# Patient Record
Sex: Male | Born: 1945 | Race: White | Hispanic: No | Smoking: Never smoker
Health system: Southern US, Community
[De-identification: ages and names within clinical notes are randomized; demographics above are authoritative.]

## PROBLEM LIST (undated history)

## (undated) DIAGNOSIS — F419 Anxiety disorder, unspecified: Secondary | ICD-10-CM

## (undated) DIAGNOSIS — Z9229 Personal history of other drug therapy: Secondary | ICD-10-CM

## (undated) DIAGNOSIS — Z8551 Personal history of malignant neoplasm of bladder: Secondary | ICD-10-CM

## (undated) DIAGNOSIS — T8859XA Other complications of anesthesia, initial encounter: Secondary | ICD-10-CM

## (undated) DIAGNOSIS — F32A Depression, unspecified: Secondary | ICD-10-CM

## (undated) DIAGNOSIS — I10 Essential (primary) hypertension: Secondary | ICD-10-CM

## (undated) DIAGNOSIS — Z8546 Personal history of malignant neoplasm of prostate: Secondary | ICD-10-CM

## (undated) HISTORY — DX: Personal history of malignant neoplasm of prostate: Z85.46

## (undated) HISTORY — DX: Personal history of malignant neoplasm of bladder: Z85.51

## (undated) HISTORY — DX: Anxiety disorder, unspecified: F41.9

## (undated) HISTORY — DX: Essential (primary) hypertension: I10

## (undated) HISTORY — PX: SKIN LESION EXCISION: SHX2412

## (undated) HISTORY — DX: Personal history of other drug therapy: Z92.29

## (undated) HISTORY — PX: AORTIC VALVE REPLACEMENT: SHX41

## (undated) HISTORY — DX: Depression, unspecified: F32.A

---
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
## 1978-11-01 HISTORY — PX: KNEE SURGERY: SHX244

## 1980-11-01 HISTORY — PX: KNEE SURGERY: SHX244

## 1981-11-01 HISTORY — PX: BLADDER SURGERY: SHX569

## 1990-11-01 HISTORY — PX: OTHER SURGICAL HISTORY: SHX169

## 2007-11-02 DIAGNOSIS — Z8673 Personal history of transient ischemic attack (TIA), and cerebral infarction without residual deficits: Secondary | ICD-10-CM

## 2007-11-02 HISTORY — DX: Personal history of transient ischemic attack (TIA), and cerebral infarction without residual deficits: Z86.73

## 2010-07-07 DIAGNOSIS — I38 Endocarditis, valve unspecified: Secondary | ICD-10-CM | POA: Insufficient documentation

## 2010-07-07 DIAGNOSIS — E782 Mixed hyperlipidemia: Secondary | ICD-10-CM | POA: Insufficient documentation

## 2010-07-07 DIAGNOSIS — K219 Gastro-esophageal reflux disease without esophagitis: Secondary | ICD-10-CM | POA: Insufficient documentation

## 2010-07-07 DIAGNOSIS — E785 Hyperlipidemia, unspecified: Secondary | ICD-10-CM | POA: Insufficient documentation

## 2011-03-22 DIAGNOSIS — Z7901 Long term (current) use of anticoagulants: Secondary | ICD-10-CM | POA: Insufficient documentation

## 2011-03-22 HISTORY — DX: Long term (current) use of anticoagulants: Z79.01

## 2015-06-30 DIAGNOSIS — J309 Allergic rhinitis, unspecified: Secondary | ICD-10-CM | POA: Insufficient documentation

## 2015-07-23 DIAGNOSIS — Z8673 Personal history of transient ischemic attack (TIA), and cerebral infarction without residual deficits: Secondary | ICD-10-CM | POA: Insufficient documentation

## 2016-09-19 DIAGNOSIS — R1310 Dysphagia, unspecified: Secondary | ICD-10-CM | POA: Insufficient documentation

## 2020-02-07 DIAGNOSIS — N529 Male erectile dysfunction, unspecified: Secondary | ICD-10-CM | POA: Insufficient documentation

## 2020-05-09 DIAGNOSIS — J45909 Unspecified asthma, uncomplicated: Secondary | ICD-10-CM | POA: Insufficient documentation

## 2020-10-27 DIAGNOSIS — F419 Anxiety disorder, unspecified: Secondary | ICD-10-CM | POA: Insufficient documentation

## 2021-03-09 ENCOUNTER — Other Ambulatory Visit: Payer: Self-pay

## 2021-03-09 ENCOUNTER — Ambulatory Visit (INDEPENDENT_AMBULATORY_CARE_PROVIDER_SITE_OTHER): Payer: Medicare Other | Admitting: Nurse Practitioner

## 2021-03-09 ENCOUNTER — Encounter: Payer: Self-pay | Admitting: Nurse Practitioner

## 2021-03-09 VITALS — BP 124/70 | HR 93 | Temp 96.9°F | Ht 67.5 in | Wt 176.0 lb

## 2021-03-09 DIAGNOSIS — Z8546 Personal history of malignant neoplasm of prostate: Secondary | ICD-10-CM

## 2021-03-09 DIAGNOSIS — Z952 Presence of prosthetic heart valve: Secondary | ICD-10-CM

## 2021-03-09 DIAGNOSIS — Z9229 Personal history of other drug therapy: Secondary | ICD-10-CM | POA: Diagnosis not present

## 2021-03-09 DIAGNOSIS — I712 Thoracic aortic aneurysm, without rupture: Secondary | ICD-10-CM

## 2021-03-09 DIAGNOSIS — H9312 Tinnitus, left ear: Secondary | ICD-10-CM

## 2021-03-09 DIAGNOSIS — I2510 Atherosclerotic heart disease of native coronary artery without angina pectoris: Secondary | ICD-10-CM

## 2021-03-09 DIAGNOSIS — Z85820 Personal history of malignant melanoma of skin: Secondary | ICD-10-CM

## 2021-03-09 DIAGNOSIS — I2583 Coronary atherosclerosis due to lipid rich plaque: Secondary | ICD-10-CM

## 2021-03-09 DIAGNOSIS — E782 Mixed hyperlipidemia: Secondary | ICD-10-CM

## 2021-03-09 DIAGNOSIS — Z7901 Long term (current) use of anticoagulants: Secondary | ICD-10-CM | POA: Diagnosis not present

## 2021-03-09 DIAGNOSIS — D509 Iron deficiency anemia, unspecified: Secondary | ICD-10-CM

## 2021-03-09 DIAGNOSIS — F1020 Alcohol dependence, uncomplicated: Secondary | ICD-10-CM

## 2021-03-09 DIAGNOSIS — I7121 Aneurysm of the ascending aorta, without rupture: Secondary | ICD-10-CM

## 2021-03-09 DIAGNOSIS — H9110 Presbycusis, unspecified ear: Secondary | ICD-10-CM

## 2021-03-09 DIAGNOSIS — K219 Gastro-esophageal reflux disease without esophagitis: Secondary | ICD-10-CM

## 2021-03-09 DIAGNOSIS — Z8551 Personal history of malignant neoplasm of bladder: Secondary | ICD-10-CM

## 2021-03-09 LAB — POCT INR: INR: 2.6 (ref 2.0–3.0)

## 2021-03-09 NOTE — Patient Instructions (Addendum)
[facility] Address: [address], [place], [place] [address] [phone]  [facility] in [place], [place] Address: [address], [place], [place] [address] Phone: [phone]
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
## 2021-03-10 ENCOUNTER — Other Ambulatory Visit: Payer: Self-pay | Admitting: *Deleted

## 2021-03-10 MED ORDER — LORAZEPAM 0.5 MG PO TABS: 0.5000 mg | ORAL_TABLET | ORAL | 0 refills | Status: DC

## 2021-03-10 MED ORDER — SERTRALINE HCL 100 MG PO TABS: 100.0000 mg | ORAL_TABLET | Freq: Two times a day (BID) | ORAL | 1 refills | Status: DC

## 2021-03-10 MED ORDER — LOVASTATIN 20 MG PO TABS: 20.0000 mg | ORAL_TABLET | Freq: Every day | ORAL | 1 refills | Status: DC

## 2021-03-10 MED ORDER — RAMIPRIL 10 MG PO CAPS: 10.0000 mg | ORAL_CAPSULE | Freq: Two times a day (BID) | ORAL | 1 refills | Status: DC

## 2021-03-10 MED ORDER — AMLODIPINE BESYLATE 5 MG PO TABS: 5.0000 mg | ORAL_TABLET | Freq: Every day | ORAL | 1 refills | Status: DC

## 2021-03-10 MED ORDER — WARFARIN SODIUM 1 MG PO TABS: 0.5000 mg | ORAL_TABLET | Freq: Every day | ORAL | 1 refills | Status: DC

## 2021-03-10 MED ORDER — WARFARIN SODIUM 2 MG PO TABS: 2.0000 mg | ORAL_TABLET | Freq: Every day | ORAL | 1 refills | Status: DC

## 2021-03-10 MED ORDER — PANTOPRAZOLE SODIUM 40 MG PO TBEC: 40.0000 mg | DELAYED_RELEASE_TABLET | Freq: Two times a day (BID) | ORAL | 1 refills | Status: DC

## 2021-03-10 MED ORDER — BUSPIRONE HCL 5 MG PO TABS: 5.0000 mg | ORAL_TABLET | Freq: Two times a day (BID) | ORAL | 1 refills | Status: DC

## 2021-03-10 MED ORDER — FUROSEMIDE 20 MG PO TABS: 20.0000 mg | ORAL_TABLET | Freq: Every day | ORAL | 1 refills | Status: DC

## 2021-03-10 NOTE — Telephone Encounter (Signed)
Lorazepam Last Refill per bottle is 02/10/2021

## 2021-03-10 NOTE — Telephone Encounter (Signed)
Patient requested refills on ALL medications. Stated that he has moved here from out of town and we are his new Provider.   Pended Rx's and sent to [facility] for approval.

## 2021-03-11 LAB — CBC WITH DIFFERENTIAL/PLATELET
Absolute Monocytes: 549 cells/uL (ref 200–950)
Basophils Absolute: 39 cells/uL (ref 0–200)
Basophils Relative: 0.8 %
Eosinophils Absolute: 338 cells/uL (ref 15–500)
Eosinophils Relative: 6.9 %
HCT: 28.7 % — ABNORMAL LOW (ref 38.5–50.0)
Hemoglobin: 8.6 g/dL — ABNORMAL LOW (ref 13.2–17.1)
Lymphs Abs: 730 cells/uL — ABNORMAL LOW (ref 850–3900)
MCH: 23.7 pg — ABNORMAL LOW (ref 27.0–33.0)
MCHC: 30 g/dL — ABNORMAL LOW (ref 32.0–36.0)
MCV: 79.1 fL — ABNORMAL LOW (ref 80.0–100.0)
MPV: 10.4 fL (ref 7.5–12.5)
Monocytes Relative: 11.2 %
Neutro Abs: 3244 cells/uL (ref 1500–7800)
Neutrophils Relative %: 66.2 %
Platelets: 327 10*3/uL (ref 140–400)
RBC: 3.63 10*6/uL — ABNORMAL LOW (ref 4.20–5.80)
RDW: 15.5 % — ABNORMAL HIGH (ref 11.0–15.0)
Total Lymphocyte: 14.9 %
WBC: 4.9 10*3/uL (ref 3.8–10.8)

## 2021-03-11 LAB — IRON,TIBC AND FERRITIN PANEL
%SAT: 3 % (calc) — ABNORMAL LOW (ref 20–48)
Ferritin: 11 ng/mL — ABNORMAL LOW (ref 24–380)
Iron: 14 ug/dL — ABNORMAL LOW (ref 50–180)
TIBC: 471 mcg/dL (calc) — ABNORMAL HIGH (ref 250–425)

## 2021-03-11 LAB — COMPLETE METABOLIC PANEL WITH GFR
AG Ratio: 1.4 (calc) (ref 1.0–2.5)
ALT: 11 U/L (ref 9–46)
AST: 20 U/L (ref 10–35)
Albumin: 3.6 g/dL (ref 3.6–5.1)
Alkaline phosphatase (APISO): 89 U/L (ref 35–144)
BUN/Creatinine Ratio: 11 (calc) (ref 6–22)
BUN: 15 mg/dL (ref 7–25)
CO2: 26 mmol/L (ref 20–32)
Calcium: 8.7 mg/dL (ref 8.6–10.3)
Chloride: 106 mmol/L (ref 98–110)
Creat: 1.37 mg/dL — ABNORMAL HIGH (ref 0.70–1.18)
GFR, Est African American: 58 mL/min/{1.73_m2} — ABNORMAL LOW (ref >=60)
GFR, Est Non African American: 50 mL/min/{1.73_m2} — ABNORMAL LOW (ref >=60)
Globulin: 2.5 g/dL (calc) (ref 1.9–3.7)
Glucose, Bld: 84 mg/dL (ref 65–139)
Potassium: 4.1 mmol/L (ref 3.5–5.3)
Sodium: 139 mmol/L (ref 135–146)
Total Bilirubin: 0.3 mg/dL (ref 0.2–1.2)
Total Protein: 6.1 g/dL (ref 6.1–8.1)

## 2021-03-11 LAB — TEST AUTHORIZATION

## 2021-03-11 LAB — LIPID PANEL
Cholesterol: 185 mg/dL (ref <200)
HDL: 67 mg/dL (ref >=40)
LDL Cholesterol (Calc): 91 mg/dL (calc)
Non-HDL Cholesterol (Calc): 118 mg/dL (calc) (ref <130)
Total CHOL/HDL Ratio: 2.8 (calc) (ref <5.0)
Triglycerides: 171 mg/dL — ABNORMAL HIGH (ref <150)

## 2021-03-16 ENCOUNTER — Other Ambulatory Visit: Payer: Self-pay

## 2021-03-16 DIAGNOSIS — D509 Iron deficiency anemia, unspecified: Secondary | ICD-10-CM

## 2021-03-16 DIAGNOSIS — R790 Abnormal level of blood mineral: Secondary | ICD-10-CM

## 2021-03-16 MED ORDER — IRON (FERROUS SULFATE) 325 (65 FE) MG PO TABS: 325.0000 mg | ORAL_TABLET | Freq: Two times a day (BID) | ORAL | 3 refills | Status: DC

## 2021-03-27 ENCOUNTER — Observation Stay (HOSPITAL_COMMUNITY): Payer: Medicare Other

## 2021-03-27 ENCOUNTER — Emergency Department (HOSPITAL_COMMUNITY): Payer: Medicare Other

## 2021-03-27 ENCOUNTER — Observation Stay (HOSPITAL_COMMUNITY)
Admission: EM | Admit: 2021-03-27 | Discharge: 2021-03-29 | Disposition: A | Discharge status: Home / Self Care | Payer: Medicare Other | Attending: Emergency Medicine | Admitting: Emergency Medicine

## 2021-03-27 ENCOUNTER — Other Ambulatory Visit: Payer: Self-pay

## 2021-03-27 ENCOUNTER — Encounter (HOSPITAL_COMMUNITY): Payer: Self-pay | Admitting: *Deleted

## 2021-03-27 DIAGNOSIS — K922 Gastrointestinal hemorrhage, unspecified: Secondary | ICD-10-CM | POA: Diagnosis present

## 2021-03-27 DIAGNOSIS — Z79899 Other long term (current) drug therapy: Secondary | ICD-10-CM | POA: Diagnosis not present

## 2021-03-27 DIAGNOSIS — K449 Diaphragmatic hernia without obstruction or gangrene: Secondary | ICD-10-CM | POA: Diagnosis not present

## 2021-03-27 DIAGNOSIS — Z8551 Personal history of malignant neoplasm of bladder: Secondary | ICD-10-CM | POA: Insufficient documentation

## 2021-03-27 DIAGNOSIS — F1020 Alcohol dependence, uncomplicated: Secondary | ICD-10-CM

## 2021-03-27 DIAGNOSIS — I447 Left bundle-branch block, unspecified: Secondary | ICD-10-CM | POA: Diagnosis not present

## 2021-03-27 DIAGNOSIS — S32010A Wedge compression fracture of first lumbar vertebra, initial encounter for closed fracture: Secondary | ICD-10-CM

## 2021-03-27 DIAGNOSIS — D509 Iron deficiency anemia, unspecified: Secondary | ICD-10-CM | POA: Diagnosis not present

## 2021-03-27 DIAGNOSIS — I38 Endocarditis, valve unspecified: Secondary | ICD-10-CM

## 2021-03-27 DIAGNOSIS — S22080A Wedge compression fracture of T11-T12 vertebra, initial encounter for closed fracture: Secondary | ICD-10-CM

## 2021-03-27 DIAGNOSIS — J45909 Unspecified asthma, uncomplicated: Secondary | ICD-10-CM | POA: Diagnosis not present

## 2021-03-27 DIAGNOSIS — R531 Weakness: Secondary | ICD-10-CM

## 2021-03-27 DIAGNOSIS — Z7901 Long term (current) use of anticoagulants: Secondary | ICD-10-CM | POA: Insufficient documentation

## 2021-03-27 DIAGNOSIS — Z20822 Contact with and (suspected) exposure to covid-19: Secondary | ICD-10-CM | POA: Insufficient documentation

## 2021-03-27 DIAGNOSIS — K2100 Gastro-esophageal reflux disease with esophagitis, without bleeding: Secondary | ICD-10-CM | POA: Insufficient documentation

## 2021-03-27 DIAGNOSIS — Z952 Presence of prosthetic heart valve: Secondary | ICD-10-CM

## 2021-03-27 DIAGNOSIS — Z8546 Personal history of malignant neoplasm of prostate: Secondary | ICD-10-CM | POA: Insufficient documentation

## 2021-03-27 DIAGNOSIS — D62 Acute posthemorrhagic anemia: Secondary | ICD-10-CM

## 2021-03-27 DIAGNOSIS — K3189 Other diseases of stomach and duodenum: Secondary | ICD-10-CM | POA: Insufficient documentation

## 2021-03-27 DIAGNOSIS — I10 Essential (primary) hypertension: Secondary | ICD-10-CM | POA: Diagnosis not present

## 2021-03-27 DIAGNOSIS — K317 Polyp of stomach and duodenum: Secondary | ICD-10-CM | POA: Diagnosis not present

## 2021-03-27 DIAGNOSIS — I700 Atherosclerosis of aorta: Secondary | ICD-10-CM

## 2021-03-27 HISTORY — DX: Other complications of anesthesia, initial encounter: T88.59XA

## 2021-03-27 HISTORY — DX: Presence of prosthetic heart valve: Z95.2

## 2021-03-27 LAB — URINALYSIS, ROUTINE W REFLEX MICROSCOPIC
Bilirubin Urine: NEGATIVE
Glucose, UA: NEGATIVE mg/dL
Hgb urine dipstick: NEGATIVE
Ketones, ur: NEGATIVE mg/dL
Leukocytes,Ua: NEGATIVE
Nitrite: NEGATIVE
Protein, ur: NEGATIVE mg/dL
Specific Gravity, Urine: 1.005 (ref 1.005–1.030)
pH: 6 (ref 5.0–8.0)

## 2021-03-27 LAB — CBC WITH DIFFERENTIAL/PLATELET
Abs Immature Granulocytes: 0.03 10*3/uL (ref 0.00–0.07)
Basophils Absolute: 0.1 10*3/uL (ref 0.0–0.1)
Basophils Relative: 1 %
Eosinophils Absolute: 0.4 10*3/uL (ref 0.0–0.5)
Eosinophils Relative: 5 %
HCT: 26.5 % — ABNORMAL LOW (ref 39.0–52.0)
Hemoglobin: 7.7 g/dL — ABNORMAL LOW (ref 13.0–17.0)
Immature Granulocytes: 0 %
Lymphocytes Relative: 10 %
Lymphs Abs: 0.9 10*3/uL (ref 0.7–4.0)
MCH: 22.4 pg — ABNORMAL LOW (ref 26.0–34.0)
MCHC: 29.1 g/dL — ABNORMAL LOW (ref 30.0–36.0)
MCV: 77.3 fL — ABNORMAL LOW (ref 80.0–100.0)
Monocytes Absolute: 0.8 10*3/uL (ref 0.1–1.0)
Monocytes Relative: 9 %
Neutro Abs: 6.7 10*3/uL (ref 1.7–7.7)
Neutrophils Relative %: 75 %
Platelets: 302 10*3/uL (ref 150–400)
RBC: 3.43 MIL/uL — ABNORMAL LOW (ref 4.22–5.81)
RDW: 16.8 % — ABNORMAL HIGH (ref 11.5–15.5)
WBC: 8.9 10*3/uL (ref 4.0–10.5)
nRBC: 0 % (ref 0.0–0.2)

## 2021-03-27 LAB — COMPREHENSIVE METABOLIC PANEL
ALT: 10 U/L (ref 0–44)
AST: 17 U/L (ref 15–41)
Albumin: 3.3 g/dL — ABNORMAL LOW (ref 3.5–5.0)
Alkaline Phosphatase: 78 U/L (ref 38–126)
Anion gap: 5 (ref 5–15)
BUN: 20 mg/dL (ref 8–23)
CO2: 25 mmol/L (ref 22–32)
Calcium: 8.9 mg/dL (ref 8.9–10.3)
Chloride: 107 mmol/L (ref 98–111)
Creatinine, Ser: 1.37 mg/dL — ABNORMAL HIGH (ref 0.61–1.24)
GFR, Estimated: 54 mL/min — ABNORMAL LOW (ref >60)
Glucose, Bld: 104 mg/dL — ABNORMAL HIGH (ref 70–99)
Potassium: 4.1 mmol/L (ref 3.5–5.1)
Sodium: 137 mmol/L (ref 135–145)
Total Bilirubin: 0.5 mg/dL (ref 0.3–1.2)
Total Protein: 6.7 g/dL (ref 6.5–8.1)

## 2021-03-27 LAB — PROTIME-INR
INR: 1.3 — ABNORMAL HIGH (ref 0.8–1.2)
Prothrombin Time: 15.9 seconds — ABNORMAL HIGH (ref 11.4–15.2)

## 2021-03-27 LAB — RESP PANEL BY RT-PCR (FLU A&B, COVID) ARPGX2
Influenza A by PCR: NEGATIVE
Influenza B by PCR: NEGATIVE
SARS Coronavirus 2 by RT PCR: NEGATIVE

## 2021-03-27 LAB — POC OCCULT BLOOD, ED: Fecal Occult Bld: NEGATIVE

## 2021-03-27 MED ORDER — ACETAMINOPHEN 650 MG RE SUPP: 650.0000 mg | Freq: Four times a day (QID) | RECTAL | Status: DC | PRN

## 2021-03-27 MED ORDER — ACETAMINOPHEN 325 MG PO TABS
650.0000 mg | ORAL_TABLET | Freq: Four times a day (QID) | ORAL | Status: DC | PRN
  Filled 2021-03-27: qty 2

## 2021-03-27 MED ORDER — ONDANSETRON HCL 4 MG/2ML IJ SOLN: 4.0000 mg | Freq: Four times a day (QID) | INTRAMUSCULAR | Status: DC | PRN

## 2021-03-27 MED ORDER — SODIUM CHLORIDE 0.9 % IV SOLN
80.0000 mg | Freq: Once | INTRAVENOUS | Status: AC
  Administered 2021-03-27: 80 mg via INTRAVENOUS
  Filled 2021-03-27: qty 80

## 2021-03-27 MED ORDER — SODIUM CHLORIDE 0.9 % IV SOLN: INTRAVENOUS | Status: AC

## 2021-03-27 MED ORDER — THIAMINE HCL 100 MG PO TABS
100.0000 mg | ORAL_TABLET | Freq: Every day | ORAL | Status: DC
  Administered 2021-03-28 – 2021-03-29 (×2): 100 mg via ORAL
  Filled 2021-03-27 (×2): qty 1

## 2021-03-27 MED ORDER — ADULT MULTIVITAMIN W/MINERALS CH
1.0000 | ORAL_TABLET | Freq: Every day | ORAL | Status: DC
  Administered 2021-03-27 – 2021-03-29 (×3): 1 via ORAL
  Filled 2021-03-27 (×3): qty 1

## 2021-03-27 MED ORDER — LORAZEPAM 2 MG/ML IJ SOLN
1.0000 mg | INTRAMUSCULAR | Status: DC | PRN
  Administered 2021-03-27 – 2021-03-28 (×2): 1 mg via INTRAVENOUS
  Filled 2021-03-27 (×2): qty 1

## 2021-03-27 MED ORDER — THIAMINE HCL 100 MG/ML IJ SOLN
100.0000 mg | Freq: Every day | INTRAMUSCULAR | Status: DC
  Administered 2021-03-27: 100 mg via INTRAVENOUS
  Filled 2021-03-27: qty 2

## 2021-03-27 MED ORDER — PANTOPRAZOLE SODIUM 40 MG IV SOLR
8.0000 mg/h | INTRAVENOUS | Status: DC
  Administered 2021-03-27 – 2021-03-29 (×3): 8 mg/h via INTRAVENOUS
  Filled 2021-03-27 (×6): qty 80

## 2021-03-27 MED ORDER — FOLIC ACID 1 MG PO TABS
1.0000 mg | ORAL_TABLET | Freq: Every day | ORAL | Status: DC
  Administered 2021-03-27 – 2021-03-29 (×3): 1 mg via ORAL
  Filled 2021-03-27 (×3): qty 1

## 2021-03-27 MED ORDER — ONDANSETRON HCL 4 MG/2ML IJ SOLN
4.0000 mg | Freq: Once | INTRAMUSCULAR | Status: AC
Start: 2021-03-27 — End: 2021-03-27
  Administered 2021-03-27: 4 mg via INTRAVENOUS
  Filled 2021-03-27: qty 2

## 2021-03-27 MED ORDER — LORAZEPAM 1 MG PO TABS: 1.0000 mg | ORAL_TABLET | ORAL | Status: DC | PRN

## 2021-03-27 NOTE — ED Notes (Signed)
Pt given a note from family member that was left up front. Pt now upset after reading note as it stated that his family was unable to come see him per instructions from [facility]. This nurse contacted pts family member [name] to have her speak with pt as he was stating " Im not going to stay in the hospital if [facility] wont let my person come see me. They're the only one I have." Nurse tried to reassure pt and had phone call transferred in to the room.

## 2021-03-27 NOTE — ED Triage Notes (Signed)
75 yo male BIBA from facility [facility] for ETOH on Monday. Per EMS facility did blood work that showed low hbg/ hct. Per Ems facility states pts hbg level was 6.6, pt has history of blood transfusions. Pt currently exhibiting fatigue and n/v. No other complaints at this time. Pt is AOx4.   Vitals: 116/72 Hr 100 rr 16 spo2 98% ra cbg 176 gcs 15

## 2021-03-27 NOTE — ED Notes (Signed)
Pt transported to CT ?

## 2021-03-27 NOTE — ED Notes (Signed)
Pt ambulated to restroom without assistance.

## 2021-03-27 NOTE — Consult Note (Signed)
Referring Provider: [facility] Primary Care Physician:  [name], NP Primary Gastroenterologist: None (unassigned).  Has been seen previously by GI physicians in [place].  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  Notably, stool was Hemoccult negative in the emergency room.  The patient indicates he had a gastric mass resected many years ago in [place], apparently causing bleeding at that time.  He also has a history of "bleeding ulcer) but it sounds like that is actually the mass mentioned above that was bleeding.  He has had mild dyspeptic symptoms recently and uses occasional antacids.  As mentioned, he has a tendency for nocturnal regurgitation without antecedent nausea.  He really has not had nausea and vomiting.  He is maintained on Coumadin because of a history of stroke, although he was actually subtherapeutic with an essentially normal INR at the time of presentation to the emergency room today.  There is no known history of liver disease and the patient does not have any abnormal labs to suggest underlying liver disease.  The patient's CT scan today shows a normal liver and normal spleen without ascites or varices.  It did, however, show a moderate sized hiatal hernia.  The patient indicates he had a colonoscopy in [place] about 5 years ago, apparently without any significant findings.  He does not recall any specific follow-up interval being recommended  subsequent to that exam.  No problem with anorexia or progressive weight loss.  He does admit to mild intermittent dysphagia symptoms but no history of prolonged food impactions.  No lower tract symptoms, no history of dark stools or rectal bleeding.   Past Medical History:  Diagnosis Date  . Anxiety   . Depression   . History of bladder cancer   . History of pneumonia 1959  . History of prostate cancer   . History of scarlet fever 1956  . History of stroke 2009  . Hx of long term use of blood thinners   . Hypertension     Past Surgical History:  Procedure Laterality Date  . [place]   Bladder Cancer Surgery   . KNEE SURGERY Right 1980  . KNEE SURGERY Left 1982  . OTHER SURGICAL HISTORY  1992   stomach muscles removed due to Coumadin caused bleeding.  . TONSILLECTOMY  1965    Prior to Admission medications   Medication Sig Start Date End Date Taking? Authorizing Provider  acetaminophen (TYLENOL) 500 MG tablet Take 500 mg by mouth as needed.    [provider]  amLODipine (NORVASC) 5 MG tablet Take 1 tablet (5 mg total) by mouth daily. 03/10/21   [name], NP  busPIRone (BUSPAR) 5 MG tablet Take 1 tablet (5 mg total) by mouth 2 (two) times daily. 03/10/21   [name], NP  cetirizine (ZYRTEC) 10 MG tablet Take 10 mg by mouth daily.    [provider]  furosemide (LASIX) 20 MG tablet Take 1 tablet (20 mg total) by mouth daily. 03/10/21  [name], NP  Iron, Ferrous Sulfate, 325 (65 Fe) MG TABS Take 325 mg by mouth in the morning and at bedtime. 03/16/21   [name], NP  LORazepam (ATIVAN) 0.5 MG tablet Take 1 tablet (0.5 mg total) by mouth as directed. One tablet in morning, and two tablets at night. 03/10/21   [name], NP  lovastatin (MEVACOR) 20 MG tablet Take 1 tablet (20 mg total) by mouth at bedtime. 03/10/21   [name], NP  pantoprazole (PROTONIX) 40 MG tablet Take 1 tablet (40 mg total) by  mouth 2 (two) times daily. 03/10/21   [name], NP  ramipril (ALTACE) 10 MG capsule Take 1 capsule (10 mg total) by mouth 2 (two) times daily. 03/10/21   [name], NP  sertraline (ZOLOFT) 100 MG tablet Take 1 tablet (100 mg total) by mouth 2 (two) times daily. 03/10/21   [name], NP  warfarin (COUMADIN) 1 MG tablet Take 0.5 tablets (0.5 mg total) by mouth daily. 03/10/21   [name], NP  warfarin (COUMADIN) 2 MG tablet Take 1 tablet (2 mg total) by mouth daily. Along with 1/2 of 1 mg tablet for a total of 2.5 mg daily 03/10/21   [name], NP    Current Facility-Administered Medications  Medication Dose Route Frequency Provider Last Rate Last Admin  . pantoprazole (PROTONIX) 80 mg in sodium chloride 0.9 % 100 mL (0.8 mg/mL) infusion  8 mg/hr Intravenous Continuous Couture, Cortni S, PA-C 10 mL/hr at 03/27/21 1602 8 mg/hr at 03/27/21 1602   Current Outpatient Medications  Medication Sig Dispense Refill  . acetaminophen (TYLENOL) 500 MG tablet Take 500 mg by mouth as needed.    Marland Kitchen amLODipine (NORVASC) 5 MG tablet Take 1 tablet (5 mg total) by mouth daily. 90 tablet 1  . busPIRone (BUSPAR) 5 MG tablet Take 1 tablet (5 mg total) by mouth 2 (two) times daily. 180 tablet 1  . cetirizine (ZYRTEC) 10 MG tablet Take 10 mg by mouth daily.    . furosemide (LASIX) 20 MG tablet Take 1 tablet (20 mg total) by mouth daily. 90 tablet 1  . Iron, Ferrous Sulfate, 325 (65 Fe) MG TABS Take 325 mg by mouth in the morning and at bedtime. 180 tablet 3  . LORazepam (ATIVAN) 0.5 MG tablet Take 1 tablet (0.5 mg total) by mouth as directed. One tablet in morning, and two tablets at night. 90 tablet 0  . lovastatin (MEVACOR) 20 MG tablet Take 1 tablet (20 mg total) by mouth at bedtime. 90 tablet 1  . pantoprazole (PROTONIX) 40 MG tablet Take 1 tablet (40 mg total) by mouth 2 (two) times daily. 180 tablet 1  . ramipril (ALTACE) 10 MG capsule Take 1 capsule (10 mg total) by mouth 2  (two) times daily. 180 capsule 1  . sertraline (ZOLOFT) 100 MG tablet Take 1 tablet (100 mg total) by mouth 2 (two) times daily. 180 tablet 1  . warfarin (COUMADIN) 1 MG tablet Take 0.5 tablets (0.5 mg total) by mouth daily. 45 tablet 1  . warfarin (COUMADIN) 2 MG tablet Take 1 tablet (2 mg total) by mouth daily. Along with 1/2 of 1 mg tablet for a total of 2.5 mg daily 90 tablet 1    Allergies as of 03/27/2021 - Review Complete 03/27/2021  Allergen Reaction Noted  . Aspirin  10/13/2017  . Morphine and related Other (See Comments) 10/13/2017  . Promethazine Other (See Comments) 10/13/2017  History reviewed. No pertinent family history.  Social History   Socioeconomic History  . Marital status: Widowed    Spouse name: Not on file  . Number of children: Not on file  . Years of education: Not on file  . Highest education level: Not on file  Occupational History  . Not on file  Tobacco Use  . Smoking status: Never Smoker  . Smokeless tobacco: Current User    Types: Snuff  . Tobacco comment: 4-5 pouches a day  Vaping Use  . Vaping Use: Never used  Substance and Sexual Activity  . Alcohol use: Yes    Comment: 15-20 drinks per week.  . Drug use: Never  . Sexual activity: Not on file  Other Topics Concern  . Not on file  Social History Narrative   Tobacco use, amount per day now: Dip 4 plugs per day.   Past tobacco use, amount per day:   How many years did you use tobacco: 40   Alcohol use (drinks per week): 15-25 drinks.   Diet: poor   Do you drink/eat things with caffeine: Yes   Marital status: Widowed.                                 What year were you married? 1973   Do you live in a house, apartment, assisted living, condo, trailer, etc.? House   Is it one or more stories? One   How many persons live in your home? Three   Do you have pets in your home?( please list) No   Highest Level of education completed? PhD Candidate.   Current or past profession: Tourist information centre manager,  Principle, and Leisure centre manager.   Do you exercise?  No                                Type and how often?   Do you have a living will? Yes   Do you have a DNR form? Yes                                  If not, do you want to discuss one?   Do you have signed POA/HPOA forms?  Yes                      If so, please bring to you appointment      Do you have any difficulty bathing or dressing yourself?    Do you have any difficulty preparing food or eating?   Do you have any difficulty managing your medications?   Do you have any difficulty managing your finances?   Do you have any difficulty affording your medications?    Social Determinants of Health   Financial Resource Strain: Not on file  Food Insecurity: Not on file  Transportation Needs: Not on file  Physical Activity: Not on file  Stress: Not on file  Social Connections: Not on file  Intimate Partner Violence: Not on file    Review of Systems: See HPI  Physical Exam: Vital signs in last 24 hours: Temp:  [99.1 F (37.3 C)] 99.1 F (37.3 C) (05/27 1439) Pulse Rate:  [75-95] 87 (05/27 1900) Resp:  [16-21] 17 (05/27 1900) BP: (101-139)/(54-92) 132/69 (05/27 1900) SpO2:  [92 %-100 %] 100 % (  05/27 1900)   Physical exam is pertinent for the absence of stigmata of chronic liver disease such as scleral icterus, palmar erythema, spider angiomata, hepatosplenomegaly, evidence of ascites, or peripheral edema.  Mental status is normal, mood normal.  Chest clear, heart without murmur or arrhythmia, abdomen without mass or tenderness although a well-healed midline incision without ventral herniation is present.  No focal neurologic abnormalities evident.   Intake/Output from previous day: No intake/output data recorded. Intake/Output this shift: No intake/output data recorded.  Lab Results: Recent Labs    03/27/21 1532  WBC 8.9  HGB 7.7*  HCT 26.5*  PLT 302   BMET Recent Labs    03/27/21 1532  NA 137  K 4.1  CL 107  CO2 25   GLUCOSE 104*  BUN 20  CREATININE 1.37*  CALCIUM 8.9   LFT Recent Labs    03/27/21 1532  PROT 6.7  ALBUMIN 3.3*  AST 17  ALT 10  ALKPHOS 78  BILITOT 0.5   PT/INR Recent Labs    03/27/21 1532  LABPROT 15.9*  INR 1.3*    Studies/Results: CT ABDOMEN PELVIS WO CONTRAST  Result Date: 03/27/2021 CLINICAL DATA:  Low hemoglobin, hematocrit, history of blood transfusions, fatigue and nausea vomiting EXAM: CT ABDOMEN AND PELVIS WITHOUT CONTRAST TECHNIQUE: Multidetector CT imaging of the abdomen and pelvis was performed following the standard protocol without IV contrast. COMPARISON:  None. FINDINGS: Lower chest: Few clustered tree-in-bud opacities are seen in the right lower lobe (4/32) and right middle lobe (4/1). Calcified granuloma in the left lung base (4/19). Top-normal cardiac size. Calcifications of the mitral annulus and chordae tendinae. Hypoattenuation of the cardiac blood pool may reflect a relative anemia compatible patient's low hemoglobin/hematocrit. Atherosclerotic calcifications of the coronary arteries. Hepatobiliary: Punctate calcifications noted within the hepatic parenchyma. Smooth surface contour. No concerning focal liver lesion. Likely physiologic distension of the gallbladder. No pericholecystic fluid or inflammation. No visible calcified gallstones or biliary ductal dilatation. Pancreas: No pancreatic ductal dilatation or surrounding inflammatory changes. Spleen: Punctate calcifications throughout the spleen. Splenic size is within normal limits. No concerning focal splenic lesion. Adrenals/Urinary Tract: Normal adrenals. Kidneys are symmetric in size and normally located. Tiny subcentimeter 5 mm rounded subcapsular focus on the anterior interpolar left kidney (2/33) too small to fully characterize though statistically likely benign. No obstructive urolithiasis or hydronephrosis. Mild bilateral perinephric stranding is nonspecific particularly given some mild bladder wall  thickening. Keyhole defect at the level of the bladder base may reflect prior TURP or other postsurgical change. Stomach/Bowel: Moderate hiatal hernia despite postsurgical changes at the diaphragmatic hiatus likely reflecting prior hernia surgery. Surgical clips noted towards the gastric antrum and upper midline abdomen as well. Stomach and duodenum are otherwise unremarkable. No small bowel thickening or dilatation accounting for underdistention. Appendix is not visualized. No focal inflammation the vicinity of the cecum to suggest an occult appendicitis. Diffuse pancolonic and terminal ileal diverticulosis. Focally thickened segment of sigmoid colon is noted without significant colonic stranding (5/74). In a region of numerous diverticula. No evidence of bowel obstruction. Vascular/Lymphatic: Atherosclerotic calcifications within the abdominal aorta and branch vessels. No aneurysm or ectasia. No enlarged abdominopelvic lymph nodes. Reproductive: Keyhole defect at the bladder base/prostate, correlate with postsurgical change. Prostate seminal vesicles are otherwise. Other: Fat containing bilateral inguinal hernias, left greater than right. No frank bowel containing hernia though the left inguinal hernia is closely apposed by proximal sigmoid. No free intraperitoneal air or fluid. Postsurgical changes from prior vertical midline incision. Musculoskeletal: Remote  appearing compression deformities T12 (50% height loss) and L1 (20% height loss). Multilevel degenerative changes are present in the imaged portions of the spine. Additional degenerative changes in the hips and pelvis. IMPRESSION: 1. Focally thickened segment of sigmoid colon in a region of several diverticula but without acute inflammation. Could reflect sequela of prior infection/inflammation though warrants further evaluation with direct visualization is underlying mass lesion is not fully excluded. 2. Additional diverticulosis throughout the terminal  ileum and colon. 3. Few clustered tree-in-bud opacities are present in the right middle and lower lobe, could reflect acute infection or inflammation including sequela of aspiration in the appropriate clinical setting. 4. Mild bilateral perinephric stranding and some mild bladder wall thickening, recommend correlation with urinalysis to exclude ascending tract infection. 5. Remote appearing compression deformities of T12, L1, though could correlate for local tenderness to exclude acuity. 6. Hypoattenuating cardiac blood pool compatible with anemia. 7. Evidence of prior granulomatous disease in the chest and abdomen. 8. Keyhole defect at the level of the bladder base/prostate, correlate with prior surgical history. 9. Moderate hiatal hernia despite surgical changes near the diaphragmatic hiatus. 10.  Aortic Atherosclerosis (ICD10-I70.0). Electronically Signed   By: Lovena Le M.D.   On: 03/27/2021 18:26    Impression: 1.  Regurgitation of dark material, questionable coffee-ground material.  No frank hematemesis. 2.  History of gastric mass with bleeding, status post resection many years ago in [place] 3.  Alcohol abuse, without clinical, radiographic, or biochemical evidence of significant liver disease 4.  Severe microcytic anemia with documented iron deficiency 2 weeks ago, status post colonoscopy (results unknown) 5 years ago in [place]; hemoglobin has dropped slightly from 8.6 on May 9th to 7.7 today. 5.  Chronic anticoagulation, presumably for history of stroke, although current INR is normal at 1.3 6.  Sigmoid thickening consistent with diverticular disease.  Reportedly up-to-date on colonoscopy, done about 5 years ago in [place].  Discussion: This sounds to me like chronic reflux disease.  I would not be surprised if this patient, who has a moderate sized hiatal hernia, and stopped having significant reflux esophagitis which might account for his coffee-ground emesis and/or anemia.   Alcoholic gastritis would be another possibility.  Doubt portal hypertension, varices, or portal gastropathy in view of normal platelet count and absence of stigmata of chronic liver disease and negative radiographic findings.  Plan: 1.  Agree with Protonix infusion   2.  Endoscopic evaluation tomorrow.  Petra Kuba, purpose, and risks reviewed and patient agreeable.  3.  Eventually, this patient might need colonoscopic evaluation in view of his documented iron deficiency anemia, although the Hemoccult negative stool would go against a colonic source, as would his relatively recent colonoscopy.   LOS: 0 days   Youlanda Mighty Rosa [place]  03/27/2021, 8:32 PM   Pager [phone] If no answer or after 5 PM call [phone]

## 2021-03-27 NOTE — H&P (Signed)
History and Physical    Garrett Terry UTM:[id number] DOB: 1946-10-01 DOA: 03/27/2021  PCP: [name], NP Patient coming from: [facility]  Chief Complaint: Anemia, coffee-ground emesis  HPI: Garrett Terry is a 75 y.o. 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  Patient complained of coffee-ground emesis.  In the ED, he was hemodynamically stable.  Labs showing WBC 8.9, hemoglobin 7.7, hematocrit 26.5, MCV 77.3, platelet count 302K.  Sodium 137, potassium 4.1, chloride 107, bicarb 25, BUN 20, creatinine 1.3 (at baseline), glucose 104.  INR 1.3.  FOBT negative.  Screening COVID and influenza PCR pending.  CT did not show obvious source of bleeding.  Showing focally thickened segment of sigmoid colon in the region of several diverticula but without acute inflammation.  Radiologist feels that this could reflect sequela of prior infection/inflammation though warrants further evaluation with direct visualization as underlying mass lesion is not fully excluded.  Abdominal ultrasound was done in the ED and results pending. GI Dr. Cristina Gong consulted and recommended admission for observation.  Patient was given IV Protonix bolus and started on continuous infusion.  Patient states for the past 4 days he has had 3 episodes of dark coffee-ground emesis.  This usually happens at night and he feels a lot of acid reflux.  Reports chronic bilateral lower quadrant abdominal pain which has been going on close to a year.  Denies hematemesis or melena.  Reports fatigue.  States he had an endoscopy done in another state several years ago and is not sure about the  results.  He was also previously told that he had an abnormality in his colon but does not remember what exactly it was.  He is fully vaccinated against COVID including booster shot.  Reports chronic shortness of breath and wheezing for which he uses an inhaler, no recent change.  Denies cough.  Denies chest pain.  Review of Systems:  All systems reviewed and apart from history of presenting illness, are negative.  Past Medical History:  Diagnosis Date  . Anxiety   . Depression   . History of bladder cancer   . History of pneumonia 1959  . History of prostate cancer   . History of scarlet fever 1956  . History of stroke 2009  . Hx of long term use of blood thinners   . Hypertension     Past Surgical History:  Procedure Laterality Date  . [place]   Bladder Cancer Surgery   . KNEE SURGERY Right 1980  . KNEE SURGERY Left 1982  . OTHER SURGICAL HISTORY  1992   stomach muscles removed due to Coumadin caused bleeding.  . TONSILLECTOMY  1965     reports that he has never smoked. His smokeless tobacco use includes snuff. He reports current alcohol use. He reports that he does not use drugs.  Allergies  Allergen Reactions  . Aspirin     Other reaction(s): Other (See Comments) Can't take due to taking blood thinners  . Morphine And Related Other (See Comments)    Other reaction(s): Other (See Comments) Hallucinations hallucinations   . Promethazine Other (See Comments)    Other reaction(s): Other (See Comments) Hallucinations hallucinations     History reviewed. No  pertinent family history.  Prior to Admission medications   Medication Sig Start Date End Date Taking? Authorizing Provider  acetaminophen (TYLENOL) 500 MG tablet Take 500 mg by mouth as needed.    [provider]  amLODipine (NORVASC) 5 MG tablet Take 1 tablet (5 mg total) by mouth daily. 03/10/21   [name], NP  busPIRone (BUSPAR) 5 MG tablet Take 1 tablet (5 mg total) by mouth 2  (two) times daily. 03/10/21   [name], NP  cetirizine (ZYRTEC) 10 MG tablet Take 10 mg by mouth daily.    [provider]  furosemide (LASIX) 20 MG tablet Take 1 tablet (20 mg total) by mouth daily. 03/10/21   [name], NP  Iron, Ferrous Sulfate, 325 (65 Fe) MG TABS Take 325 mg by mouth in the morning and at bedtime. 03/16/21   [name], NP  LORazepam (ATIVAN) 0.5 MG tablet Take 1 tablet (0.5 mg total) by mouth as directed. One tablet in morning, and two tablets at night. 03/10/21   [name], NP  lovastatin (MEVACOR) 20 MG tablet Take 1 tablet (20 mg total) by mouth at bedtime. 03/10/21   [name], NP  pantoprazole (PROTONIX) 40 MG tablet Take 1 tablet (40 mg total) by mouth 2 (two) times daily. 03/10/21   [name], NP  ramipril (ALTACE) 10 MG capsule Take 1 capsule (10 mg total) by mouth 2 (two) times daily. 03/10/21   [name], NP  sertraline (ZOLOFT) 100 MG tablet Take 1 tablet (100 mg total) by mouth 2 (two) times daily. 03/10/21   [name], NP  warfarin (COUMADIN) 1 MG tablet Take 0.5 tablets (0.5 mg total) by mouth daily. 03/10/21   [name], NP  warfarin (COUMADIN) 2 MG tablet Take 1 tablet (2 mg total) by mouth daily. Along with 1/2 of 1 mg tablet for a total of 2.5 mg daily 03/10/21   [name], NP    Physical Exam: Vitals:   03/27/21 1700 03/27/21 1730 03/27/21 1835 03/27/21 1900  BP: (!) 101/57 (!) 139/54 130/67 132/69  Pulse: 84 75 95 87  Resp: 20 18 18 17   Temp:      TempSrc:      SpO2: 100% 92% 100% 100%   Physical Exam Constitutional:      General: He is not in acute distress. HENT:     Head: Normocephalic and atraumatic.  Eyes:     Extraocular Movements: Extraocular movements intact.     Conjunctiva/sclera: Conjunctivae normal.  Cardiovascular:     Rate and Rhythm: Normal rate and regular rhythm.     Pulses: Normal pulses.  Pulmonary:     Effort: Pulmonary effort is  normal. No respiratory distress.     Breath sounds: Normal breath sounds. No wheezing or rales.  Abdominal:     General: Bowel sounds are normal. There is no distension.     Palpations: Abdomen is soft.     Tenderness: There is no abdominal tenderness.  Musculoskeletal:        General: No swelling or tenderness.     Cervical back: Normal range of motion and neck supple.  Skin:    General: Skin is warm and dry.  Neurological:     General: No focal deficit present.     Mental Status: He is alert and oriented to person, place, and time.     Labs on Admission: I have personally reviewed following labs and  imaging studies  CBC: Recent Labs  Lab 03/27/21 1532  WBC 8.9  NEUTROABS 6.7  HGB 7.7*  HCT 26.5*  MCV 77.3*  PLT 353   Basic Metabolic Panel: Recent Labs  Lab 03/27/21 1532  NA 137  K 4.1  CL 107  CO2 25  GLUCOSE 104*  BUN 20  CREATININE 1.37*  CALCIUM 8.9   GFR: CrCl cannot be calculated (Unknown ideal weight.). Liver Function Tests: Recent Labs  Lab 03/27/21 1532  AST 17  ALT 10  ALKPHOS 78  BILITOT 0.5  PROT 6.7  ALBUMIN 3.3*   No results for input(s): LIPASE, AMYLASE in the last 168 hours. No results for input(s): AMMONIA in the last 168 hours. Coagulation Profile: Recent Labs  Lab 03/27/21 1532  INR 1.3*   Cardiac Enzymes: No results for input(s): CKTOTAL, CKMB, CKMBINDEX, TROPONINI in the last 168 hours. BNP (last 3 results) No results for input(s): PROBNP in the last 8760 hours. HbA1C: No results for input(s): HGBA1C in the last 72 hours. CBG: No results for input(s): GLUCAP in the last 168 hours. Lipid Profile: No results for input(s): CHOL, HDL, LDLCALC, TRIG, CHOLHDL, LDLDIRECT in the last 72 hours. Thyroid Function Tests: No results for input(s): TSH, T4TOTAL, FREET4, T3FREE, THYROIDAB in the last 72 hours. Anemia Panel: No results for input(s): VITAMINB12, FOLATE, FERRITIN, TIBC, IRON, RETICCTPCT in the last 72 hours. Urine  analysis:    Component Value Date/Time   COLORURINE COLORLESS (A) 03/27/2021 1901   APPEARANCEUR CLEAR 03/27/2021 1901   LABSPEC 1.005 03/27/2021 1901   PHURINE 6.0 03/27/2021 1901   GLUCOSEU NEGATIVE 03/27/2021 1901   HGBUR NEGATIVE 03/27/2021 1901   BILIRUBINUR NEGATIVE 03/27/2021 1901   KETONESUR NEGATIVE 03/27/2021 1901   PROTEINUR NEGATIVE 03/27/2021 1901   NITRITE NEGATIVE 03/27/2021 1901   LEUKOCYTESUR NEGATIVE 03/27/2021 1901    Radiological Exams on Admission: CT ABDOMEN PELVIS WO CONTRAST  Result Date: 03/27/2021 CLINICAL DATA:  Low hemoglobin, hematocrit, history of blood transfusions, fatigue and nausea vomiting EXAM: CT ABDOMEN AND PELVIS WITHOUT CONTRAST TECHNIQUE: Multidetector CT imaging of the abdomen and pelvis was performed following the standard protocol without IV contrast. COMPARISON:  None. FINDINGS: Lower chest: Few clustered tree-in-bud opacities are seen in the right lower lobe (4/32) and right middle lobe (4/1). Calcified granuloma in the left lung base (4/19). Top-normal cardiac size. Calcifications of the mitral annulus and chordae tendinae. Hypoattenuation of the cardiac blood pool may reflect a relative anemia compatible patient's low hemoglobin/hematocrit. Atherosclerotic calcifications of the coronary arteries. Hepatobiliary: Punctate calcifications noted within the hepatic parenchyma. Smooth surface contour. No concerning focal liver lesion. Likely physiologic distension of the gallbladder. No pericholecystic fluid or inflammation. No visible calcified gallstones or biliary ductal dilatation. Pancreas: No pancreatic ductal dilatation or surrounding inflammatory changes. Spleen: Punctate calcifications throughout the spleen. Splenic size is within normal limits. No concerning focal splenic lesion. Adrenals/Urinary Tract: Normal adrenals. Kidneys are symmetric in size and normally located. Tiny subcentimeter 5 mm rounded subcapsular focus on the anterior interpolar  left kidney (2/33) too small to fully characterize though statistically likely benign. No obstructive urolithiasis or hydronephrosis. Mild bilateral perinephric stranding is nonspecific particularly given some mild bladder wall thickening. Keyhole defect at the level of the bladder base may reflect prior TURP or other postsurgical change. Stomach/Bowel: Moderate hiatal hernia despite postsurgical changes at the diaphragmatic hiatus likely reflecting prior hernia surgery. Surgical clips noted towards the gastric antrum and upper midline abdomen as well. Stomach and duodenum are otherwise unremarkable. No  small bowel thickening or dilatation accounting for underdistention. Appendix is not visualized. No focal inflammation the vicinity of the cecum to suggest an occult appendicitis. Diffuse pancolonic and terminal ileal diverticulosis. Focally thickened segment of sigmoid colon is noted without significant colonic stranding (5/74). In a region of numerous diverticula. No evidence of bowel obstruction. Vascular/Lymphatic: Atherosclerotic calcifications within the abdominal aorta and branch vessels. No aneurysm or ectasia. No enlarged abdominopelvic lymph nodes. Reproductive: Keyhole defect at the bladder base/prostate, correlate with postsurgical change. Prostate seminal vesicles are otherwise. Other: Fat containing bilateral inguinal hernias, left greater than right. No frank bowel containing hernia though the left inguinal hernia is closely apposed by proximal sigmoid. No free intraperitoneal air or fluid. Postsurgical changes from prior vertical midline incision. Musculoskeletal: Remote appearing compression deformities T12 (50% height loss) and L1 (20% height loss). Multilevel degenerative changes are present in the imaged portions of the spine. Additional degenerative changes in the hips and pelvis. IMPRESSION: 1. Focally thickened segment of sigmoid colon in a region of several diverticula but without acute  inflammation. Could reflect sequela of prior infection/inflammation though warrants further evaluation with direct visualization is underlying mass lesion is not fully excluded. 2. Additional diverticulosis throughout the terminal ileum and colon. 3. Few clustered tree-in-bud opacities are present in the right middle and lower lobe, could reflect acute infection or inflammation including sequela of aspiration in the appropriate clinical setting. 4. Mild bilateral perinephric stranding and some mild bladder wall thickening, recommend correlation with urinalysis to exclude ascending tract infection. 5. Remote appearing compression deformities of T12, L1, though could correlate for local tenderness to exclude acuity. 6. Hypoattenuating cardiac blood pool compatible with anemia. 7. Evidence of prior granulomatous disease in the chest and abdomen. 8. Keyhole defect at the level of the bladder base/prostate, correlate with prior surgical history. 9. Moderate hiatal hernia despite surgical changes near the diaphragmatic hiatus. 10.  Aortic Atherosclerosis (ICD10-I70.0). Electronically Signed   By: Lovena Le M.D.   On: 03/27/2021 18:26    EKG: Independently reviewed.  Sinus rhythm, LBBB.  No prior tracing for comparison.  Assessment/Plan Principal Problem:   GI bleed Active Problems:   Acute blood loss anemia   LBBB (left bundle branch block)   History of aortic valve replacement   Alcohol dependence ([place])   Acute blood loss anemia secondary to suspected upper GI bleed Patient is here for evaluation of coffee-ground emesis and worsening anemia.  Hemodynamically stable and no hematemesis since he has been in the ED.  FOBT negative.  Hemoglobin currently 7.7, was 8.6 on 03/09/2021 but per care everywhere it was ranging between 11.8-12.0 last year.  He has a known history of alcohol dependence and is on Coumadin due to history of aortic valve replacement.  No prior EGD results in the chart.  CT done in the ED  without obvious source of bleeding. -GI consulted.  Keep n.p.o. Continue IV PPI.  IV fluid hydration.  Hold home Coumadin.  Antiemetic as needed.  Type and screen.  Serial H&H, transfuse if hemoglobin less than 7.  Monitor hemodynamics closely.  Colon abnormality seen on CT CT showing focally thickened segment of sigmoid colon in the region of several diverticula but without acute inflammation.  Radiologist feels that this could reflect sequela of prior infection/inflammation though warrants further evaluation with direct visualization as underlying mass lesion is not fully excluded.  -GI consulted  Left bundle branch block Seen on EKG but there are no prior tracings for comparison.  Patient not  endorsing chest pain. -Check high-sensitivity troponin level and order echocardiogram.  Aortic valve replacement On Coumadin and INR subtherapeutic at 1.3. -Hold Coumadin given concern for acute GI bleed.  Alcohol dependence -CIWA protocol; Ativan as needed.  Thiamine, folate, and multivitamin.  Check mag and Phos levels.  DVT prophylaxis: SCDs Code Status: Patient wishes to be DNR. Family Communication: No family at bedside. Disposition Plan: Status is: Observation  The patient remains OBS appropriate and will d/c before 2 midnights.  Dispo: The patient is from: Rehab facility              Anticipated d/c is to: Rehab facility              Patient currently is not medically stable to d/c.   Difficult to place patient No  Level of care: Level of care: Telemetry   The medical decision making on this patient was of high complexity and the patient is at high risk for clinical deterioration, therefore this is a level 3 visit.  Shela Leff MD [facility]  If 7PM-7AM, please contact night-coverage [email or web address]  03/27/2021, 8:51 PM

## 2021-03-27 NOTE — ED Notes (Signed)
Patient's contact and [facility] updated on patient care

## 2021-03-27 NOTE — ED Notes (Signed)
Ultrasound at pt bedside

## 2021-03-27 NOTE — ED Provider Notes (Signed)
[facility] DEPT Provider Note   CSN: [id number] Arrival date & time: 03/27/21  1420     History Chief Complaint  Patient presents with  . Fatigue    Garrett Terry is a 75 y.o. male.  HPI   Pt is a 75 y/o male with a h/o anxiety, depression, bladder cancer, pneumonia, prostate cancer, scarlet fever, CVA, long-term use of anticoagulation s/p valve replacement, hld, htn,, EtOH use, who presents to the ED today for eval of anemia. States he has been feeling fatigued for about 6 weeks. Denies chest pain or sob. Denies any bloody or tarry stools. He has had nv for the last 3 days and states the vomit has been dark and looks like coffee grounds. He denies any current abd pain but has had intermittent abd pain for the last 3 weeks. This pain is located to the periumbilical area.  He admits to long history of EtOH use and is currently receiving treatment at [facility].  He had his lab work checked recently and on paperwork at bedside his hemoglobin was 6.6.  He was sent here for further evaluation.  Past Medical History:  Diagnosis Date  . Anxiety   . Depression   . History of bladder cancer   . History of pneumonia 1959  . History of prostate cancer   . History of scarlet fever 1956  . History of stroke 2009  . Hx of long term use of blood thinners   . Hypertension     Patient Active Problem List   Diagnosis Date Noted  . GI bleed 03/27/2021  . Acute blood loss anemia 03/27/2021  . LBBB (left bundle branch block) 03/27/2021  . History of aortic valve replacement 03/27/2021  . Alcohol dependence ([place]) 03/27/2021  . Anxiety 10/27/2020  . Asthma 05/09/2020  . Erectile dysfunction 02/07/2020  . Dysphagia 09/19/2016  . History of cerebrovascular accident 07/23/2015  . Allergic rhinitis 06/30/2015  . Long term current use of anticoagulant 03/22/2011  . Gastroesophageal reflux disease 07/07/2010  . Hyperlipidemia 07/07/2010  . Heart valve  disorder 07/07/2010    Past Surgical History:  Procedure Laterality Date  . [place]   Bladder Cancer Surgery   . KNEE SURGERY Right 1980  . KNEE SURGERY Left 1982  . OTHER SURGICAL HISTORY  1992   stomach muscles removed due to Coumadin caused bleeding.  . TONSILLECTOMY  1965       History reviewed. No pertinent family history.  Social History   Tobacco Use  . Smoking status: Never Smoker  . Smokeless tobacco: Current User    Types: Snuff  . Tobacco comment: 4-5 pouches a day  Vaping Use  . Vaping Use: Never used  Substance Use Topics  . Alcohol use: Yes    Comment: 15-20 drinks per week.  . Drug use: Never    Home Medications Prior to Admission medications   Medication Sig Start Date End Date Taking? Authorizing Provider  acetaminophen (TYLENOL) 500 MG tablet Take 500 mg by mouth daily as needed for moderate pain.   Yes [provider]  albuterol (VENTOLIN HFA) 108 (90 Base) MCG/ACT inhaler Inhale 1 puff into the lungs every 6 (six) hours as needed for wheezing or shortness of breath.   Yes [provider]  amLODipine (NORVASC) 5 MG tablet Take 1 tablet (5 mg total) by mouth daily. 03/10/21  Yes [name], NP  busPIRone (BUSPAR) 5 MG tablet Take 1 tablet (5 mg  total) by mouth 2 (two) times daily. 03/10/21  Yes [name], NP  cetirizine (ZYRTEC) 10 MG tablet Take 10 mg by mouth daily.   Yes [provider]  furosemide (LASIX) 20 MG tablet Take 1 tablet (20 mg total) by mouth daily. 03/10/21  Yes [name], NP  lovastatin (MEVACOR) 40 MG tablet Take 40 mg by mouth at bedtime.   Yes [provider]  Multiple Vitamin (MULTIVITAMIN WITH MINERALS) TABS tablet Take 1 tablet by mouth daily.   Yes [provider]  pantoprazole (PROTONIX) 40 MG tablet Take 1 tablet (40 mg total) by mouth 2 (two) times daily. 03/10/21  Yes [name], NP  ramipril (ALTACE) 10 MG capsule Take 1 capsule (10 mg  total) by mouth 2 (two) times daily. Patient taking differently: Take 20 mg by mouth daily. 03/10/21  Yes [name], NP  sertraline (ZOLOFT) 100 MG tablet Take 1 tablet (100 mg total) by mouth 2 (two) times daily. 03/10/21  Yes [name], NP  thiamine 100 MG tablet Take 100 mg by mouth daily.   Yes [provider]  traZODone (DESYREL) 150 MG tablet Take 150 mg by mouth at bedtime.   Yes [provider]  warfarin (COUMADIN) 1 MG tablet Take 0.5 tablets (0.5 mg total) by mouth daily. 03/10/21  Yes [name], NP  warfarin (COUMADIN) 2 MG tablet Take 1 tablet (2 mg total) by mouth daily. Along with 1/2 of 1 mg tablet for a total of 2.5 mg daily 03/10/21  Yes [name], NP  Iron, Ferrous Sulfate, 325 (65 Fe) MG TABS Take 325 mg by mouth in the morning and at bedtime. Patient not taking: No sig reported 03/16/21   [name], NP  LORazepam (ATIVAN) 0.5 MG tablet Take 1 tablet (0.5 mg total) by mouth as directed. One tablet in morning, and two tablets at night. Patient taking differently: Take 0.5 mg by mouth as directed. One tablet by mouth 3 times a day (tapering) 03/10/21   [name], NP  lovastatin (MEVACOR) 20 MG tablet Take 1 tablet (20 mg total) by mouth at bedtime. Patient not taking: No sig reported 03/10/21   [name], NP    Allergies    Aspirin, Morphine and related, and Promethazine  Review of Systems   Review of Systems  Constitutional: Positive for fatigue. Negative for chills and fever.  HENT: Negative for ear pain and sore throat.   Eyes: Negative for visual disturbance.  Respiratory: Negative for cough and shortness of breath.   Cardiovascular: Negative for chest pain.  Gastrointestinal: Positive for abdominal pain, nausea and vomiting. Negative for constipation and diarrhea.  Genitourinary: Negative for dysuria and hematuria.  Musculoskeletal: Negative for back pain.  Skin: Negative for rash.   Neurological: Positive for weakness (generalized weakness). Negative for headaches.  All other systems reviewed and are negative.   Physical Exam Updated Vital Signs BP 120/73   Pulse 85   Temp 99.1 F (37.3 C) (Oral)   Resp 16   SpO2 100%   Physical Exam Vitals and nursing note reviewed.  Constitutional:      Appearance: He is well-developed.  HENT:     Head: Normocephalic and atraumatic.  Eyes:     Conjunctiva/sclera: Conjunctivae normal.  Cardiovascular:     Rate and Rhythm: Normal rate and regular rhythm.     Heart sounds: Normal heart sounds. No murmur heard.   Pulmonary:  Effort: Pulmonary effort is normal. No respiratory distress.     Breath sounds: Normal breath sounds. No wheezing, rhonchi or rales.  Abdominal:     General: Bowel sounds are normal.     Palpations: Abdomen is soft.     Tenderness: There is abdominal tenderness (periumbilical ttp). There is no guarding or rebound.  Musculoskeletal:     Cervical back: Neck supple.  Skin:    General: Skin is warm and dry.  Neurological:     Mental Status: He is alert.     ED Results / Procedures / Treatments   Labs (all labs ordered are listed, but only abnormal results are displayed) Labs Reviewed  COMPREHENSIVE METABOLIC PANEL - Abnormal; Notable for the following components:      Result Value   Glucose, Bld 104 (*)    Creatinine, Ser 1.37 (*)    Albumin 3.3 (*)    GFR, Estimated 54 (*)    All other components within normal limits  CBC WITH DIFFERENTIAL/PLATELET - Abnormal; Notable for the following components:   RBC 3.43 (*)    Hemoglobin 7.7 (*)    HCT 26.5 (*)    MCV 77.3 (*)    MCH 22.4 (*)    MCHC 29.1 (*)    RDW 16.8 (*)    All other components within normal limits  PROTIME-INR - Abnormal; Notable for the following components:   Prothrombin Time 15.9 (*)    INR 1.3 (*)    All other components within normal limits  URINALYSIS, ROUTINE W REFLEX MICROSCOPIC - Abnormal; Notable for the  following components:   Color, Urine COLORLESS (*)    All other components within normal limits  RESP PANEL BY RT-PCR (FLU A&B, COVID) ARPGX2  MAGNESIUM  PHOSPHORUS  HEMOGLOBIN AND HEMATOCRIT, BLOOD  HEMOGLOBIN AND HEMATOCRIT, BLOOD  POC OCCULT BLOOD, ED  TYPE AND SCREEN  ABO/RH  TROPONIN I (HIGH SENSITIVITY)    EKG None  Radiology CT ABDOMEN PELVIS WO CONTRAST  Result Date: 03/27/2021 CLINICAL DATA:  Low hemoglobin, hematocrit, history of blood transfusions, fatigue and nausea vomiting EXAM: CT ABDOMEN AND PELVIS WITHOUT CONTRAST TECHNIQUE: Multidetector CT imaging of the abdomen and pelvis was performed following the standard protocol without IV contrast. COMPARISON:  None. FINDINGS: Lower chest: Few clustered tree-in-bud opacities are seen in the right lower lobe (4/32) and right middle lobe (4/1). Calcified granuloma in the left lung base (4/19). Top-normal cardiac size. Calcifications of the mitral annulus and chordae tendinae. Hypoattenuation of the cardiac blood pool may reflect a relative anemia compatible patient's low hemoglobin/hematocrit. Atherosclerotic calcifications of the coronary arteries. Hepatobiliary: Punctate calcifications noted within the hepatic parenchyma. Smooth surface contour. No concerning focal liver lesion. Likely physiologic distension of the gallbladder. No pericholecystic fluid or inflammation. No visible calcified gallstones or biliary ductal dilatation. Pancreas: No pancreatic ductal dilatation or surrounding inflammatory changes. Spleen: Punctate calcifications throughout the spleen. Splenic size is within normal limits. No concerning focal splenic lesion. Adrenals/Urinary Tract: Normal adrenals. Kidneys are symmetric in size and normally located. Tiny subcentimeter 5 mm rounded subcapsular focus on the anterior interpolar left kidney (2/33) too small to fully characterize though statistically likely benign. No obstructive urolithiasis or hydronephrosis. Mild  bilateral perinephric stranding is nonspecific particularly given some mild bladder wall thickening. Keyhole defect at the level of the bladder base may reflect prior TURP or other postsurgical change. Stomach/Bowel: Moderate hiatal hernia despite postsurgical changes at the diaphragmatic hiatus likely reflecting prior hernia surgery. Surgical clips noted towards the gastric  antrum and upper midline abdomen as well. Stomach and duodenum are otherwise unremarkable. No small bowel thickening or dilatation accounting for underdistention. Appendix is not visualized. No focal inflammation the vicinity of the cecum to suggest an occult appendicitis. Diffuse pancolonic and terminal ileal diverticulosis. Focally thickened segment of sigmoid colon is noted without significant colonic stranding (5/74). In a region of numerous diverticula. No evidence of bowel obstruction. Vascular/Lymphatic: Atherosclerotic calcifications within the abdominal aorta and branch vessels. No aneurysm or ectasia. No enlarged abdominopelvic lymph nodes. Reproductive: Keyhole defect at the bladder base/prostate, correlate with postsurgical change. Prostate seminal vesicles are otherwise. Other: Fat containing bilateral inguinal hernias, left greater than right. No frank bowel containing hernia though the left inguinal hernia is closely apposed by proximal sigmoid. No free intraperitoneal air or fluid. Postsurgical changes from prior vertical midline incision. Musculoskeletal: Remote appearing compression deformities T12 (50% height loss) and L1 (20% height loss). Multilevel degenerative changes are present in the imaged portions of the spine. Additional degenerative changes in the hips and pelvis. IMPRESSION: 1. Focally thickened segment of sigmoid colon in a region of several diverticula but without acute inflammation. Could reflect sequela of prior infection/inflammation though warrants further evaluation with direct visualization is underlying  mass lesion is not fully excluded. 2. Additional diverticulosis throughout the terminal ileum and colon. 3. Few clustered tree-in-bud opacities are present in the right middle and lower lobe, could reflect acute infection or inflammation including sequela of aspiration in the appropriate clinical setting. 4. Mild bilateral perinephric stranding and some mild bladder wall thickening, recommend correlation with urinalysis to exclude ascending tract infection. 5. Remote appearing compression deformities of T12, L1, though could correlate for local tenderness to exclude acuity. 6. Hypoattenuating cardiac blood pool compatible with anemia. 7. Evidence of prior granulomatous disease in the chest and abdomen. 8. Keyhole defect at the level of the bladder base/prostate, correlate with prior surgical history. 9. Moderate hiatal hernia despite surgical changes near the diaphragmatic hiatus. 10.  Aortic Atherosclerosis (ICD10-I70.0). Electronically Signed   By: Lovena Le M.D.   On: 03/27/2021 18:26   US Abdomen Complete  Result Date: 03/27/2021 CLINICAL DATA:  Anemia, gastrointestinal bleeding EXAM: ABDOMEN ULTRASOUND COMPLETE COMPARISON:  03/27/2021 FINDINGS: Gallbladder: No gallstones or wall thickening visualized. No sonographic Murphy sign noted by sonographer. Common bile duct: Diameter: 5 mm Liver: No focal lesion identified. Within normal limits in parenchymal echogenicity. Portal vein is patent on color Doppler imaging with normal direction of blood flow towards the liver. IVC: Not well visualized. Pancreas: Visualized portions of the head and body are unremarkable. Tail is obscured by bowel gas. Spleen: Grossly normal in size. 2.4 x 1.9 x 2.1 cm hyperechoic focus within the inferior aspect of the splenic hilum could reflect a small splenic hemangioma. No other focal abnormalities. Right Kidney: Length: 10.9 cm. Echogenicity within normal limits. 17 mm peripelvic cyst. No hydronephrosis or nephrolithiasis. Left  Kidney: Length: 10.3 cm. Normal echogenicity. 9 mm cortical cyst midpole. No hydronephrosis or nephrolithiasis. Abdominal aorta: No aneurysm visualized. Other findings: None. IMPRESSION: 1. Small bilateral renal cysts. 2. Indeterminate 2.4 cm hyperechoic focus inferior splenic hilum, favor hemangioma. 3. Limited visualization of the IVC and pancreas due to bowel gas. Electronically Signed   By: Randa Ngo M.D.   On: 03/27/2021 21:14    Procedures Procedures   Medications Ordered in ED Medications  pantoprazole (PROTONIX) 80 mg in sodium chloride 0.9 % 100 mL (0.8 mg/mL) infusion (8 mg/hr Intravenous New Bag/Given 03/27/21 1602)  ondansetron (ZOFRAN)  injection 4 mg (has no administration in time range)  acetaminophen (TYLENOL) tablet 650 mg (has no administration in time range)    Or  acetaminophen (TYLENOL) suppository 650 mg (has no administration in time range)  0.9 %  sodium chloride infusion ( Intravenous New Bag/Given 03/27/21 2131)  LORazepam (ATIVAN) tablet 1-4 mg ( Oral See Alternative 03/27/21 2138)    Or  LORazepam (ATIVAN) injection 1-4 mg (1 mg Intravenous Given 03/27/21 2138)  thiamine tablet 100 mg ( Oral See Alternative 03/27/21 2138)    Or  thiamine (B-1) injection 100 mg (100 mg Intravenous Given 4/78/29 5621)  folic acid (FOLVITE) tablet 1 mg (1 mg Oral Given 03/27/21 2132)  multivitamin with minerals tablet 1 tablet (1 tablet Oral Given 03/27/21 2132)  pantoprazole (PROTONIX) 80 mg in sodium chloride 0.9 % 100 mL IVPB (0 mg Intravenous Stopped 03/27/21 1837)  ondansetron (ZOFRAN) injection 4 mg (4 mg Intravenous Given 03/27/21 1539)    ED Course  I have reviewed the triage vital signs and the nursing notes.  Pertinent labs & imaging results that were available during my care of the patient were reviewed by me and considered in my medical decision making (see chart for details).    MDM Rules/Calculators/A&P                          76 year old male presenting to the  emergency department today for evaluation of anemia that was noted at his rehab facility prior to arrival.  Has been having dark emesis for the last 3 days.  History of EtOH use and peptic ulcer disease.  Reviewed/interpreted labs CBC with anemia  - pt w/o hx cad, will hold on transfusion at this time and trend CMP grossly unremarkable Hemoccult neg INR subtherapeutic   CT abd/pelvis - 1. Focally thickened segment of sigmoid colon in a region of several diverticula but without acute inflammation. Could reflect sequela of prior infection/inflammation though warrants further evaluation with direct visualization is underlying mass lesion is not fully Excluded. 2. Additional diverticulosis throughout the terminal ileum and Colon. 3. Few clustered tree-in-bud opacities are present in the right middle and lower lobe, could reflect acute infection or inflammation including sequela of aspiration in the appropriate clinical setting. 4. Mild bilateral perinephric stranding and some mild bladder wall thickening, recommend correlation with urinalysis to exclude ascending tract infection. 5. Remote appearing compression deformities of T12, L1, though could correlate for local tenderness to exclude acuity. 6. Hypoattenuating cardiac blood pool compatible with anemia. 7. Evidence of prior granulomatous disease in the chest and abdomen. 8. Keyhole defect at the level of the bladder base/prostate, correlate with prior surgical history. 9. Moderate hiatal hernia despite surgical changes near the diaphragmatic hiatus. 76.  Aortic Atherosclerosis   75 year old male with a history of EtOH use, PUD, presenting with multiple episodes of dark emesis, there is concern for possible GI bleed as patient who is currently anticoagulated.  Will discuss case with GI  4:49 PM discussed case with Dr. Cristina Gong.  He is in agreement with the plan for admission.  He agrees with the plan to give PPI and recommends a complete abdominal  ultrasound.  He will evaluate the patient and discuss the need for endoscopy.  7:33 PM CONSULT With Dr. Neysa Bonito who accepts care of the patient  7:47 PM consult with Dr. Ezekiel Ina who states she will be admitting the patient at this time  Final Clinical Impression(s) / ED Diagnoses Final diagnoses:  Weakness    Rx / DC Orders ED Discharge Orders    None       Bishop Dublin 03/27/21 2217    Arnaldo Natal, MD 03/27/21 2328

## 2021-03-28 ENCOUNTER — Observation Stay (HOSPITAL_COMMUNITY): Payer: Medicare Other | Admitting: Certified Registered Nurse Anesthetist

## 2021-03-28 ENCOUNTER — Encounter (HOSPITAL_COMMUNITY): Payer: Self-pay | Admitting: Internal Medicine

## 2021-03-28 ENCOUNTER — Other Ambulatory Visit (HOSPITAL_COMMUNITY): Payer: Commercial Managed Care - PPO

## 2021-03-28 ENCOUNTER — Encounter (HOSPITAL_COMMUNITY)
Admission: EM | Disposition: A | Discharge status: Home / Self Care | Payer: Self-pay | Source: Home / Self Care | Attending: Emergency Medicine

## 2021-03-28 DIAGNOSIS — D509 Iron deficiency anemia, unspecified: Secondary | ICD-10-CM

## 2021-03-28 DIAGNOSIS — S22080A Wedge compression fracture of T11-T12 vertebra, initial encounter for closed fracture: Secondary | ICD-10-CM

## 2021-03-28 DIAGNOSIS — I700 Atherosclerosis of aorta: Secondary | ICD-10-CM

## 2021-03-28 DIAGNOSIS — Z20822 Contact with and (suspected) exposure to covid-19: Secondary | ICD-10-CM | POA: Diagnosis not present

## 2021-03-28 DIAGNOSIS — D62 Acute posthemorrhagic anemia: Secondary | ICD-10-CM

## 2021-03-28 DIAGNOSIS — S32010A Wedge compression fracture of first lumbar vertebra, initial encounter for closed fracture: Secondary | ICD-10-CM | POA: Diagnosis not present

## 2021-03-28 DIAGNOSIS — K2100 Gastro-esophageal reflux disease with esophagitis, without bleeding: Secondary | ICD-10-CM | POA: Diagnosis not present

## 2021-03-28 DIAGNOSIS — Z952 Presence of prosthetic heart valve: Secondary | ICD-10-CM

## 2021-03-28 DIAGNOSIS — K922 Gastrointestinal hemorrhage, unspecified: Secondary | ICD-10-CM | POA: Diagnosis not present

## 2021-03-28 DIAGNOSIS — J45909 Unspecified asthma, uncomplicated: Secondary | ICD-10-CM | POA: Diagnosis not present

## 2021-03-28 DIAGNOSIS — K317 Polyp of stomach and duodenum: Secondary | ICD-10-CM | POA: Diagnosis not present

## 2021-03-28 HISTORY — DX: Atherosclerosis of aorta: I70.0

## 2021-03-28 HISTORY — PX: ESOPHAGOGASTRODUODENOSCOPY (EGD) WITH PROPOFOL: SHX5813

## 2021-03-28 HISTORY — DX: Wedge compression fracture of T11-T12 vertebra, initial encounter for closed fracture: S22.080A

## 2021-03-28 HISTORY — DX: Wedge compression fracture of first lumbar vertebra, initial encounter for closed fracture: S32.010A

## 2021-03-28 HISTORY — PX: BIOPSY: SHX5522

## 2021-03-28 LAB — ABO/RH: ABO/RH(D): O POS

## 2021-03-28 LAB — CBC
HCT: 22.7 % — ABNORMAL LOW (ref 39.0–52.0)
Hemoglobin: 6.5 g/dL — CL (ref 13.0–17.0)
MCH: 22.2 pg — ABNORMAL LOW (ref 26.0–34.0)
MCHC: 28.6 g/dL — ABNORMAL LOW (ref 30.0–36.0)
MCV: 77.5 fL — ABNORMAL LOW (ref 80.0–100.0)
Platelets: 249 10*3/uL (ref 150–400)
RBC: 2.93 MIL/uL — ABNORMAL LOW (ref 4.22–5.81)
RDW: 16.4 % — ABNORMAL HIGH (ref 11.5–15.5)
WBC: 5.1 10*3/uL (ref 4.0–10.5)
nRBC: 0 % (ref 0.0–0.2)

## 2021-03-28 LAB — PHOSPHORUS: Phosphorus: 3.2 mg/dL (ref 2.5–4.6)

## 2021-03-28 LAB — PREPARE RBC (CROSSMATCH)

## 2021-03-28 LAB — BASIC METABOLIC PANEL
Anion gap: 3 — ABNORMAL LOW (ref 5–15)
BUN: 16 mg/dL (ref 8–23)
CO2: 26 mmol/L (ref 22–32)
Calcium: 8.1 mg/dL — ABNORMAL LOW (ref 8.9–10.3)
Chloride: 109 mmol/L (ref 98–111)
Creatinine, Ser: 1.3 mg/dL — ABNORMAL HIGH (ref 0.61–1.24)
GFR, Estimated: 58 mL/min — ABNORMAL LOW (ref >60)
Glucose, Bld: 94 mg/dL (ref 70–99)
Potassium: 4.2 mmol/L (ref 3.5–5.1)
Sodium: 138 mmol/L (ref 135–145)

## 2021-03-28 LAB — MAGNESIUM: Magnesium: 1.7 mg/dL (ref 1.7–2.4)

## 2021-03-28 SURGERY — ESOPHAGOGASTRODUODENOSCOPY (EGD) WITH PROPOFOL
Anesthesia: Monitor Anesthesia Care

## 2021-03-28 MED ORDER — PROPOFOL 500 MG/50ML IV EMUL
INTRAVENOUS | Status: DC | PRN
  Administered 2021-03-28: 120 ug/kg/min via INTRAVENOUS

## 2021-03-28 MED ORDER — LACTATED RINGERS IV SOLN: INTRAVENOUS | Status: DC | PRN

## 2021-03-28 MED ORDER — HEPARIN (PORCINE) 25000 UT/250ML-% IV SOLN
1350.0000 [IU]/h | INTRAVENOUS | Status: DC
  Administered 2021-03-28: 1100 [IU]/h via INTRAVENOUS
  Administered 2021-03-29: 1350 [IU]/h via INTRAVENOUS
  Filled 2021-03-28 (×2): qty 250

## 2021-03-28 MED ORDER — SODIUM CHLORIDE 0.9% IV SOLUTION: Freq: Once | INTRAVENOUS | Status: AC

## 2021-03-28 MED ORDER — MELATONIN 3 MG PO TABS
3.0000 mg | ORAL_TABLET | Freq: Every evening | ORAL | Status: DC | PRN
  Administered 2021-03-28: 3 mg via ORAL
  Filled 2021-03-28: qty 1

## 2021-03-28 MED ORDER — LACTATED RINGERS IV SOLN
INTRAVENOUS | Status: AC | PRN
  Administered 2021-03-28: 10 mL/h via INTRAVENOUS

## 2021-03-28 MED ORDER — PROPOFOL 10 MG/ML IV BOLUS
INTRAVENOUS | Status: DC | PRN
  Administered 2021-03-28 (×4): 20 mg via INTRAVENOUS

## 2021-03-28 MED ORDER — LIDOCAINE HCL (CARDIAC) PF 100 MG/5ML IV SOSY
PREFILLED_SYRINGE | INTRAVENOUS | Status: DC | PRN
  Administered 2021-03-28: 100 mg via INTRAVENOUS

## 2021-03-28 MED ORDER — SODIUM CHLORIDE 0.9 % IV SOLN: INTRAVENOUS | Status: DC

## 2021-03-28 SURGICAL SUPPLY — 14 items

## 2021-03-28 NOTE — Anesthesia Preprocedure Evaluation (Signed)
Anesthesia Evaluation  Patient identified by MRN, date of birth, ID band Patient awake    Reviewed: Allergy & Precautions, NPO status , Patient's Chart, lab work & pertinent test results  History of Anesthesia Complications (+) PONV  Airway Mallampati: II  TM Distance: >3 FB Neck ROM: Full    Dental  (+) Teeth Intact   Pulmonary asthma , COPD,  COPD inhaler,    Pulmonary exam normal        Cardiovascular hypertension, Pt. on medications + CAD  + Valvular Problems/Murmurs (s/p AVR on Coumadin)  Rhythm:Regular Rate:Normal     Neuro/Psych Anxiety Depression CVA (2009)    GI/Hepatic GERD  Medicated,(+)     substance abuse  alcohol use, Coffee ground emesis   Endo/Other  negative endocrine ROS  Renal/GU negative Renal ROS   Prostate Ca    Musculoskeletal negative musculoskeletal ROS (+)   Abdominal (+)  Abdomen: soft. Bowel sounds: normal.  Peds  Hematology  (+) anemia ,   Anesthesia Other Findings   Reproductive/Obstetrics                             Anesthesia Physical Anesthesia Plan  ASA: III  Anesthesia Plan: MAC   Post-op Pain Management:    Induction: Intravenous  PONV Risk Score and Plan: 2 and Propofol infusion and Treatment may vary due to age or medical condition  Airway Management Planned: Simple Face Mask, Natural Airway and Nasal Cannula  Additional Equipment: None  Intra-op Plan:   Post-operative Plan:   Informed Consent: I have reviewed the patients History and Physical, chart, labs and discussed the procedure including the risks, benefits and alternatives for the proposed anesthesia with the patient or authorized representative who has indicated his/her understanding and acceptance.     Dental advisory given  Plan Discussed with: CRNA  Anesthesia Plan Comments: (Lab Results      Component                Value               Date                       WBC                      8.9                 03/27/2021                HGB                      7.7 (L)             03/27/2021                HCT                      26.5 (L)            03/27/2021                MCV                      77.3 (L)            03/27/2021                PLT  302                 03/27/2021           Lab Results      Component                Value               Date                      NA                       137                 03/27/2021                K                        4.1                 03/27/2021                CO2                      25                  03/27/2021                GLUCOSE                  104 (H)             03/27/2021                BUN                      20                  03/27/2021                CREATININE               1.37 (H)            03/27/2021                CALCIUM                  8.9                 03/27/2021                GFRNONAA                 54 (L)              03/27/2021                GFRAA                    58 (L)              03/09/2021          )        Anesthesia Quick Evaluation

## 2021-03-28 NOTE — Anesthesia Postprocedure Evaluation (Signed)
Anesthesia Post Note  Patient: Tenzin Edelman  Procedure(s) Performed: ESOPHAGOGASTRODUODENOSCOPY (EGD) WITH PROPOFOL (N/A ) BIOPSY     Patient location during evaluation: Endoscopy Anesthesia Type: MAC Level of consciousness: awake and alert Pain management: pain level controlled Vital Signs Assessment: post-procedure vital signs reviewed and stable Respiratory status: spontaneous breathing, nonlabored ventilation, respiratory function stable and patient connected to nasal cannula oxygen Cardiovascular status: stable and blood pressure returned to baseline Postop Assessment: no apparent nausea or vomiting Anesthetic complications: no   No complications documented.  Last Vitals:  Vitals:   03/28/21 1630 03/28/21 1700  BP: 110/64 138/74  Pulse: 69 80  Resp: 16   Temp: 36.7 C   SpO2: 100% 100%    Last Pain:  Vitals:   03/28/21 1630  TempSrc: Oral  PainSc:                  March Rummage Rehaan Viloria

## 2021-03-28 NOTE — Progress Notes (Signed)
[place] for IV heparin Indication: mech AVR  Allergies  Allergen Reactions  . Aspirin     Other reaction(s): Other (See Comments) Can't take due to taking blood thinners  . Morphine And Related Other (See Comments)    Other reaction(s): Other (See Comments) Hallucinations    . Promethazine Other (See Comments)    Other reaction(s): Other (See Comments) Hallucinations     Patient Measurements: Height: 5' 7.5" (171.5 cm) Weight: 78 kg (172 lb) IBW/kg (Calculated) : 67.25 Heparin Dosing Weight: TBW  Vital Signs: Temp: 97.8 F (36.6 C) (05/28 1020) Temp Source: Oral (05/28 1020) BP: 109/64 (05/28 1400) Pulse Rate: 70 (05/28 1400)  Labs: Recent Labs    03/27/21 1532 03/28/21 0905  HGB 7.7* 6.5*  HCT 26.5* 22.7*  PLT 302 249  LABPROT 15.9*  --   INR 1.3*  --   CREATININE 1.37* 1.30*    Estimated Creatinine Clearance: 47.5 mL/min (A) (by C-G formula based on SCr of 1.3 mg/dL (H)).   Medical History: Past Medical History:  Diagnosis Date  . Anxiety   . Aortic atherosclerosis ([place]) 03/28/2021  . Closed compression fracture of body of L1 vertebra ([place]) 03/28/2021  . Complication of anesthesia    PONV  . Compression fracture of T12 vertebra ([place]) 03/28/2021  . Depression   . History of bladder cancer   . History of pneumonia 1959  . History of prostate cancer   . History of scarlet fever 1956  . History of stroke 2009  . Hx of long term use of blood thinners   . Hypertension   . Long term current use of anticoagulant 03/22/2011  . 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 sodium chloride   Intravenous Once  . folic acid  1 mg Oral Daily  . multivitamin with minerals  1 tablet Oral Daily  . thiamine  100 mg Oral Daily   Or  . 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 Warfarin held on admission; Pharmacy to dose IV heparin for AVR while INR subtherapeutic.    Baseline INR low; surprising given no missed doses and admit w/ UGIB  Prior anticoagulation: warfarin 2.5 mg daily; LD 5/26  INR goal 2.5-3.5 per patient and most recent PCP notes  Significant events:  Today, 03/28/2021:  CBC: Hgb low on admission; now below 7 today; receiving PRBC; Plt stable WNL  Minimal SCr history; but appears to be at baseline (~1.3)  No bleeding or infusion issues per nursing  Per GI notes, suspect "coffee-ground emesis" more likely nocturnal regurgitation based on EGD findings of no bleeding or varices (also notes BUN decreased rather than increased with dropping Hgb)  Goal of Therapy: Heparin level 0.3-0.7 units/ml Monitor platelets by anticoagulation protocol: Yes  Plan:  Heparin 1100 units/hr IV infusion  Check heparin level 8 hrs after start  Daily CBC, daily heparin level once stable  Monitor for signs of bleeding or thrombosis  Reuel Boom, PharmD, BCPS [phone] 03/28/2021, 4:07 PM

## 2021-03-28 NOTE — Interval H&P Note (Signed)
History and Physical Interval Note:  03/28/2021 9:50 AM  Garrett Terry  has presented today for surgery, with the diagnosis of iron deficiency anemia.  The various methods of treatment have been discussed with the patient. After consideration of risks, benefits and other options for treatment, the patient has consented to  Procedure(s): ESOPHAGOGASTRODUODENOSCOPY (EGD) WITH PROPOFOL (N/A) as a surgical intervention.  The patient's history has been reviewed, patient examined, no change in status, stable for surgery.  I have reviewed the patient's chart and labs.  Questions were answered to the patient's satisfaction.     Garrett Terry

## 2021-03-28 NOTE — Progress Notes (Signed)
PROGRESS NOTE  Garrett Terry HAL:[id number] DOB: 1946/08/04 DOA: 03/27/2021 PCP: [name], NP  Brief History   75 year old man PMH including aortic valve replacement on warfarin, alcohol dependence currently in rehab pathology pleural, sent to the emergency department for reported hemoglobin of 6.6 as an outpatient, also reported coffee-ground emesis.  Admitted for acute blood loss anemia secondary to suspected upper GI bleed.  A & P  Acute blood loss anemia thought secondary to upper GI bleed although EGD was unrevealing, showing some distal esophagitis.  Reported coffee-ground emesis in the context of chronic alcohol use and anticoagulation with warfarin --Continue Protonix infusion per GI, can likely transition to oral tomorrow. --Coffee-ground emesis monitor actually Nocturnal regurgitation/esophageal reflux.  Severe microcytic anemia --secondary to GI loss. Ferritin low.  --IV iron --trend Hgb  Aortic valve replacement (confirmed with patient) --INR subtherapeutic on admission.  Warfarin on hold. --Start IV heparin monitor for bleeding.  Discussed with Dr. Cristina Gong who concurred.  Abnormal colon CT. Focally thickened segment of sigmoid colon in a region of several diverticula but without acute inflammation. Could reflect sequela of prior infection/inflammation though warrants further evaluation with direct visualization is underlying mass lesion is not fully excluded. --per GI.  Reportedly up-to-date with colonoscopy 5 years ago or so in [place].  Abnormal lungs on CT. Few clustered tree-in-bud opacities are present in the right middle and lower lobe, could reflect acute infection or  inflammation including sequela of aspiration in the appropriate clinical setting. --no s/s of infection or pneumonitis --monitor clinically, presume from regurgitation > meds, diet and elevate head of bed  Mild bilateral perinephric stranding and some mild bladder wall thickening,  recommend correlation with urinalysis to exclude ascending tract infection. --u/a negative  Alcohol abuse.  No signs, symptoms or stigmata of liver disease. --CIWA, no signs of withdrawal currently  T12, L1 compression deformities, old --follow-up as an outpatient.  Aortic atherosclerosis  --no inpt treatment  Disposition Plan:  Discussion:   Status is: Observation  The patient remains OBS appropriate and will d/c before 2 midnights.  Dispo: The patient is from: Home              Anticipated d/c is to: Home              Patient currently is not medically stable to d/c.   Difficult to place patient No  DVT prophylaxis: SCDs Start: 03/27/21 2045   Code Status: DNR Level of care: Telemetry Family Communication: none  Murray Hodgkins, MD  [facility] Direct contact: see [email or web address] (further directions at bottom of note if needed) 7PM-7AM contact night coverage as at bottom of note 03/28/2021, 3:35 PM  LOS: 0 days   Significant Hospital Events   .    Consults:  .    Procedures:  .   Significant Diagnostic Tests:  Marland Kitchen    Micro Data:  .    Antimicrobials:  .   Interval History/Subjective  CC: f/u bleeding  Feels fine, no complaints now, no pain  Objective   Vitals:  Vitals:   03/28/21 1050 03/28/21 1400  BP: (!) 108/46 109/64  Pulse: 78 70  Resp: 16 16  Temp:    SpO2: 95% 100%    Exam:  Constitutional:   . Appears calm and comfortable ENMT:  . grossly normal hearing  Respiratory:  . CTA bilaterally, no w/r/r.  . Respiratory effort normal. Cardiovascular:  . RRR, no m/r/g . No LE extremity edema   Abdomen:  . soft  Psychiatric:  . Mental status o Mood, affect appropriate  I have personally reviewed the following:   Today's Data  . Creatinine stable at 1.30 . Hgb 7.7 > 6.5 (was 8.6 03/09/2021)  Scheduled Meds: . sodium chloride   Intravenous Once  . folic acid  1 mg Oral Daily  . multivitamin with minerals  1 tablet Oral Daily   . thiamine  100 mg Oral Daily   Or  . thiamine  100 mg Intravenous Daily   Continuous Infusions: . pantoprozole (PROTONIX) infusion 8 mg/hr (03/28/21 0314)    Principal Problem:   GI bleed Active Problems:   Acute blood loss anemia   S/P AVR   Alcohol dependence (H[facility])   Microcytic anemia   Compression fracture of T12 vertebra (H[facility])   Closed compression fracture of body of L1 vertebra (H[facility])   Aortic atherosclerosis (H[facility])   LOS: 0 days   How to contact the [facility] Attending or Consulting provider [place] or covering provider during after hours [place], for this patient?  1. Check the care team in [facility] and look for a) attending/consulting [facility] provider listed and b) the [facility] team listed 2. Log into [email or web address] and use [place]'s universal password to access. If you do not have the password, please contact the hospital operator. 3. Locate the [facility] provider you are looking for under [facility] and page to a number that you can be directly reached. 4. If you still have difficulty reaching the provider, please page the [facility] (Director on Call) for the Hospitalists listed on amion for assistance.

## 2021-03-28 NOTE — Progress Notes (Signed)
Patient's hemoglobin has dropped significantly from 7.7 yesterday afternoon to 6.5 this morning, but that is associated with a slight decline in BUN, which remains normal, so I think this reflects equilibration rather than active bleeding.  Patient's endoscopy does not show any blood in the stomach, nor any prospective bleeding site, but it does show a large hiatal hernia with free reflux, some distal esophagitis, and some gastric nodules which are not likely clinically significant (see dictated procedure report).  Importantly, this patient shows no endoscopic evidence of portal hypertension.  Recommendations:  1.  I have started the patient on a diet  2.  For now, would continue Protonix infusion, but that could be converted to oral therapy tomorrow  3.  The patient does not appear to be at risk for clinically significant bleeding, so I think discharge tomorrow is a realistic possibility.  4.  Based on endoscopic findings, and on taking a detailed history, I am pretty sure that the patient's coffee-ground "emesis" is actually more nocturnal regurgitation/esophageal reflux.  Therefore, I think the mainstay of his treatment will be aggressive medical therapy and dietary measures, but if those are not effective over time, consideration could be given to antireflux surgery.  It should be noted there was some question of possible previous antireflux surgery based on surgical clips seen on his CT scan, but there was no obvious endoscopic evidence of a "wrap" having been performed.  Cleotis Nipper, M.D. Pager [phone] If no answer or after 5 PM call [phone]

## 2021-03-28 NOTE — ED Notes (Signed)
ED TO INPATIENT HANDOFF REPORT  ED Nurse Name and Phone #:  Erick Colace, RN [phone]  S Name/Age/Gender Garrett Terry 75 y.o. male Room/Bed: WA19/WA19  Code Status   Code Status: DNR  Home/SNF/Other Home Patient oriented to: self, place, time and situation Is this baseline? Yes   Triage Complete: Triage complete  Chief Complaint Weakness [R53.1] GI bleed [K92.2]  Triage Note 75 yo male BIBA from facility [facility] for ETOH on Monday. Per EMS facility did blood work that showed low hbg/ hct. Per Ems facility states pts hbg level was 6.6, pt has history of blood transfusions. Pt currently exhibiting fatigue and n/v. No other complaints at this time. Pt is AOx4.   Vitals: 116/72 Hr 100 rr 16 spo2 98% ra cbg 176 gcs 15    Allergies Allergies  Allergen Reactions  . Aspirin     Other reaction(s): Other (See Comments) Can't take due to taking blood thinners  . Morphine And Related Other (See Comments)    Other reaction(s): Other (See Comments) Hallucinations    . Promethazine Other (See Comments)    Other reaction(s): Other (See Comments) Hallucinations     Level of Care/Admitting Diagnosis ED Disposition    ED Disposition Condition Comment   Admit  Hospital Area: [facility] [[id number]]  Level of Care: Telemetry [5]  Admit to tele based on following criteria: Complex arrhythmia (Bradycardia/Tachycardia)  Covid Evaluation: Asymptomatic Screening Protocol (No Symptoms)  Diagnosis: GI bleed [[id number]]  Admitting Physician: Shela Leff [[id number]]  Attending Physician: Shela Leff [[id number]]       B Medical/Surgery History Past Medical History:  Diagnosis Date  . Anxiety   . Aortic atherosclerosis ([place]) 03/28/2021  . Closed compression fracture of body of L1 vertebra ([place]) 03/28/2021  . Complication of anesthesia    PONV  . Compression fracture of T12 vertebra ([place]) 03/28/2021  . Depression   . History of  bladder cancer   . History of pneumonia 1959  . History of prostate cancer   . History of scarlet fever 1956  . History of stroke 2009  . Hx of long term use of blood thinners   . Hypertension   . Long term current use of anticoagulant 03/22/2011  . 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 AORTIC VALVE REPLACEMENT     mechanical  . [place]   Bladder Cancer Surgery   . KNEE SURGERY Right 1980  . KNEE SURGERY Left 1982  . OTHER SURGICAL HISTORY  1992   stomach muscles removed due to Coumadin caused bleeding.  . TONSILLECTOMY  1965     A IV Location/Drains/Wounds Patient Lines/Drains/Airways Status    Active Line/Drains/Airways    Name Placement date Placement time Site Days   Peripheral IV 03/27/21 18 G Anterior;Distal;Right;Upper Arm 03/27/21  1421  Arm  1   Peripheral IV 03/27/21 20 G Anterior;Left Forearm 03/27/21  1538  Forearm  1          Intake/Output Last 24 hours  Intake/Output Summary (Last 24 hours) at 03/28/2021 1921 Last data filed at 03/28/2021 1858 Gross per 24 hour  Intake 2074.37 ml  Output --  Net 2074.37 ml    Labs/Imaging Results for orders placed or performed during the hospital encounter of 03/27/21 (from the past 48 hour(s))  Comprehensive metabolic panel     Status: Abnormal   Collection Time: 03/27/21  3:32 PM  Result Value Ref Range  Sodium 137 135 - 145 mmol/L   Potassium 4.1 3.5 - 5.1 mmol/L   Chloride 107 98 - 111 mmol/L   CO2 25 22 - 32 mmol/L   Glucose, Bld 104 (H) 70 - 99 mg/dL    Comment: Glucose reference range applies only to samples taken after fasting for at least 8 hours.   BUN 20 8 - 23 mg/dL   Creatinine, Ser 1.37 (H) 0.61 - 1.24 mg/dL   Calcium 8.9 8.9 - 10.3 mg/dL   Total Protein 6.7 6.5 - 8.1 g/dL   Albumin 3.3 (L) 3.5 - 5.0 g/dL   AST 17 15 - 41 U/L   ALT 10 0 - 44 U/L   Alkaline Phosphatase 78 38 - 126 U/L   Total Bilirubin 0.5 0.3 - 1.2 mg/dL   GFR, Estimated 54 (L)  >60 mL/min    Comment: (NOTE) Calculated using the CKD-EPI Creatinine Equation (2021)    Anion gap 5 5 - 15    Comment: Performed at [facility], [place] [address], [place], [place] [address]  CBC WITH DIFFERENTIAL     Status: Abnormal   Collection Time: 03/27/21  3:32 PM  Result Value Ref Range   WBC 8.9 4.0 - 10.5 K/uL   RBC 3.43 (L) 4.22 - 5.81 MIL/uL   Hemoglobin 7.7 (L) 13.0 - 17.0 g/dL    Comment: Reticulocyte Hemoglobin testing may be clinically indicated, consider ordering this additional test [id number]    HCT 26.5 (L) 39.0 - 52.0 %   MCV 77.3 (L) 80.0 - 100.0 fL   MCH 22.4 (L) 26.0 - 34.0 pg   MCHC 29.1 (L) 30.0 - 36.0 g/dL   RDW 16.8 (H) 11.5 - 15.5 %   Platelets 302 150 - 400 K/uL   nRBC 0.0 0.0 - 0.2 %   Neutrophils Relative % 75 %   Neutro Abs 6.7 1.7 - 7.7 K/uL   Lymphocytes Relative 10 %   Lymphs Abs 0.9 0.7 - 4.0 K/uL   Monocytes Relative 9 %   Monocytes Absolute 0.8 0.1 - 1.0 K/uL   Eosinophils Relative 5 %   Eosinophils Absolute 0.4 0.0 - 0.5 K/uL   Basophils Relative 1 %   Basophils Absolute 0.1 0.0 - 0.1 K/uL   Immature Granulocytes 0 %   Abs Immature Granulocytes 0.03 0.00 - 0.07 K/uL    Comment: Performed at [facility], [place] [address], [place], [place] [address]  Protime-INR     Status: Abnormal   Collection Time: 03/27/21  3:32 PM  Result Value Ref Range   Prothrombin Time 15.9 (H) 11.4 - 15.2 seconds   INR 1.3 (H) 0.8 - 1.2    Comment: (NOTE) INR goal varies based on device and disease states. Performed at [facility], [place] [address] [place], [place] [address]   Type and screen [place]     Status: None (Preliminary result)   Collection Time: 03/27/21  3:32 PM  Result Value Ref Range   ABO/RH(D) O POS    Antibody Screen NEG    Sample Expiration 03/30/2021,2359    Unit Number [id number]    Blood Component Type RED CELLS,LR    Unit division 00    Status of  Unit ISSUED    Transfusion Status OK TO TRANSFUSE    Crossmatch Result      Compatible Performed at [facility], [place] [address] [place], [place] [address]   POC occult blood, ED  Status: None   Collection Time: 03/27/21  4:33 PM  Result Value Ref Range   Fecal Occult Bld NEGATIVE NEGATIVE  Urinalysis, Routine w reflex microscopic     Status: Abnormal   Collection Time: 03/27/21  7:01 PM  Result Value Ref Range   Color, Urine COLORLESS (A) YELLOW   APPearance CLEAR CLEAR   Specific Gravity, Urine 1.005 1.005 - 1.030   pH 6.0 5.0 - 8.0   Glucose, UA NEGATIVE NEGATIVE mg/dL   Hgb urine dipstick NEGATIVE NEGATIVE   Bilirubin Urine NEGATIVE NEGATIVE   Ketones, ur NEGATIVE NEGATIVE mg/dL   Protein, ur NEGATIVE NEGATIVE mg/dL   Nitrite NEGATIVE NEGATIVE   Leukocytes,Ua NEGATIVE NEGATIVE    Comment: Performed at [place] [address], [place], [place] [address]  Resp Panel by RT-PCR (Flu A&B, Covid) Nasopharyngeal Swab     Status: None   Collection Time: 03/27/21  7:02 PM   Specimen: Nasopharyngeal Swab; Nasopharyngeal(NP) swabs in vial transport medium  Result Value Ref Range   SARS Coronavirus 2 by RT PCR NEGATIVE NEGATIVE    Comment: (NOTE) SARS-CoV-2 target nucleic acids are NOT DETECTED.  The SARS-CoV-2 RNA is generally detectable in upper respiratory specimens during the acute phase of infection. The lowest concentration of SARS-CoV-2 viral copies this assay can detect is 138 copies/mL. A negative result does not preclude SARS-Cov-2 infection and should not be used as the sole basis for treatment or other patient management decisions. A negative result may occur with  improper specimen collection/handling, submission of specimen other than nasopharyngeal swab, presence of viral mutation(s) within the areas targeted by this assay, and inadequate number of viral copies(<138 copies/mL). A negative result must be combined  with clinical observations, patient history, and epidemiological information. The expected result is Negative.  Fact Sheet for Patients:  EntrepreneurPulse.com.au  Fact Sheet for Healthcare Providers:  IncredibleEmployment.be  This test is no t yet approved or cleared by the [place] FDA and  has been authorized for detection and/or diagnosis of SARS-CoV-2 by FDA under an Emergency Use Authorization (EUA). This EUA will remain  in effect (meaning this test can be used) for the duration of the COVID-19 declaration under Section 564(b)(1) of the Act, 21 U.S.C.section 360bbb-3(b)(1), unless the authorization is terminated  or revoked sooner.       Influenza A by PCR NEGATIVE NEGATIVE   Influenza B by PCR NEGATIVE NEGATIVE    Comment: (NOTE) The Xpert Xpress SARS-CoV-2/FLU/RSV plus assay is intended as an aid in the diagnosis of influenza from Nasopharyngeal swab specimens and should not be used as a sole basis for treatment. Nasal washings and aspirates are unacceptable for Xpert Xpress SARS-CoV-2/FLU/RSV testing.  Fact Sheet for Patients: EntrepreneurPulse.com.au  Fact Sheet for Healthcare Providers: IncredibleEmployment.be  This test is not yet approved or cleared by the [place] FDA and has been authorized for detection and/or diagnosis of SARS-CoV-2 by FDA under an Emergency Use Authorization (EUA). This EUA will remain in effect (meaning this test can be used) for the duration of the COVID-19 declaration under Section 564(b)(1) of the Act, 21 U.S.C. section 360bbb-3(b)(1), unless the authorization is terminated or revoked.  Performed at [facility], [place] [address], [place], [place] [address]   ABO/Rh     Status: None   Collection Time: 03/28/21  9:05 AM  Result Value Ref Range   ABO/RH(D)      O POS Performed at [facility], [place] [address]  [place], [place] [address]   Magnesium     Status: None   Collection Time: 03/28/21  9:05 AM  Result Value Ref Range   Magnesium 1.7 1.7 - 2.4 mg/dL    Comment: Performed at [facility], [place] [address], [place], [place] [address]  Phosphorus     Status: None   Collection Time: 03/28/21  9:05 AM  Result Value Ref Range   Phosphorus 3.2 2.5 - 4.6 mg/dL    Comment: Performed at [facility], [place] [address] [place], [place] [address]  CBC     Status: Abnormal   Collection Time: 03/28/21  9:05 AM  Result Value Ref Range   WBC 5.1 4.0 - 10.5 K/uL   RBC 2.93 (L) 4.22 - 5.81 MIL/uL   Hemoglobin 6.5 (LL) 13.0 - 17.0 g/dL    Comment: REPEATED TO VERIFY Reticulocyte Hemoglobin testing may be clinically indicated, consider ordering this additional test [id number] THIS CRITICAL RESULT HAS VERIFIED AND BEEN CALLED TO BLAIR, I RN BY MEGAN HAYES ON 05 28 2022 AT 0929, AND HAS BEEN READ BACK. CRITICAL RESULT VERIFIED    HCT 22.7 (L) 39.0 - 52.0 %   MCV 77.5 (L) 80.0 - 100.0 fL   MCH 22.2 (L) 26.0 - 34.0 pg   MCHC 28.6 (L) 30.0 - 36.0 g/dL   RDW 16.4 (H) 11.5 - 15.5 %   Platelets 249 150 - 400 K/uL   nRBC 0.0 0.0 - 0.2 %    Comment: Performed at [facility], [place] [address] [place], [place] [address]  Basic metabolic panel     Status: Abnormal   Collection Time: 03/28/21  9:05 AM  Result Value Ref Range   Sodium 138 135 - 145 mmol/L   Potassium 4.2 3.5 - 5.1 mmol/L   Chloride 109 98 - 111 mmol/L   CO2 26 22 - 32 mmol/L   Glucose, Bld 94 70 - 99 mg/dL    Comment: Glucose reference range applies only to samples taken after fasting for at least 8 hours.   BUN 16 8 - 23 mg/dL   Creatinine, Ser 1.30 (H) 0.61 - 1.24 mg/dL   Calcium 8.1 (L) 8.9 - 10.3 mg/dL   GFR, Estimated 58 (L) >60 mL/min    Comment: (NOTE) Calculated using the CKD-EPI Creatinine Equation (2021)    Anion gap 3 (L) 5 - 15    Comment: Performed at [facility], [place] [address] [place], [place] [address]  Prepare RBC (crossmatch)     Status: None   Collection Time: 03/28/21  1:24 PM  Result Value Ref Range   Order Confirmation      ORDER PROCESSED BY BLOOD BANK Performed at [place] [address] [place], [place] [address]    CT ABDOMEN PELVIS WO CONTRAST  Result Date: 03/27/2021 CLINICAL DATA:  Low hemoglobin, hematocrit, history of blood transfusions, fatigue and nausea vomiting EXAM: CT ABDOMEN AND PELVIS WITHOUT CONTRAST TECHNIQUE: Multidetector CT imaging of the abdomen and pelvis was performed following the standard protocol without IV contrast. COMPARISON:  None. FINDINGS: Lower chest: Few clustered tree-in-bud opacities are seen in the right lower lobe (4/32) and right middle lobe (4/1). Calcified granuloma in the left lung base (4/19). Top-normal cardiac size. Calcifications of the mitral annulus and chordae tendinae. Hypoattenuation of the cardiac blood pool may reflect a relative anemia compatible patient's low hemoglobin/hematocrit. Atherosclerotic calcifications of the coronary arteries. Hepatobiliary: Punctate calcifications noted within the hepatic  parenchyma. Smooth surface contour. No concerning focal liver lesion. Likely physiologic distension of the gallbladder. No pericholecystic fluid or inflammation. No visible calcified gallstones or biliary ductal dilatation. Pancreas: No pancreatic ductal dilatation or surrounding inflammatory changes. Spleen: Punctate calcifications throughout the spleen. Splenic size is within normal limits. No concerning focal splenic lesion. Adrenals/Urinary Tract: Normal adrenals. Kidneys are symmetric in size and normally located. Tiny subcentimeter 5 mm rounded subcapsular focus on the anterior interpolar left kidney (2/33) too small to fully characterize though statistically likely benign. No obstructive urolithiasis or hydronephrosis. Mild bilateral perinephric stranding is  nonspecific particularly given some mild bladder wall thickening. Keyhole defect at the level of the bladder base may reflect prior TURP or other postsurgical change. Stomach/Bowel: Moderate hiatal hernia despite postsurgical changes at the diaphragmatic hiatus likely reflecting prior hernia surgery. Surgical clips noted towards the gastric antrum and upper midline abdomen as well. Stomach and duodenum are otherwise unremarkable. No small bowel thickening or dilatation accounting for underdistention. Appendix is not visualized. No focal inflammation the vicinity of the cecum to suggest an occult appendicitis. Diffuse pancolonic and terminal ileal diverticulosis. Focally thickened segment of sigmoid colon is noted without significant colonic stranding (5/74). In a region of numerous diverticula. No evidence of bowel obstruction. Vascular/Lymphatic: Atherosclerotic calcifications within the abdominal aorta and branch vessels. No aneurysm or ectasia. No enlarged abdominopelvic lymph nodes. Reproductive: Keyhole defect at the bladder base/prostate, correlate with postsurgical change. Prostate seminal vesicles are otherwise. Other: Fat containing bilateral inguinal hernias, left greater than right. No frank bowel containing hernia though the left inguinal hernia is closely apposed by proximal sigmoid. No free intraperitoneal air or fluid. Postsurgical changes from prior vertical midline incision. Musculoskeletal: Remote appearing compression deformities T12 (50% height loss) and L1 (20% height loss). Multilevel degenerative changes are present in the imaged portions of the spine. Additional degenerative changes in the hips and pelvis. IMPRESSION: 1. Focally thickened segment of sigmoid colon in a region of several diverticula but without acute inflammation. Could reflect sequela of prior infection/inflammation though warrants further evaluation with direct visualization is underlying mass lesion is not fully excluded. 2.  Additional diverticulosis throughout the terminal ileum and colon. 3. Few clustered tree-in-bud opacities are present in the right middle and lower lobe, could reflect acute infection or inflammation including sequela of aspiration in the appropriate clinical setting. 4. Mild bilateral perinephric stranding and some mild bladder wall thickening, recommend correlation with urinalysis to exclude ascending tract infection. 5. Remote appearing compression deformities of T12, L1, though could correlate for local tenderness to exclude acuity. 6. Hypoattenuating cardiac blood pool compatible with anemia. 7. Evidence of prior granulomatous disease in the chest and abdomen. 8. Keyhole defect at the level of the bladder base/prostate, correlate with prior surgical history. 9. Moderate hiatal hernia despite surgical changes near the diaphragmatic hiatus. 10.  Aortic Atherosclerosis (ICD10-I70.0). Electronically Signed   By: Lovena Le M.D.   On: 03/27/2021 18:26   US Abdomen Complete  Result Date: 03/27/2021 CLINICAL DATA:  Anemia, gastrointestinal bleeding EXAM: ABDOMEN ULTRASOUND COMPLETE COMPARISON:  03/27/2021 FINDINGS: Gallbladder: No gallstones or wall thickening visualized. No sonographic Murphy sign noted by sonographer. Common bile duct: Diameter: 5 mm Liver: No focal lesion identified. Within normal limits in parenchymal echogenicity. Portal vein is patent on color Doppler imaging with normal direction of blood flow towards the liver. IVC: Not well visualized. Pancreas: Visualized portions of the head and body are unremarkable. Tail is obscured by bowel gas. Spleen: Grossly normal in size. 2.4  x 1.9 x 2.1 cm hyperechoic focus within the inferior aspect of the splenic hilum could reflect a small splenic hemangioma. No other focal abnormalities. Right Kidney: Length: 10.9 cm. Echogenicity within normal limits. 17 mm peripelvic cyst. No hydronephrosis or nephrolithiasis. Left Kidney: Length: 10.3 cm. Normal  echogenicity. 9 mm cortical cyst midpole. No hydronephrosis or nephrolithiasis. Abdominal aorta: No aneurysm visualized. Other findings: None. IMPRESSION: 1. Small bilateral renal cysts. 2. Indeterminate 2.4 cm hyperechoic focus inferior splenic hilum, favor hemangioma. 3. Limited visualization of the IVC and pancreas due to bowel gas. Electronically Signed   By: Randa Ngo M.D.   On: 03/27/2021 21:14    Pending Labs Unresulted Labs (From admission, onward)          Start     Ordered   03/29/21 0500  CBC  Daily,   R      03/28/21 1551   03/29/21 0500  Protime-INR  Daily,   R      03/28/21 1551   03/29/21 0100  Heparin level (unfractionated)  Once-Timed,   STAT        03/28/21 1551   03/29/21 0000  CBC  Once,   STAT        03/28/21 1535   Signed and Held  CBC  Tomorrow morning,   R        Signed and Held          Vitals/Pain Today's Vitals   03/28/21 1700 03/28/21 1854 03/28/21 1900 03/28/21 1915  BP: 138/74 112/68 122/68 124/64  Pulse: 80 77 77 80  Resp:  16  17  Temp:  98.9 F (37.2 C)    TempSrc:  Oral    SpO2: 100% 100% 100% 100%  Weight:      Height:      PainSc:        Isolation Precautions No active isolations  Medications Medications  pantoprazole (PROTONIX) 80 mg in sodium chloride 0.9 % 100 mL (0.8 mg/mL) infusion (8 mg/hr Intravenous New Bag/Given 03/28/21 1557)  ondansetron (ZOFRAN) injection 4 mg ( Intravenous MAR Unhold 03/28/21 1155)  acetaminophen (TYLENOL) tablet 650 mg ( Oral MAR Unhold 03/28/21 1155)    Or  acetaminophen (TYLENOL) suppository 650 mg ( Rectal MAR Unhold 03/28/21 1155)  0.9 %  sodium chloride infusion (0 mLs Intravenous Stopped 03/28/21 1220)  LORazepam (ATIVAN) tablet 1-4 mg ( Oral MAR Unhold 03/28/21 1155)    Or  LORazepam (ATIVAN) injection 1-4 mg ( Intravenous MAR Unhold 03/28/21 1155)  thiamine tablet 100 mg (100 mg Oral Given 03/28/21 1301)    Or  thiamine (B-1) injection 100 mg ( Intravenous See Alternative 6/78/93 8101)   folic acid (FOLVITE) tablet 1 mg (1 mg Oral Given 03/28/21 1301)  multivitamin with minerals tablet 1 tablet (1 tablet Oral Given 03/28/21 1302)  heparin ADULT infusion 100 units/mL (25000 units/21mL) (1,100 Units/hr Intravenous New Bag/Given 03/28/21 1905)  pantoprazole (PROTONIX) 80 mg in sodium chloride 0.9 % 100 mL IVPB (0 mg Intravenous Stopped 03/27/21 1837)  ondansetron (ZOFRAN) injection 4 mg (4 mg Intravenous Given 03/27/21 1539)  lactated ringers infusion (10 mL/hr Intravenous New Bag/Given 03/28/21 0924)  0.9 %  sodium chloride infusion (Manually program via Guardrails IV Fluids) ( Intravenous Stopped 03/28/21 1858)    Mobility walks Low fall risk   Focused Assessments    R Recommendations: See Admitting Provider Note  Report given to:   Additional Notes:

## 2021-03-28 NOTE — Transfer of Care (Signed)
Immediate Anesthesia Transfer of Care Note  Patient: Garrett Terry  Procedure(s) Performed: ESOPHAGOGASTRODUODENOSCOPY (EGD) WITH PROPOFOL (N/A ) BIOPSY  Patient Location: Endoscopy Unit  Anesthesia Type:MAC  Level of Consciousness: drowsy and patient cooperative  Airway & Oxygen Therapy: Patient Spontanous Breathing and Patient connected to face mask oxygen  Post-op Assessment: Report given to RN and Post -op Vital signs reviewed and stable  Post vital signs: Reviewed and stable  Last Vitals:  Vitals Value Taken Time  BP 103/64 1020  Temp    Pulse 96   Resp    SpO2 100%     Last Pain:  Vitals:   03/28/21 0917  TempSrc: Oral  PainSc: 0-No pain         Complications: No complications documented.

## 2021-03-28 NOTE — Discharge Instructions (Signed)

## 2021-03-28 NOTE — ED Notes (Signed)
PT bed and gown change.

## 2021-03-28 NOTE — Op Note (Signed)
[facility] Patient Name: Garrett Terry Procedure Date: 03/28/2021 MRN: [id number] Attending MD: Ronald Lobo , MD Date of Birth: 01-05-1946 CSN: [id number] Age: 75 Admit Type: Inpatient Procedure:                Upper GI endoscopy Indications:              Iron deficiency anemia, Abnormal CT of the GI tract Providers:                Ronald Lobo, MD, Nelia Shi, RN, Tyrone Apple, Technician, Hedy Camara Referring MD:              Medicines:                Monitored Anesthesia Care Complications:            No immediate complications. Estimated Blood Loss:     Estimated blood loss was minimal. Estimated blood                            loss was minimal. Procedure:                Pre-Anesthesia Assessment:                           - Prior to the procedure, a History and Physical                            was performed, and patient medications and                            allergies were reviewed. The patient's tolerance of                            previous anesthesia was also reviewed. The risks                            and benefits of the procedure and the sedation                            options and risks were discussed with the patient.                            All questions were answered, and informed consent                            was obtained. Prior Anticoagulants: The patient has                            taken Coumadin (warfarin), last dose was 3 days                            prior to procedure. ASA Grade Assessment: III - A  patient with severe systemic disease. After                            reviewing the risks and benefits, the patient was                            deemed in satisfactory condition to undergo the                            procedure.                           After obtaining informed consent, the endoscope was                            passed under  direct vision. Throughout the                            procedure, the patient's blood pressure, pulse, and                            oxygen saturations were monitored continuously. The                            [id number]-H190 ([id number] Olympus gastroscope was                            introduced through the mouth, and advanced to the                            second part of duodenum. The upper GI endoscopy was                            accomplished without difficulty. The patient                            tolerated the procedure well. Scope In: Scope Out: Findings:      LA Grade B (one or more mucosal breaks greater than 5 mm, not extending       between the tops of two mucosal folds) esophagitis, characterized by       focal erosion and longitudinal erythema extending several centimeters,       with no bleeding was found.      Gastroesophageal reflux is evident by free flow of gastric contents in       the lower third of the esophagus.      There is no endoscopic evidence of stricture, varices or mass in the       entire esophagus.      A 5 cm hiatal hernia was present. The diaphragmatic hiatus was quite       patulous.      A few 6 mm papules (nodules) were found in the cardia and in the gastric       body. Biopsies were taken with a cold forceps for histology.      There is no endoscopic evidence of ulceration, varices, portal  hypertension gastropathy or mass in the entire examined stomach.      There was no clear evidence of prior gastric surgery, either resection       or fundoplication (by history and CT, patient has apparently had prior       gastric surgery but details are not available).      The cardia and gastric fundus were normal on retroflexion.      The examined duodenum was normal. Impression:               - No active bleeding, blood in the stomach, or                            likely bleeding site seen at time of this                             examination.                           - LA Grade B reflux esophagitis with no bleeding.                           - GERD, as evident by free flow of gastric contents                            into the esophagus.                           - 5 cm hiatal hernia.                           - A few papules (nodules) found in the stomach.                            Biopsied.                           - Normal examined duodenum. Moderate Sedation:      This patient was sedated with monitored anesthesia care, not moderate       sedation. Recommendation:           - Await pathology results.                           - Continue present medications. Procedure Code(s):        --- Professional ---                           [phone], Esophagogastroduodenoscopy, flexible,                            transoral; with biopsy, single or multiple Diagnosis Code(s):        --- Professional ---                           K21.00, Gastro-esophageal reflux disease with  esophagitis, without bleeding                           K44.9, Diaphragmatic hernia without obstruction or                            gangrene                           K31.89, Other diseases of stomach and duodenum                           D50.9, Iron deficiency anemia, unspecified                           R93.3, Abnormal findings on diagnostic imaging of                            other parts of digestive tract CPT copyright 2019 [employer]. All rights reserved. The codes documented in this report are preliminary and upon coder review may  be revised to meet current compliance requirements. Ronald Lobo, MD 03/28/2021 10:41:30 AM This report has been signed electronically. Number of Addenda: 0

## 2021-03-28 NOTE — Hospital Course (Addendum)
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  Admitted for acute blood loss anemia secondary to suspected upper GI bleed.  A & P  Acute blood loss anemia thought secondary to upper GI bleed although EGD was unrevealing, showing some distal esophagitis.  Reported coffee-ground emesis in the context of chronic alcohol use and anticoagulation with warfarin --Continue Protonix infusion per GI, can likely transition to oral tomorrow. --Coffee-ground emesis monitor actually Nocturnal regurgitation/esophageal reflux.  Severe microcytic anemia --secondary to GI loss. Ferritin low.  --IV iron --trend Hgb  Aortic valve replacement (confirmed with patient) --INR subtherapeutic on admission.  Warfarin on hold. --Start IV heparin monitor for bleeding.  Discussed with Dr. Cristina Gong who concurred.  Abnormal colon CT. Focally thickened segment of sigmoid colon in a region of several diverticula but without acute inflammation. Could reflect sequela of prior infection/inflammation though warrants further evaluation with direct visualization is underlying mass lesion is not fully excluded. --per GI.  Reportedly up-to-date with colonoscopy 5 years ago or so in [place].  Abnormal lungs on CT. Few clustered tree-in-bud opacities are present in the right middle and lower lobe, could reflect acute infection or  inflammation including sequela of aspiration in the appropriate clinical setting. --no s/s of infection or pneumonitis --monitor clinically, presume from regurgitation > meds, diet and elevate head of bed  Mild bilateral perinephric stranding and some mild bladder wall thickening, recommend correlation with urinalysis to exclude ascending tract infection. --u/a negative  Alcohol abuse.  No signs, symptoms or  stigmata of liver disease. --CIWA, no signs of withdrawal currently  T12, L1 compression deformities, old --follow-up as an outpatient.  Aortic atherosclerosis  --no inpt treatment

## 2021-03-29 ENCOUNTER — Other Ambulatory Visit (HOSPITAL_COMMUNITY): Payer: Commercial Managed Care - PPO

## 2021-03-29 DIAGNOSIS — S22080A Wedge compression fracture of T11-T12 vertebra, initial encounter for closed fracture: Secondary | ICD-10-CM | POA: Diagnosis not present

## 2021-03-29 DIAGNOSIS — Z952 Presence of prosthetic heart valve: Secondary | ICD-10-CM | POA: Diagnosis not present

## 2021-03-29 DIAGNOSIS — S32010A Wedge compression fracture of first lumbar vertebra, initial encounter for closed fracture: Secondary | ICD-10-CM | POA: Diagnosis not present

## 2021-03-29 DIAGNOSIS — D509 Iron deficiency anemia, unspecified: Secondary | ICD-10-CM | POA: Diagnosis not present

## 2021-03-29 DIAGNOSIS — K317 Polyp of stomach and duodenum: Secondary | ICD-10-CM | POA: Diagnosis not present

## 2021-03-29 LAB — CBC
HCT: 26.9 % — ABNORMAL LOW (ref 39.0–52.0)
Hemoglobin: 8 g/dL — ABNORMAL LOW (ref 13.0–17.0)
MCH: 23.3 pg — ABNORMAL LOW (ref 26.0–34.0)
MCHC: 29.7 g/dL — ABNORMAL LOW (ref 30.0–36.0)
MCV: 78.4 fL — ABNORMAL LOW (ref 80.0–100.0)
Platelets: 239 10*3/uL (ref 150–400)
RBC: 3.43 MIL/uL — ABNORMAL LOW (ref 4.22–5.81)
RDW: 16.5 % — ABNORMAL HIGH (ref 11.5–15.5)
WBC: 7.5 10*3/uL (ref 4.0–10.5)
nRBC: 0 % (ref 0.0–0.2)

## 2021-03-29 LAB — PROTIME-INR
INR: 1.2 (ref 0.8–1.2)
Prothrombin Time: 14.8 seconds (ref 11.4–15.2)

## 2021-03-29 LAB — HEPARIN LEVEL (UNFRACTIONATED)
Heparin Unfractionated: 0.1 IU/mL — ABNORMAL LOW (ref 0.30–0.70)
Heparin Unfractionated: 0.49 IU/mL (ref 0.30–0.70)

## 2021-03-29 MED ORDER — PANTOPRAZOLE SODIUM 40 MG PO TBEC: 40.0000 mg | DELAYED_RELEASE_TABLET | Freq: Two times a day (BID) | ORAL | Status: DC

## 2021-03-29 MED ORDER — POLYVINYL ALCOHOL 1.4 % OP SOLN
1.0000 [drp] | OPHTHALMIC | Status: DC | PRN
  Filled 2021-03-29: qty 15

## 2021-03-29 MED ORDER — FOLIC ACID 1 MG PO TABS
1.0000 mg | ORAL_TABLET | Freq: Every day | ORAL | Status: DC
Start: 2021-03-30 — End: 2021-05-19

## 2021-03-29 NOTE — Progress Notes (Addendum)
Patient received 1 unit of packed red cells yesterday (his first during this admission), with an appropriate posttransfusion rise in hemoglobin from 6.5 yesterday morning to 8.0 this morning.  Biopsies from yesterday's endoscopy are pending.  Impression:  1.  Alcohol abuse without evidence of chronic liver disease and in particular without evidence of portal hypertension  2.  Anemia, etiology unclear (stool was heme-negative at time of admission), probably multifactorial, but overt GI bleeding not likely to be the case.  3.  Probable significant GERD by history and based on the presence of a relatively large hiatal hernia and some degree of erosive esophagitis.  Recommendations:  1.  I have stopped the patient's Protonix infusion and have converted him to twice daily oral Protonix.  I would probably have the patient remain on twice daily Protonix for the foreseeable future.  We might consider dropping back to once daily dosing, depending how he is doing, when we see him back in the office a month or so from now.  2.  Patient given antireflux instructions (avoidance of late night meals, avoidance of overeating, avoidance of triggering factors such as alcohol or high fat foods, avoidance of recumbency following meals, etc.)  3.  I will contact the patient with his biopsy results when they are available and institute what ever further therapy might be indicated based on those results.  4.  I will sign off at this time.  Please feel free to call me if I can be of further assistance in this patient's care.  5.  Patient would like to go home today and I think that discharge today is reasonable.  I do not see a clear indication why he needs to stay in the hospital.  6.  I have given the patient my card and asked him to call our office (if we do not call him first) to arrange a follow-up visit sometime in the next month or 2, to check Hemoccult status, hemoglobin, and determine whether colonoscopy or  some other form of updated colon cancer screening is needed.  We can also decide at that time whether to do repeat endoscopy, to confirm healing of his esophagitis.  (Since his esophagitis is relatively minor, I do not think we would have to do a repeat exam unless he has lingering symptoms despite medical therapy.)  Garrett Terry, M.D. Pager [phone] If no answer or after 5 PM call [phone]

## 2021-03-29 NOTE — Progress Notes (Signed)
[place] for IV heparin Indication: mech AVR  Allergies  Allergen Reactions  . Aspirin     Other reaction(s): Other (See Comments) Can't take due to taking blood thinners  . Morphine And Related Other (See Comments)    Other reaction(s): Other (See Comments) Hallucinations    . Promethazine Other (See Comments)    Other reaction(s): Other (See Comments) Hallucinations     Patient Measurements: Height: 5' 7.5" (171.5 cm) Weight: 78 kg (172 lb) IBW/kg (Calculated) : 67.25 Heparin Dosing Weight: TBW  Vital Signs: Temp: 98.1 F (36.7 C) (05/29 1211) Temp Source: Oral (05/29 0359) BP: 131/72 (05/29 1211) Pulse Rate: 79 (05/29 1211)  Labs: Recent Labs    03/27/21 1532 03/28/21 0905 03/29/21 0041 03/29/21 0944  HGB 7.7* 6.5* 8.0*  --   HCT 26.5* 22.7* 26.9*  --   PLT 302 249 239  --   LABPROT 15.9*  --  14.8  --   INR 1.3*  --  1.2  --   HEPARINUNFRC  --   --  0.10* 0.49  CREATININE 1.37* 1.30*  --   --     Estimated Creatinine Clearance: 47.5 mL/min (A) (by C-G formula based on SCr of 1.3 mg/dL (H)).   Medications:  Medications Prior to Admission  Medication Sig Dispense Refill Last Dose  . acetaminophen (TYLENOL) 500 MG tablet Take 500 mg by mouth daily as needed for moderate pain.   Past Week at Unknown time  . albuterol (VENTOLIN HFA) 108 (90 Base) MCG/ACT inhaler Inhale 1 puff into the lungs every 6 (six) hours as needed for wheezing or shortness of breath.   Past Week at Unknown time  . amLODipine (NORVASC) 5 MG tablet Take 1 tablet (5 mg total) by mouth daily. 90 tablet 1 03/27/2021 at Unknown time  . busPIRone (BUSPAR) 5 MG tablet Take 1 tablet (5 mg total) by mouth 2 (two) times daily. 180 tablet 1 03/27/2021 at Unknown time  . cetirizine (ZYRTEC) 10 MG tablet Take 10 mg by mouth daily.   03/27/2021 at Unknown time  . furosemide (LASIX) 20 MG tablet Take 1 tablet (20 mg total) by mouth daily. 90 tablet 1 03/27/2021 at  Unknown time  . lovastatin (MEVACOR) 40 MG tablet Take 40 mg by mouth at bedtime.   03/26/2021 at Unknown time  . Multiple Vitamin (MULTIVITAMIN WITH MINERALS) TABS tablet Take 1 tablet by mouth daily.   03/27/2021 at Unknown time  . pantoprazole (PROTONIX) 40 MG tablet Take 1 tablet (40 mg total) by mouth 2 (two) times daily. 180 tablet 1 03/27/2021 at Unknown time  . ramipril (ALTACE) 10 MG capsule Take 1 capsule (10 mg total) by mouth 2 (two) times daily. (Patient taking differently: Take 20 mg by mouth daily.) 180 capsule 1 03/27/2021 at Unknown time  . sertraline (ZOLOFT) 100 MG tablet Take 1 tablet (100 mg total) by mouth 2 (two) times daily. 180 tablet 1 03/27/2021 at Unknown time  . thiamine 100 MG tablet Take 100 mg by mouth daily.   03/27/2021 at Unknown time  . traZODone (DESYREL) 150 MG tablet Take 150 mg by mouth at bedtime.   03/26/2021 at Unknown time  . warfarin (COUMADIN) 1 MG tablet Take 0.5 tablets (0.5 mg total) by mouth daily. 45 tablet 1 03/26/2021 at 9 pm  . warfarin (COUMADIN) 2 MG tablet Take 1 tablet (2 mg total) by mouth daily. Along with 1/2 of 1 mg tablet for a total of 2.5  mg daily 90 tablet 1 03/26/2021 at 9 pm  . Iron, Ferrous Sulfate, 325 (65 Fe) MG TABS Take 325 mg by mouth in the morning and at bedtime. (Patient not taking: No sig reported) 180 tablet 3 Not Taking at Unknown time  . LORazepam (ATIVAN) 0.5 MG tablet Take 1 tablet (0.5 mg total) by mouth as directed. One tablet in morning, and two tablets at night. (Patient taking differently: Take 0.5 mg by mouth as directed. One tablet by mouth 3 times a day (tapering)) 90 tablet 0 03/26/21  . lovastatin (MEVACOR) 20 MG tablet Take 1 tablet (20 mg total) by mouth at bedtime. (Patient not taking: No sig reported) 90 tablet 1 Not Taking at Unknown time   Scheduled:  . folic acid  1 mg Oral Daily  . multivitamin with minerals  1 tablet Oral Daily  . pantoprazole  40 mg Oral BID AC  . thiamine  100 mg Oral Daily   Or  .  thiamine  100 mg Intravenous Daily   Assessment: 33 yoM with PMH mech AVR 1985 on chronic warfarin, EtOH abuse, admitted 5/27 for anemia and coffee-ground emesis. Warfarin held on admission; Pharmacy to dose IV heparin for AVR while INR subtherapeutic.    Baseline INR low; surprising given no missed doses and admit w/ UGIB  Prior anticoagulation: warfarin 2.5 mg daily; LD 5/26  INR goal 2.5-3.5 per patient and most recent PCP notes  Significant events:  Today, 03/29/2021:  Heparin level therapeutic on 1350 units/hr  CBC: Hgb improved after PRBC; Plt stable WNL  Minimal SCr history; but appears to be at baseline (~1.3)  No bleeding or infusion issues per nursing  GI does not feel actual GIB  Cardiology DID NOT feel bridging necessary for low INR d/t lower risk with aortic mech valve (may do ASA after discharge)  Goal of Therapy: Heparin level 0.3-0.7 units/ml Monitor platelets by anticoagulation protocol: Yes  Plan:  Continue heparin at 1350 units/hr until discharge  Daily CBC, daily heparin level once stable  Monitor for signs of bleeding or thrombosis  Reuel Boom, PharmD, BCPS [phone] 03/29/2021, 12:53 PM

## 2021-03-29 NOTE — TOC Initial Note (Signed)
Transition of Care [facility]) - Initial/Assessment Note    Patient Details  Name: Garrett Terry MRN: [id number] Date of Birth: 08/30/1946  [facility] CM/SW Contact:    Gregorio Worley, Marjie Skiff, RN Phone Number: 03/29/2021, 1:40 PM  Clinical Narrative:                 Was alerted by nursing staff that pt is from [facility] and would like to return there. He is ready for dc today. Contacted [facility] [employer] and faxed him all requested clinicals ([phone]). Pt has been accepted to return.  [facility] to come pick pt up at 3pm. Staff RN made aware.     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) Does the patient have difficulty seeing, even when wearing glasses/contacts?: No Does the patient have difficulty concentrating, remembering, or making decisions?: No Patient able to express need for assistance with ADLs?: Yes Does the patient have difficulty dressing or bathing?: No Independently performs ADLs?: Yes (appropriate for developmental age) Does the patient have difficulty walking or climbing stairs?: Yes (occasional) Weakness of Legs: Both Weakness of Arms/Hands: Both  Permission Sought/Granted                  Emotional Assessment              Admission diagnosis:  Weakness [R53.1] GI bleed [K92.2] Patient Active Problem List   Diagnosis Date Noted  . Microcytic anemia 03/28/2021  . Compression fracture of T12 vertebra ([place]) 03/28/2021  . Closed compression fracture of body of L1 vertebra ([place]) 03/28/2021  . Aortic atherosclerosis ([place]) 03/28/2021  . GI bleed 03/27/2021  . Acute blood loss anemia 03/27/2021  . S/P  AVR 03/27/2021  . Alcohol dependence ([place]) 03/27/2021  . Anxiety 10/27/2020  . Asthma 05/09/2020  . Erectile dysfunction 02/07/2020  . Dysphagia 09/19/2016  . History of cerebrovascular accident 07/23/2015  . Allergic rhinitis 06/30/2015  . Long term current use of anticoagulant 03/22/2011  . Gastroesophageal reflux disease 07/07/2010  . Hyperlipidemia 07/07/2010  . Heart valve disorder 07/07/2010   PCP:  [name], NP Pharmacy:   [facility], [place] - [address] [place] [address] Phone: [phone] Fax: [phone]     Social Determinants of Health (SDOH) Interventions    Readmission Risk Interventions No flowsheet data found.

## 2021-03-29 NOTE — Plan of Care (Signed)

## 2021-03-29 NOTE — Progress Notes (Signed)
[place] for IV heparin Indication: mech AVR  Allergies  Allergen Reactions  . Aspirin     Other reaction(s): Other (See Comments) Can't take due to taking blood thinners  . Morphine And Related Other (See Comments)    Other reaction(s): Other (See Comments) Hallucinations    . Promethazine Other (See Comments)    Other reaction(s): Other (See Comments) Hallucinations     Patient Measurements: Height: 5' 7.5" (171.5 cm) Weight: 78 kg (172 lb) IBW/kg (Calculated) : 67.25 Heparin Dosing Weight: TBW  Vital Signs: Temp: 98.9 F (37.2 C) (05/28 1854) Temp Source: Oral (05/28 1854) BP: 129/65 (05/28 2000) Pulse Rate: 83 (05/28 2000)  Labs: Recent Labs    03/27/21 1532 03/28/21 0905 03/29/21 0041  HGB 7.7* 6.5* 8.0*  HCT 26.5* 22.7* 26.9*  PLT 302 249 239  LABPROT 15.9*  --  14.8  INR 1.3*  --  1.2  HEPARINUNFRC  --   --  0.10*  CREATININE 1.37* 1.30*  --     Estimated Creatinine Clearance: 47.5 mL/min (A) (by C-G formula based on SCr of 1.3 mg/dL (H)).   Medical History: Past Medical History:  Diagnosis Date  . Anxiety   . Aortic atherosclerosis ([place]) 03/28/2021  . Closed compression fracture of body of L1 vertebra ([place]) 03/28/2021  . Complication of anesthesia    PONV  . Compression fracture of T12 vertebra ([place]) 03/28/2021  . Depression   . History of bladder cancer   . History of pneumonia 1959  . History of prostate cancer   . History of scarlet fever 1956  . History of stroke 2009  . Hx of long term use of blood thinners   . Hypertension   . Long term current use of anticoagulant 03/22/2011  . 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    Medications:  Medications Prior to Admission  Medication Sig Dispense Refill Last Dose  . acetaminophen (TYLENOL) 500 MG tablet Take 500 mg by mouth daily as needed for moderate pain.   Past Week at Unknown time  . albuterol (VENTOLIN HFA) 108 (90 Base) MCG/ACT inhaler Inhale 1 puff into the lungs  every 6 (six) hours as needed for wheezing or shortness of breath.   Past Week at Unknown time  . amLODipine (NORVASC) 5 MG tablet Take 1 tablet (5 mg total) by mouth daily. 90 tablet 1 03/27/2021 at Unknown time  . busPIRone (BUSPAR) 5 MG tablet Take 1 tablet (5 mg total) by mouth 2 (two) times daily. 180 tablet 1 03/27/2021 at Unknown time  . cetirizine (ZYRTEC) 10 MG tablet Take 10 mg by mouth daily.   03/27/2021 at Unknown time  . furosemide (LASIX) 20 MG tablet Take 1 tablet (20 mg total) by mouth daily. 90 tablet 1 03/27/2021 at Unknown time  . lovastatin (MEVACOR) 40 MG tablet Take 40 mg by mouth at bedtime.   03/26/2021 at Unknown time  . Multiple Vitamin (MULTIVITAMIN WITH MINERALS) TABS tablet Take 1 tablet by mouth daily.   03/27/2021 at Unknown time  . pantoprazole (PROTONIX) 40 MG tablet Take 1 tablet (40 mg total) by mouth 2 (two) times daily. 180 tablet 1 03/27/2021 at Unknown time  . ramipril (ALTACE) 10 MG capsule Take 1 capsule (10 mg total) by mouth 2 (two) times daily. (Patient taking differently: Take 20 mg by mouth daily.) 180 capsule 1 03/27/2021 at Unknown time  . sertraline (ZOLOFT) 100 MG tablet Take 1 tablet (100 mg total) by mouth 2 (two) times daily.  180 tablet 1 03/27/2021 at Unknown time  . thiamine 100 MG tablet Take 100 mg by mouth daily.   03/27/2021 at Unknown time  . traZODone (DESYREL) 150 MG tablet Take 150 mg by mouth at bedtime.   03/26/2021 at Unknown time  . warfarin (COUMADIN) 1 MG tablet Take 0.5 tablets (0.5 mg total) by mouth daily. 45 tablet 1 03/26/2021 at 9 pm  . warfarin (COUMADIN) 2 MG tablet Take 1 tablet (2 mg total) by mouth daily. Along with 1/2 of 1 mg tablet for a total of 2.5 mg daily 90 tablet 1 03/26/2021 at 9 pm  . Iron, Ferrous Sulfate, 325 (65 Fe) MG TABS Take 325 mg by mouth in the morning and at bedtime. (Patient not taking: No sig reported) 180 tablet 3 Not Taking at Unknown time  . LORazepam (ATIVAN) 0.5 MG tablet Take 1 tablet (0.5 mg total) by  mouth as directed. One tablet in morning, and two tablets at night. (Patient taking differently: Take 0.5 mg by mouth as directed. One tablet by mouth 3 times a day (tapering)) 90 tablet 0 03/26/21  . lovastatin (MEVACOR) 20 MG tablet Take 1 tablet (20 mg total) by mouth at bedtime. (Patient not taking: No sig reported) 90 tablet 1 Not Taking at Unknown time   Scheduled:  . folic acid  1 mg Oral Daily  . multivitamin with minerals  1 tablet Oral Daily  . thiamine  100 mg Oral Daily   Or  . thiamine  100 mg Intravenous Daily   Assessment: 81 yoM with PMH mech AVR 1985 on chronic warfarin, EtOH abuse, admitted 5/27 for anemia and coffee-ground emesis. Warfarin held on admission; Pharmacy to dose IV heparin for AVR while INR subtherapeutic.    Baseline INR low; surprising given no missed doses and admit w/ UGIB  Prior anticoagulation: warfarin 2.5 mg daily; LD 5/26  INR goal 2.5-3.5 per patient and most recent PCP notes  Per GI notes, suspect "coffee-ground emesis" more likely nocturnal regurgitation based on EGD findings of no bleeding or varices (also notes BUN decreased rather than increased with dropping Hgb)  Significant events:  Today, 03/29/2021: HL 0.1 sub-therapeutic on 1100 units/hr Hgb 8 after receiving PRBC, Plts WNL No bleeding or infusion issues noted   Goal of Therapy: Heparin level 0.3-0.7 units/ml Monitor platelets by anticoagulation protocol: Yes  Plan:  Increase heparin drip to 1350 units/hr  Check heparin level in 8 hrs   Daily CBC, daily heparin level once stable  Monitor for signs of bleeding or thrombosis  Dolly Rias RPh 03/29/2021, 1:42 AM

## 2021-03-29 NOTE — Discharge Summary (Signed)
Physician Discharge Summary  Garrett Terry FIE:[id number] DOB: [birth year] DOA: 03/27/2021  PCP: [name], NP  Admit date: 03/27/2021 Discharge date: 03/29/2021  Recommendations for Outpatient Follow-up:   Anemia multifactorial, etiology is unclear but no evidence of acute bleeding as an inpatient. -- Follow-up with gastroenterology in a month to check Hemoccult status, hemoglobin, and determine whether colonoscopy or some other form of updated colon cancer screening is needed.  Can also decide at that time whether to do repeat endoscopy, to confirm healing of his esophagitis.  Microcytic anemia --Suspect chronic blood loss overall improving.  Oral iron.  Follow-up with gastroenterology.  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  Abnormal colon CT. Focally thickened segment of sigmoid colon in a region of several diverticula but without acute inflammation. Could reflect sequela of prior infection/inflammation though warrants further evaluation with direct visualization is underlying mass lesion is not fully excluded. -- Follow-up with gastroenterology.  T12, L1 compression deformities, old --consider outpatient evaluation for osteoporosis   Follow-up Information    Buccini, Robert, MD. Schedule an appointment as soon as possible for a visit in 1 month(s).   Specialty: Gastroenterology Contact information: [address] [place] [address] [phone]                Discharge Diagnoses: Principal diagnosis is #1 Principal Problem:   GI bleed Active Problems:   Acute blood loss anemia   S/P AVR   Alcohol dependence (HCC)   Microcytic anemia   Compression fracture of T12 vertebra (HCC)   Closed compression fracture of body of L1 vertebra (HCC)   Aortic atherosclerosis  ([place])   Discharge Condition: improved Disposition: return to [facility]  Diet recommendation:  Diet Orders (From admission, onward)    Start     Ordered   03/29/21 0000  Diet - low sodium heart healthy        03/29/21 1235   03/28/21 2047  Diet regular Room service appropriate? Yes; Fluid consistency: Thin  Diet effective now       Question Answer Comment  Room service appropriate? Yes   Fluid consistency: Thin      03/28/21 2046           Filed Weights   03/28/21 0917  Weight: 78 kg    HPI/Hospital Course:   75 year old man PMH including aortic valve replacement on warfarin, alcohol dependence currently in rehab, sent to the emergency department for reported hemoglobin of 6.6 as an outpatient, also reported coffee-ground emesis.  Admitted for acute blood loss anemia secondary to suspected upper GI bleed.  No further bleeding since admission.  Received PRBC with appropriate rise in hemoglobin.  Seen by GI and underwent EGD which was unrevealing for acute bleeding.  Treated with PPI infusion and transition to oral therapy.  Cleared by GI for discharge.  Outpatient follow-up planned.  Anemia multifactorial, etiology is unclear but no evidence of acute bleeding as an inpatient. -- EGD revealed some distal esophagitis but no causative etiology for bleeding. -- Continue PPI twice daily, follow-up with gastroenterology in about a month -- Antireflux instructions give (avoidance of late night meals, avoidance of overeating, avoidance of triggering factors such as alcohol or high fat foods, avoidance of recumbency following meals, etc.) -- Follow-up with gastroenterology in a month to check Hemoccult status, hemoglobin, and determine whether colonoscopy or  some other form of updated colon cancer screening is needed.  Can also decide at that time whether to do repeat endoscopy, to confirm healing of his esophagitis.  Microcytic anemia --Suspect chronic blood loss overall improving.   Oral iron.  Follow-up with gastroenterology.  Alcohol abuse without evidence of chronic liver disease -- Recommend cessation  Probable GERD -- Continue PPI.  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 Caryl Comes cardiology, he recommends against bridging therapy with Lovenox or warfarin.  Per Dr. Caryl Comes okay to discharge home, just resume warfarin. --Consider adding aspirin to his regimen when following up with cardiology -- Patient has ambulatory referral to cardiology in place from PCP.  Abnormal colon CT. Focally thickened segment of sigmoid colon in a region of several diverticula but without acute inflammation. Could reflect sequela of prior infection/inflammation though warrants further evaluation with direct visualization is underlying mass lesion is not fully excluded. --Reportedly up-to-date with colonoscopy 5 years ago or so in [place]. -- Follow-up with gastroenterology.  Abnormal lungs on CT. Few clustered tree-in-bud opacities are present in the right middle and lower lobe, could reflect acute infection or  inflammation including sequela of aspiration in the appropriate clinical setting. --no s/s of infection or pneumonitis --Probably from regurgitation.  No symptoms.  No further evaluation suggested.  Mild bilateral perinephric stranding and some mild bladder wall thickening, recommend correlation with urinalysis to exclude ascending tract infection. --u/a negative.  No urinary symptoms.  No further evaluation suggested.  Alcohol abuse.  No signs, symptoms or stigmata of liver disease. --No evidence of withdrawal.  T12, L1 compression deformities, old --follow-up as an outpatient.  Patient unaware of any particular injury.  Aortic atherosclerosis  --no inpt treatment  Consults:  . GI   Procedures:  EGD Impression:               - No active bleeding, blood in the stomach, or                            likely bleeding  site seen at time of this                            examination.                           - LA Grade B reflux esophagitis with no bleeding.                           - GERD, as evident by free flow of gastric contents                            into the esophagus.                           - 5 cm hiatal hernia.                           - A few papules (nodules) found in the stomach.                            Biopsied.                           -  Normal examined duodenum.  Today's assessment: S: CC: f/u bleeding  Feels fine, no complaints, no bleeding  O: Vitals:  Vitals:   03/29/21 0758 03/29/21 1211  BP: 128/66 131/72  Pulse: 75 79  Resp: 15 17  Temp: 98.2 F (36.8 C) 98.1 F (36.7 C)  SpO2: 98% 99%    Constitutional:  . Appears calm and comfortable ENMT:  . grossly normal hearing  Respiratory:  . CTA bilaterally, no w/r/r.  . Respiratory effort normal.  Cardiovascular:  . RRR, no m/r/g Psychiatric:  . Mental status o Mood, affect appropriate  Hgb up to 8.0  Discharge Instructions  Discharge Instructions    Activity as tolerated - No restrictions   Complete by: As directed    Diet - low sodium heart healthy   Complete by: As directed    Discharge instructions   Complete by: As directed    Call your physician or seek immediate medical attention for pain, bleeding, vomiting or worsening of condition. Avoid late night meals, avoidance of overeating, avoidance of triggering factors such as alcohol or high fat foods, avoidance of recumbency following meals,     Allergies as of 03/29/2021      Reactions   Aspirin    Other reaction(s): Other (See Comments) Can't take due to taking blood thinners   Morphine And Related Other (See Comments)   Other reaction(s): Other (See Comments) Hallucinations   Promethazine Other (See Comments)   Other reaction(s): Other (See Comments) Hallucinations      Medication List    TAKE these medications   acetaminophen  500 MG tablet Commonly known as: TYLENOL Take 500 mg by mouth daily as needed for moderate pain.   albuterol 108 (90 Base) MCG/ACT inhaler Commonly known as: VENTOLIN HFA Inhale 1 puff into the lungs every 6 (six) hours as needed for wheezing or shortness of breath.   amLODipine 5 MG tablet Commonly known as: NORVASC Take 1 tablet (5 mg total) by mouth daily.   busPIRone 5 MG tablet Commonly known as: BUSPAR Take 1 tablet (5 mg total) by mouth 2 (two) times daily.   cetirizine 10 MG tablet Commonly known as: ZYRTEC Take 10 mg by mouth daily.   folic acid 1 MG tablet Commonly known as: FOLVITE Take 1 tablet (1 mg total) by mouth daily. Start taking on: Mar 30, 2021   furosemide 20 MG tablet Commonly known as: LASIX Take 1 tablet (20 mg total) by mouth daily.   Iron (Ferrous Sulfate) 325 (65 Fe) MG Tabs Take 325 mg by mouth in the morning and at bedtime.   LORazepam 0.5 MG tablet Commonly known as: ATIVAN Take 1 tablet (0.5 mg total) by mouth as directed. One tablet in morning, and two tablets at night. What changed: additional instructions   lovastatin 40 MG tablet Commonly known as: MEVACOR Take 40 mg by mouth at bedtime. What changed: Another medication with the same name was removed. Continue taking this medication, and follow the directions you see here.   multivitamin with minerals Tabs tablet Take 1 tablet by mouth daily.   pantoprazole 40 MG tablet Commonly known as: PROTONIX Take 1 tablet (40 mg total) by mouth 2 (two) times daily.   ramipril 10 MG capsule Commonly known as: ALTACE Take 1 capsule (10 mg total) by mouth 2 (two) times daily. What changed:   how much to take  when to take this   sertraline 100 MG tablet Commonly known as: ZOLOFT Take 1 tablet (100 mg  total) by mouth 2 (two) times daily.   thiamine 100 MG tablet Take 100 mg by mouth daily.   traZODone 150 MG tablet Commonly known as: DESYREL Take 150 mg by mouth at bedtime.    warfarin 2 MG tablet Commonly known as: COUMADIN Take 1 tablet (2 mg total) by mouth daily. Along with 1/2 of 1 mg tablet for a total of 2.5 mg daily   warfarin 1 MG tablet Commonly known as: COUMADIN Take 0.5 tablets (0.5 mg total) by mouth daily.      Allergies  Allergen Reactions  . Aspirin     Other reaction(s): Other (See Comments) Can't take due to taking blood thinners  . Morphine And Related Other (See Comments)    Other reaction(s): Other (See Comments) Hallucinations    . Promethazine Other (See Comments)    Other reaction(s): Other (See Comments) Hallucinations     The results of significant diagnostics from this hospitalization (including imaging, microbiology, ancillary and laboratory) are listed below for reference.    Significant Diagnostic Studies: CT ABDOMEN PELVIS WO CONTRAST  Result Date: 03/27/2021 CLINICAL DATA:  Low hemoglobin, hematocrit, history of blood transfusions, fatigue and nausea vomiting EXAM: CT ABDOMEN AND PELVIS WITHOUT CONTRAST TECHNIQUE: Multidetector CT imaging of the abdomen and pelvis was performed following the standard protocol without IV contrast. COMPARISON:  None. FINDINGS: Lower chest: Few clustered tree-in-bud opacities are seen in the right lower lobe (4/32) and right middle lobe (4/1). Calcified granuloma in the left lung base (4/19). Top-normal cardiac size. Calcifications of the mitral annulus and chordae tendinae. Hypoattenuation of the cardiac blood pool may reflect a relative anemia compatible patient's low hemoglobin/hematocrit. Atherosclerotic calcifications of the coronary arteries. Hepatobiliary: Punctate calcifications noted within the hepatic parenchyma. Smooth surface contour. No concerning focal liver lesion. Likely physiologic distension of the gallbladder. No pericholecystic fluid or inflammation. No visible calcified gallstones or biliary ductal dilatation. Pancreas: No pancreatic ductal dilatation or surrounding  inflammatory changes. Spleen: Punctate calcifications throughout the spleen. Splenic size is within normal limits. No concerning focal splenic lesion. Adrenals/Urinary Tract: Normal adrenals. Kidneys are symmetric in size and normally located. Tiny subcentimeter 5 mm rounded subcapsular focus on the anterior interpolar left kidney (2/33) too small to fully characterize though statistically likely benign. No obstructive urolithiasis or hydronephrosis. Mild bilateral perinephric stranding is nonspecific particularly given some mild bladder wall thickening. Keyhole defect at the level of the bladder base may reflect prior TURP or other postsurgical change. Stomach/Bowel: Moderate hiatal hernia despite postsurgical changes at the diaphragmatic hiatus likely reflecting prior hernia surgery. Surgical clips noted towards the gastric antrum and upper midline abdomen as well. Stomach and duodenum are otherwise unremarkable. No small bowel thickening or dilatation accounting for underdistention. Appendix is not visualized. No focal inflammation the vicinity of the cecum to suggest an occult appendicitis. Diffuse pancolonic and terminal ileal diverticulosis. Focally thickened segment of sigmoid colon is noted without significant colonic stranding (5/74). In a region of numerous diverticula. No evidence of bowel obstruction. Vascular/Lymphatic: Atherosclerotic calcifications within the abdominal aorta and branch vessels. No aneurysm or ectasia. No enlarged abdominopelvic lymph nodes. Reproductive: Keyhole defect at the bladder base/prostate, correlate with postsurgical change. Prostate seminal vesicles are otherwise. Other: Fat containing bilateral inguinal hernias, left greater than right. No frank bowel containing hernia though the left inguinal hernia is closely apposed by proximal sigmoid. No free intraperitoneal air or fluid. Postsurgical changes from prior vertical midline incision. Musculoskeletal: Remote appearing  compression deformities T12 (50% height loss) and L1 (  20% height loss). Multilevel degenerative changes are present in the imaged portions of the spine. Additional degenerative changes in the hips and pelvis. IMPRESSION: 1. Focally thickened segment of sigmoid colon in a region of several diverticula but without acute inflammation. Could reflect sequela of prior infection/inflammation though warrants further evaluation with direct visualization is underlying mass lesion is not fully excluded. 2. Additional diverticulosis throughout the terminal ileum and colon. 3. Few clustered tree-in-bud opacities are present in the right middle and lower lobe, could reflect acute infection or inflammation including sequela of aspiration in the appropriate clinical setting. 4. Mild bilateral perinephric stranding and some mild bladder wall thickening, recommend correlation with urinalysis to exclude ascending tract infection. 5. Remote appearing compression deformities of T12, L1, though could correlate for local tenderness to exclude acuity. 6. Hypoattenuating cardiac blood pool compatible with anemia. 7. Evidence of prior granulomatous disease in the chest and abdomen. 8. Keyhole defect at the level of the bladder base/prostate, correlate with prior surgical history. 9. Moderate hiatal hernia despite surgical changes near the diaphragmatic hiatus. 10.  Aortic Atherosclerosis (ICD10-I70.0). Electronically Signed   By: Lovena Le M.D.   On: 03/27/2021 18:26   US Abdomen Complete  Result Date: 03/27/2021 CLINICAL DATA:  Anemia, gastrointestinal bleeding EXAM: ABDOMEN ULTRASOUND COMPLETE COMPARISON:  03/27/2021 FINDINGS: Gallbladder: No gallstones or wall thickening visualized. No sonographic Murphy sign noted by sonographer. Common bile duct: Diameter: 5 mm Liver: No focal lesion identified. Within normal limits in parenchymal echogenicity. Portal vein is patent on color Doppler imaging with normal direction of blood flow  towards the liver. IVC: Not well visualized. Pancreas: Visualized portions of the head and body are unremarkable. Tail is obscured by bowel gas. Spleen: Grossly normal in size. 2.4 x 1.9 x 2.1 cm hyperechoic focus within the inferior aspect of the splenic hilum could reflect a small splenic hemangioma. No other focal abnormalities. Right Kidney: Length: 10.9 cm. Echogenicity within normal limits. 17 mm peripelvic cyst. No hydronephrosis or nephrolithiasis. Left Kidney: Length: 10.3 cm. Normal echogenicity. 9 mm cortical cyst midpole. No hydronephrosis or nephrolithiasis. Abdominal aorta: No aneurysm visualized. Other findings: None. IMPRESSION: 1. Small bilateral renal cysts. 2. Indeterminate 2.4 cm hyperechoic focus inferior splenic hilum, favor hemangioma. 3. Limited visualization of the IVC and pancreas due to bowel gas. Electronically Signed   By: Randa Ngo M.D.   On: 03/27/2021 21:14    Microbiology: Recent Results (from the past 240 hour(s))  Resp Panel by RT-PCR (Flu A&B, Covid) Nasopharyngeal Swab     Status: None   Collection Time: 03/27/21  7:02 PM   Specimen: Nasopharyngeal Swab; Nasopharyngeal(NP) swabs in vial transport medium  Result Value Ref Range Status   SARS Coronavirus 2 by RT PCR NEGATIVE NEGATIVE Final    Comment: (NOTE) SARS-CoV-2 target nucleic acids are NOT DETECTED.  The SARS-CoV-2 RNA is generally detectable in upper respiratory specimens during the acute phase of infection. The lowest concentration of SARS-CoV-2 viral copies this assay can detect is 138 copies/mL. A negative result does not preclude SARS-Cov-2 infection and should not be used as the sole basis for treatment or other patient management decisions. A negative result may occur with  improper specimen collection/handling, submission of specimen other than nasopharyngeal swab, presence of viral mutation(s) within the areas targeted by this assay, and inadequate number of viral copies(<138 copies/mL).  A negative result must be combined with clinical observations, patient history, and epidemiological information. The expected result is Negative.  Fact Sheet for Patients:  EntrepreneurPulse.com.au  Fact Sheet for Healthcare Providers:  IncredibleEmployment.be  This test is no t yet approved or cleared by the [place] FDA and  has been authorized for detection and/or diagnosis of SARS-CoV-2 by FDA under an Emergency Use Authorization (EUA). This EUA will remain  in effect (meaning this test can be used) for the duration of the COVID-19 declaration under Section 564(b)(1) of the Act, 21 U.S.C.section 360bbb-3(b)(1), unless the authorization is terminated  or revoked sooner.       Influenza A by PCR NEGATIVE NEGATIVE Final   Influenza B by PCR NEGATIVE NEGATIVE Final    Comment: (NOTE) The Xpert Xpress SARS-CoV-2/FLU/RSV plus assay is intended as an aid in the diagnosis of influenza from Nasopharyngeal swab specimens and should not be used as a sole basis for treatment. Nasal washings and aspirates are unacceptable for Xpert Xpress SARS-CoV-2/FLU/RSV testing.  Fact Sheet for Patients: EntrepreneurPulse.com.au  Fact Sheet for Healthcare Providers: IncredibleEmployment.be  This test is not yet approved or cleared by the [place] FDA and has been authorized for detection and/or diagnosis of SARS-CoV-2 by FDA under an Emergency Use Authorization (EUA). This EUA will remain in effect (meaning this test can be used) for the duration of the COVID-19 declaration under Section 564(b)(1) of the Act, 21 U.S.C. section 360bbb-3(b)(1), unless the authorization is terminated or revoked.  Performed at [facility], [place] [address] [place], [place] [address]      Labs: Basic Metabolic Panel: Recent Labs  Lab 03/27/21 1532 03/28/21 0905  NA 137 138  K 4.1 4.2  CL 107 109  CO2 25 26   GLUCOSE 104* 94  BUN 20 16  CREATININE 1.37* 1.30*  CALCIUM 8.9 8.1*  MG  --  1.7  PHOS  --  3.2   Liver Function Tests: Recent Labs  Lab 03/27/21 1532  AST 17  ALT 10  ALKPHOS 78  BILITOT 0.5  PROT 6.7  ALBUMIN 3.3*   CBC: Recent Labs  Lab 03/27/21 1532 03/28/21 0905 03/29/21 0041  WBC 8.9 5.1 7.5  NEUTROABS 6.7  --   --   HGB 7.7* 6.5* 8.0*  HCT 26.5* 22.7* 26.9*  MCV 77.3* 77.5* 78.4*  PLT 302 249 239    Principal Problem:   GI bleed Active Problems:   Acute blood loss anemia   S/P AVR   Alcohol dependence (HCC)   Microcytic anemia   Compression fracture of T12 vertebra (HCC)   Closed compression fracture of body of L1 vertebra (HCC)   Aortic atherosclerosis ([place])   Time coordinating discharge: 35 minutes  Signed:  Murray Hodgkins, MD  [facility]  03/29/2021, 1:58 PM

## 2021-03-30 ENCOUNTER — Encounter (HOSPITAL_COMMUNITY): Payer: Self-pay | Admitting: Gastroenterology

## 2021-03-30 LAB — BPAM RBC
Blood Product Expiration Date: 202206262359
ISSUE DATE / TIME: 202205281606
Unit Type and Rh: 5100

## 2021-03-30 LAB — TYPE AND SCREEN
ABO/RH(D): O POS
Antibody Screen: NEGATIVE
Unit division: 0

## 2021-04-01 LAB — SURGICAL PATHOLOGY

## 2021-04-08 ENCOUNTER — Encounter: Payer: Self-pay | Admitting: Family Medicine

## 2021-04-08 ENCOUNTER — Telehealth: Payer: Self-pay

## 2021-04-08 ENCOUNTER — Ambulatory Visit (INDEPENDENT_AMBULATORY_CARE_PROVIDER_SITE_OTHER): Payer: Medicare Other | Admitting: Family Medicine

## 2021-04-08 ENCOUNTER — Other Ambulatory Visit: Payer: Self-pay

## 2021-04-08 VITALS — BP 124/72 | HR 94 | Temp 97.1°F | Ht 67.5 in | Wt 169.4 lb

## 2021-04-08 DIAGNOSIS — S22080A Wedge compression fracture of T11-T12 vertebra, initial encounter for closed fracture: Secondary | ICD-10-CM | POA: Diagnosis not present

## 2021-04-08 DIAGNOSIS — Z7901 Long term (current) use of anticoagulants: Secondary | ICD-10-CM | POA: Diagnosis not present

## 2021-04-08 DIAGNOSIS — F419 Anxiety disorder, unspecified: Secondary | ICD-10-CM | POA: Diagnosis not present

## 2021-04-08 DIAGNOSIS — D62 Acute posthemorrhagic anemia: Secondary | ICD-10-CM

## 2021-04-08 DIAGNOSIS — I38 Endocarditis, valve unspecified: Secondary | ICD-10-CM

## 2021-04-08 LAB — POCT INR
INR: 1.7 — AB (ref 2.0–3.0)
PT: 20.1

## 2021-04-08 LAB — PROTIME-INR
INR: 1.5 — ABNORMAL HIGH
Prothrombin Time: 15.1 s — ABNORMAL HIGH (ref 9.0–11.5)
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
## 2021-04-08 NOTE — Telephone Encounter (Signed)
Patient had a PT/INR checked in office today,04/08/21 and INR was 1.7. Due to INR below patients range Dr.Miller advised to have patient increase warfarin does to 3 mg daily and to return on 04/14/21 to office to have INR rechecked, pt was scheduled for follow-up appointment.

## 2021-04-14 ENCOUNTER — Other Ambulatory Visit: Payer: Self-pay

## 2021-04-14 ENCOUNTER — Encounter: Payer: Self-pay | Admitting: Family Medicine

## 2021-04-14 ENCOUNTER — Ambulatory Visit (INDEPENDENT_AMBULATORY_CARE_PROVIDER_SITE_OTHER): Payer: Medicare Other | Admitting: Family Medicine

## 2021-04-14 VITALS — BP 120/70 | HR 88 | Ht 67.5 in | Wt 167.4 lb

## 2021-04-14 DIAGNOSIS — M25511 Pain in right shoulder: Secondary | ICD-10-CM

## 2021-04-14 DIAGNOSIS — Z7901 Long term (current) use of anticoagulants: Secondary | ICD-10-CM | POA: Diagnosis not present

## 2021-04-14 DIAGNOSIS — G8929 Other chronic pain: Secondary | ICD-10-CM

## 2021-04-14 DIAGNOSIS — M25512 Pain in left shoulder: Secondary | ICD-10-CM

## 2021-04-14 DIAGNOSIS — D62 Acute posthemorrhagic anemia: Secondary | ICD-10-CM | POA: Diagnosis not present

## 2021-04-14 DIAGNOSIS — M25519 Pain in unspecified shoulder: Secondary | ICD-10-CM | POA: Insufficient documentation

## 2021-04-14 DIAGNOSIS — H9190 Unspecified hearing loss, unspecified ear: Secondary | ICD-10-CM

## 2021-04-14 DIAGNOSIS — K922 Gastrointestinal hemorrhage, unspecified: Secondary | ICD-10-CM

## 2021-04-14 LAB — RETICULOCYTES
ABS Retic: 42700 cells/uL (ref 25000–90000)
Retic Ct Pct: 1 %

## 2021-04-14 LAB — POCT INR
INR: 2.2 (ref 2.0–3.0)
PT: 26.6

## 2021-04-14 LAB — CBC
HCT: 32.2 % — ABNORMAL LOW (ref 38.5–50.0)
Hemoglobin: 9.8 g/dL — ABNORMAL LOW (ref 13.2–17.1)
MCH: 23 pg — ABNORMAL LOW (ref 27.0–33.0)
MCHC: 30.4 g/dL — ABNORMAL LOW (ref 32.0–36.0)
MCV: 75.4 fL — ABNORMAL LOW (ref 80.0–100.0)
MPV: 10.5 fL (ref 7.5–12.5)
Platelets: 446 10*3/uL — ABNORMAL HIGH (ref 140–400)
RBC: 4.27 10*6/uL (ref 4.20–5.80)
RDW: 17.3 % — ABNORMAL HIGH (ref 11.0–15.0)
WBC: 6.7 10*3/uL (ref 3.8–10.8)

## 2021-04-14 NOTE — Progress Notes (Signed)
Provider:  [name], MD  Careteam: Patient Care Team: Garrett Chandler, NP as PCP - General (Geriatric Medicine)  PLACE OF SERVICE:  [place]  Advanced Directive information    Allergies  Allergen Reactions   Aspirin     Other reaction(s): Other (See Comments) Can't take due to taking blood thinners   Morphine And Related Other (See Comments)    Other reaction(s): Other (See Comments) Hallucinations     Promethazine Other (See Comments)    Other reaction(s): Other (See Comments) Hallucinations     No chief complaint on file.    HPI: Patient is a 75 y.o. male he returns today primarily for PT/INR.  Since his last visit, he has not continued to drink alcohol.  He still complains of some insomnia but his girlfriend, who accompanies him today says he sleeps all day and then cannot sleep at night.  We talked about the importance of reversing those patterns of sleep. Also requests again to get off of some of the medicine. Also complains of shoulder pain.  He has a history of rotator cuff its not a candidate for surgical repair due to the fact that he is on Coumadin. Also requesting hearing test and audiology evaluation.  Review of Systems:  Review of Systems  Musculoskeletal:  Positive for joint pain.  Psychiatric/Behavioral:  The patient has insomnia.   All other systems reviewed and are negative.  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   Past Surgical  History:  Procedure Laterality Date   AORTIC VALVE REPLACEMENT     mechanical   BIOPSY  03/28/2021   Procedure: BIOPSY;  Surgeon: Garrett Lobo, MD;  [facility];  Service: Endoscopy;;   BLADDER SURGERY  1983   Bladder Cancer Surgery    ESOPHAGOGASTRODUODENOSCOPY (EGD) WITH PROPOFOL N/A 03/28/2021   Procedure: ESOPHAGOGASTRODUODENOSCOPY (EGD) WITH PROPOFOL;  Surgeon: Garrett Lobo, MD;  [facility];  Service: Endoscopy;  Laterality: N/A;   KNEE SURGERY Right 1980   KNEE SURGERY Left 1982   OTHER SURGICAL HISTORY  1992   stomach muscles removed due to Coumadin caused bleeding.   TONSILLECTOMY  1965   Social History:   reports that he has never smoked. His smokeless tobacco use includes snuff. He reports current alcohol use. He reports that he does not use drugs.  No family history on file.  Medications: Patient's Medications  New Prescriptions   No medications on file  Previous Medications   ACETAMINOPHEN (TYLENOL) 500 MG TABLET    Take 500 mg by mouth daily as needed for moderate pain.   ALBUTEROL (VENTOLIN HFA) 108 (90 BASE) MCG/ACT INHALER    Inhale 1 puff into the lungs every 6 (six) hours as needed for wheezing or shortness of breath.   AMLODIPINE (NORVASC) 5 MG TABLET    Take 1 tablet (5 mg total) by mouth daily.  BUSPIRONE (BUSPAR) 5 MG TABLET    Take 1 tablet (5 mg total) by mouth 2 (two) times daily.   CETIRIZINE (ZYRTEC) 10 MG TABLET    Take 10 mg by mouth daily.   FOLIC ACID (FOLVITE) 1 MG TABLET    Take 1 tablet (1 mg total) by mouth daily.   FUROSEMIDE (LASIX) 20 MG TABLET    Take 1 tablet (20 mg total) by mouth daily.   IRON, FERROUS SULFATE, 325 (65 FE) MG TABS    Take 325 mg by mouth in the morning and at bedtime.   LORAZEPAM (ATIVAN) 0.5 MG TABLET    Take 1 tablet (0.5 mg total) by mouth as directed. One tablet in morning, and two tablets at night.   LOVASTATIN (MEVACOR) 40 MG TABLET    Take 40 mg by mouth at bedtime.   MULTIPLE VITAMIN  (MULTIVITAMIN WITH MINERALS) TABS TABLET    Take 1 tablet by mouth daily.   PANTOPRAZOLE (PROTONIX) 40 MG TABLET    Take 1 tablet (40 mg total) by mouth 2 (two) times daily.   RAMIPRIL (ALTACE) 10 MG CAPSULE    Take 1 capsule (10 mg total) by mouth 2 (two) times daily.   SERTRALINE (ZOLOFT) 100 MG TABLET    Take 1 tablet (100 mg total) by mouth 2 (two) times daily.   THIAMINE 100 MG TABLET    Take 100 mg by mouth daily.   TRAZODONE (DESYREL) 150 MG TABLET    Take 150 mg by mouth at bedtime.   WARFARIN (COUMADIN) 1 MG TABLET    Take 0.5 tablets (0.5 mg total) by mouth daily.   WARFARIN (COUMADIN) 2 MG TABLET    Take 1 tablet (2 mg total) by mouth daily. Along with 1/2 of 1 mg tablet for a total of 2.5 mg daily  Modified Medications   No medications on file  Discontinued Medications   No medications on file    Physical Exam:  There were no vitals filed for this visit. There is no height or weight on file to calculate BMI. Wt Readings from Last 3 Encounters:  04/08/21 169 lb 6.4 oz (76.8 kg)  03/28/21 172 lb (78 kg)  03/09/21 176 lb (79.8 kg)    Physical Exam Vitals and nursing note reviewed.  Constitutional:      Appearance: Normal appearance.  Cardiovascular:     Rate and Rhythm: Normal rate.     Heart sounds: Murmur heard.  Pulmonary:     Effort: Pulmonary effort is normal.  Musculoskeletal:     Cervical back: Normal range of motion.  Neurological:     General: No focal deficit present.     Mental Status: He is alert and oriented to person, place, and time.    Labs reviewed: Basic Metabolic Panel: Recent Labs    03/09/21 1222 03/27/21 1532 03/28/21 0905  NA 139 137 138  K 4.1 4.1 4.2  CL 106 107 109  CO2 26 25 26   GLUCOSE 84 104* 94  BUN 15 20 16   CREATININE 1.37* 1.37* 1.30*  CALCIUM 8.7 8.9 8.1*  MG  --   --  1.7  PHOS  --   --  3.2   Liver Function Tests: Recent Labs    03/09/21 1222 03/27/21 1532  AST 20 17  ALT 11 10  ALKPHOS  --  78  BILITOT 0.3  0.5  PROT 6.1 6.7  ALBUMIN  --  3.3*   No results for input(s): LIPASE, AMYLASE  in the last 8760 hours. No results for input(s): AMMONIA in the last 8760 hours. CBC: Recent Labs    03/09/21 1222 03/27/21 1532 03/28/21 0905 03/29/21 0041  WBC 4.9 8.9 5.1 7.5  NEUTROABS 3,244 6.7  --   --   HGB 8.6* 7.7* 6.5* 8.0*  HCT 28.7* 26.5* 22.7* 26.9*  MCV 79.1* 77.3* 77.5* 78.4*  PLT 327 302 249 239   Lipid Panel: Recent Labs    03/09/21 1222  CHOL 185  HDL 67  LDLCALC 91  TRIG 171*  CHOLHDL 2.8   TSH: No results for input(s): TSH in the last 8760 hours. A1C: No results found for: HGBA1C   Assessment/Plan  1. Hearing loss, unspecified hearing loss type, unspecified laterality  - Ambulatory referral to Audiology  2. Acute blood loss anemia He is taking iron.  Need to assess stability of hemoglobin as well as reticulocyte count referral made for hearing evaluation - CBC (no diff) - Reticulocytes  3. Anticoagulant long-term use INR is 2.3 today.  Goal is 2.5-3.5.  Will change Coumadin dose to 2.5 and 3 mg alternating and repeat in 2 weeks - POCT INR  4. Gastrointestinal hemorrhage, unspecified gastrointestinal hemorrhage type See above for plan.  He does have follow-up with GI.    5. Chronic pain of both shoulders Since he is not a surgical candidate we will try medical approach, in other words nitroglycerin patch 0.2 mg one fourth patch daily in hopes of relieving pain and helping healing   Garrett Honey, MD [facility] [phone]

## 2021-04-14 NOTE — Patient Instructions (Signed)
Take pantoprazole (Protonix) as needed Weigh self every day.  If weight is 3 pounds or more than previous day take Lasix (diuretic)

## 2021-04-18 ENCOUNTER — Emergency Department (HOSPITAL_COMMUNITY): Payer: Medicare Other

## 2021-04-18 ENCOUNTER — Encounter (HOSPITAL_COMMUNITY): Payer: Self-pay

## 2021-04-18 ENCOUNTER — Inpatient Hospital Stay (HOSPITAL_COMMUNITY)
Admission: EM | Admit: 2021-04-18 | Discharge: 2021-04-21 | DRG: 378 | Disposition: A | Discharge status: Home / Self Care | Payer: Medicare Other | Attending: Internal Medicine | Admitting: Internal Medicine

## 2021-04-18 ENCOUNTER — Observation Stay (HOSPITAL_COMMUNITY): Payer: Medicare Other

## 2021-04-18 DIAGNOSIS — N179 Acute kidney failure, unspecified: Secondary | ICD-10-CM | POA: Diagnosis present

## 2021-04-18 DIAGNOSIS — E782 Mixed hyperlipidemia: Secondary | ICD-10-CM | POA: Diagnosis present

## 2021-04-18 DIAGNOSIS — G43109 Migraine with aura, not intractable, without status migrainosus: Secondary | ICD-10-CM | POA: Diagnosis present

## 2021-04-18 DIAGNOSIS — K208 Other esophagitis without bleeding: Secondary | ICD-10-CM | POA: Diagnosis present

## 2021-04-18 DIAGNOSIS — F1010 Alcohol abuse, uncomplicated: Secondary | ICD-10-CM | POA: Diagnosis present

## 2021-04-18 DIAGNOSIS — R55 Syncope and collapse: Secondary | ICD-10-CM | POA: Diagnosis not present

## 2021-04-18 DIAGNOSIS — Z8546 Personal history of malignant neoplasm of prostate: Secondary | ICD-10-CM

## 2021-04-18 DIAGNOSIS — K5732 Diverticulitis of large intestine without perforation or abscess without bleeding: Secondary | ICD-10-CM | POA: Diagnosis present

## 2021-04-18 DIAGNOSIS — F17290 Nicotine dependence, other tobacco product, uncomplicated: Secondary | ICD-10-CM | POA: Diagnosis present

## 2021-04-18 DIAGNOSIS — K5733 Diverticulitis of large intestine without perforation or abscess with bleeding: Principal | ICD-10-CM | POA: Diagnosis present

## 2021-04-18 DIAGNOSIS — E611 Iron deficiency: Secondary | ICD-10-CM | POA: Diagnosis present

## 2021-04-18 DIAGNOSIS — Z7901 Long term (current) use of anticoagulants: Secondary | ICD-10-CM

## 2021-04-18 DIAGNOSIS — I959 Hypotension, unspecified: Secondary | ICD-10-CM | POA: Diagnosis present

## 2021-04-18 DIAGNOSIS — I10 Essential (primary) hypertension: Secondary | ICD-10-CM | POA: Diagnosis present

## 2021-04-18 DIAGNOSIS — E871 Hypo-osmolality and hyponatremia: Secondary | ICD-10-CM | POA: Diagnosis not present

## 2021-04-18 DIAGNOSIS — I129 Hypertensive chronic kidney disease with stage 1 through stage 4 chronic kidney disease, or unspecified chronic kidney disease: Secondary | ICD-10-CM | POA: Diagnosis present

## 2021-04-18 DIAGNOSIS — Z8673 Personal history of transient ischemic attack (TIA), and cerebral infarction without residual deficits: Secondary | ICD-10-CM

## 2021-04-18 DIAGNOSIS — Z952 Presence of prosthetic heart valve: Secondary | ICD-10-CM

## 2021-04-18 DIAGNOSIS — K2100 Gastro-esophageal reflux disease with esophagitis, without bleeding: Secondary | ICD-10-CM | POA: Diagnosis present

## 2021-04-18 DIAGNOSIS — Z20822 Contact with and (suspected) exposure to covid-19: Secondary | ICD-10-CM | POA: Diagnosis present

## 2021-04-18 DIAGNOSIS — K922 Gastrointestinal hemorrhage, unspecified: Secondary | ICD-10-CM | POA: Diagnosis present

## 2021-04-18 DIAGNOSIS — Z8551 Personal history of malignant neoplasm of bladder: Secondary | ICD-10-CM

## 2021-04-18 DIAGNOSIS — D62 Acute posthemorrhagic anemia: Secondary | ICD-10-CM | POA: Diagnosis present

## 2021-04-18 DIAGNOSIS — F1011 Alcohol abuse, in remission: Secondary | ICD-10-CM | POA: Diagnosis present

## 2021-04-18 DIAGNOSIS — N1831 Chronic kidney disease, stage 3a: Secondary | ICD-10-CM | POA: Diagnosis present

## 2021-04-18 DIAGNOSIS — Z66 Do not resuscitate: Secondary | ICD-10-CM | POA: Diagnosis present

## 2021-04-18 DIAGNOSIS — Z79899 Other long term (current) drug therapy: Secondary | ICD-10-CM

## 2021-04-18 DIAGNOSIS — R112 Nausea with vomiting, unspecified: Secondary | ICD-10-CM | POA: Diagnosis present

## 2021-04-18 DIAGNOSIS — F411 Generalized anxiety disorder: Secondary | ICD-10-CM | POA: Diagnosis present

## 2021-04-18 DIAGNOSIS — I440 Atrioventricular block, first degree: Secondary | ICD-10-CM | POA: Diagnosis present

## 2021-04-18 LAB — RAPID URINE DRUG SCREEN, HOSP PERFORMED
Amphetamines: NOT DETECTED
Barbiturates: NOT DETECTED
Benzodiazepines: POSITIVE — AB
Cocaine: NOT DETECTED
Opiates: NOT DETECTED
Tetrahydrocannabinol: NOT DETECTED

## 2021-04-18 LAB — COMPREHENSIVE METABOLIC PANEL
ALT: 11 U/L (ref 0–44)
AST: 15 U/L (ref 15–41)
Albumin: 2.9 g/dL — ABNORMAL LOW (ref 3.5–5.0)
Alkaline Phosphatase: 77 U/L (ref 38–126)
Anion gap: 5 (ref 5–15)
BUN: 37 mg/dL — ABNORMAL HIGH (ref 8–23)
CO2: 22 mmol/L (ref 22–32)
Calcium: 8.5 mg/dL — ABNORMAL LOW (ref 8.9–10.3)
Chloride: 106 mmol/L (ref 98–111)
Creatinine, Ser: 1.6 mg/dL — ABNORMAL HIGH (ref 0.61–1.24)
GFR, Estimated: 45 mL/min — ABNORMAL LOW (ref >60)
Glucose, Bld: 116 mg/dL — ABNORMAL HIGH (ref 70–99)
Potassium: 4.2 mmol/L (ref 3.5–5.1)
Sodium: 133 mmol/L — ABNORMAL LOW (ref 135–145)
Total Bilirubin: 0.5 mg/dL (ref 0.3–1.2)
Total Protein: 5.5 g/dL — ABNORMAL LOW (ref 6.5–8.1)

## 2021-04-18 LAB — CBC WITH DIFFERENTIAL/PLATELET
Abs Immature Granulocytes: 0.07 10*3/uL (ref 0.00–0.07)
Basophils Absolute: 0.1 10*3/uL (ref 0.0–0.1)
Basophils Relative: 0 %
Eosinophils Absolute: 0.2 10*3/uL (ref 0.0–0.5)
Eosinophils Relative: 2 %
HCT: 27.8 % — ABNORMAL LOW (ref 39.0–52.0)
Hemoglobin: 8.3 g/dL — ABNORMAL LOW (ref 13.0–17.0)
Immature Granulocytes: 1 %
Lymphocytes Relative: 5 %
Lymphs Abs: 0.8 10*3/uL (ref 0.7–4.0)
MCH: 23.4 pg — ABNORMAL LOW (ref 26.0–34.0)
MCHC: 29.9 g/dL — ABNORMAL LOW (ref 30.0–36.0)
MCV: 78.5 fL — ABNORMAL LOW (ref 80.0–100.0)
Monocytes Absolute: 0.8 10*3/uL (ref 0.1–1.0)
Monocytes Relative: 6 %
Neutro Abs: 12.9 10*3/uL — ABNORMAL HIGH (ref 1.7–7.7)
Neutrophils Relative %: 86 %
Platelets: 345 10*3/uL (ref 150–400)
RBC: 3.54 MIL/uL — ABNORMAL LOW (ref 4.22–5.81)
RDW: 19.8 % — ABNORMAL HIGH (ref 11.5–15.5)
WBC: 14.8 10*3/uL — ABNORMAL HIGH (ref 4.0–10.5)
nRBC: 0 % (ref 0.0–0.2)

## 2021-04-18 LAB — CBC
HCT: 26.2 % — ABNORMAL LOW (ref 39.0–52.0)
Hemoglobin: 7.8 g/dL — ABNORMAL LOW (ref 13.0–17.0)
MCH: 23.4 pg — ABNORMAL LOW (ref 26.0–34.0)
MCHC: 29.8 g/dL — ABNORMAL LOW (ref 30.0–36.0)
MCV: 78.4 fL — ABNORMAL LOW (ref 80.0–100.0)
Platelets: 268 10*3/uL (ref 150–400)
RBC: 3.34 MIL/uL — ABNORMAL LOW (ref 4.22–5.81)
RDW: 19.9 % — ABNORMAL HIGH (ref 11.5–15.5)
WBC: 9.6 10*3/uL (ref 4.0–10.5)
nRBC: 0 % (ref 0.0–0.2)

## 2021-04-18 LAB — RESP PANEL BY RT-PCR (FLU A&B, COVID) ARPGX2
Influenza A by PCR: NEGATIVE
Influenza B by PCR: NEGATIVE
SARS Coronavirus 2 by RT PCR: NEGATIVE

## 2021-04-18 LAB — URINALYSIS, COMPLETE (UACMP) WITH MICROSCOPIC
Bacteria, UA: NONE SEEN
Bilirubin Urine: NEGATIVE
Glucose, UA: NEGATIVE mg/dL
Hgb urine dipstick: NEGATIVE
Ketones, ur: NEGATIVE mg/dL
Leukocytes,Ua: NEGATIVE
Nitrite: NEGATIVE
Protein, ur: NEGATIVE mg/dL
Specific Gravity, Urine: 1.018 (ref 1.005–1.030)
pH: 5 (ref 5.0–8.0)

## 2021-04-18 LAB — CBG MONITORING, ED: Glucose-Capillary: 84 mg/dL (ref 70–99)

## 2021-04-18 LAB — TROPONIN I (HIGH SENSITIVITY)
Troponin I (High Sensitivity): 10 ng/L (ref <18)
Troponin I (High Sensitivity): 10 ng/L (ref <18)

## 2021-04-18 LAB — PROTIME-INR
INR: 1.8 — ABNORMAL HIGH (ref 0.8–1.2)
Prothrombin Time: 20.6 seconds — ABNORMAL HIGH (ref 11.4–15.2)

## 2021-04-18 LAB — C-REACTIVE PROTEIN: CRP: 0.6 mg/dL (ref <1.0)

## 2021-04-18 LAB — ETHANOL: Alcohol, Ethyl (B): <10 mg/dL (ref <10)

## 2021-04-18 MED ORDER — ONDANSETRON HCL 4 MG PO TABS: 4.0000 mg | ORAL_TABLET | Freq: Four times a day (QID) | ORAL | Status: DC | PRN

## 2021-04-18 MED ORDER — PRAVASTATIN SODIUM 40 MG PO TABS
40.0000 mg | ORAL_TABLET | Freq: Every day | ORAL | Status: DC
  Administered 2021-04-19 – 2021-04-20 (×2): 40 mg via ORAL
  Filled 2021-04-18 (×2): qty 1

## 2021-04-18 MED ORDER — TRAZODONE HCL 50 MG PO TABS
150.0000 mg | ORAL_TABLET | Freq: Every day | ORAL | Status: DC
  Administered 2021-04-18 – 2021-04-20 (×3): 150 mg via ORAL
  Filled 2021-04-18 (×3): qty 1

## 2021-04-18 MED ORDER — SERTRALINE HCL 100 MG PO TABS
100.0000 mg | ORAL_TABLET | Freq: Two times a day (BID) | ORAL | Status: DC
  Administered 2021-04-18 – 2021-04-21 (×5): 100 mg via ORAL
  Filled 2021-04-18 (×7): qty 1

## 2021-04-18 MED ORDER — WARFARIN - PHARMACIST DOSING INPATIENT: Freq: Every day | Status: DC

## 2021-04-18 MED ORDER — DIPHENHYDRAMINE HCL 50 MG/ML IJ SOLN
25.0000 mg | Freq: Once | INTRAMUSCULAR | Status: AC
  Administered 2021-04-18: 25 mg via INTRAVENOUS
  Filled 2021-04-18: qty 1

## 2021-04-18 MED ORDER — ADULT MULTIVITAMIN W/MINERALS CH
1.0000 | ORAL_TABLET | Freq: Every day | ORAL | Status: DC
  Administered 2021-04-19 – 2021-04-21 (×3): 1 via ORAL
  Filled 2021-04-18 (×3): qty 1

## 2021-04-18 MED ORDER — IOHEXOL 350 MG/ML SOLN
100.0000 mL | Freq: Once | INTRAVENOUS | Status: AC | PRN
  Administered 2021-04-18: 100 mL via INTRAVENOUS

## 2021-04-18 MED ORDER — SODIUM CHLORIDE 0.9 % IV SOLN: INTRAVENOUS | Status: AC

## 2021-04-18 MED ORDER — LACTATED RINGERS IV BOLUS
1000.0000 mL | Freq: Once | INTRAVENOUS | Status: AC
  Administered 2021-04-18: 1000 mL via INTRAVENOUS

## 2021-04-18 MED ORDER — ACETAMINOPHEN 325 MG PO TABS
650.0000 mg | ORAL_TABLET | Freq: Four times a day (QID) | ORAL | Status: DC | PRN
  Administered 2021-04-19 – 2021-04-20 (×2): 650 mg via ORAL
  Filled 2021-04-18 (×2): qty 2

## 2021-04-18 MED ORDER — CIPROFLOXACIN IN D5W 400 MG/200ML IV SOLN: 400.0000 mg | Freq: Two times a day (BID) | INTRAVENOUS | Status: DC

## 2021-04-18 MED ORDER — WARFARIN SODIUM 5 MG PO TABS
5.0000 mg | ORAL_TABLET | Freq: Once | ORAL | Status: AC
  Administered 2021-04-18: 5 mg via ORAL
  Filled 2021-04-18 (×2): qty 1

## 2021-04-18 MED ORDER — POLYETHYLENE GLYCOL 3350 17 G PO PACK
17.0000 g | PACK | Freq: Every day | ORAL | Status: DC | PRN
  Administered 2021-04-19: 17 g via ORAL
  Filled 2021-04-18: qty 1

## 2021-04-18 MED ORDER — SODIUM CHLORIDE 0.9 % IV SOLN
1.0000 g | INTRAVENOUS | Status: DC
  Administered 2021-04-18 – 2021-04-20 (×2): 1 g via INTRAVENOUS
  Filled 2021-04-18 (×3): qty 10

## 2021-04-18 MED ORDER — ACETAMINOPHEN 650 MG RE SUPP: 650.0000 mg | Freq: Four times a day (QID) | RECTAL | Status: DC | PRN

## 2021-04-18 MED ORDER — METRONIDAZOLE 500 MG/100ML IV SOLN
500.0000 mg | Freq: Three times a day (TID) | INTRAVENOUS | Status: DC
  Administered 2021-04-18 – 2021-04-20 (×6): 500 mg via INTRAVENOUS
  Filled 2021-04-18 (×6): qty 100

## 2021-04-18 MED ORDER — ONDANSETRON HCL 4 MG/2ML IJ SOLN: 4.0000 mg | Freq: Four times a day (QID) | INTRAMUSCULAR | Status: DC | PRN

## 2021-04-18 MED ORDER — AMLODIPINE BESYLATE 5 MG PO TABS
5.0000 mg | ORAL_TABLET | Freq: Every day | ORAL | Status: DC
  Administered 2021-04-19: 5 mg via ORAL
  Filled 2021-04-18: qty 1

## 2021-04-18 MED ORDER — METOCLOPRAMIDE HCL 5 MG/ML IJ SOLN
10.0000 mg | Freq: Once | INTRAMUSCULAR | Status: AC
  Administered 2021-04-18: 10 mg via INTRAVENOUS
  Filled 2021-04-18: qty 2

## 2021-04-18 MED ORDER — FENTANYL CITRATE (PF) 100 MCG/2ML IJ SOLN
50.0000 ug | Freq: Once | INTRAMUSCULAR | Status: AC
  Administered 2021-04-18: 50 ug via INTRAVENOUS
  Filled 2021-04-18: qty 2

## 2021-04-18 MED ORDER — LORATADINE 10 MG PO TABS
10.0000 mg | ORAL_TABLET | Freq: Every day | ORAL | Status: DC
  Administered 2021-04-19 – 2021-04-21 (×3): 10 mg via ORAL
  Filled 2021-04-18 (×3): qty 1

## 2021-04-18 MED ORDER — LORAZEPAM 0.5 MG PO TABS
0.5000 mg | ORAL_TABLET | Freq: Three times a day (TID) | ORAL | Status: DC
  Administered 2021-04-18 – 2021-04-21 (×7): 0.5 mg via ORAL
  Filled 2021-04-18 (×8): qty 1

## 2021-04-18 MED ORDER — PANTOPRAZOLE SODIUM 40 MG IV SOLR
40.0000 mg | Freq: Two times a day (BID) | INTRAVENOUS | Status: DC
  Administered 2021-04-18 – 2021-04-19 (×2): 40 mg via INTRAVENOUS
  Filled 2021-04-18 (×2): qty 40

## 2021-04-18 MED ORDER — THIAMINE HCL 100 MG PO TABS
100.0000 mg | ORAL_TABLET | Freq: Every day | ORAL | Status: DC
  Administered 2021-04-19 – 2021-04-21 (×3): 100 mg via ORAL
  Filled 2021-04-18 (×3): qty 1

## 2021-04-18 MED ORDER — ALBUTEROL SULFATE HFA 108 (90 BASE) MCG/ACT IN AERS
1.0000 | INHALATION_SPRAY | Freq: Four times a day (QID) | RESPIRATORY_TRACT | Status: DC | PRN
  Filled 2021-04-18: qty 6.7

## 2021-04-18 NOTE — ED Notes (Signed)
Pt returned from CT at this time.  

## 2021-04-18 NOTE — ED Provider Notes (Signed)
[facility] EMERGENCY DEPARTMENT Provider Note   CSN: [id number] Arrival date & time: 04/18/21  1525     History Chief Complaint  Patient presents with   Loss of Consciousness    Garrett Terry is a 75 y.o. male.  The history is provided by the patient.  Loss of Consciousness Episode history:  Single Most recent episode:  Today Duration:  10 minutes Timing:  Constant Progression:  Resolved Chronicity:  New Context: normal activity   Context: not blood draw, not bowel movement, not dehydration, not exertion, not inactivity, not medication change, not sight of blood, not sitting down, not standing up and not urination   Witnessed: yes   Relieved by:  Nothing Worsened by:  Nothing Ineffective treatments:  None tried Associated symptoms: dizziness, headaches, nausea, vomiting and weakness (generalized)   Associated symptoms: no anxiety, no chest pain, no confusion, no diaphoresis, no difficulty breathing, no fever, no focal sensory loss, no focal weakness, no malaise/fatigue, no palpitations, no recent fall, no recent injury, no recent surgery, no rectal bleeding, no seizures, no shortness of breath and no visual change   Headaches:    Severity:  Severe   Onset quality:  Sudden   Duration:  1 hour   Timing:  Constant   Progression:  Unchanged   Chronicity:  New Risk factors: vascular disease (on warfarin)       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   Closed compression fracture of body of L1 vertebra (HCC) 06/25/538   Complication of anesthesia    PONV   Compression fracture of T12 vertebra (HCC) 03/28/2021   Depression    History of bladder cancer    History of pneumonia 1959   History of prostate cancer    History of scarlet fever 1956   History of stroke 2009   Hx of long term use of blood thinners    Hypertension    Long term current use of anticoagulant 03/22/2011   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     Patient Active Problem List   Diagnosis Date Noted   GI bleeding 04/19/2021   Syncope, vasovagal 04/18/2021   Nausea and vomiting 04/18/2021   Diverticulitis of sigmoid colon 04/18/2021   [place] grade B esophagitis 04/18/2021   Migraine headache with aura 04/18/2021   Alcohol abuse 04/18/2021   Generalized anxiety disorder 04/18/2021   Essential hypertension 04/18/2021   Shoulder pain 04/14/2021   Microcytic anemia 03/28/2021   Compression fracture of T12 vertebra ([place]) 03/28/2021   Closed compression fracture of body of L1 vertebra ([place]) 03/28/2021   Aortic atherosclerosis ([place]) 03/28/2021   GI bleed 03/27/2021   Acute blood loss anemia 03/27/2021   Hx of mechanical aortic valve replacement 03/27/2021   Alcohol dependence ([place]) 03/27/2021   Anxiety 10/27/2020   Asthma 05/09/2020   Erectile dysfunction 02/07/2020   Dysphagia 09/19/2016   History of cerebrovascular accident 07/23/2015   Allergic rhinitis 06/30/2015   Long term current use of anticoagulant 03/22/2011   Gastroesophageal reflux disease 07/07/2010   Mixed hyperlipidemia 07/07/2010   Heart valve disorder 07/07/2010    Past Surgical History:  Procedure Laterality Date   AORTIC VALVE REPLACEMENT     mechanical   BIOPSY  03/28/2021   Procedure: BIOPSY;  Surgeon: Ronald Lobo, MD;  Location: [facility];  Service: Endoscopy;;   BLADDER SURGERY  1983   Bladder Cancer Surgery    ESOPHAGOGASTRODUODENOSCOPY (EGD) WITH PROPOFOL N/A 03/28/2021  Procedure: ESOPHAGOGASTRODUODENOSCOPY (EGD) WITH PROPOFOL;  Surgeon: Ronald Lobo, MD;  Location: [facility];  Service: Endoscopy;  Laterality: N/A;   KNEE SURGERY Right 1980   KNEE SURGERY Left 1982   OTHER SURGICAL HISTORY  1992   stomach muscles removed due to Coumadin caused bleeding.   TONSILLECTOMY  1965       Family History  Problem Relation Age of Onset   Heart disease Neg Hx     Social History   Tobacco Use   Smoking status: Never    Smokeless tobacco: Current    Types: Snuff   Tobacco comments:    4-5 pouches a day  Vaping Use   Vaping Use: Never used  Substance Use Topics   Alcohol use: Yes    Comment: 15-20 drinks per week.   Drug use: Never    Home Medications Prior to Admission medications   Medication Sig Start Date End Date Taking? Authorizing Provider  acetaminophen (TYLENOL) 500 MG tablet Take 500 mg by mouth daily as needed for moderate pain.   Yes [provider]  albuterol (VENTOLIN HFA) 108 (90 Base) MCG/ACT inhaler Inhale 1 puff into the lungs every 6 (six) hours as needed for wheezing or shortness of breath.   Yes [provider]  amLODipine (NORVASC) 5 MG tablet Take 1 tablet (5 mg total) by mouth daily. 03/10/21  Yes [name], NP  folic acid (FOLVITE) 1 MG tablet Take 1 tablet (1 mg total) by mouth daily. 03/30/21  Yes Samuella Cota, MD  furosemide (LASIX) 20 MG tablet Take 1 tablet (20 mg total) by mouth daily. Patient taking differently: Take 20 mg by mouth daily as needed for fluid or edema (3lb weight gain). 03/10/21  Yes [name], NP  Iron, Ferrous Sulfate, 325 (65 Fe) MG TABS Take 325 mg by mouth in the morning and at bedtime. 03/16/21  Yes [name], NP  LORazepam (ATIVAN) 0.5 MG tablet Take 1 tablet (0.5 mg total) by mouth as directed. One tablet in morning, and two tablets at night. Patient taking differently: Take 0.5 mg by mouth See admin instructions. 0.5mg  in the morning 1mg  in the evening 03/10/21  Yes Eubanks, Carlos American, NP  lovastatin (MEVACOR) 40 MG tablet Take 40 mg by mouth at bedtime.   Yes [provider]  pantoprazole (PROTONIX) 40 MG tablet Take 1 tablet (40 mg total) by mouth 2 (two) times daily. 03/10/21  Yes [name], NP  ramipril (ALTACE) 10 MG capsule Take 1 capsule (10 mg total) by mouth 2 (two) times daily. Patient taking differently: Take 20 mg by mouth daily. 03/10/21  Yes [name], NP   sertraline (ZOLOFT) 100 MG tablet Take 1 tablet (100 mg total) by mouth 2 (two) times daily. 03/10/21  Yes [name], NP  thiamine 100 MG tablet Take 100 mg by mouth daily.   Yes [provider]  warfarin (COUMADIN) 1 MG tablet Take 0.5 tablets (0.5 mg total) by mouth daily. Patient taking differently: Take 0.5-1 mg by mouth See admin instructions. Take 2.5mg  daily alternating with 3mg  daily. Take 0.5mg  with 2mg  tablet alternating with 1mg  with 2mg  tablet. 03/10/21  Yes [name], NP  warfarin (COUMADIN) 2 MG tablet Take 1 tablet (2 mg total) by mouth daily. Along with 1/2 of 1 mg tablet for a total of 2.5 mg daily Patient taking differently: Take 2 mg by mouth See admin instructions. Take 2.5mg  daily alternating with 3mg  daily.  2mg  tablet and 0.5mg  alternating with 2mg  tablet and 1mg  tablet. 03/10/21  Yes [name], NP  busPIRone (BUSPAR) 5 MG tablet Take 1 tablet (5 mg total) by mouth 2 (two) times daily. Patient not taking: No sig reported 03/10/21   [name], NP    Allergies    Aspirin, Morphine and related, and Promethazine  Review of Systems   Review of Systems  Constitutional:  Negative for chills, diaphoresis, fever and malaise/fatigue.  HENT:  Negative for ear pain and sore throat.   Eyes:  Negative for pain and visual disturbance.  Respiratory:  Negative for cough and shortness of breath.   Cardiovascular:  Positive for syncope. Negative for chest pain and palpitations.  Gastrointestinal:  Positive for nausea and vomiting. Negative for abdominal pain.  Genitourinary:  Negative for dysuria and hematuria.  Musculoskeletal:  Negative for arthralgias and back pain.  Skin:  Negative for color change and rash.  Neurological:  Positive for dizziness, weakness (generalized) and headaches. Negative for focal weakness, seizures and syncope.  Psychiatric/Behavioral:  Negative for confusion.   All other systems reviewed and are  negative.  Physical Exam Updated Vital Signs BP (!) 102/57   Pulse 85   Temp 98.3 F (36.8 C) (Oral)   Resp 20   SpO2 99%   Physical Exam Vitals and nursing note reviewed.  Constitutional:      Appearance: He is well-developed. He is ill-appearing.  HENT:     Head: Normocephalic and atraumatic.     Comments: Atraumatic head and neck exam Eyes:     General: Vision grossly intact. No visual field deficit.    Extraocular Movements: Extraocular movements intact.     Conjunctiva/sclera: Conjunctivae normal.     Pupils: Pupils are equal.     Right eye: Pupil is round and reactive.     Left eye: Pupil is round and reactive.  Neck:   Cardiovascular:     Rate and Rhythm: Normal rate and regular rhythm.     Pulses:          Radial pulses are 2+ on the right side and 2+ on the left side.     Heart sounds: Murmur heard.  Systolic murmur is present.  Pulmonary:     Effort: Pulmonary effort is normal. No respiratory distress.     Breath sounds: Normal breath sounds.  Abdominal:     General: Abdomen is flat.     Palpations: Abdomen is soft.     Tenderness: There is no abdominal tenderness.  Musculoskeletal:     Cervical back: Neck supple.     Right lower leg: No edema.     Left lower leg: No edema.  Skin:    General: Skin is warm and dry.  Neurological:     Mental Status: He is alert and oriented to person, place, and time.     GCS: GCS eye subscore is 4. GCS verbal subscore is 5. GCS motor subscore is 6.     Cranial Nerves: Cranial nerves are intact. No cranial nerve deficit, dysarthria or facial asymmetry.     Sensory: Sensation is intact. No sensory deficit.     Motor: Motor function is intact. No weakness, tremor, abnormal muscle tone or pronator drift.     Comments:  Lying in bed with eyes closed. He will open his eyes and is interactive. Oriented to person, place, time and situation.  Nonfocal neurologic exam.  Psychiatric:        Behavior: Behavior  is cooperative.     ED Results / Procedures / Treatments   Labs (all labs ordered are listed, but only abnormal results are displayed) Labs Reviewed  COMPREHENSIVE METABOLIC PANEL - Abnormal; Notable for the following components:      Result Value   Sodium 133 (*)    Glucose, Bld 116 (*)    BUN 37 (*)    Creatinine, Ser 1.60 (*)    Calcium 8.5 (*)    Total Protein 5.5 (*)    Albumin 2.9 (*)    GFR, Estimated 45 (*)    All other components within normal limits  CBC WITH DIFFERENTIAL/PLATELET - Abnormal; Notable for the following components:   WBC 14.8 (*)    RBC 3.54 (*)    Hemoglobin 8.3 (*)    HCT 27.8 (*)    MCV 78.5 (*)    MCH 23.4 (*)    MCHC 29.9 (*)    RDW 19.8 (*)    Neutro Abs 12.9 (*)    All other components within normal limits  URINALYSIS, COMPLETE (UACMP) WITH MICROSCOPIC - Abnormal; Notable for the following components:   Color, Urine STRAW (*)    All other components within normal limits  RAPID URINE DRUG SCREEN, HOSP PERFORMED - Abnormal; Notable for the following components:   Benzodiazepines POSITIVE (*)    All other components within normal limits  PROTIME-INR - Abnormal; Notable for the following components:   Prothrombin Time 20.6 (*)    INR 1.8 (*)    All other components within normal limits  CBC - Abnormal; Notable for the following components:   RBC 3.34 (*)    Hemoglobin 7.8 (*)    HCT 26.2 (*)    MCV 78.4 (*)    MCH 23.4 (*)    MCHC 29.8 (*)    RDW 19.9 (*)    All other components within normal limits  CBC - Abnormal; Notable for the following components:   RBC 2.99 (*)    Hemoglobin 7.1 (*)    HCT 23.0 (*)    MCV 76.9 (*)    MCH 23.7 (*)    RDW 20.0 (*)    All other components within normal limits  COMPREHENSIVE METABOLIC PANEL - Abnormal; Notable for the following components:   CO2 21 (*)    BUN 28 (*)    Creatinine, Ser 1.40 (*)    Calcium 8.5 (*)    Total Protein 4.6 (*)    Albumin 2.5 (*)    AST 12 (*)    GFR, Estimated 53 (*)    All  other components within normal limits  PROTIME-INR - Abnormal; Notable for the following components:   Prothrombin Time 21.9 (*)    INR 1.9 (*)    All other components within normal limits  RESP PANEL BY RT-PCR (FLU A&B, COVID) ARPGX2  CULTURE, BLOOD (ROUTINE X 2)  CULTURE, BLOOD (ROUTINE X 2)  ETHANOL  C-REACTIVE PROTEIN  MAGNESIUM  PHOSPHORUS  APTT  PROTIME-INR  BASIC METABOLIC PANEL  CBC WITH DIFFERENTIAL/PLATELET  CBG MONITORING, ED  TYPE AND SCREEN  PREPARE RBC (CROSSMATCH)  TROPONIN I (HIGH SENSITIVITY)  TROPONIN I (HIGH SENSITIVITY)    EKG EKG Interpretation  Date/Time:  Saturday April 18 2021 15:43:07 EDT Ventricular Rate:  86 PR Interval:  214 QRS Duration: 120 QT Interval:  398 QTC Calculation: 476 R Axis:   -4 Text Interpretation: Sinus rhythm Borderline prolonged PR interval Incomplete left bundle branch block No significant change from Mar 27 2021 Confirmed by Octaviano Glow [phone]) on 04/18/2021 3:53:33 PM Also confirmed by Octaviano Glow [phone]), editor Mariam Dollar [phone]  on 04/19/2021 2:19:48 PM  Radiology CT Head Wo Contrast  Result Date: 04/18/2021 CLINICAL DATA:  Headache. EXAM: CT HEAD WITHOUT CONTRAST TECHNIQUE: Contiguous axial images were obtained from the base of the skull through the vertex without intravenous contrast. COMPARISON:  None. FINDINGS: Brain: No evidence of acute hemorrhage, hydrocephalus, extra-axial collection or mass lesion/mass effect. Small areas of hypoattenuation in the deep white matter of the frontal lobes, left basal ganglion left thalamus may represent age-indeterminate lacunar infarcts or asymmetric white matter microangiopathy. Vascular: Calcific atherosclerotic disease of the intra cavernous carotid arteries. Skull: Normal. Negative for fracture or focal lesion. Sinuses/Orbits: No acute finding. Other: None. IMPRESSION: Small areas of hypoattenuation in the deep white matter of the frontal lobes, left basal ganglia and left  thalamus may represent age-indeterminate lacunar infarcts or asymmetric white matter microangiopathy. No evidence of intracranial hemorrhage. Electronically Signed   By: Fidela Salisbury M.D.   On: 04/18/2021 16:25   MR BRAIN WO CONTRAST  Result Date: 04/19/2021 CLINICAL DATA:  Headache EXAM: MRI HEAD WITHOUT CONTRAST TECHNIQUE: Multiplanar, multiecho pulse sequences of the brain and surrounding structures were obtained without intravenous contrast. COMPARISON:  None. FINDINGS: Brain: No acute infarct, mass effect or extra-axial collection. Foci of chronic microhemorrhage in the right frontal and left parietal lobes. There is multifocal hyperintense T2-weighted signal within the white matter. Generalized volume loss without a clear lobar predilection. 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 The midline structures are normal. Vascular: Major flow voids are preserved. Skull and upper cervical spine: Normal calvarium and skull base. Visualized upper cervical spine and soft tissues are normal. Sinuses/Orbits:No paranasal sinus fluid levels or advanced mucosal thickening. No mastoid or middle ear effusion. Normal orbits. IMPRESSION: 1. No acute intracranial abnormality. 2. 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 Electronically Signed   By: Ulyses Jarred M.D.   On: 04/19/2021 01:49   DG Chest Portable 1 View  Result Date: 04/18/2021 CLINICAL DATA:  Syncope EXAM: PORTABLE CHEST 1 VIEW COMPARISON:  None. FINDINGS: Prior median sternotomy and cardiac valve replacement. Heart size is upper limits of normal. No focal airspace consolidation, pleural effusion, or pneumothorax. Advanced degenerative changes of the right shoulder. IMPRESSION: No active disease. Electronically Signed   By: Davina Poke D.O.   On: 04/18/2021 16:32   ECHOCARDIOGRAM COMPLETE  Result Date: 04/19/2021    ECHOCARDIOGRAM REPORT   Patient Name:   CLARENCE COGSWELL [facility] Date of Exam: 04/19/2021  Medical Rec #:  [id number]         Height:       67.5 in Accession #:    [id number]        Weight:       167.4 lb Date of Birth:  Sep 20, 1946         BSA:          1.885 m Patient Age:    69 years          BP:           94/56 mmHg Patient Gender: M                 HR:           83 bpm. Exam Location:  Inpatient Procedure: 2D Echo Indications:    syncope  History:        Patient has prior history of Echocardiogram examinations.  Risk                 Factors:Dyslipidemia.                 Aortic Valve: mechanical valve is present in the aortic                 position.  Sonographer:    Johny Chess Referring Phys: [id number] [place]  1. Left ventricular ejection fraction, by estimation, is 60 to 65%. The left ventricle has normal function. The left ventricle has no regional wall motion abnormalities. There is mild left ventricular hypertrophy. Left ventricular diastolic parameters are consistent with Grade I diastolic dysfunction (impaired relaxation).  2. Right ventricular systolic function is normal. The right ventricular size is normal. There is normal pulmonary artery systolic pressure.  3. The mitral valve is abnormal. Trivial mitral valve regurgitation.  4. The aortic valve has been repaired/replaced. Aortic valve regurgitation is trivial. There is a mechanical valve present in the aortic position. Echo findings are consistent with normal structure and function of the aortic valve prosthesis. Aortic valve mean gradient measures 14.0 mmHg. Normal occluder movement - no evidence of obstruction. DI is 0.44.  5. Aortic dilatation noted. There is mild dilatation of the ascending aorta, measuring 40 mm.  6. The inferior vena cava is normal in size with greater than 50% respiratory variability, suggesting right atrial pressure of 3 mmHg. Comparison(s): No prior Echocardiogram. FINDINGS  Left Ventricle: Left ventricular ejection fraction, by estimation, is 60 to 65%. The left ventricle has normal  function. The left ventricle has no regional wall motion abnormalities. The left ventricular internal cavity size was normal in size. There is  mild left ventricular hypertrophy. Left ventricular diastolic parameters are consistent with Grade I diastolic dysfunction (impaired relaxation). Indeterminate filling pressures. Right Ventricle: The right ventricular size is normal. No increase in right ventricular wall thickness. Right ventricular systolic function is normal. There is normal pulmonary artery systolic pressure. The tricuspid regurgitant velocity is 2.42 m/s, and  with an assumed right atrial pressure of 3 mmHg, the estimated right ventricular systolic pressure is 33.8 mmHg. Left Atrium: Left atrial size was normal in size. Right Atrium: Right atrial size was normal in size. Pericardium: There is no evidence of pericardial effusion. Mitral Valve: The mitral valve is abnormal. There is moderate calcification of the anterior mitral valve leaflet(s). Mild mitral annular calcification. Trivial mitral valve regurgitation. Tricuspid Valve: The tricuspid valve is grossly normal. Tricuspid valve regurgitation is trivial. Aortic Valve: The aortic valve has been repaired/replaced. Aortic valve regurgitation is trivial. Aortic valve mean gradient measures 14.0 mmHg. Aortic valve peak gradient measures 24.9 mmHg. Aortic valve area, by VTI measures 2.54 cm. There is a mechanical valve present in the aortic position. Echo findings are consistent with normal structure and function of the aortic valve prosthesis. Pulmonic Valve: The pulmonic valve was grossly normal. Pulmonic valve regurgitation is trivial. Aorta: Aortic dilatation noted. There is mild dilatation of the ascending aorta, measuring 40 mm. Venous: The inferior vena cava is normal in size with greater than 50% respiratory variability, suggesting right atrial pressure of 3 mmHg. IAS/Shunts: No atrial level shunt detected by color flow Doppler.  LEFT VENTRICLE  PLAX 2D LVIDd:         5.60 cm  Diastology LVIDs:         3.60 cm  LV e' medial:    8.16 cm/s LV PW:  1.20 cm  LV E/e' medial:  10.2 LV IVS:        1.10 cm  LV e' lateral:   9.46 cm/s LVOT diam:     2.70 cm  LV E/e' lateral: 8.8 LV SV:         110 LV SV Index:   58 LVOT Area:     5.73 cm  RIGHT VENTRICLE             IVC RV S prime:     12.50 cm/s  IVC diam: 1.50 cm TAPSE (M-mode): 2.1 cm LEFT ATRIUM             Index       RIGHT ATRIUM           Index LA diam:        4.20 cm 2.23 cm/m  RA Area:     17.60 cm LA Vol (A2C):   48.5 ml 25.72 ml/m RA Volume:   44.70 ml  23.71 ml/m LA Vol (A4C):   47.2 ml 25.04 ml/m LA Biplane Vol: 52.3 ml 27.74 ml/m  AORTIC VALVE AV Area (Vmax):    2.40 cm AV Area (Vmean):   2.35 cm AV Area (VTI):     2.54 cm AV Vmax:           249.50 cm/s AV Vmean:          172.000 cm/s AV VTI:            0.432 m AV Peak Grad:      24.9 mmHg AV Mean Grad:      14.0 mmHg LVOT Vmax:         104.50 cm/s LVOT Vmean:        70.700 cm/s LVOT VTI:          0.192 m LVOT/AV VTI ratio: 0.44  AORTA Ao Asc diam: 4.00 cm MITRAL VALVE                TRICUSPID VALVE MV Area (PHT): 4.06 cm     TR Peak grad:   23.4 mmHg MV Decel Time: 187 msec     TR Vmax:        242.00 cm/s MV E velocity: 83.20 cm/s MV A velocity: 127.00 cm/s  SHUNTS MV E/A ratio:  0.66         Systemic VTI:  0.19 m                             Systemic Diam: 2.70 cm Lyman Bishop MD Electronically signed by Lyman Bishop MD Signature Date/Time: 04/19/2021/12:36:23 PM    Final    CT Angio Chest/Abd/Pel for Dissection W and/or Wo Contrast  Result Date: 04/18/2021 CLINICAL DATA:  Chest and abdominal pain EXAM: CT ANGIOGRAPHY CHEST, ABDOMEN AND PELVIS TECHNIQUE: Non-contrast CT of the chest was initially obtained. Multidetector CT imaging through the chest, abdomen and pelvis was performed using the standard protocol during bolus administration of intravenous contrast. Multiplanar reconstructed images and MIPs were obtained and reviewed  to evaluate the vascular anatomy. CONTRAST:  144mL OMNIPAQUE IOHEXOL 350 MG/ML SOLN COMPARISON:  Chest x-ray from earlier in the same day, CT of the abdomen and pelvis from 03/27/2021. FINDINGS: CTA CHEST FINDINGS Cardiovascular: Initial precontrast images were obtained and reveal atherosclerotic calcification. No aneurysmal dilatation is noted. Prior aortic valve replacement is seen. Post-contrast images demonstrate atherosclerotic calcification of the thoracic aorta. No dissection is noted. The ascending  aorta is prominent to 4 cm. Coronary calcifications are noted. The pulmonary artery as visualized is within normal limits. Mediastinum/Nodes: Thoracic inlet is within normal limits. No sizable hilar or mediastinal adenopathy is noted. The esophagus as visualized is within normal limits with the exception of a small sliding-type hiatal hernia. Some calcified mediastinal nodes are seen consistent with prior granulomatous disease. Lungs/Pleura: Lungs are well aerated bilaterally. A few scattered calcified granulomas are seen. No sizable effusion is noted. No focal infiltrate is seen. Musculoskeletal: Mild degenerative changes of the thoracic spine are noted. Multiple old healed rib fractures are noted on the right. Chronic appearing T12 compression deformity is noted. Review of the MIP images confirms the above findings. CTA ABDOMEN AND PELVIS FINDINGS VASCULAR Aorta: Atherosclerotic calcifications are noted without aneurysmal dilatation or dissection. Celiac: Mild atherosclerotic changes are noted without focal significant stenosis. SMA: Mild atherosclerotic calcifications are noted without focal significant stenosis. Renals: Both renal arteries are patent without evidence of aneurysm, dissection, vasculitis, fibromuscular dysplasia or significant stenosis. IMA: Patent without evidence of aneurysm, dissection, vasculitis or significant stenosis. Iliacs: Atherosclerotic calcifications are noted without aneurysmal  dilatation or dissection. Veins: No specific venous abnormality is noted. Review of the MIP images confirms the above findings. NON-VASCULAR Hepatobiliary: No focal liver abnormality is seen. No gallstones, gallbladder wall thickening, or biliary dilatation. Pancreas: Unremarkable. No pancreatic ductal dilatation or surrounding inflammatory changes. Spleen: Spleen demonstrates scattered calcified granulomas. No focal abnormality is noted. Adrenals/Urinary Tract: Adrenal glands are within normal limits. Kidneys demonstrate a normal enhancement pattern bilaterally. No renal calculi or obstructive changes are seen. The bladder is well distended. Stomach/Bowel: Diverticular change of the colon is noted. Very mild inflammatory changes are seen within the descending colon as well as the junction of the descending and ascending colons. No evidence of perforation or abscess formation is seen. Appendix is not well visualized although no inflammatory changes are seen to suggest appendicitis. Small bowel and stomach are within normal limits. Lymphatic: No significant lymphadenopathy is identified. Reproductive: Prostate is unremarkable. Other: No abdominal wall hernia or abnormality. No abdominopelvic ascites. Musculoskeletal: Chronic L1 compression deformity is noted of lesser degree than that seen at T12. This is also stable from the prior exam. Review of the MIP images confirms the above findings. IMPRESSION: Changes of mild diverticulitis in the descending and sigmoid colon. No perforation or abscess formation is seen. 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 No evidence of dissection is noted within the thoracic or abdominal aorta. Recommend annual imaging followup by CTA or MRA. This recommendation follows 2010 ACCF/AHA/AATS/ACR/ASA/SCA/SCAI/SIR/STS/SVM Guidelines for the Diagnosis and Management of Patients with Thoracic Aortic Disease. Circulation. 2010; 121: V785-Y850. Aortic aneurysm NOS (ICD10-I71.9) Hiatal  hernia. Changes of prior granulomatous disease. Chronic T12 and L1 compression deformities. Aortic Atherosclerosis (ICD10-I70.0). Electronically Signed   By: Inez Catalina M.D.   On: 04/18/2021 18:35    Procedures Procedures   Medications Ordered in ED Medications  metroNIDAZOLE (FLAGYL) IVPB 500 mg (500 mg Intravenous New Bag/Given 04/19/21 1214)  pravastatin (PRAVACHOL) tablet 40 mg (40 mg Oral Given 04/19/21 1731)  LORazepam (ATIVAN) tablet 0.5 mg (0.5 mg Oral Given 04/19/21 1731)  sertraline (ZOLOFT) tablet 100 mg (100 mg Oral Not Given 04/19/21 1000)  traZODone (DESYREL) tablet 150 mg (150 mg Oral Given 04/18/21 2253)  multivitamin with minerals tablet 1 tablet (1 tablet Oral Given 04/19/21 1023)  thiamine tablet 100 mg (100 mg Oral Given 04/19/21 1023)  albuterol (VENTOLIN HFA) 108 (90 Base) MCG/ACT inhaler 1  puff (has no administration in time range)  loratadine (CLARITIN) tablet 10 mg (10 mg Oral Given 04/19/21 1023)  acetaminophen (TYLENOL) tablet 650 mg (650 mg Oral Given 04/19/21 1044)    Or  acetaminophen (TYLENOL) suppository 650 mg ( Rectal See Alternative 04/19/21 1044)  polyethylene glycol (MIRALAX / GLYCOLAX) packet 17 g (17 g Oral Given 04/19/21 1044)  ondansetron (ZOFRAN) tablet 4 mg (has no administration in time range)    Or  ondansetron (ZOFRAN) injection 4 mg (has no administration in time range)  0.9 %  sodium chloride infusion ( Intravenous New Bag/Given 04/18/21 2257)  cefTRIAXone (ROCEPHIN) 1 g in sodium chloride 0.9 % 100 mL IVPB (0 g Intravenous Duplicate 9/67/89 3810)  Warfarin - Pharmacist Dosing Inpatient (has no administration in time range)  0.9 %  sodium chloride infusion (Manually program via Guardrails IV Fluids) (has no administration in time range)  pantoprazole (PROTONIX) EC tablet 40 mg (has no administration in time range)  lactated ringers bolus 1,000 mL (0 mLs Intravenous Stopped 04/18/21 1701)  iohexol (OMNIPAQUE) 350 MG/ML injection 100 mL (100 mLs  Intravenous Contrast Given 04/18/21 1750)  fentaNYL (SUBLIMAZE) injection 50 mcg (50 mcg Intravenous Given 04/18/21 1916)  metoCLOPramide (REGLAN) injection 10 mg (10 mg Intravenous Given 04/18/21 2255)    And  diphenhydrAMINE (BENADRYL) injection 25 mg (25 mg Intravenous Given 04/18/21 2253)  warfarin (COUMADIN) tablet 5 mg (5 mg Oral Given 04/18/21 2254)  warfarin (COUMADIN) tablet 5 mg (5 mg Oral Given 04/19/21 1731)    ED Course  I have reviewed the triage vital signs and the nursing notes.  Pertinent labs & imaging results that were available during my care of the patient were reviewed by me and considered in my medical decision making (see chart for details).  Clinical Course as of 04/19/21 2002  Sat Apr 18, 2021  1551 75 yo male on coumadin for AVR presenting with syncope.  He reports acute onset of headache proceeding this.  His fiance helped him to the ground, no head trauma.  EMS reports hypotension, SBP 70's on scene.  He vomited x 1 .  Here BP on my exam is 101/60, HR normal.  Pt reporting significant discomfort in left shoulder blade and left neck, non-positional.  Denying headache to me.  CTH obtained on arrival.  We'll likely need a stat CT dissection study given his hypotension and back/neck pain.  Labs are pending.  IV fluids running.  His fiance is present at bedside. 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  Will consult neurology.   [MT]  1751 BP remains stable [MT]  0258 Neurology recommending MR brain to r/o lacunar infarct with CT read though would be atypical presentation for this. Will r/o dissection first before proceeding with any MR imaging. [ZB]  5277 CT Angio Chest/Abd/Pel for Dissection W and/or Wo Contrast [MT]  1909 No acute dissection on CT imaging.  Vitals and BP have remained stable after fluids.  At this time we will anticipate medical admission for hypotension/chest pain/syncope evaluation.  I discussed the CT imaging findings with neurologist Dr Cheral Marker who  felt this was likely a chronic finding, particularly in this clinical context of hypotension and syncope, but agreed with MR imaging of the brain to better evaluate the lesion.  We will order the MRI and admit to the hospitalist. [MT]  1910 Although he has a hx of hematemesis and low hgb on prior hospital visit last month, I have a lower suspicion for sepsis or  acute GI bleed at this time.  Hgb is 8.3 closer to baseline levels.  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 He was walking around downtown when the syncopal episode occurred.  His fiance was able to help him to the ground.  He did not fall to the ground or strike his head. He was hypotensive with EMS and was given 500 cc of crystalloid with improvement.  In the emergency department here he was hypotensive with a MAP of around 60 though not tachycardic. -Ordered for crystalloid bolus. He had a nonfocal neurologic exam.  Primary concern at this time is intracranial hemorrhage.  I am also considering vertebral and/or cerebral artery aneurysm, dissection. CT head ordered.  If negative, will also need CTA head and neck.  Did consider ACS given his history.  EKG as above without evidence of ischemia.  Ordered for cardiac biomarkers.  See ED clinical course above for further medical decision-making.  No acute cardiovascular or neurosurgical pathology identified on work-up in the emergency department. Admitted to medicine for workup of high risk syncope.   Final Clinical Impression(s) / ED Diagnoses Final diagnoses:  Syncope, unspecified syncope type   Current use of long term anticoagulation  Hypotension, unspecified hypotension type    Rx / DC Orders ED Discharge Orders     None        Pearson Grippe, DO 04/19/21 2004    Wyvonnia Dusky, MD 04/20/21 1005

## 2021-04-18 NOTE — Progress Notes (Signed)
ANTICOAGULATION CONSULT NOTE - Initial Consult  Pharmacy Consult for warfarin Indication: Mech AVR  Allergies  Allergen Reactions   Aspirin     Other reaction(s): Other (See Comments) Can't take due to taking blood thinners   Morphine And Related Other (See Comments)    Other reaction(s): Other (See Comments) Hallucinations     Promethazine Other (See Comments)    Other reaction(s): Other (See Comments) Hallucinations     Patient Measurements:    Vital Signs: Temp: 97.8 F (36.6 C) (06/18 1556) Temp Source: Oral (06/18 1556) BP: 115/71 (06/18 1915) Pulse Rate: 76 (06/18 1915)  Labs: Recent Labs    04/18/21 1607 04/18/21 1834  HGB 8.3*  --   HCT 27.8*  --   PLT 345  --   LABPROT 20.6*  --   INR 1.8*  --   CREATININE 1.60*  --   TROPONINIHS 10 10    Estimated Creatinine Clearance: 38.6 mL/min (A) (by C-G formula based on SCr of 1.6 mg/dL (H)).   Medical History: 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[facility]) 03/28/2021   Closed compression fracture of body of L1 vertebra ([facility]) 0/12/7046   Complication of anesthesia    PONV   Compression fracture of T12 vertebra ([facility]) 03/28/2021   Depression    History of bladder cancer    History of pneumonia 1959   History of prostate cancer    History of scarlet fever 1956   History of stroke 2009   Hx of long term use of blood thinners    Hypertension    Long term current use of anticoagulant 03/22/2011   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   Assessment: 40 YOM presenting with N/V and HA, hx of mech AVR on warfarin PTA.  INR on admission subtherapeutic at 1.8, last dose 6/17 (2.5mg )  PTA dosing: 2.5mg  alternating 3mg  every other day (6/8 clinic notes say he is to take 3mg  daily instead d/t INR of 1.6)  Goal of Therapy:  INR 2.5-3.5 Monitor platelets by anticoagulation protocol: Yes   Plan:  Warfarin 5 mg PO x 1 Daily INR, s/s bleeding  Bertis Ruddy, PharmD Clinical Pharmacist ED  Pharmacist Phone # [phone] 04/18/2021 8:51 PM

## 2021-04-18 NOTE — ED Notes (Signed)
Patient transported to CT 

## 2021-04-18 NOTE — H&P (Signed)
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  History has been obtained from combination of the patient and his significant other at the bedside.  Patient states that when he woke up this morning, he was not feeling his normal self.  The sense of generalized malaise and fatigue.  He is and his significant other went to the [employer] to attend a lecture.  As they were reviewing exhibits prior to the lecture, patient felt extremely weak and began to develop an occipital headache as well as abdominal pain and nausea..  While sitting down, in the theater patient states that he was so exhausted that he fell asleep for a while.  Upon waking, he attempted to walk in the halls but his symptoms of generalized malaise, fatigue, abdominal pain nausea and severe headache continued to worsen.  Upon further questioning patient denies recent fevers, dysuria, recent diarrhea, sick contacts.  Patient does admit to eating eggs this morning and was the only to partake of them.    When asked to describe his abdominal pain he describes it as located in the periumbilical and lower abdominal region, sharp in quality, nonradiating, worse with movement and improved with rest.  Pain is severe in intensity.  As he proceeded to walk out of [place] [facility] and on the sidewalk  patient felt that he was about to pass out proceeded to sit on the sidewalk and began to uncontrollably vomit.  Patient exhibited multiple episodes of nonbilious nonbloody vomiting.  The vomitus appeared to be eggs that he had eaten this morning.  The patient fianc reports that the patient then experienced a a brief episode of loss of consciousness.  At this point, EMS was contacted and promptly came to evaluate the patient.  Initial systolic blood pressure was found to be 72.  Patient was administered 500 cc of normal saline and brought into [facility] emergency department for evaluation.  Upon evaluation in the emergency department initial work-up reveals a leukocytosis of 14.8, worsening anemia with hemoglobin of 8.3, down from 9.8 on 6/14.  ET angiogram of the chest abdomen pelvis was performed to evaluate for any evidence of dissection.  This was negative for dissection but did reveal evidence of mild diverticulitis of the descending and sigmoid colon.  CT imaging of the head was performed which revealed no evidence of acute intracranial hemorrhage but did reveal hypoattenuation of the lobes as well as left basal ganglia and thalamus.  MRI brain was ordered by the ER provider which is pending.  Patient was administered more intravenous fluids and the hospitalist group was then called to assess the patient for admission to the hospital.  Review of Systems:   Review of Systems  Constitutional:  Positive for malaise/fatigue.  Gastrointestinal:  Positive for abdominal pain, constipation, nausea and vomiting.  Musculoskeletal:  Positive for back pain, myalgias and neck pain.  Neurological:  Positive  for loss of consciousness and weakness.   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[place]) 03/28/2021   Closed compression fracture of body of L1 vertebra ([place]) 8/41/3244   Complication of anesthesia    PONV   Compression fracture of T12 vertebra ([facility]) 03/28/2021    Depression    History of bladder cancer    History of pneumonia 1959   History of prostate cancer    History of scarlet fever 1956   History of stroke 2009   Hx of long term use of blood thinners    Hypertension    Long term current use of anticoagulant 03/22/2011   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    Past Surgical History:  Procedure Laterality Date   AORTIC VALVE REPLACEMENT     mechanical   BIOPSY  03/28/2021   Procedure: BIOPSY;  Surgeon: Ronald Lobo, MD;  Location: [facility] ENDOSCOPY;  Service: Endoscopy;;   BLADDER SURGERY  1983   Bladder Cancer Surgery    ESOPHAGOGASTRODUODENOSCOPY (EGD) WITH PROPOFOL N/A 03/28/2021   Procedure: ESOPHAGOGASTRODUODENOSCOPY (EGD) WITH PROPOFOL;  Surgeon: Ronald Lobo, MD;  Location: [facility] ENDOSCOPY;  Service: Endoscopy;  Laterality: N/A;   KNEE SURGERY Right 1980   KNEE SURGERY Left 1982   OTHER SURGICAL HISTORY  1992   stomach muscles removed due to Coumadin caused bleeding.   TONSILLECTOMY  1965     reports that he has never smoked. His smokeless tobacco use includes snuff. He reports current alcohol use. He reports that he does not use drugs.  Allergies  Allergen Reactions   Aspirin     Other reaction(s): Other (See Comments) Can't take due to taking blood thinners   Morphine And Related Other (See Comments)    Other reaction(s): Other (See Comments) Hallucinations     Promethazine Other (See Comments)    Other reaction(s): Other (See Comments) Hallucinations     Family History  Problem Relation Age of Onset   Heart disease Neg Hx      Prior to Admission medications   Medication Sig Start Date End Date Taking? Authorizing Provider  acetaminophen (TYLENOL) 500 MG tablet Take 500 mg by mouth daily as needed for moderate pain.    [provider]  albuterol (VENTOLIN HFA) 108 (90 Base) MCG/ACT inhaler Inhale 1 puff into the lungs every 6 (six) hours as needed for wheezing or shortness of breath.    [provider]   amLODipine (NORVASC) 5 MG tablet Take 1 tablet (5 mg total) by mouth daily. 03/10/21   [name], NP  busPIRone (BUSPAR) 5 MG tablet Take 1 tablet (5 mg total) by mouth 2 (two) times daily. Patient not taking: No sig reported 03/10/21   [name], NP  cetirizine (ZYRTEC) 10 MG tablet Take 10 mg by mouth daily.    [provider]  folic acid (FOLVITE) 1 MG tablet Take 1 tablet (1 mg total) by mouth daily. 03/30/21   Samuella Cota, MD  furosemide (LASIX) 20 MG tablet Take 1 tablet (20 mg total) by mouth daily. 03/10/21   [name], NP  Iron, Ferrous Sulfate, 325 (65 Fe) MG TABS Take 325 mg by mouth in the morning and at bedtime. 03/16/21   [name], NP  LORazepam (ATIVAN) 0.5 MG tablet Take 1 tablet (0.5 mg total) by mouth as directed. One tablet in morning, and two tablets at night. Patient taking differently: Take 0.5 mg by mouth as directed. One tablet  by mouth 3 times a day (tapering) 03/10/21   [name], NP  lovastatin (MEVACOR) 40 MG tablet Take 40 mg by mouth at bedtime.    [provider]  Multiple Vitamin (MULTIVITAMIN WITH MINERALS) TABS tablet Take 1 tablet by mouth daily.    [provider]  pantoprazole (PROTONIX) 40 MG tablet Take 1 tablet (40 mg total) by mouth 2 (two) times daily. 03/10/21   [name], NP  ramipril (ALTACE) 10 MG capsule Take 1 capsule (10 mg total) by mouth 2 (two) times daily. Patient taking differently: Take 20 mg by mouth daily. 03/10/21   [name], NP  sertraline (ZOLOFT) 100 MG tablet Take 1 tablet (100 mg total) by mouth 2 (two) times daily. 03/10/21   [name], NP  thiamine 100 MG tablet Take 100 mg by mouth daily.    [provider]  traZODone (DESYREL) 150 MG tablet Take 150 mg by mouth at bedtime.    [provider]  warfarin (COUMADIN) 1 MG tablet Take 0.5 tablets (0.5 mg total) by mouth daily. 03/10/21   [name], NP  warfarin  (COUMADIN) 2 MG tablet Take 1 tablet (2 mg total) by mouth daily. Along with 1/2 of 1 mg tablet for a total of 2.5 mg daily 03/10/21   [name], NP    Physical Exam: Vitals:   04/18/21 1730 04/18/21 1745 04/18/21 1845 04/18/21 1915  BP: 102/69 107/63 109/65 115/71  Pulse: 74 74 76 76  Resp: 15 15 20 15   Temp:      TempSrc:      SpO2: 100% 100% 100% 100%    Constitutional: Awake alert and oriented x3, in distress due to headache. Skin: no rashes, no lesions, somewhat poor skin turgor noted. Eyes: Pupils are equally reactive to light.  No evidence of scleral icterus or conjunctival pallor.  ENMT: Somewhat dry mucous membranes noted.  Posterior pharynx clear of any exudate or lesions.   Neck: No evidence of nuchal rigidity noted.  no masses, no thyromegaly.  No evidence of jugular venous distension.  Kerning and Burzynski signs are negative. Respiratory: clear to auscultation bilaterally, no wheezing, no crackles. Normal respiratory effort. No accessory muscle use.  Cardiovascular: Regular rate and rhythm, no murmurs / rubs / gallops. No extremity edema. 2+ pedal pulses. No carotid bruits.  Chest:   Nontender without crepitus or deformity.   Back:   Nontender without crepitus or deformity. Abdomen: Abdomen is protuberant but soft.  Tenderness, worst in the periumbilical region and bilateral lower quadrants.  Abdomen is soft and nontender.  No evidence of intra-abdominal masses.  Positive bowel sounds noted in all quadrants.   Musculoskeletal: No joint deformity upper and lower extremities. Good ROM, no contractures. Normal muscle tone.  Neurologic: CN 2-12 grossly intact. Sensation intact.  Patient moving all 4 extremities spontaneously.  Patient is following all commands.  Patient is responsive to verbal stimuli.   Psychiatric: Patient exhibits normal mood with appropriate affect.  Patient seems to possess insight as to their current situation.     Labs on Admission: I have  personally reviewed following labs and imaging studies -   CBC: Recent Labs  Lab 04/14/21 0922 04/18/21 1607  WBC 6.7 14.8*  NEUTROABS  --  12.9*  HGB 9.8* 8.3*  HCT 32.2* 27.8*  MCV 75.4* 78.5*  PLT 446* 283   Basic Metabolic Panel: Recent Labs  Lab 04/18/21 1607  NA 133*  K 4.2  CL 106  CO2 22  GLUCOSE 116*  BUN 37*  CREATININE 1.60*  CALCIUM 8.5*   GFR: Estimated Creatinine Clearance: 38.6 mL/min (A) (by C-G formula based on SCr of 1.6 mg/dL (H)). Liver Function Tests: Recent Labs  Lab 04/18/21 1607  AST 15  ALT 11  ALKPHOS 77  BILITOT 0.5  PROT 5.5*  ALBUMIN 2.9*   No results for input(s): LIPASE, AMYLASE in the last 168 hours. No results for input(s): AMMONIA in the last 168 hours. Coagulation Profile: Recent Labs  Lab 04/14/21 0920 04/18/21 1607  INR 2.2 1.8*   Cardiac Enzymes: No results for input(s): CKTOTAL, CKMB, CKMBINDEX, TROPONINI in the last 168 hours. BNP (last 3 results) No results for input(s): PROBNP in the last 8760 hours. HbA1C: No results for input(s): HGBA1C in the last 72 hours. CBG: Recent Labs  Lab 04/18/21 1647  GLUCAP 84   Lipid Profile: No results for input(s): CHOL, HDL, LDLCALC, TRIG, CHOLHDL, LDLDIRECT in the last 72 hours. Thyroid Function Tests: No results for input(s): TSH, T4TOTAL, FREET4, T3FREE, THYROIDAB in the last 72 hours. Anemia Panel: No results for input(s): VITAMINB12, FOLATE, FERRITIN, TIBC, IRON, RETICCTPCT in the last 72 hours. Urine analysis:    Component Value Date/Time   COLORURINE STRAW (A) 04/18/2021 1924   APPEARANCEUR CLEAR 04/18/2021 1924   LABSPEC 1.018 04/18/2021 1924   PHURINE 5.0 04/18/2021 1924   GLUCOSEU NEGATIVE 04/18/2021 1924   HGBUR NEGATIVE 04/18/2021 1924   BILIRUBINUR NEGATIVE 04/18/2021 1924   KETONESUR NEGATIVE 04/18/2021 1924   PROTEINUR NEGATIVE 04/18/2021 1924   NITRITE NEGATIVE 04/18/2021 1924   LEUKOCYTESUR NEGATIVE 04/18/2021 1924    Radiological Exams on  Admission - Personally Reviewed: CT Head Wo Contrast  Result Date: 04/18/2021 CLINICAL DATA:  Headache. EXAM: CT HEAD WITHOUT CONTRAST TECHNIQUE: Contiguous axial images were obtained from the base of the skull through the vertex without intravenous contrast. COMPARISON:  None. FINDINGS: Brain: No evidence of acute hemorrhage, hydrocephalus, extra-axial collection or mass lesion/mass effect. Small areas of hypoattenuation in the deep white matter of the frontal lobes, left basal ganglion left thalamus may represent age-indeterminate lacunar infarcts or asymmetric white matter microangiopathy. Vascular: Calcific atherosclerotic disease of the intra cavernous carotid arteries. Skull: Normal. Negative for fracture or focal lesion. Sinuses/Orbits: No acute finding. Other: None. IMPRESSION: Small areas of hypoattenuation in the deep white matter of the frontal lobes, left basal ganglia and left thalamus may represent age-indeterminate lacunar infarcts or asymmetric white matter microangiopathy. No evidence of intracranial hemorrhage. Electronically Signed   By: Fidela Salisbury M.D.   On: 04/18/2021 16:25   DG Chest Portable 1 View  Result Date: 04/18/2021 CLINICAL DATA:  Syncope EXAM: PORTABLE CHEST 1 VIEW COMPARISON:  None. FINDINGS: Prior median sternotomy and cardiac valve replacement. Heart size is upper limits of normal. No focal airspace consolidation, pleural effusion, or pneumothorax. Advanced degenerative changes of the right shoulder. IMPRESSION: No active disease. Electronically Signed   By: Davina Poke D.O.   On: 04/18/2021 16:32   CT Angio Chest/Abd/Pel for Dissection W and/or Wo Contrast  Result Date: 04/18/2021 CLINICAL DATA:  Chest and abdominal pain EXAM: CT ANGIOGRAPHY CHEST, ABDOMEN AND PELVIS TECHNIQUE: Non-contrast CT of the chest was initially obtained. Multidetector CT imaging through the chest, abdomen and pelvis was performed using the standard protocol during bolus  administration of intravenous contrast. Multiplanar reconstructed images and MIPs were obtained and reviewed to evaluate the vascular anatomy. CONTRAST:  139mL OMNIPAQUE IOHEXOL 350 MG/ML SOLN COMPARISON:  Chest  x-ray from earlier in the same day, CT of the abdomen and pelvis from 03/27/2021. FINDINGS: CTA CHEST FINDINGS Cardiovascular: Initial precontrast images were obtained and reveal atherosclerotic calcification. No aneurysmal dilatation is noted. Prior aortic valve replacement is seen. Post-contrast images demonstrate atherosclerotic calcification of the thoracic aorta. No dissection is noted. The ascending aorta is prominent to 4 cm. Coronary calcifications are noted. The pulmonary artery as visualized is within normal limits. Mediastinum/Nodes: Thoracic inlet is within normal limits. No sizable hilar or mediastinal adenopathy is noted. The esophagus as visualized is within normal limits with the exception of a small sliding-type hiatal hernia. Some calcified mediastinal nodes are seen consistent with prior granulomatous disease. Lungs/Pleura: Lungs are well aerated bilaterally. A few scattered calcified granulomas are seen. No sizable effusion is noted. No focal infiltrate is seen. Musculoskeletal: Mild degenerative changes of the thoracic spine are noted. Multiple old healed rib fractures are noted on the right. Chronic appearing T12 compression deformity is noted. Review of the MIP images confirms the above findings. CTA ABDOMEN AND PELVIS FINDINGS VASCULAR Aorta: Atherosclerotic calcifications are noted without aneurysmal dilatation or dissection. Celiac: Mild atherosclerotic changes are noted without focal significant stenosis. SMA: Mild atherosclerotic calcifications are noted without focal significant stenosis. Renals: Both renal arteries are patent without evidence of aneurysm, dissection, vasculitis, fibromuscular dysplasia or significant stenosis. IMA: Patent without evidence of aneurysm,  dissection, vasculitis or significant stenosis. Iliacs: Atherosclerotic calcifications are noted without aneurysmal dilatation or dissection. Veins: No specific venous abnormality is noted. Review of the MIP images confirms the above findings. NON-VASCULAR Hepatobiliary: No focal liver abnormality is seen. No gallstones, gallbladder wall thickening, or biliary dilatation. Pancreas: Unremarkable. No pancreatic ductal dilatation or surrounding inflammatory changes. Spleen: Spleen demonstrates scattered calcified granulomas. No focal abnormality is noted. Adrenals/Urinary Tract: Adrenal glands are within normal limits. Kidneys demonstrate a normal enhancement pattern bilaterally. No renal calculi or obstructive changes are seen. The bladder is well distended. Stomach/Bowel: Diverticular change of the colon is noted. Very mild inflammatory changes are seen within the descending colon as well as the junction of the descending and ascending colons. No evidence of perforation or abscess formation is seen. Appendix is not well visualized although no inflammatory changes are seen to suggest appendicitis. Small bowel and stomach are within normal limits. Lymphatic: No significant lymphadenopathy is identified. Reproductive: Prostate is unremarkable. Other: No abdominal wall hernia or abnormality. No abdominopelvic ascites. Musculoskeletal: Chronic L1 compression deformity is noted of lesser degree than that seen at T12. This is also stable from the prior exam. Review of the MIP images confirms the above findings. IMPRESSION: Changes of mild diverticulitis in the descending and sigmoid colon. No perforation or abscess formation is seen. 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 No evidence of dissection is noted within the thoracic or abdominal aorta. Recommend annual imaging followup by CTA or MRA. This recommendation follows 2010 ACCF/AHA/AATS/ACR/ASA/SCA/SCAI/SIR/STS/SVM Guidelines for the Diagnosis and Management of  Patients with Thoracic Aortic Disease. Circulation. 2010; 121: [id number]. Aortic aneurysm NOS (ICD10-I71.9) Hiatal hernia. Changes of prior granulomatous disease. Chronic T12 and L1 compression deformities. Aortic Atherosclerosis (ICD10-I70.0). Electronically Signed   By: Inez Catalina M.D.   On: 04/18/2021 18:35    EKG: Personally reviewed.  Rhythm is normal sinus rhythm with heart rate of 86 bpm.  QTC of 476.  Evidence of incomplete left bundle branch block.  No dynamic ST segment changes appreciated.  Assessment/Plan Principal Problem:   Diverticulitis of sigmoid colon  Patient is presenting with a  large constellation of symptoms including abdominal pain nausea, vomiting, headaches, weakness CT imaging concerning for mild diverticulitis of the descending and sigmoid colon Initiated patient on intravenous ceftriaxone and Flagyl Hydrate patient with intravenous isotonic fluids Clear liquid diet  Active Problems:   Syncope, vasovagal  Patient experienced an episode of witnessed syncope after experiencing multiple episodes of vomiting EMS reports systolic blood pressure initially 72 systolic on initial assessment Syncope was likely vasovagal Monitor patient on telemetry Hydrating patient with intravenous fluids Orthostatic vital signs Echocardiogram in the morning Holding home regimen of diuretic therapy  Anemia  Concern for possible blood loss anemia during recent hospitalization in late May Endoscopic work-up did not reveal a definitive source of infection Arrangements were made for the patient to have continued GI evaluation as an outpatient. Hemoglobin has had a sudden drop to 8.3 from 9.8 fourdays ago Protonix 40 g IV twice daily Serial CBCs If hemoglobin continues to downtrend will pursue gastroenterology consultation    Nausea and vomiting  Patient exhibited several bouts of vomiting prior to his presentation Possibly elicited by infectious process/diverticulitis Patient  was the only one that ate eggs this morning, a gastroenteritis is possible although it is unlike the patient is suffering from 2 things at 1 time. Supportive care, as needed antiemetics.    Mixed hyperlipidemia  Continue home regimen of statin therapy    Hx of mechanical aortic valve replacement  Aortic valve replacement with a mechanical valve in 1985 for aortic valve regurgitation. Resume home regimen of Coumadin, adjustments to be made a pharmacy consultation for target INR between 2.5-3.5    [facility] grade B esophagitis Recent diagnosis via EGD 03/2021 Further details above, twice daily PPI    Migraine headache with aura  Patient states that his headache he is currently experiencing is similar to migraines he has had in the past with a "black-and-white" visual aura preceding the headache We will attempt to treat with a combination of Reglan and Benadryl, reassess for improvement Neck is supple with kerning and Brudzinski signs negative and therefore meningitis is unlikely.    Alcohol abuse  Patient has been abstinent for several weeks Patient exhibits no evidence of withdrawal Will initiate CIWA protocol if patient begins to exhibit evidence of withdrawal.    Generalized anxiety disorder  Continue home regimen of psychotropic therapy.    Essential hypertension  Continuing home regimen amlodipine Temporarily holding diuretic therapy, ramipril for now.   Code Status:  DNR Family Communication: Ellene Route is at bedside has been updated on plan of care.  Status is: Observation  The patient remains OBS appropriate and will d/c before 2 midnights.  Dispo: The patient is from: Home              Anticipated d/c is to: Home              Patient currently is not medically stable to d/c.   Difficult to place patient No        Vernelle Emerald MD [facility] Pager [phone]  If 7PM-7AM, please contact night-coverage [email or web address] Use universal [facility] password for that web site. If you do not have the password, please call the hospital operator.  04/18/2021, 8:43 PM

## 2021-04-18 NOTE — ED Triage Notes (Addendum)
Pt BIB [facility] after syncope while outside. Pt family reports that pt vomited prior to syncope and reported significant neck pain prior to vomiting. EMS reports pt initially responsive to verbal stimuli, initial pressure 72 palpated, increased to 100/54 after 500 bolus of NS. Pt A&Ox4 at this time.

## 2021-04-18 NOTE — ED Notes (Signed)
Called to give report, floor states the pt is not yet approved and to call back in 5-10 minutes.

## 2021-04-19 ENCOUNTER — Observation Stay (HOSPITAL_COMMUNITY): Payer: Medicare Other

## 2021-04-19 ENCOUNTER — Other Ambulatory Visit: Payer: Self-pay

## 2021-04-19 DIAGNOSIS — K5733 Diverticulitis of large intestine without perforation or abscess with bleeding: Secondary | ICD-10-CM | POA: Diagnosis present

## 2021-04-19 DIAGNOSIS — N1831 Chronic kidney disease, stage 3a: Secondary | ICD-10-CM | POA: Diagnosis present

## 2021-04-19 DIAGNOSIS — N179 Acute kidney failure, unspecified: Secondary | ICD-10-CM | POA: Diagnosis present

## 2021-04-19 DIAGNOSIS — K922 Gastrointestinal hemorrhage, unspecified: Secondary | ICD-10-CM | POA: Diagnosis present

## 2021-04-19 DIAGNOSIS — R55 Syncope and collapse: Secondary | ICD-10-CM | POA: Diagnosis present

## 2021-04-19 DIAGNOSIS — F1010 Alcohol abuse, uncomplicated: Secondary | ICD-10-CM | POA: Diagnosis not present

## 2021-04-19 DIAGNOSIS — I129 Hypertensive chronic kidney disease with stage 1 through stage 4 chronic kidney disease, or unspecified chronic kidney disease: Secondary | ICD-10-CM | POA: Diagnosis present

## 2021-04-19 DIAGNOSIS — F411 Generalized anxiety disorder: Secondary | ICD-10-CM | POA: Diagnosis present

## 2021-04-19 DIAGNOSIS — I10 Essential (primary) hypertension: Secondary | ICD-10-CM | POA: Diagnosis not present

## 2021-04-19 DIAGNOSIS — Z952 Presence of prosthetic heart valve: Secondary | ICD-10-CM | POA: Diagnosis not present

## 2021-04-19 DIAGNOSIS — E871 Hypo-osmolality and hyponatremia: Secondary | ICD-10-CM | POA: Diagnosis not present

## 2021-04-19 DIAGNOSIS — K5793 Diverticulitis of intestine, part unspecified, without perforation or abscess with bleeding: Secondary | ICD-10-CM

## 2021-04-19 DIAGNOSIS — D62 Acute posthemorrhagic anemia: Secondary | ICD-10-CM | POA: Diagnosis present

## 2021-04-19 DIAGNOSIS — Z8673 Personal history of transient ischemic attack (TIA), and cerebral infarction without residual deficits: Secondary | ICD-10-CM | POA: Diagnosis not present

## 2021-04-19 DIAGNOSIS — I959 Hypotension, unspecified: Secondary | ICD-10-CM | POA: Diagnosis present

## 2021-04-19 DIAGNOSIS — I440 Atrioventricular block, first degree: Secondary | ICD-10-CM | POA: Diagnosis present

## 2021-04-19 DIAGNOSIS — K5732 Diverticulitis of large intestine without perforation or abscess without bleeding: Secondary | ICD-10-CM | POA: Diagnosis not present

## 2021-04-19 DIAGNOSIS — F17290 Nicotine dependence, other tobacco product, uncomplicated: Secondary | ICD-10-CM | POA: Diagnosis present

## 2021-04-19 DIAGNOSIS — Z8551 Personal history of malignant neoplasm of bladder: Secondary | ICD-10-CM | POA: Diagnosis not present

## 2021-04-19 DIAGNOSIS — G43109 Migraine with aura, not intractable, without status migrainosus: Secondary | ICD-10-CM | POA: Diagnosis present

## 2021-04-19 DIAGNOSIS — Z66 Do not resuscitate: Secondary | ICD-10-CM | POA: Diagnosis present

## 2021-04-19 DIAGNOSIS — E611 Iron deficiency: Secondary | ICD-10-CM | POA: Diagnosis present

## 2021-04-19 DIAGNOSIS — Z79899 Other long term (current) drug therapy: Secondary | ICD-10-CM | POA: Diagnosis not present

## 2021-04-19 DIAGNOSIS — Z20822 Contact with and (suspected) exposure to covid-19: Secondary | ICD-10-CM | POA: Diagnosis present

## 2021-04-19 DIAGNOSIS — E782 Mixed hyperlipidemia: Secondary | ICD-10-CM | POA: Diagnosis present

## 2021-04-19 DIAGNOSIS — Z7901 Long term (current) use of anticoagulants: Secondary | ICD-10-CM | POA: Diagnosis not present

## 2021-04-19 DIAGNOSIS — K2100 Gastro-esophageal reflux disease with esophagitis, without bleeding: Secondary | ICD-10-CM | POA: Diagnosis present

## 2021-04-19 DIAGNOSIS — Z8546 Personal history of malignant neoplasm of prostate: Secondary | ICD-10-CM | POA: Diagnosis not present

## 2021-04-19 LAB — ECHOCARDIOGRAM COMPLETE
AR max vel: 2.4 cm2
AV Area VTI: 2.54 cm2
AV Area mean vel: 2.35 cm2
AV Mean grad: 14 mmHg
AV Peak grad: 24.9 mmHg
Ao pk vel: 2.5 m/s
Area-P 1/2: 4.06 cm2
S' Lateral: 3.6 cm

## 2021-04-19 LAB — CBC
HCT: 23 % — ABNORMAL LOW (ref 39.0–52.0)
Hemoglobin: 7.1 g/dL — ABNORMAL LOW (ref 13.0–17.0)
MCH: 23.7 pg — ABNORMAL LOW (ref 26.0–34.0)
MCHC: 30.9 g/dL (ref 30.0–36.0)
MCV: 76.9 fL — ABNORMAL LOW (ref 80.0–100.0)
Platelets: 294 10*3/uL (ref 150–400)
RBC: 2.99 MIL/uL — ABNORMAL LOW (ref 4.22–5.81)
RDW: 20 % — ABNORMAL HIGH (ref 11.5–15.5)
WBC: 8.2 10*3/uL (ref 4.0–10.5)
nRBC: 0 % (ref 0.0–0.2)

## 2021-04-19 LAB — PROTIME-INR
INR: 1.9 — ABNORMAL HIGH (ref 0.8–1.2)
Prothrombin Time: 21.9 seconds — ABNORMAL HIGH (ref 11.4–15.2)

## 2021-04-19 LAB — PHOSPHORUS: Phosphorus: 3.6 mg/dL (ref 2.5–4.6)

## 2021-04-19 LAB — COMPREHENSIVE METABOLIC PANEL
ALT: 9 U/L (ref 0–44)
AST: 12 U/L — ABNORMAL LOW (ref 15–41)
Albumin: 2.5 g/dL — ABNORMAL LOW (ref 3.5–5.0)
Alkaline Phosphatase: 67 U/L (ref 38–126)
Anion gap: 5 (ref 5–15)
BUN: 28 mg/dL — ABNORMAL HIGH (ref 8–23)
CO2: 21 mmol/L — ABNORMAL LOW (ref 22–32)
Calcium: 8.5 mg/dL — ABNORMAL LOW (ref 8.9–10.3)
Chloride: 110 mmol/L (ref 98–111)
Creatinine, Ser: 1.4 mg/dL — ABNORMAL HIGH (ref 0.61–1.24)
GFR, Estimated: 53 mL/min — ABNORMAL LOW (ref >60)
Glucose, Bld: 95 mg/dL (ref 70–99)
Potassium: 4.2 mmol/L (ref 3.5–5.1)
Sodium: 136 mmol/L (ref 135–145)
Total Bilirubin: 0.5 mg/dL (ref 0.3–1.2)
Total Protein: 4.6 g/dL — ABNORMAL LOW (ref 6.5–8.1)

## 2021-04-19 LAB — MAGNESIUM: Magnesium: 1.8 mg/dL (ref 1.7–2.4)

## 2021-04-19 LAB — PREPARE RBC (CROSSMATCH)

## 2021-04-19 LAB — APTT: aPTT: 31 seconds (ref 24–36)

## 2021-04-19 MED ORDER — WARFARIN SODIUM 5 MG PO TABS
5.0000 mg | ORAL_TABLET | Freq: Once | ORAL | Status: AC
  Administered 2021-04-19: 5 mg via ORAL
  Filled 2021-04-19: qty 1

## 2021-04-19 MED ORDER — PANTOPRAZOLE SODIUM 40 MG PO TBEC
40.0000 mg | DELAYED_RELEASE_TABLET | Freq: Every day | ORAL | Status: DC
  Administered 2021-04-20 – 2021-04-21 (×2): 40 mg via ORAL
  Filled 2021-04-19 (×2): qty 1

## 2021-04-19 MED ORDER — SODIUM CHLORIDE 0.9% IV SOLUTION: Freq: Once | INTRAVENOUS | Status: DC

## 2021-04-19 NOTE — Progress Notes (Signed)
Pt left for MRI.

## 2021-04-19 NOTE — Progress Notes (Addendum)
Pt back from echo, pt somnolent, easily arouses to answer questions, No focal neuro deficits noted. pt with limited insight to past medical conditions. Pt significant other at bedside support and helpful with past medical hx.

## 2021-04-19 NOTE — Progress Notes (Signed)
  Echocardiogram 2D Echocardiogram has been performed.  Garrett Terry 04/19/2021, 8:52 AM

## 2021-04-19 NOTE — ED Provider Notes (Signed)
[facility] Provider Note   CSN: [id number] Arrival date & time: 04/18/21  1525     History Chief Complaint  Patient presents with   Loss of Consciousness    Garrett Terry is a 75 y.o. male w/ hx of AVR on coumadin, HTN, bladder cancer, presenting to ED with near syncope.  Patient was feeling well today and out with his fiance, when he abruptly began complaining of shoulder and neck pain and dizziness.  His fiance says he turned pale and "slid down the side of a walk onto a sidewalk."  She caught him, no head trauma reported.  EMS states SBP was 70 on scene, they gave a small fluid bolus en route to hospital with improvement of BP.  Patient was reportedly initially responsive to sternal rub only on scene.  No seizure activity noted.  Patient had initially reported a headache on scene, but in the ED denies headache.  He reports left shoulder pain and left neck pain, along with vertigo.  He states he has never had these symptoms before.  He denies chest pain or pressure.  He denies fevers or chills.    HPI     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[place]) 03/28/2021   Closed compression fracture of body of L1 vertebra ([place]) 1/88/4166   Complication of anesthesia    PONV   Compression fracture of T12 vertebra ([place]) 03/28/2021   Depression    History of bladder cancer    History of pneumonia 1959   History of prostate cancer    History of scarlet fever 1956   History of stroke 2009   Hx of long term use of blood thinners    Hypertension    Long term current use of anticoagulant 03/22/2011   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    Patient Active Problem List   Diagnosis Date Noted   GI bleeding 04/19/2021   Syncope, vasovagal 04/18/2021   Nausea and vomiting 04/18/2021   Diverticulitis of sigmoid colon 04/18/2021   [place] grade B esophagitis 04/18/2021   Migraine headache with aura 04/18/2021   Alcohol abuse 04/18/2021    Generalized anxiety disorder 04/18/2021   Essential hypertension 04/18/2021   Shoulder pain 04/14/2021   Microcytic anemia 03/28/2021   Compression fracture of T12 vertebra ([place]) 03/28/2021   Closed compression fracture of body of L1 vertebra ([place]) 03/28/2021   Aortic atherosclerosis ([place]) 03/28/2021   GI bleed 03/27/2021   Acute blood loss anemia 03/27/2021   Hx of mechanical aortic valve replacement 03/27/2021   Alcohol dependence ([place]) 03/27/2021   Anxiety 10/27/2020   Asthma 05/09/2020   Erectile dysfunction 02/07/2020   Dysphagia 09/19/2016   History of cerebrovascular accident 07/23/2015   Allergic rhinitis 06/30/2015   Long term current use of anticoagulant 03/22/2011   Gastroesophageal reflux disease 07/07/2010   Mixed hyperlipidemia 07/07/2010   Heart valve disorder 07/07/2010    Past Surgical History:  Procedure Laterality Date   AORTIC VALVE REPLACEMENT     mechanical   BIOPSY  03/28/2021   Procedure: BIOPSY;  Surgeon: Ronald Lobo, MD;  Location: [facility];  Service: Endoscopy;;   BLADDER SURGERY  1983   Bladder Cancer Surgery    ESOPHAGOGASTRODUODENOSCOPY (EGD) WITH PROPOFOL N/A 03/28/2021   Procedure: ESOPHAGOGASTRODUODENOSCOPY (EGD) WITH PROPOFOL;  Surgeon: Ronald Lobo, MD;  Location: [facility];  Service: Endoscopy;  Laterality: N/A;   KNEE SURGERY Right 1980   KNEE SURGERY Left  1982   OTHER SURGICAL HISTORY  1992   stomach muscles removed due to Coumadin caused bleeding.   TONSILLECTOMY  1965       Family History  Problem Relation Age of Onset   Heart disease Neg Hx     Social History   Tobacco Use   Smoking status: Never   Smokeless tobacco: Current    Types: Snuff   Tobacco comments:    4-5 pouches a day  Vaping Use   Vaping Use: Never used  Substance Use Topics   Alcohol use: Yes    Comment: 15-20 drinks per week.   Drug use: Never    Home Medications Prior to Admission medications   Medication Sig Start Date End Date  Taking? Authorizing Provider  acetaminophen (TYLENOL) 500 MG tablet Take 500 mg by mouth daily as needed for moderate pain.   Yes [provider]  albuterol (VENTOLIN HFA) 108 (90 Base) MCG/ACT inhaler Inhale 1 puff into the lungs every 6 (six) hours as needed for wheezing or shortness of breath.   Yes [provider]  amLODipine (NORVASC) 5 MG tablet Take 1 tablet (5 mg total) by mouth daily. 03/10/21  Yes [name], NP  folic acid (FOLVITE) 1 MG tablet Take 1 tablet (1 mg total) by mouth daily. 03/30/21  Yes Samuella Cota, MD  furosemide (LASIX) 20 MG tablet Take 1 tablet (20 mg total) by mouth daily. Patient taking differently: Take 20 mg by mouth daily as needed for fluid or edema (3lb weight gain). 03/10/21  Yes [name], NP  Iron, Ferrous Sulfate, 325 (65 Fe) MG TABS Take 325 mg by mouth in the morning and at bedtime. 03/16/21  Yes [name], NP  LORazepam (ATIVAN) 0.5 MG tablet Take 1 tablet (0.5 mg total) by mouth as directed. One tablet in morning, and two tablets at night. Patient taking differently: Take 0.5 mg by mouth See admin instructions. 0.5mg  in the morning 1mg  in the evening 03/10/21  Yes Eubanks, Carlos American, NP  lovastatin (MEVACOR) 40 MG tablet Take 40 mg by mouth at bedtime.   Yes [provider]  pantoprazole (PROTONIX) 40 MG tablet Take 1 tablet (40 mg total) by mouth 2 (two) times daily. 03/10/21  Yes [name], NP  ramipril (ALTACE) 10 MG capsule Take 1 capsule (10 mg total) by mouth 2 (two) times daily. Patient taking differently: Take 20 mg by mouth daily. 03/10/21  Yes [name], NP  sertraline (ZOLOFT) 100 MG tablet Take 1 tablet (100 mg total) by mouth 2 (two) times daily. 03/10/21  Yes [name], NP  thiamine 100 MG tablet Take 100 mg by mouth daily.   Yes [provider]  warfarin (COUMADIN) 1 MG tablet Take 0.5 tablets (0.5 mg total) by mouth daily. Patient taking differently:  Take 0.5-1 mg by mouth See admin instructions. Take 2.5mg  daily alternating with 3mg  daily. Take 0.5mg  with 2mg  tablet alternating with 1mg  with 2mg  tablet. 03/10/21  Yes [name], NP  warfarin (COUMADIN) 2 MG tablet Take 1 tablet (2 mg total) by mouth daily. Along with 1/2 of 1 mg tablet for a total of 2.5 mg daily Patient taking differently: Take 2 mg by mouth See admin instructions. Take 2.5mg  daily alternating with 3mg  daily. 2mg  tablet and 0.5mg  alternating with 2mg  tablet and 1mg  tablet. 03/10/21  Yes [name], NP  busPIRone (BUSPAR) 5 MG tablet Take 1 tablet (5 mg total) by  mouth 2 (two) times daily. Patient not taking: No sig reported 03/10/21   [name], NP    Allergies    Aspirin, Morphine and related, and Promethazine  Review of Systems   Review of Systems  Constitutional:  Negative for chills and fever.  HENT:  Negative for ear pain and sore throat.   Eyes:  Negative for photophobia and visual disturbance.  Respiratory:  Negative for cough and shortness of breath.   Cardiovascular:  Negative for chest pain and palpitations.  Gastrointestinal:  Negative for abdominal pain and vomiting.  Genitourinary:  Negative for dysuria and hematuria.  Musculoskeletal:  Positive for back pain and neck pain.  Skin:  Negative for color change and rash.  Neurological:  Positive for dizziness and light-headedness. Negative for syncope.  All other systems reviewed and are negative.  Physical Exam Updated Vital Signs BP (!) 100/55   Pulse 83   Temp 97.8 F (36.6 C) (Oral)   Resp 16   SpO2 98%   Physical Exam Constitutional:      General: He is not in acute distress.    Comments: Lying on bed with eyes closed  HENT:     Head: Normocephalic and atraumatic.  Eyes:     Extraocular Movements: Extraocular movements intact.     Conjunctiva/sclera: Conjunctivae normal.     Pupils: Pupils are equal, round, and reactive to light.  Cardiovascular:     Rate and  Rhythm: Normal rate and regular rhythm.     Pulses: Normal pulses.     Heart sounds: Murmur heard.  Pulmonary:     Effort: Pulmonary effort is normal. No respiratory distress.  Abdominal:     General: There is no distension.     Tenderness: There is no abdominal tenderness.  Skin:    General: Skin is warm and dry.  Neurological:     General: No focal deficit present.     Mental Status: He is alert and oriented to person, place, and time. Mental status is at baseline.     Sensory: No sensory deficit.     Motor: No weakness.  Psychiatric:        Mood and Affect: Mood normal.        Behavior: Behavior normal.    ED Results / Procedures / Treatments   Labs (all labs ordered are listed, but only abnormal results are displayed) Labs Reviewed  COMPREHENSIVE METABOLIC PANEL - Abnormal; Notable for the following components:      Result Value   Sodium 133 (*)    Glucose, Bld 116 (*)    BUN 37 (*)    Creatinine, Ser 1.60 (*)    Calcium 8.5 (*)    Total Protein 5.5 (*)    Albumin 2.9 (*)    GFR, Estimated 45 (*)    All other components within normal limits  CBC WITH DIFFERENTIAL/PLATELET - Abnormal; Notable for the following components:   WBC 14.8 (*)    RBC 3.54 (*)    Hemoglobin 8.3 (*)    HCT 27.8 (*)    MCV 78.5 (*)    MCH 23.4 (*)    MCHC 29.9 (*)    RDW 19.8 (*)    Neutro Abs 12.9 (*)    All other components within normal limits  URINALYSIS, COMPLETE (UACMP) WITH MICROSCOPIC - Abnormal; Notable for the following components:   Color, Urine STRAW (*)    All other components within normal limits  RAPID URINE DRUG SCREEN, HOSP PERFORMED -  Abnormal; Notable for the following components:   Benzodiazepines POSITIVE (*)    All other components within normal limits  PROTIME-INR - Abnormal; Notable for the following components:   Prothrombin Time 20.6 (*)    INR 1.8 (*)    All other components within normal limits  CBC - Abnormal; Notable for the following components:   RBC  3.34 (*)    Hemoglobin 7.8 (*)    HCT 26.2 (*)    MCV 78.4 (*)    MCH 23.4 (*)    MCHC 29.8 (*)    RDW 19.9 (*)    All other components within normal limits  CBC - Abnormal; Notable for the following components:   RBC 2.99 (*)    Hemoglobin 7.1 (*)    HCT 23.0 (*)    MCV 76.9 (*)    MCH 23.7 (*)    RDW 20.0 (*)    All other components within normal limits  COMPREHENSIVE METABOLIC PANEL - Abnormal; Notable for the following components:   CO2 21 (*)    BUN 28 (*)    Creatinine, Ser 1.40 (*)    Calcium 8.5 (*)    Total Protein 4.6 (*)    Albumin 2.5 (*)    AST 12 (*)    GFR, Estimated 53 (*)    All other components within normal limits  PROTIME-INR - Abnormal; Notable for the following components:   Prothrombin Time 21.9 (*)    INR 1.9 (*)    All other components within normal limits  RESP PANEL BY RT-PCR (FLU A&B, COVID) ARPGX2  CULTURE, BLOOD (ROUTINE X 2)  CULTURE, BLOOD (ROUTINE X 2)  ETHANOL  C-REACTIVE PROTEIN  MAGNESIUM  PHOSPHORUS  APTT  CBG MONITORING, ED  TYPE AND SCREEN  PREPARE RBC (CROSSMATCH)  TROPONIN I (HIGH SENSITIVITY)  TROPONIN I (HIGH SENSITIVITY)    EKG EKG Interpretation  Date/Time:  Saturday April 18 2021 15:43:07 EDT Ventricular Rate:  86 PR Interval:  214 QRS Duration: 120 QT Interval:  398 QTC Calculation: 476 R Axis:   -4 Text Interpretation: Sinus rhythm Borderline prolonged PR interval Incomplete left bundle branch block No significant change from Mar 27 2021 Confirmed by Octaviano Glow [phone]) on 04/18/2021 3:53:33 PM Also confirmed by Octaviano Glow [phone]), editor Mariam Dollar [phone])  on 04/19/2021 2:19:48 PM  Radiology CT Head Wo Contrast  Result Date: 04/18/2021 CLINICAL DATA:  Headache. EXAM: CT HEAD WITHOUT CONTRAST TECHNIQUE: Contiguous axial images were obtained from the base of the skull through the vertex without intravenous contrast. COMPARISON:  None. FINDINGS: Brain: No evidence of acute hemorrhage, hydrocephalus,  extra-axial collection or mass lesion/mass effect. Small areas of hypoattenuation in the deep white matter of the frontal lobes, left basal ganglion left thalamus may represent age-indeterminate lacunar infarcts or asymmetric white matter microangiopathy. Vascular: Calcific atherosclerotic disease of the intra cavernous carotid arteries. Skull: Normal. Negative for fracture or focal lesion. Sinuses/Orbits: No acute finding. Other: None. IMPRESSION: Small areas of hypoattenuation in the deep white matter of the frontal lobes, left basal ganglia and left thalamus may represent age-indeterminate lacunar infarcts or asymmetric white matter microangiopathy. No evidence of intracranial hemorrhage. Electronically Signed   By: Fidela Salisbury M.D.   On: 04/18/2021 16:25   MR BRAIN WO CONTRAST  Result Date: 04/19/2021 CLINICAL DATA:  Headache EXAM: MRI HEAD WITHOUT CONTRAST TECHNIQUE: Multiplanar, multiecho pulse sequences of the brain and surrounding structures were obtained without intravenous contrast. COMPARISON:  None. FINDINGS: Brain: No acute infarct, mass effect  or extra-axial collection. Foci of chronic microhemorrhage in the right frontal and left parietal lobes. There is multifocal hyperintense T2-weighted signal within the white matter. Generalized volume loss without a clear lobar predilection. 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 The midline structures are normal. Vascular: Major flow voids are preserved. Skull and upper cervical spine: Normal calvarium and skull base. Visualized upper cervical spine and soft tissues are normal. Sinuses/Orbits:No paranasal sinus fluid levels or advanced mucosal thickening. No mastoid or middle ear effusion. Normal orbits. IMPRESSION: 1. No acute intracranial abnormality. 2. 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 Electronically Signed   By: Ulyses Jarred M.D.   On: 04/19/2021 01:49   DG Chest Portable 1  View  Result Date: 04/18/2021 CLINICAL DATA:  Syncope EXAM: PORTABLE CHEST 1 VIEW COMPARISON:  None. FINDINGS: Prior median sternotomy and cardiac valve replacement. Heart size is upper limits of normal. No focal airspace consolidation, pleural effusion, or pneumothorax. Advanced degenerative changes of the right shoulder. IMPRESSION: No active disease. Electronically Signed   By: Davina Poke D.O.   On: 04/18/2021 16:32   ECHOCARDIOGRAM COMPLETE  Result Date: 04/19/2021    ECHOCARDIOGRAM REPORT   Patient Name:   Garrett Terry [facility] Date of Exam: 04/19/2021 Medical Rec #:  [id number]         Height:       67.5 in Accession #:    [id number]        Weight:       167.4 lb Date of Birth:  05-11-1946         BSA:          1.885 m Patient Age:    14 years          BP:           94/56 mmHg Patient Gender: M                 HR:           83 bpm. Exam Location:  Inpatient Procedure: 2D Echo Indications:    syncope  History:        Patient has prior history of Echocardiogram examinations. Risk                 Factors:Dyslipidemia.                 Aortic Valve: mechanical valve is present in the aortic                 position.  Sonographer:    Johny Chess Referring Phys: [id number] [place]  1. Left ventricular ejection fraction, by estimation, is 60 to 65%. The left ventricle has normal function. The left ventricle has no regional wall motion abnormalities. There is mild left ventricular hypertrophy. Left ventricular diastolic parameters are consistent with Grade I diastolic dysfunction (impaired relaxation).  2. Right ventricular systolic function is normal. The right ventricular size is normal. There is normal pulmonary artery systolic pressure.  3. The mitral valve is abnormal. Trivial mitral valve regurgitation.  4. The aortic valve has been repaired/replaced. Aortic valve regurgitation is trivial. There is a mechanical valve present in the aortic position. Echo findings are consistent with  normal structure and function of the aortic valve prosthesis. Aortic valve mean gradient measures 14.0 mmHg. Normal occluder movement - no evidence of obstruction. DI is 0.44.  5. Aortic dilatation noted. There is mild dilatation of the ascending aorta, measuring 40 mm.  6.  The inferior vena cava is normal in size with greater than 50% respiratory variability, suggesting right atrial pressure of 3 mmHg. Comparison(s): No prior Echocardiogram. FINDINGS  Left Ventricle: Left ventricular ejection fraction, by estimation, is 60 to 65%. The left ventricle has normal function. The left ventricle has no regional wall motion abnormalities. The left ventricular internal cavity size was normal in size. There is  mild left ventricular hypertrophy. Left ventricular diastolic parameters are consistent with Grade I diastolic dysfunction (impaired relaxation). Indeterminate filling pressures. Right Ventricle: The right ventricular size is normal. No increase in right ventricular wall thickness. Right ventricular systolic function is normal. There is normal pulmonary artery systolic pressure. The tricuspid regurgitant velocity is 2.42 m/s, and  with an assumed right atrial pressure of 3 mmHg, the estimated right ventricular systolic pressure is 65.7 mmHg. Left Atrium: Left atrial size was normal in size. Right Atrium: Right atrial size was normal in size. Pericardium: There is no evidence of pericardial effusion. Mitral Valve: The mitral valve is abnormal. There is moderate calcification of the anterior mitral valve leaflet(s). Mild mitral annular calcification. Trivial mitral valve regurgitation. Tricuspid Valve: The tricuspid valve is grossly normal. Tricuspid valve regurgitation is trivial. Aortic Valve: The aortic valve has been repaired/replaced. Aortic valve regurgitation is trivial. Aortic valve mean gradient measures 14.0 mmHg. Aortic valve peak gradient measures 24.9 mmHg. Aortic valve area, by VTI measures 2.54 cm. There  is a mechanical valve present in the aortic position. Echo findings are consistent with normal structure and function of the aortic valve prosthesis. Pulmonic Valve: The pulmonic valve was grossly normal. Pulmonic valve regurgitation is trivial. Aorta: Aortic dilatation noted. There is mild dilatation of the ascending aorta, measuring 40 mm. Venous: The inferior vena cava is normal in size with greater than 50% respiratory variability, suggesting right atrial pressure of 3 mmHg. IAS/Shunts: No atrial level shunt detected by color flow Doppler.  LEFT VENTRICLE PLAX 2D LVIDd:         5.60 cm  Diastology LVIDs:         3.60 cm  LV e' medial:    8.16 cm/s LV PW:         1.20 cm  LV E/e' medial:  10.2 LV IVS:        1.10 cm  LV e' lateral:   9.46 cm/s LVOT diam:     2.70 cm  LV E/e' lateral: 8.8 LV SV:         110 LV SV Index:   58 LVOT Area:     5.73 cm  RIGHT VENTRICLE             IVC RV S prime:     12.50 cm/s  IVC diam: 1.50 cm TAPSE (M-mode): 2.1 cm LEFT ATRIUM             Index       RIGHT ATRIUM           Index LA diam:        4.20 cm 2.23 cm/m  RA Area:     17.60 cm LA Vol (A2C):   48.5 ml 25.72 ml/m RA Volume:   44.70 ml  23.71 ml/m LA Vol (A4C):   47.2 ml 25.04 ml/m LA Biplane Vol: 52.3 ml 27.74 ml/m  AORTIC VALVE AV Area (Vmax):    2.40 cm AV Area (Vmean):   2.35 cm AV Area (VTI):     2.54 cm AV Vmax:  249.50 cm/s AV Vmean:          172.000 cm/s AV VTI:            0.432 m AV Peak Grad:      24.9 mmHg AV Mean Grad:      14.0 mmHg LVOT Vmax:         104.50 cm/s LVOT Vmean:        70.700 cm/s LVOT VTI:          0.192 m LVOT/AV VTI ratio: 0.44  AORTA Ao Asc diam: 4.00 cm MITRAL VALVE                TRICUSPID VALVE MV Area (PHT): 4.06 cm     TR Peak grad:   23.4 mmHg MV Decel Time: 187 msec     TR Vmax:        242.00 cm/s MV E velocity: 83.20 cm/s MV A velocity: 127.00 cm/s  SHUNTS MV E/A ratio:  0.66         Systemic VTI:  0.19 m                             Systemic Diam: 2.70 cm Lyman Bishop  MD Electronically signed by Lyman Bishop MD Signature Date/Time: 04/19/2021/12:36:23 PM    Final    CT Angio Chest/Abd/Pel for Dissection W and/or Wo Contrast  Result Date: 04/18/2021 CLINICAL DATA:  Chest and abdominal pain EXAM: CT ANGIOGRAPHY CHEST, ABDOMEN AND PELVIS TECHNIQUE: Non-contrast CT of the chest was initially obtained. Multidetector CT imaging through the chest, abdomen and pelvis was performed using the standard protocol during bolus administration of intravenous contrast. Multiplanar reconstructed images and MIPs were obtained and reviewed to evaluate the vascular anatomy. CONTRAST:  137mL OMNIPAQUE IOHEXOL 350 MG/ML SOLN COMPARISON:  Chest x-ray from earlier in the same day, CT of the abdomen and pelvis from 03/27/2021. FINDINGS: CTA CHEST FINDINGS Cardiovascular: Initial precontrast images were obtained and reveal atherosclerotic calcification. No aneurysmal dilatation is noted. Prior aortic valve replacement is seen. Post-contrast images demonstrate atherosclerotic calcification of the thoracic aorta. No dissection is noted. The ascending aorta is prominent to 4 cm. Coronary calcifications are noted. The pulmonary artery as visualized is within normal limits. Mediastinum/Nodes: Thoracic inlet is within normal limits. No sizable hilar or mediastinal adenopathy is noted. The esophagus as visualized is within normal limits with the exception of a small sliding-type hiatal hernia. Some calcified mediastinal nodes are seen consistent with prior granulomatous disease. Lungs/Pleura: Lungs are well aerated bilaterally. A few scattered calcified granulomas are seen. No sizable effusion is noted. No focal infiltrate is seen. Musculoskeletal: Mild degenerative changes of the thoracic spine are noted. Multiple old healed rib fractures are noted on the right. Chronic appearing T12 compression deformity is noted. Review of the MIP images confirms the above findings. CTA ABDOMEN AND PELVIS FINDINGS VASCULAR  Aorta: Atherosclerotic calcifications are noted without aneurysmal dilatation or dissection. Celiac: Mild atherosclerotic changes are noted without focal significant stenosis. SMA: Mild atherosclerotic calcifications are noted without focal significant stenosis. Renals: Both renal arteries are patent without evidence of aneurysm, dissection, vasculitis, fibromuscular dysplasia or significant stenosis. IMA: Patent without evidence of aneurysm, dissection, vasculitis or significant stenosis. Iliacs: Atherosclerotic calcifications are noted without aneurysmal dilatation or dissection. Veins: No specific venous abnormality is noted. Review of the MIP images confirms the above findings. NON-VASCULAR Hepatobiliary: No focal liver abnormality is seen. No gallstones, gallbladder wall thickening, or biliary dilatation.  Pancreas: Unremarkable. No pancreatic ductal dilatation or surrounding inflammatory changes. Spleen: Spleen demonstrates scattered calcified granulomas. No focal abnormality is noted. Adrenals/Urinary Tract: Adrenal glands are within normal limits. Kidneys demonstrate a normal enhancement pattern bilaterally. No renal calculi or obstructive changes are seen. The bladder is well distended. Stomach/Bowel: Diverticular change of the colon is noted. Very mild inflammatory changes are seen within the descending colon as well as the junction of the descending and ascending colons. No evidence of perforation or abscess formation is seen. Appendix is not well visualized although no inflammatory changes are seen to suggest appendicitis. Small bowel and stomach are within normal limits. Lymphatic: No significant lymphadenopathy is identified. Reproductive: Prostate is unremarkable. Other: No abdominal wall hernia or abnormality. No abdominopelvic ascites. Musculoskeletal: Chronic L1 compression deformity is noted of lesser degree than that seen at T12. This is also stable from the prior exam. Review of the MIP images  confirms the above findings. IMPRESSION: Changes of mild diverticulitis in the descending and sigmoid colon. No perforation or abscess formation is seen. 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 No evidence of dissection is noted within the thoracic or abdominal aorta. Recommend annual imaging followup by CTA or MRA. This recommendation follows 2010 ACCF/AHA/AATS/ACR/ASA/SCA/SCAI/SIR/STS/SVM Guidelines for the Diagnosis and Management of Patients with Thoracic Aortic Disease. Circulation. 2010; 121: D176-H607. Aortic aneurysm NOS (ICD10-I71.9) Hiatal hernia. Changes of prior granulomatous disease. Chronic T12 and L1 compression deformities. Aortic Atherosclerosis (ICD10-I70.0). Electronically Signed   By: Inez Catalina M.D.   On: 04/18/2021 18:35    Procedures Procedures   Medications Ordered in ED Medications  metroNIDAZOLE (FLAGYL) IVPB 500 mg (500 mg Intravenous New Bag/Given 04/19/21 1214)  pravastatin (PRAVACHOL) tablet 40 mg (has no administration in time range)  LORazepam (ATIVAN) tablet 0.5 mg (0.5 mg Oral Not Given 04/19/21 1000)  sertraline (ZOLOFT) tablet 100 mg (100 mg Oral Given 04/18/21 2254)  traZODone (DESYREL) tablet 150 mg (150 mg Oral Given 04/18/21 2253)  multivitamin with minerals tablet 1 tablet (1 tablet Oral Given 04/19/21 1023)  thiamine tablet 100 mg (100 mg Oral Given 04/19/21 1023)  albuterol (VENTOLIN HFA) 108 (90 Base) MCG/ACT inhaler 1 puff (has no administration in time range)  loratadine (CLARITIN) tablet 10 mg (10 mg Oral Given 04/19/21 1023)  acetaminophen (TYLENOL) tablet 650 mg (650 mg Oral Given 04/19/21 1044)    Or  acetaminophen (TYLENOL) suppository 650 mg ( Rectal See Alternative 04/19/21 1044)  polyethylene glycol (MIRALAX / GLYCOLAX) packet 17 g (17 g Oral Given 04/19/21 1044)  ondansetron (ZOFRAN) tablet 4 mg (has no administration in time range)    Or  ondansetron (ZOFRAN) injection 4 mg (has no administration in time range)  0.9 %  sodium chloride  infusion ( Intravenous New Bag/Given 04/18/21 2257)  cefTRIAXone (ROCEPHIN) 1 g in sodium chloride 0.9 % 100 mL IVPB (0 g Intravenous Duplicate 3/71/06 2694)  Warfarin - Pharmacist Dosing Inpatient (has no administration in time range)  0.9 %  sodium chloride infusion (Manually program via Guardrails IV Fluids) (has no administration in time range)  pantoprazole (PROTONIX) EC tablet 40 mg (has no administration in time range)  warfarin (COUMADIN) tablet 5 mg (has no administration in time range)  lactated ringers bolus 1,000 mL (0 mLs Intravenous Stopped 04/18/21 1701)  iohexol (OMNIPAQUE) 350 MG/ML injection 100 mL (100 mLs Intravenous Contrast Given 04/18/21 1750)  fentaNYL (SUBLIMAZE) injection 50 mcg (50 mcg Intravenous Given 04/18/21 1916)  metoCLOPramide (REGLAN) injection 10 mg (10 mg Intravenous Given  04/18/21 2255)    And  diphenhydrAMINE (BENADRYL) injection 25 mg (25 mg Intravenous Given 04/18/21 2253)  warfarin (COUMADIN) tablet 5 mg (5 mg Oral Given 04/18/21 2254)    ED Course  I have reviewed the triage vital signs and the nursing notes.  Pertinent labs & imaging results that were available during my care of the patient were reviewed by me and considered in my medical decision making (see chart for details).  This patient presents with complaint of neck pain, back pain in setting of near syncope, hypotension. This involves an extensive number of treatment options, and is a complaint that carries with it a high risk of complications and morbidity.  The differential diagnosis includes ACS vs CVA vs dehydration vs anemia vs aortic dissection vs other  Initially he was hypotensive on arrival, but his BP rapidly stabilized and improved with IV fluids.  We obtained a stat CTH to evaluate for SAH/brain bleed which was unremarkable aside from questionably-aged, possible lacunar infarct.  He did not have neurological deficits to correspond with this.  Follow up MRI ordered to better evaluate this  lesion.  Following CTH, I was concerned about ruling out an aortic dissection.  He reported shoulder and neck pain, in setting of hypotension.  CTA ordered which did not show acute dissection or vascular injury, nor evidence of PNA.  CT abdomen showing possible mild diverticulitis.  IV fentanyl ordered for pain with some improvement Labs reviewed - CMP suggested dehydration - BUN/Cr elevation.  CBC with leukocytosis initially, hgb low but stable from priors. UA without sign of infection Covid negative Trop 10  With this workup I had a lower suspicion for large PE, symptomatic anemia, ACS, stroke, pneumonia, or aortic dissection.  He had no meningismus or fever on exam to suggest meningitis or CNS infection.    It is not clear what the etiology of these symptoms are, but may be 2/2 abdominal infection.  We advised medical admission for echocardiogram, antibiotics, fluids, and continued monitoring on telemetry.   Clinical Course as of 04/19/21 1538  Sat Apr 18, 2021  1551 75 yo male on coumadin for AVR presenting with syncope.  He reports acute onset of headache proceeding this.  His fiance helped him to the ground, no head trauma.  EMS reports hypotension, SBP 70's on scene.  He vomited x 1 .  Here BP on my exam is 101/60, HR normal.  Pt reporting significant discomfort in left shoulder blade and left neck, non-positional.  Denying headache to me.  CTH obtained on arrival.  We'll likely need a stat CT dissection study given his hypotension and back/neck pain.  Labs are pending.  IV fluids running.  His fiance is present at bedside. [MT]  70 CT unclear aged lacunar infarct.  Will consult neurology.   [MT]  0938 BP remains stable [MT]  1829 Neurology recommending MR brain to r/o lacunar infarct with CT read though would be atypical presentation for this. Will r/o dissection first before proceeding with any MR imaging. [ZB]  9371 CT Angio Chest/Abd/Pel for Dissection W and/or Wo Contrast [MT]   1909 No acute dissection on CT imaging.  Vitals and BP have remained stable after fluids.  At this time we will anticipate medical admission for hypotension/chest pain/syncope evaluation.  I discussed the CT imaging findings with neurologist Dr Cheral Marker who felt this was likely a chronic finding, particularly in this clinical context of hypotension and syncope, but agreed with MR imaging of the brain to  better evaluate the lesion.  We will order the MRI and admit to the hospitalist. [MT]  1910 Although he has a hx of hematemesis and low hgb on prior hospital visit last month, I have a lower suspicion for sepsis or acute GI bleed at this time.  Hgb is 8.3 closer to baseline levels.  Pt will need close monitoring in the hospital however [MT]    Clinical Course User Index [MT] Seville Downs, Carola Rhine, MD [ZB] Pearson Grippe, DO    Final Clinical Impression(s) / ED Diagnoses Final diagnoses:  Syncope, unspecified syncope type  Current use of long term anticoagulation  Hypotension, unspecified hypotension type    Rx / DC Orders ED Discharge Orders     None        Wyvonnia Dusky, MD 04/19/21 1612

## 2021-04-19 NOTE — Progress Notes (Signed)
ANTICOAGULATION CONSULT NOTE - Initial Consult  Pharmacy Consult for warfarin Indication: Mech AVR  Allergies  Allergen Reactions   Aspirin     Other reaction(s): Other (See Comments) Can't take due to taking blood thinners   Morphine And Related Other (See Comments)    Other reaction(s): Other (See Comments) Hallucinations     Promethazine Other (See Comments)    Other reaction(s): Other (See Comments) Hallucinations     Patient Measurements:    Vital Signs: Temp: 98.6 F (37 C) (06/19 1219) Temp Source: Oral (06/19 1219) BP: 98/52 (06/19 1219) Pulse Rate: 76 (06/19 1219)  Labs: Recent Labs    04/18/21 1607 04/18/21 1834 04/18/21 2219 04/19/21 0438  HGB 8.3*  --  7.8* 7.1*  HCT 27.8*  --  26.2* 23.0*  PLT 345  --  268 294  APTT  --   --   --  31  LABPROT 20.6*  --   --  21.9*  INR 1.8*  --   --  1.9*  CREATININE 1.60*  --   --  1.40*  TROPONINIHS 10 10  --   --      Estimated Creatinine Clearance: 44.1 mL/min (A) (by C-G formula based on SCr of 1.4 mg/dL (H)).   Medical History: 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[place]) 03/28/2021   Closed compression fracture of body of L1 vertebra ([place]) 01/22/4009   Complication of anesthesia    PONV   Compression fracture of T12 vertebra ([facility]) 03/28/2021   Depression    History of bladder cancer    History of pneumonia 1959   History of prostate cancer    History of scarlet fever 1956   History of stroke 2009   Hx of long term use of blood thinners    Hypertension    Long term current use of anticoagulant 03/22/2011   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   Assessment: 52 YOM presenting with N/V and HA, hx of mech AVR on warfarin PTA.  INR on admission subtherapeutic at 1.8, last dose 6/17 (2.5mg )  PTA dosing: 2.5mg  alternating 3mg  every other day (6/8 clinic notes say he is to take 3mg  daily instead d/t INR of 1.6)  Goal of Therapy:  INR 2.5-3.5 Monitor platelets by anticoagulation  protocol: Yes   Plan:  Warfarin 5 mg PO x 1 Daily INR, s/s bleeding  Barth Kirks, PharmD, BCCCP Clinical Pharmacist [phone]  Please check AMION for all [place] numbers  04/19/2021 12:45 PM

## 2021-04-19 NOTE — ED Provider Notes (Signed)
.  Critical Care  Date/Time: 04/19/2021 10:25 AM Performed by: Wyvonnia Dusky, MD Authorized by: Wyvonnia Dusky, MD   Critical care provider statement:    Critical care time (minutes):  45   Critical care was necessary to treat or prevent imminent or life-threatening deterioration of the following conditions:  Circulatory failure   Critical care was time spent personally by me on the following activities:  Discussions with consultants, evaluation of patient's response to treatment, examination of patient, ordering and performing treatments and interventions, ordering and review of laboratory studies, ordering and review of radiographic studies, pulse oximetry, re-evaluation of patient's condition, obtaining history from patient or surrogate and review of old charts    Amando Chaput, Carola Rhine, MD 04/19/21 1025

## 2021-04-19 NOTE — Progress Notes (Signed)
PROGRESS NOTE    Garrett Terry  IPJ:[id number] DOB: Dec 08, 1945 DOA: 04/18/2021 PCP: [name], NP    Brief Narrative:  Garrett Terry was admitted with the working diagnosis of sigmoid diverticulitis complicated with lower GI bleed and acute blood loss anemia.   75 year old male with a past medical history for mechanical aortic valve replacement, alcohol abuse, generalized anxiety disorder, esophagitis, hypertension, dyslipidemia and migraines who presented with nausea, vomiting, headaches and syncope. Acute onset of symptoms on day of hospitalization, patient had severe weakness and generalized malaise that evolved into abdominal pain, nausea and vomiting.  He had a brief syncope episode.  When EMS arrived her systolic blood pressure was 72.  After initial IV fluids her blood pressure 102/69, heart rate 74, respiratory rate 20, oxygen saturation 100%, his lungs were clear to auscultation bilaterally, heart S1-S2, present, rhythmic, soft abdomen, tender to palpation in periumbilical region, present bowel sounds, no lower extremity edema.  Sodium 133, potassium 4.2, chloride 106, bicarb 22, glucose 116, BUN 37, creatinine 1.60, white count 14.8, hemoglobin 8.3, hematocrit 27.8, platelets 345.  INR 1.8. SARS COVID-19 negative. Urinalysis specific gravity 1.018, 0-5 white cells.  Head CT with small areas of hypoattenuation in the deep white matter of the frontal lobes, left basal ganglia and left thalamus may represent age-indeterminate lacunar infarcts or asymmetric white matter microangiopathy.  No acute intracranial changes.  Chest radiograph no infiltrates.  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  No dissection.  EKG 86 bpm, normal axis normal intervals, first-degree AV block, sinus rhythm, no ST segment or T wave changes, positive LVH.  Assessment  & Plan:   Principal Problem:   Diverticulitis of sigmoid colon Active Problems:   Mixed hyperlipidemia   Hx of mechanical aortic valve replacement   Syncope, vasovagal   Nausea and vomiting   [place] grade B esophagitis   Migraine headache with aura   Alcohol abuse   Generalized anxiety disorder   Essential hypertension   Sigmoid diverticulitis, complicated with acute blood loss anemia, lower GI bleed. Today with no frank melena, he has been constipated per his significant other at the bedside. His hgb is 7,1 with Hct at 23.0. Plt at 294 and INR at 1,9. Wbc is 8,2 and he continue to be afebrile.   Upper endoscopy with esophagitis 03/2021.   Plan to continue medical therapy with ceftriaxone and metronidazole for antibiotic therapy.  Add 2 units PRBC and follow up cell count in am.  Advance die to soft as tolerated, Ok to change pantoprazole to po for now.   2. SP aortic valve replacement (mechanical), vasovagal syncope. No clinica signs of heart failure.  Continue telemetry monitoring for now and follow on echocardiogram. INR is 1,9, continue warfarin per pharmacy protocol, target INR 2 to 3.   3. HTN/ dyslipidemia. Holding amlodipine,  ramipril and diuretic therapy to prevent hypotension.   Continue with pravastatin.   4. Anxiety, alcohol abuse disorder. No clinical signs of withdrawal, continue close neuro checks per unit protocol.  On as needed oral lorazepam, plus trazodone and sertraline.   Holding buspirone.   5. AKI on CKD stage 3a. Renal function with serum cr at 1,40 with K at 4,2 and bicarbonate at 4,2.  Follow up renal function in am, avoid hypotension and nephrotoxic medications.   Patient continue to be at high risk for worsening anemia and diverticulitis.  Status is: Observation  The patient will require care spanning > 2 midnights and should be moved to inpatient because: IV treatments appropriate due to intensity of illness or inability to take  PO  Dispo: The patient is from: Home              Anticipated d/c is to: Home              Patient currently is not medically stable to d/c.   Difficult to place patient No   DVT prophylaxis: Warfarin   Code Status:   DNR   Family Communication:  I spoke with patient's significant other at the bedside, we talked in detail about patient's condition, plan of care and prognosis and all questions were addressed.     Antimicrobials:  Ceftriaxone and metronidazole     Subjective: Patient somnolent this am but easy to arouse, complains of improvement in abdominal pain but not yet back to baseline, continue with poor oral intake.   Objective: Vitals:   04/18/21 1915 04/18/21 2152 04/18/21 2333 04/19/21 0540  BP: 115/71 124/67 129/61 (!) 94/56  Pulse: 76 77 95 89  Resp: 15 17 18 17   Temp:  98.3 F (36.8 C) (!) 97.5 F (36.4 C) 98 F (36.7 C)  TempSrc:  Oral Oral Oral  SpO2: 100% 100% 95% 96%    Intake/Output Summary (Last 24 hours) at 04/19/2021 1124 Last data filed at 04/18/2021 2259 Gross per 24 hour  Intake 1240 ml  Output --  Net 1240 ml   There were no vitals filed for this visit.  Examination:   General: Not in pain or dyspnea  Neurology: Awake and alert, non focal  E ENT: positive pallor, no icterus, oral mucosa moist Cardiovascular: No JVD. S1-S2 present, rhythmic, no gallops, rubs, or murmurs. No lower extremity edema. Pulmonary: vesicular breath sounds bilaterally, adequate air movement, no wheezing, rhonchi or rales. Gastrointestinal. Abdomen soft, mild tender to deep palpation, with no rebound or guarding Skin. No rashes Musculoskeletal: no joint deformities     Data Reviewed: I have personally reviewed following labs and imaging studies  CBC: Recent Labs  Lab 04/14/21 0922 04/18/21 1607 04/18/21 2219 04/19/21 0438  WBC 6.7 14.8* 9.6 8.2  NEUTROABS  --  12.9*  --   --   HGB 9.8* 8.3* 7.8* 7.1*  HCT 32.2* 27.8* 26.2* 23.0*  MCV 75.4* 78.5* 78.4*  76.9*  PLT 446* 345 268 341   Basic Metabolic Panel: Recent Labs  Lab 04/18/21 1607 04/19/21 0438  NA 133* 136  K 4.2 4.2  CL 106 110  CO2 22 21*  GLUCOSE 116* 95  BUN 37* 28*  CREATININE 1.60* 1.40*  CALCIUM 8.5* 8.5*  MG  --  1.8  PHOS  --  3.6   GFR: Estimated Creatinine Clearance: 44.1 mL/min (A) (by C-G formula based on SCr of 1.4 mg/dL (H)). Liver Function Tests: Recent Labs  Lab 04/18/21 1607 04/19/21 0438  AST 15 12*  ALT 11 9  ALKPHOS 77 67  BILITOT 0.5 0.5  PROT 5.5* 4.6*  ALBUMIN 2.9* 2.5*   No results for input(s): LIPASE, AMYLASE in the last 168 hours. No results for input(s): AMMONIA in the last 168 hours. Coagulation Profile: Recent Labs  Lab 04/14/21 0920 04/18/21 1607 04/19/21 0438  INR 2.2 1.8* 1.9*   Cardiac Enzymes: No results for input(s): CKTOTAL, CKMB, CKMBINDEX, TROPONINI in the last 168 hours. BNP (last 3 results) No results for input(s): PROBNP in the last 8760  hours. HbA1C: No results for input(s): HGBA1C in the last 72 hours. CBG: Recent Labs  Lab 04/18/21 1647  GLUCAP 84   Lipid Profile: No results for input(s): CHOL, HDL, LDLCALC, TRIG, CHOLHDL, LDLDIRECT in the last 72 hours. Thyroid Function Tests: No results for input(s): TSH, T4TOTAL, FREET4, T3FREE, THYROIDAB in the last 72 hours. Anemia Panel: No results for input(s): VITAMINB12, FOLATE, FERRITIN, TIBC, IRON, RETICCTPCT in the last 72 hours.    Radiology Studies: I have reviewed all of the imaging during this hospital visit personally     Scheduled Meds:  amLODipine  5 mg Oral Daily   loratadine  10 mg Oral Daily   LORazepam  0.5 mg Oral TID   multivitamin with minerals  1 tablet Oral Daily   pantoprazole (PROTONIX) IV  40 mg Intravenous Q12H   pravastatin  40 mg Oral q1800   sertraline  100 mg Oral BID   thiamine  100 mg Oral Daily   traZODone  150 mg Oral QHS   Warfarin - Pharmacist Dosing Inpatient   Does not apply q1600   Continuous Infusions:   cefTRIAXone (ROCEPHIN)  IV 1 g (04/18/21 2326)   metronidazole 500 mg (04/19/21 0551)     LOS: 0 days        Bethanie Bloxom Gerome Apley, MD

## 2021-04-20 DIAGNOSIS — F411 Generalized anxiety disorder: Secondary | ICD-10-CM | POA: Diagnosis not present

## 2021-04-20 DIAGNOSIS — F1010 Alcohol abuse, uncomplicated: Secondary | ICD-10-CM | POA: Diagnosis not present

## 2021-04-20 DIAGNOSIS — K5732 Diverticulitis of large intestine without perforation or abscess without bleeding: Secondary | ICD-10-CM | POA: Diagnosis not present

## 2021-04-20 DIAGNOSIS — I10 Essential (primary) hypertension: Secondary | ICD-10-CM | POA: Diagnosis not present

## 2021-04-20 LAB — BASIC METABOLIC PANEL
Anion gap: 4 — ABNORMAL LOW (ref 5–15)
BUN: 18 mg/dL (ref 8–23)
CO2: 22 mmol/L (ref 22–32)
Calcium: 8.6 mg/dL — ABNORMAL LOW (ref 8.9–10.3)
Chloride: 111 mmol/L (ref 98–111)
Creatinine, Ser: 1.4 mg/dL — ABNORMAL HIGH (ref 0.61–1.24)
GFR, Estimated: 53 mL/min — ABNORMAL LOW (ref >60)
Glucose, Bld: 87 mg/dL (ref 70–99)
Potassium: 4.1 mmol/L (ref 3.5–5.1)
Sodium: 137 mmol/L (ref 135–145)

## 2021-04-20 LAB — BPAM RBC
Blood Product Expiration Date: 202207182359
Blood Product Expiration Date: 202207182359
ISSUE DATE / TIME: 202206191402
ISSUE DATE / TIME: 202206191738
Unit Type and Rh: 5100
Unit Type and Rh: 5100

## 2021-04-20 LAB — CBC WITH DIFFERENTIAL/PLATELET
Abs Immature Granulocytes: 0.02 10*3/uL (ref 0.00–0.07)
Basophils Absolute: 0 10*3/uL (ref 0.0–0.1)
Basophils Relative: 1 %
Eosinophils Absolute: 0.3 10*3/uL (ref 0.0–0.5)
Eosinophils Relative: 5 %
HCT: 31.4 % — ABNORMAL LOW (ref 39.0–52.0)
Hemoglobin: 9.8 g/dL — ABNORMAL LOW (ref 13.0–17.0)
Immature Granulocytes: 0 %
Lymphocytes Relative: 14 %
Lymphs Abs: 1 10*3/uL (ref 0.7–4.0)
MCH: 25.1 pg — ABNORMAL LOW (ref 26.0–34.0)
MCHC: 31.2 g/dL (ref 30.0–36.0)
MCV: 80.3 fL (ref 80.0–100.0)
Monocytes Absolute: 0.6 10*3/uL (ref 0.1–1.0)
Monocytes Relative: 9 %
Neutro Abs: 4.8 10*3/uL (ref 1.7–7.7)
Neutrophils Relative %: 71 %
Platelets: 252 10*3/uL (ref 150–400)
RBC: 3.91 MIL/uL — ABNORMAL LOW (ref 4.22–5.81)
RDW: 19.9 % — ABNORMAL HIGH (ref 11.5–15.5)
WBC: 6.7 10*3/uL (ref 4.0–10.5)
nRBC: 0 % (ref 0.0–0.2)

## 2021-04-20 LAB — TYPE AND SCREEN
ABO/RH(D): O POS
Antibody Screen: NEGATIVE
Unit division: 0
Unit division: 0

## 2021-04-20 LAB — PROTIME-INR
INR: 2.6 — ABNORMAL HIGH (ref 0.8–1.2)
Prothrombin Time: 27.8 seconds — ABNORMAL HIGH (ref 11.4–15.2)

## 2021-04-20 LAB — HEMOGLOBIN AND HEMATOCRIT, BLOOD
HCT: 28.6 % — ABNORMAL LOW (ref 39.0–52.0)
Hemoglobin: 9.1 g/dL — ABNORMAL LOW (ref 13.0–17.0)

## 2021-04-20 MED ORDER — LISINOPRIL 10 MG PO TABS
10.0000 mg | ORAL_TABLET | Freq: Every day | ORAL | Status: DC
  Administered 2021-04-20 – 2021-04-21 (×2): 10 mg via ORAL
  Filled 2021-04-20 (×2): qty 1

## 2021-04-20 MED ORDER — POLYVINYL ALCOHOL 1.4 % OP SOLN
1.0000 [drp] | OPHTHALMIC | Status: DC | PRN
  Administered 2021-04-20: 1 [drp] via OPHTHALMIC
  Filled 2021-04-20 (×2): qty 15

## 2021-04-20 MED ORDER — AMOXICILLIN-POT CLAVULANATE 500-125 MG PO TABS
1.0000 | ORAL_TABLET | Freq: Two times a day (BID) | ORAL | Status: DC
  Filled 2021-04-20 (×2): qty 1

## 2021-04-20 MED ORDER — AMOXICILLIN-POT CLAVULANATE 500-125 MG PO TABS
1.0000 | ORAL_TABLET | Freq: Two times a day (BID) | ORAL | Status: DC
  Administered 2021-04-20 – 2021-04-21 (×2): 500 mg via ORAL
  Filled 2021-04-20 (×3): qty 1

## 2021-04-20 MED ORDER — WARFARIN SODIUM 1 MG PO TABS
1.0000 mg | ORAL_TABLET | Freq: Once | ORAL | Status: AC
  Administered 2021-04-20: 1 mg via ORAL
  Filled 2021-04-20: qty 1

## 2021-04-20 NOTE — Progress Notes (Addendum)
PROGRESS NOTE    Garrett Terry  ZGY:[id number] DOB: [birth year] DOA: 04/18/2021 PCP: [name], NP    Brief Narrative:  [name] was admitted with the working diagnosis of sigmoid diverticulitis complicated with lower GI bleed and acute blood loss anemia.    75 year old male with a past medical history for mechanical aortic valve replacement, alcohol abuse, generalized anxiety disorder, esophagitis, hypertension, dyslipidemia and migraines who presented with nausea, vomiting, headaches and syncope. Acute onset of symptoms on day of hospitalization, patient had severe weakness and generalized malaise that evolved into abdominal pain, nausea and vomiting.  He had a brief syncope episode.  When EMS arrived her systolic blood pressure was 72.  After initial IV fluids her blood pressure 102/69, heart rate 74, respiratory rate 20, oxygen saturation 100%, his lungs were clear to auscultation bilaterally, heart S1-S2, present, rhythmic, soft abdomen, tender to palpation in periumbilical region, present bowel sounds, no lower extremity edema.   Sodium 133, potassium 4.2, chloride 106, bicarb 22, glucose 116, BUN 37, creatinine 1.60, white count 14.8, hemoglobin 8.3, hematocrit 27.8, platelets 345.  INR 1.8. SARS COVID-19 negative. Urinalysis specific gravity 1.018, 0-5 white cells.   Head CT with small areas of hypoattenuation in the deep white matter of the frontal lobes, left basal ganglia and left thalamus may represent age-indeterminate lacunar infarcts or asymmetric white matter microangiopathy.  No acute intracranial changes.   Chest radiograph no infiltrates.   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  No dissection.   EKG 86 bpm, normal axis normal intervals, first-degree AV block, sinus rhythm, no ST segment or T wave changes, positive  LVH.   Assessment & Plan:   Principal Problem:   Diverticulitis of sigmoid colon Active Problems:   Mixed hyperlipidemia   Hx of mechanical aortic valve replacement   Syncope, vasovagal   Nausea and vomiting   [place] grade B esophagitis   Migraine headache with aura   Alcohol abuse   Generalized anxiety disorder   Essential hypertension   GI bleeding     Sigmoid diverticulitis, complicated with acute blood loss anemia, lower GI bleed. Upper endoscopy with esophagitis 03/2021.  Today patient is more awake and reactive, sp 2 units PRBC transfusion, with follow up Hgb at 9,8 and Hct at 31,4 Continue antibiotic therapy with Augmentin and follow up cell count in am.   Plan for outpatient colonoscopy per GI.  Advance diet to regular   2. SP aortic valve replacement (mechanical), vasovagal syncope.  Tolerated well PRBC transfusion, no signs of hypervolemia.   Echocardiogram with preserved LV systolic function EF 60 to 65% with no aortic valve dysfunction.  INR 2,6, continue with warfarin per pharmacy protocol.    3. HTN/ dyslipidemia.  Hypotension has resolved will resume ace inh but will continue to hold on diuretic therapy and amlodipine for now.   On pravastatin.   4. Anxiety, alcohol abuse disorder. No signs of acute clinical withdrawal,   On PRN oral lorazepam, plus trazodone and sertraline.   resume buspirone.   5. AKI on CKD stage 3a. Patient tolerating po well, no nausea or vomiting, renal function continue to be stable with serum cr at 1,40 with K at 4,1 and serum bicarbonate at 22.     Status is: Inpatient  Remains inpatient appropriate because:Inpatient level of care appropriate due to severity of illness  Dispo: The patient is from:  Home              Anticipated d/c is to: Home              Patient currently is not medically stable to d/c.   Difficult to place patient No   DVT prophylaxis: Warfarin   Code Status:   DNR   Family Communication:  I  spoke with patient's son and significant other (daughter over the phone) at the bedside, we talked in detail about patient's condition, plan of care and prognosis and all questions were addressed.    Subjective: Patient is feeling better, no nausea or vomiting, no chest pain or dyspnea, no melena or hematohezia   Objective: Vitals:   04/19/21 1815 04/19/21 2304 04/20/21 0421 04/20/21 1215  BP: (!) 102/57 116/60 110/60 (!) 144/73  Pulse: 85 76 82 93  Resp: 20 19 16 16   Temp: 98.3 F (36.8 C) 98.3 F (36.8 C) 98.8 F (37.1 C) 98.3 F (36.8 C)  TempSrc: Oral Oral Oral Oral  SpO2: 99% 100% 99% 98%    Intake/Output Summary (Last 24 hours) at 04/20/2021 1425 Last data filed at 04/20/2021 1300 Gross per 24 hour  Intake 1015 ml  Output 3100 ml  Net -2085 ml   There were no vitals filed for this visit.  Examination:   General: Not in pain or dyspnea, deconditioned  Neurology: Awake and alert, non focal  E ENT: no pallor, no icterus, oral mucosa moist Cardiovascular: No JVD. S1-S2 present, rhythmic, no gallops, rubs, or murmurs. No lower extremity edema. Pulmonary: positive breath sounds bilaterally, adequate air movement, no wheezing, rhonchi or rales. Gastrointestinal. Abdomen soft and non tender Skin. No rashes Musculoskeletal: no joint deformities     Data Reviewed: I have personally reviewed following labs and imaging studies  CBC: Recent Labs  Lab 04/14/21 0922 04/18/21 1607 04/18/21 2219 04/19/21 0438 04/19/21 2344 04/20/21 0513  WBC 6.7 14.8* 9.6 8.2  --  6.7  NEUTROABS  --  12.9*  --   --   --  4.8  HGB 9.8* 8.3* 7.8* 7.1* 9.1* 9.8*  HCT 32.2* 27.8* 26.2* 23.0* 28.6* 31.4*  MCV 75.4* 78.5* 78.4* 76.9*  --  80.3  PLT 446* 345 268 294  --  106   Basic Metabolic Panel: Recent Labs  Lab 04/18/21 1607 04/19/21 0438 04/20/21 0513  NA 133* 136 137  K 4.2 4.2 4.1  CL 106 110 111  CO2 22 21* 22  GLUCOSE 116* 95 87  BUN 37* 28* 18  CREATININE 1.60* 1.40*  1.40*  CALCIUM 8.5* 8.5* 8.6*  MG  --  1.8  --   PHOS  --  3.6  --    GFR: Estimated Creatinine Clearance: 44.1 mL/min (A) (by C-G formula based on SCr of 1.4 mg/dL (H)). Liver Function Tests: Recent Labs  Lab 04/18/21 1607 04/19/21 0438  AST 15 12*  ALT 11 9  ALKPHOS 77 67  BILITOT 0.5 0.5  PROT 5.5* 4.6*  ALBUMIN 2.9* 2.5*   No results for input(s): LIPASE, AMYLASE in the last 168 hours. No results for input(s): AMMONIA in the last 168 hours. Coagulation Profile: Recent Labs  Lab 04/14/21 0920 04/18/21 1607 04/19/21 0438 04/20/21 0513  INR 2.2 1.8* 1.9* 2.6*   Cardiac Enzymes: No results for input(s): CKTOTAL, CKMB, CKMBINDEX, TROPONINI in the last 168 hours. BNP (last 3 results) No results for input(s): PROBNP in the last 8760 hours. HbA1C: No results for input(s): HGBA1C in the last  72 hours. CBG: Recent Labs  Lab 04/18/21 1647  GLUCAP 84   Lipid Profile: No results for input(s): CHOL, HDL, LDLCALC, TRIG, CHOLHDL, LDLDIRECT in the last 72 hours. Thyroid Function Tests: No results for input(s): TSH, T4TOTAL, FREET4, T3FREE, THYROIDAB in the last 72 hours. Anemia Panel: No results for input(s): VITAMINB12, FOLATE, FERRITIN, TIBC, IRON, RETICCTPCT in the last 72 hours.    Radiology Studies: I have reviewed all of the imaging during this hospital visit personally     Scheduled Meds:  sodium chloride   Intravenous Once   amoxicillin-clavulanate  1 tablet Oral BID   loratadine  10 mg Oral Daily   LORazepam  0.5 mg Oral TID   multivitamin with minerals  1 tablet Oral Daily   pantoprazole  40 mg Oral Daily   pravastatin  40 mg Oral q1800   sertraline  100 mg Oral BID   thiamine  100 mg Oral Daily   traZODone  150 mg Oral QHS   warfarin  1 mg Oral ONCE-1600   Warfarin - Pharmacist Dosing Inpatient   Does not apply q1600   Continuous Infusions:   LOS: 1 day        Lesley Atkin Gerome Apley, MD

## 2021-04-20 NOTE — Progress Notes (Signed)
ANTICOAGULATION CONSULT NOTE  Pharmacy Consult:  Coumadin Indication:  Mechanical AVR  Allergies  Allergen Reactions   Aspirin     Other reaction(s): Other (See Comments) Can't take due to taking blood thinners   Morphine And Related Other (See Comments)    Other reaction(s): Other (See Comments) Hallucinations     Promethazine Other (See Comments)    Other reaction(s): Other (See Comments) Hallucinations     Patient Measurements: Weight = 75.9 kg Height 67 inches  Vital Signs: Temp: 98.3 F (36.8 C) (06/20 1215) Temp Source: Oral (06/20 1215) BP: 144/73 (06/20 1215) Pulse Rate: 93 (06/20 1215)  Labs: Recent Labs    04/18/21 1607 04/18/21 1834 04/18/21 2219 04/19/21 0438 04/19/21 2344 04/20/21 0513  HGB 8.3*  --  7.8* 7.1* 9.1* 9.8*  HCT 27.8*  --  26.2* 23.0* 28.6* 31.4*  PLT 345  --  268 294  --  252  APTT  --   --   --  31  --   --   LABPROT 20.6*  --   --  21.9*  --  27.8*  INR 1.8*  --   --  1.9*  --  2.6*  CREATININE 1.60*  --   --  1.40*  --  1.40*  TROPONINIHS 10 10  --   --   --   --     Estimated Creatinine Clearance: 44.1 mL/min (A) (by C-G formula based on SCr of 1.4 mg/dL (H)).    Assessment: 68 YOM with history of mechanical AVR on Coumadin 2.5mg  alternating with 3mg  every other day PTA.  Pharmacy consulted to continue Coumadin from PTA.  INR sub-therapeutic on admit at 1.8.  INR therapeutic today at 2.6.  No bleeding reported.   Anticipate INR trending up with drug-drug interaction with Flagyl.  Goal of Therapy:  INR 2.5-3.5 Monitor platelets by anticoagulation protocol: Yes   Plan:  Try Coumadin 1mg  PO today Daily PT / INR   Discussed with MD, change Rocephin/Flagyl to Augmentin d/t DDI between Flagyl and Coumadin  Vergil Burby D. Mina Marble, PharmD, BCPS, [place] 04/20/2021, 1:22 PM

## 2021-04-21 DIAGNOSIS — I10 Essential (primary) hypertension: Secondary | ICD-10-CM | POA: Diagnosis not present

## 2021-04-21 DIAGNOSIS — F1010 Alcohol abuse, uncomplicated: Secondary | ICD-10-CM | POA: Diagnosis not present

## 2021-04-21 DIAGNOSIS — K5732 Diverticulitis of large intestine without perforation or abscess without bleeding: Secondary | ICD-10-CM | POA: Diagnosis not present

## 2021-04-21 DIAGNOSIS — F411 Generalized anxiety disorder: Secondary | ICD-10-CM | POA: Diagnosis not present

## 2021-04-21 LAB — BASIC METABOLIC PANEL
Anion gap: 6 (ref 5–15)
BUN: 18 mg/dL (ref 8–23)
CO2: 22 mmol/L (ref 22–32)
Calcium: 8.7 mg/dL — ABNORMAL LOW (ref 8.9–10.3)
Chloride: 105 mmol/L (ref 98–111)
Creatinine, Ser: 1.39 mg/dL — ABNORMAL HIGH (ref 0.61–1.24)
GFR, Estimated: 53 mL/min — ABNORMAL LOW (ref >60)
Glucose, Bld: 104 mg/dL — ABNORMAL HIGH (ref 70–99)
Potassium: 3.8 mmol/L (ref 3.5–5.1)
Sodium: 133 mmol/L — ABNORMAL LOW (ref 135–145)

## 2021-04-21 LAB — CBC WITH DIFFERENTIAL/PLATELET
Abs Immature Granulocytes: 0.03 10*3/uL (ref 0.00–0.07)
Basophils Absolute: 0 10*3/uL (ref 0.0–0.1)
Basophils Relative: 1 %
Eosinophils Absolute: 0.4 10*3/uL (ref 0.0–0.5)
Eosinophils Relative: 6 %
HCT: 29.5 % — ABNORMAL LOW (ref 39.0–52.0)
Hemoglobin: 9.3 g/dL — ABNORMAL LOW (ref 13.0–17.0)
Immature Granulocytes: 0 %
Lymphocytes Relative: 13 %
Lymphs Abs: 1 10*3/uL (ref 0.7–4.0)
MCH: 25.3 pg — ABNORMAL LOW (ref 26.0–34.0)
MCHC: 31.5 g/dL (ref 30.0–36.0)
MCV: 80.4 fL (ref 80.0–100.0)
Monocytes Absolute: 0.7 10*3/uL (ref 0.1–1.0)
Monocytes Relative: 10 %
Neutro Abs: 5.2 10*3/uL (ref 1.7–7.7)
Neutrophils Relative %: 70 %
Platelets: 256 10*3/uL (ref 150–400)
RBC: 3.67 MIL/uL — ABNORMAL LOW (ref 4.22–5.81)
RDW: 20.1 % — ABNORMAL HIGH (ref 11.5–15.5)
WBC: 7.3 10*3/uL (ref 4.0–10.5)
nRBC: 0 % (ref 0.0–0.2)

## 2021-04-21 LAB — PROTIME-INR
INR: 2.6 — ABNORMAL HIGH (ref 0.8–1.2)
Prothrombin Time: 28.1 seconds — ABNORMAL HIGH (ref 11.4–15.2)

## 2021-04-21 MED ORDER — LORAZEPAM 0.5 MG PO TABS: 0.5000 mg | ORAL_TABLET | ORAL | Status: DC

## 2021-04-21 MED ORDER — RAMIPRIL 10 MG PO CAPS: 10.0000 mg | ORAL_CAPSULE | Freq: Every day | ORAL | 1 refills | Status: DC

## 2021-04-21 MED ORDER — AMLODIPINE BESYLATE 5 MG PO TABS: 5.0000 mg | ORAL_TABLET | Freq: Every day | ORAL | 1 refills | Status: DC

## 2021-04-21 MED ORDER — FUROSEMIDE 20 MG PO TABS: 20.0000 mg | ORAL_TABLET | Freq: Every day | ORAL | Status: AC | PRN

## 2021-04-21 MED ORDER — WARFARIN SODIUM 2 MG PO TABS: 2.0000 mg | ORAL_TABLET | ORAL | Status: DC

## 2021-04-21 MED ORDER — WARFARIN SODIUM 1 MG PO TABS: 0.5000 mg | ORAL_TABLET | ORAL | Status: DC

## 2021-04-21 MED ORDER — WARFARIN SODIUM 3 MG PO TABS
3.0000 mg | ORAL_TABLET | Freq: Once | ORAL | Status: DC
  Filled 2021-04-21: qty 1

## 2021-04-21 NOTE — Progress Notes (Signed)
ANTICOAGULATION CONSULT NOTE  Pharmacy Consult:  Coumadin Indication:  Mechanical AVR  Allergies  Allergen Reactions   Aspirin     Other reaction(s): Other (See Comments) Can't take due to taking blood thinners   Morphine And Related Other (See Comments)    Other reaction(s): Other (See Comments) Hallucinations     Promethazine Other (See Comments)    Other reaction(s): Other (See Comments) Hallucinations     Patient Measurements: Weight = 75.9 kg Height 67 inches  Vital Signs: Temp: 98.2 F (36.8 C) (06/21 0455) Temp Source: Oral (06/21 0455) BP: 113/56 (06/21 0455) Pulse Rate: 74 (06/21 0455)  Labs: Recent Labs    04/18/21 1607 04/18/21 1834 04/18/21 2219 04/19/21 0438 04/19/21 2344 04/20/21 0513 04/21/21 0213  HGB 8.3*  --    < > 7.1* 9.1* 9.8* 9.3*  HCT 27.8*  --    < > 23.0* 28.6* 31.4* 29.5*  PLT 345  --    < > 294  --  252 256  APTT  --   --   --  31  --   --   --   LABPROT 20.6*  --   --  21.9*  --  27.8* 28.1*  INR 1.8*  --   --  1.9*  --  2.6* 2.6*  CREATININE 1.60*  --   --  1.40*  --  1.40* 1.39*  TROPONINIHS 10 10  --   --   --   --   --    < > = values in this interval not displayed.     Estimated Creatinine Clearance: 44.4 mL/min (A) (by C-G formula based on SCr of 1.39 mg/dL (H)).    Assessment: 39 YOM with history of mechanical AVR on Coumadin 2.5mg  alternating with 3mg  every other day PTA.  Pharmacy consulted to continue Coumadin from PTA.  INR sub-therapeutic on admit at 1.8.  INR remains therapeutic at 2.6 today.  No bleeding reported.    Goal of Therapy:  INR 2.5-3.5 Monitor platelets by anticoagulation protocol: Yes   Plan:  Coumadin 3mg  PO today Daily PT / INR  Dajah Fischman D. Mina Marble, PharmD, BCPS, [place] 04/21/2021, 9:53 AM

## 2021-04-21 NOTE — Progress Notes (Signed)
Patient has ordered for discharge. Given discharge instructions with paper to the patient and family. IV removed. Given all belongings to the patient and family.

## 2021-04-21 NOTE — Discharge Summary (Signed)
Physician Discharge Summary  Garrett Terry ION:[id number] DOB: 1946/04/04 DOA: 04/18/2021  PCP: [name], NP  Admit date: 04/18/2021 Discharge date: 04/21/2021  Admitted From: Home  Disposition:  Home   Recommendations for Outpatient Follow-up and new medication changes:  Follow up with Sherrie Mustache NP in 7 to 10 days.  Follow up INR next week.  Decrease ramipril to 10 mg daily to prevent hypotension.  Resume amlodipine when blood pressure systolic 244 mmHg or greater.   Home Health: no   Equipment/Devices: no    Discharge Condition: stable  CODE STATUS: full  Diet recommendation:  heart healthy   Brief/Interim Summary: [name] was admitted with the working diagnosis of sigmoid diverticulitis complicated with lower GI bleed and acute blood loss anemia.    75 year old male with a past medical history for mechanical aortic valve replacement, alcohol abuse, generalized anxiety disorder, esophagitis, hypertension, dyslipidemia and migraines who presented with nausea, vomiting, headaches and syncope. Acute onset of symptoms on day of hospitalization, patient had severe weakness and generalized malaise that evolved into abdominal pain, nausea and vomiting.  He had a brief syncope episode.  When EMS arrived his systolic blood pressure was down to 72 mmHg.  After initial IV fluids his blood pressure was 102/69, heart rate 74, respiratory rate 20, oxygen saturation 100%, his lungs were clear to auscultation bilaterally, heart S1-S2, present, rhythmic, soft abdomen, tender to palpation in periumbilical region, present bowel sounds, no lower extremity edema.   Sodium 133, potassium 4.2, chloride 106, bicarb 22, glucose 116, BUN 37, creatinine 1.60, white count 14.8, hemoglobin 8.3, hematocrit 27.8, platelets 345.  INR 1.8. SARS COVID-19 negative. Urinalysis specific gravity 1.018, 0-5 white cells.   Head CT with small areas of hypoattenuation in the deep white matter of the  frontal lobes, left basal ganglia and left thalamus may represent age-indeterminate lacunar infarcts or asymmetric white matter microangiopathy.  No acute intracranial changes.   Chest radiograph no infiltrates.   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  No dissection.   EKG 86 bpm, normal axis normal intervals, first-degree AV block, sinus rhythm, no ST segment or T wave changes, positive LVH.  Patient was placed on IV fluids and antibiotic therapy with good toleration. Noted worsening anemia and received PRBC transfusion.  Patient symptoms improved and he will follow up as outpatient with GI for colonoscopy.   Mild sigmoid diverticulitis, complicated with acute blood loss anemia, acute lower GI bleed. Iron deficiency anemia Patient was admitted to the medical ward, he was placed on the remote telemetry monitor.  Received intravenous fluids and intravenous antibiotic therapy. No frank melena or hematochezia, his hemoglobin dropped to 7.1 with hematocrit 23.0, he received 2 units packed red blood cells with good toleration.  His discharge hemoglobin is 9.3, hematocrit 39.5, he has remained afebrile with soft abdomen, and tolerating p.o. diet adequately.  His discharge white cell count is 7.3.  Considering mild diverticulitis, drug drug interaction with Coumadin-antibiotic, patient will be discharged without further antibiotic therapy. Continue with oral iron supplementation.   2.  Status post aortic valve replacement, (mechanical valve), vasovagal syncope.  Patient's hypotension likely volume related, it improved with intravenous fluids.   Further work-up with echocardiography showed a preserved LV systolic function, ejection fraction 60 to 65%, no aortic valve dysfunction.  He tolerated well PRBC transfusion. Continue warfarin for anticoagulation, discharge  INR is 2.6.  3.  Hypertension/dyslipidemia.  Initially his antihypertensive agents were held.  Before discharge she was placed on 10 mg lisinopril with good toleration. Instructions to resume amlodipine when systolic blood pressure 161 mmHg or greater.  4.  Anxiety, alcohol abuse disorder.  No signs of acute withdrawal, continue trazodone, sertraline and buspirone. He received as needed lorazepam during his hospitalization.  5.  Acute kidney injury chronic kidney disease stage IIIa/hyponatremia.  With supportive medical therapy his kidney function stabilized, at discharge sodium 133, potassium 3.8, chloride 105, bicarb 22, glucose 104, BUN 19, creatinine 0.39.  Patient tolerating p.o. diet adequately.    Discharge Diagnoses:  Principal Problem:   Diverticulitis of sigmoid colon Active Problems:   Mixed hyperlipidemia   Hx of mechanical aortic valve replacement   Syncope, vasovagal   Nausea and vomiting   [place] grade B esophagitis   Migraine headache with aura   Alcohol abuse   Generalized anxiety disorder   Essential hypertension   GI bleeding    Discharge Instructions   Allergies as of 04/21/2021       Reactions   Aspirin    Other reaction(s): Other (See Comments) Can't take due to taking blood thinners   Morphine And Related Other (See Comments)   Other reaction(s): Other (See Comments) Hallucinations   Promethazine Other (See Comments)   Other reaction(s): Other (See Comments) Hallucinations        Medication List     STOP taking these medications    busPIRone 5 MG tablet Commonly known as: BUSPAR       TAKE these medications    acetaminophen 500 MG tablet Commonly known as: TYLENOL Take 500 mg by mouth daily as needed for moderate pain.   albuterol 108 (90 Base) MCG/ACT inhaler Commonly known as: VENTOLIN HFA Inhale 1 puff into the lungs every 6 (six) hours as needed for wheezing or shortness of breath.   amLODipine 5 MG tablet Commonly  known as: NORVASC Take 1 tablet (5 mg total) by mouth daily. Start taking when blood pressure upper number 140 or grater. What changed: additional instructions   folic acid 1 MG tablet Commonly known as: FOLVITE Take 1 tablet (1 mg total) by mouth daily.   furosemide 20 MG tablet Commonly known as: LASIX Take 1 tablet (20 mg total) by mouth daily as needed for fluid or edema (3lb weight gain).   Iron (Ferrous Sulfate) 325 (65 Fe) MG Tabs Take 325 mg by mouth in the morning and at bedtime.   LORazepam 0.5 MG tablet Commonly known as: ATIVAN Take 1 tablet (0.5 mg total) by mouth See admin instructions. 0.5mg  in the morning 1mg  in the evening   lovastatin 40 MG tablet Commonly known as: MEVACOR Take 40 mg by mouth at bedtime.   pantoprazole 40 MG tablet Commonly known as: PROTONIX Take 1 tablet (40 mg total) by mouth 2 (two) times daily.   ramipril 10 MG capsule Commonly known as: ALTACE Take 1 capsule (10 mg total) by mouth daily. What changed: when to take this   sertraline 100 MG tablet Commonly known as: ZOLOFT Take 1 tablet (100 mg total) by mouth 2 (two) times daily.   thiamine 100 MG tablet Take 100 mg by mouth daily.   warfarin 2 MG tablet Commonly known as: COUMADIN Take 1 tablet (2 mg total) by mouth See admin instructions. Take 2.5mg  daily alternating with 3mg  daily. 2mg  tablet and 0.5mg  alternating with 2mg  tablet and 1mg  tablet. What changed:  when to  take this additional instructions Another medication with the same name was removed. Continue taking this medication, and follow the directions you see here.        Allergies  Allergen Reactions   Aspirin     Other reaction(s): Other (See Comments) Can't take due to taking blood thinners   Morphine And Related Other (See Comments)    Other reaction(s): Other (See Comments) Hallucinations     Promethazine Other (See Comments)    Other reaction(s): Other (See Comments) Hallucinations        Procedures/Studies: CT ABDOMEN PELVIS WO CONTRAST  Result Date: 03/27/2021 CLINICAL DATA:  Low hemoglobin, hematocrit, history of blood transfusions, fatigue and nausea vomiting EXAM: CT ABDOMEN AND PELVIS WITHOUT CONTRAST TECHNIQUE: Multidetector CT imaging of the abdomen and pelvis was performed following the standard protocol without IV contrast. COMPARISON:  None. FINDINGS: Lower chest: Few clustered tree-in-bud opacities are seen in the right lower lobe (4/32) and right middle lobe (4/1). Calcified granuloma in the left lung base (4/19). Top-normal cardiac size. Calcifications of the mitral annulus and chordae tendinae. Hypoattenuation of the cardiac blood pool may reflect a relative anemia compatible patient's low hemoglobin/hematocrit. Atherosclerotic calcifications of the coronary arteries. Hepatobiliary: Punctate calcifications noted within the hepatic parenchyma. Smooth surface contour. No concerning focal liver lesion. Likely physiologic distension of the gallbladder. No pericholecystic fluid or inflammation. No visible calcified gallstones or biliary ductal dilatation. Pancreas: No pancreatic ductal dilatation or surrounding inflammatory changes. Spleen: Punctate calcifications throughout the spleen. Splenic size is within normal limits. No concerning focal splenic lesion. Adrenals/Urinary Tract: Normal adrenals. Kidneys are symmetric in size and normally located. Tiny subcentimeter 5 mm rounded subcapsular focus on the anterior interpolar left kidney (2/33) too small to fully characterize though statistically likely benign. No obstructive urolithiasis or hydronephrosis. Mild bilateral perinephric stranding is nonspecific particularly given some mild bladder wall thickening. Keyhole defect at the level of the bladder base may reflect prior TURP or other postsurgical change. Stomach/Bowel: Moderate hiatal hernia despite postsurgical changes at the diaphragmatic hiatus likely reflecting prior  hernia surgery. Surgical clips noted towards the gastric antrum and upper midline abdomen as well. Stomach and duodenum are otherwise unremarkable. No small bowel thickening or dilatation accounting for underdistention. Appendix is not visualized. No focal inflammation the vicinity of the cecum to suggest an occult appendicitis. Diffuse pancolonic and terminal ileal diverticulosis. Focally thickened segment of sigmoid colon is noted without significant colonic stranding (5/74). In a region of numerous diverticula. No evidence of bowel obstruction. Vascular/Lymphatic: Atherosclerotic calcifications within the abdominal aorta and branch vessels. No aneurysm or ectasia. No enlarged abdominopelvic lymph nodes. Reproductive: Keyhole defect at the bladder base/prostate, correlate with postsurgical change. Prostate seminal vesicles are otherwise. Other: Fat containing bilateral inguinal hernias, left greater than right. No frank bowel containing hernia though the left inguinal hernia is closely apposed by proximal sigmoid. No free intraperitoneal air or fluid. Postsurgical changes from prior vertical midline incision. Musculoskeletal: Remote appearing compression deformities T12 (50% height loss) and L1 (20% height loss). Multilevel degenerative changes are present in the imaged portions of the spine. Additional degenerative changes in the hips and pelvis. IMPRESSION: 1. Focally thickened segment of sigmoid colon in a region of several diverticula but without acute inflammation. Could reflect sequela of prior infection/inflammation though warrants further evaluation with direct visualization is underlying mass lesion is not fully excluded. 2. Additional diverticulosis throughout the terminal ileum and colon. 3. Few clustered tree-in-bud opacities are present in the right middle and lower lobe, could  reflect acute infection or inflammation including sequela of aspiration in the appropriate clinical setting. 4. Mild  bilateral perinephric stranding and some mild bladder wall thickening, recommend correlation with urinalysis to exclude ascending tract infection. 5. Remote appearing compression deformities of T12, L1, though could correlate for local tenderness to exclude acuity. 6. Hypoattenuating cardiac blood pool compatible with anemia. 7. Evidence of prior granulomatous disease in the chest and abdomen. 8. Keyhole defect at the level of the bladder base/prostate, correlate with prior surgical history. 9. Moderate hiatal hernia despite surgical changes near the diaphragmatic hiatus. 10.  Aortic Atherosclerosis (ICD10-I70.0). Electronically Signed   By: Lovena Le M.D.   On: 03/27/2021 18:26   CT Head Wo Contrast  Result Date: 04/18/2021 CLINICAL DATA:  Headache. EXAM: CT HEAD WITHOUT CONTRAST TECHNIQUE: Contiguous axial images were obtained from the base of the skull through the vertex without intravenous contrast. COMPARISON:  None. FINDINGS: Brain: No evidence of acute hemorrhage, hydrocephalus, extra-axial collection or mass lesion/mass effect. Small areas of hypoattenuation in the deep white matter of the frontal lobes, left basal ganglion left thalamus may represent age-indeterminate lacunar infarcts or asymmetric white matter microangiopathy. Vascular: Calcific atherosclerotic disease of the intra cavernous carotid arteries. Skull: Normal. Negative for fracture or focal lesion. Sinuses/Orbits: No acute finding. Other: None. IMPRESSION: Small areas of hypoattenuation in the deep white matter of the frontal lobes, left basal ganglia and left thalamus may represent age-indeterminate lacunar infarcts or asymmetric white matter microangiopathy. No evidence of intracranial hemorrhage. Electronically Signed   By: Fidela Salisbury M.D.   On: 04/18/2021 16:25   MR BRAIN WO CONTRAST  Result Date: 04/19/2021 CLINICAL DATA:  Headache EXAM: MRI HEAD WITHOUT CONTRAST TECHNIQUE: Multiplanar, multiecho pulse sequences of  the brain and surrounding structures were obtained without intravenous contrast. COMPARISON:  None. FINDINGS: Brain: No acute infarct, mass effect or extra-axial collection. Foci of chronic microhemorrhage in the right frontal and left parietal lobes. There is multifocal hyperintense T2-weighted signal within the white matter. Generalized volume loss without a clear lobar predilection. 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 The midline structures are normal. Vascular: Major flow voids are preserved. Skull and upper cervical spine: Normal calvarium and skull base. Visualized upper cervical spine and soft tissues are normal. Sinuses/Orbits:No paranasal sinus fluid levels or advanced mucosal thickening. No mastoid or middle ear effusion. Normal orbits. IMPRESSION: 1. No acute intracranial abnormality. 2. 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 Electronically Signed   By: Ulyses Jarred M.D.   On: 04/19/2021 01:49   US Abdomen Complete  Result Date: 03/27/2021 CLINICAL DATA:  Anemia, gastrointestinal bleeding EXAM: ABDOMEN ULTRASOUND COMPLETE COMPARISON:  03/27/2021 FINDINGS: Gallbladder: No gallstones or wall thickening visualized. No sonographic Murphy sign noted by sonographer. Common bile duct: Diameter: 5 mm Liver: No focal lesion identified. Within normal limits in parenchymal echogenicity. Portal vein is patent on color Doppler imaging with normal direction of blood flow towards the liver. IVC: Not well visualized. Pancreas: Visualized portions of the head and body are unremarkable. Tail is obscured by bowel gas. Spleen: Grossly normal in size. 2.4 x 1.9 x 2.1 cm hyperechoic focus within the inferior aspect of the splenic hilum could reflect a small splenic hemangioma. No other focal abnormalities. Right Kidney: Length: 10.9 cm. Echogenicity within normal limits. 17 mm peripelvic cyst. No hydronephrosis or nephrolithiasis. Left Kidney: Length: 10.3 cm. Normal  echogenicity. 9 mm cortical cyst midpole. No hydronephrosis or nephrolithiasis. Abdominal aorta: No aneurysm visualized. Other findings: None. IMPRESSION:  1. Small bilateral renal cysts. 2. Indeterminate 2.4 cm hyperechoic focus inferior splenic hilum, favor hemangioma. 3. Limited visualization of the IVC and pancreas due to bowel gas. Electronically Signed   By: Randa Ngo M.D.   On: 03/27/2021 21:14   DG Chest Portable 1 View  Result Date: 04/18/2021 CLINICAL DATA:  Syncope EXAM: PORTABLE CHEST 1 VIEW COMPARISON:  None. FINDINGS: Prior median sternotomy and cardiac valve replacement. Heart size is upper limits of normal. No focal airspace consolidation, pleural effusion, or pneumothorax. Advanced degenerative changes of the right shoulder. IMPRESSION: No active disease. Electronically Signed   By: Davina Poke D.O.   On: 04/18/2021 16:32   ECHOCARDIOGRAM COMPLETE  Result Date: 04/19/2021    ECHOCARDIOGRAM REPORT   Patient Name:   Garrett Terry [facility] Date of Exam: 04/19/2021 Medical Rec #:  [id number]         Height:       67.5 in Accession #:    [id number]        Weight:       167.4 lb Date of Birth:  1946/01/15         BSA:          1.885 m Patient Age:    75 years          BP:           94/56 mmHg Patient Gender: M                 HR:           83 bpm. Exam Location:  Inpatient Procedure: 2D Echo Indications:    syncope  History:        Patient has prior history of Echocardiogram examinations. Risk                 Factors:Dyslipidemia.                 Aortic Valve: mechanical valve is present in the aortic                 position.  Sonographer:    Johny Chess Referring Phys: [id number] [place]  1. Left ventricular ejection fraction, by estimation, is 60 to 65%. The left ventricle has normal function. The left ventricle has no regional wall motion abnormalities. There is mild left ventricular hypertrophy. Left ventricular diastolic parameters are consistent with Grade I  diastolic dysfunction (impaired relaxation).  2. Right ventricular systolic function is normal. The right ventricular size is normal. There is normal pulmonary artery systolic pressure.  3. The mitral valve is abnormal. Trivial mitral valve regurgitation.  4. The aortic valve has been repaired/replaced. Aortic valve regurgitation is trivial. There is a mechanical valve present in the aortic position. Echo findings are consistent with normal structure and function of the aortic valve prosthesis. Aortic valve mean gradient measures 14.0 mmHg. Normal occluder movement - no evidence of obstruction. DI is 0.44.  5. Aortic dilatation noted. There is mild dilatation of the ascending aorta, measuring 40 mm.  6. The inferior vena cava is normal in size with greater than 50% respiratory variability, suggesting right atrial pressure of 3 mmHg. Comparison(s): No prior Echocardiogram. FINDINGS  Left Ventricle: Left ventricular ejection fraction, by estimation, is 60 to 65%. The left ventricle has normal function. The left ventricle has no regional wall motion abnormalities. The left ventricular internal cavity size was normal in size. There is  mild left ventricular hypertrophy. Left ventricular diastolic parameters are consistent  with Grade I diastolic dysfunction (impaired relaxation). Indeterminate filling pressures. Right Ventricle: The right ventricular size is normal. No increase in right ventricular wall thickness. Right ventricular systolic function is normal. There is normal pulmonary artery systolic pressure. The tricuspid regurgitant velocity is 2.42 m/s, and  with an assumed right atrial pressure of 3 mmHg, the estimated right ventricular systolic pressure is 48.5 mmHg. Left Atrium: Left atrial size was normal in size. Right Atrium: Right atrial size was normal in size. Pericardium: There is no evidence of pericardial effusion. Mitral Valve: The mitral valve is abnormal. There is moderate calcification of the  anterior mitral valve leaflet(s). Mild mitral annular calcification. Trivial mitral valve regurgitation. Tricuspid Valve: The tricuspid valve is grossly normal. Tricuspid valve regurgitation is trivial. Aortic Valve: The aortic valve has been repaired/replaced. Aortic valve regurgitation is trivial. Aortic valve mean gradient measures 14.0 mmHg. Aortic valve peak gradient measures 24.9 mmHg. Aortic valve area, by VTI measures 2.54 cm. There is a mechanical valve present in the aortic position. Echo findings are consistent with normal structure and function of the aortic valve prosthesis. Pulmonic Valve: The pulmonic valve was grossly normal. Pulmonic valve regurgitation is trivial. Aorta: Aortic dilatation noted. There is mild dilatation of the ascending aorta, measuring 40 mm. Venous: The inferior vena cava is normal in size with greater than 50% respiratory variability, suggesting right atrial pressure of 3 mmHg. IAS/Shunts: No atrial level shunt detected by color flow Doppler.  LEFT VENTRICLE PLAX 2D LVIDd:         5.60 cm  Diastology LVIDs:         3.60 cm  LV e' medial:    8.16 cm/s LV PW:         1.20 cm  LV E/e' medial:  10.2 LV IVS:        1.10 cm  LV e' lateral:   9.46 cm/s LVOT diam:     2.70 cm  LV E/e' lateral: 8.8 LV SV:         110 LV SV Index:   58 LVOT Area:     5.73 cm  RIGHT VENTRICLE             IVC RV S prime:     12.50 cm/s  IVC diam: 1.50 cm TAPSE (M-mode): 2.1 cm LEFT ATRIUM             Index       RIGHT ATRIUM           Index LA diam:        4.20 cm 2.23 cm/m  RA Area:     17.60 cm LA Vol (A2C):   48.5 ml 25.72 ml/m RA Volume:   44.70 ml  23.71 ml/m LA Vol (A4C):   47.2 ml 25.04 ml/m LA Biplane Vol: 52.3 ml 27.74 ml/m  AORTIC VALVE AV Area (Vmax):    2.40 cm AV Area (Vmean):   2.35 cm AV Area (VTI):     2.54 cm AV Vmax:           249.50 cm/s AV Vmean:          172.000 cm/s AV VTI:            0.432 m AV Peak Grad:      24.9 mmHg AV Mean Grad:      14.0 mmHg LVOT Vmax:         104.50  cm/s LVOT Vmean:        70.700 cm/s LVOT  VTI:          0.192 m LVOT/AV VTI ratio: 0.44  AORTA Ao Asc diam: 4.00 cm MITRAL VALVE                TRICUSPID VALVE MV Area (PHT): 4.06 cm     TR Peak grad:   23.4 mmHg MV Decel Time: 187 msec     TR Vmax:        242.00 cm/s MV E velocity: 83.20 cm/s MV A velocity: 127.00 cm/s  SHUNTS MV E/A ratio:  0.66         Systemic VTI:  0.19 m                             Systemic Diam: 2.70 cm Lyman Bishop MD Electronically signed by Lyman Bishop MD Signature Date/Time: 04/19/2021/12:36:23 PM    Final    CT Angio Chest/Abd/Pel for Dissection W and/or Wo Contrast  Result Date: 04/18/2021 CLINICAL DATA:  Chest and abdominal pain EXAM: CT ANGIOGRAPHY CHEST, ABDOMEN AND PELVIS TECHNIQUE: Non-contrast CT of the chest was initially obtained. Multidetector CT imaging through the chest, abdomen and pelvis was performed using the standard protocol during bolus administration of intravenous contrast. Multiplanar reconstructed images and MIPs were obtained and reviewed to evaluate the vascular anatomy. CONTRAST:  150mL OMNIPAQUE IOHEXOL 350 MG/ML SOLN COMPARISON:  Chest x-ray from earlier in the same day, CT of the abdomen and pelvis from 03/27/2021. FINDINGS: CTA CHEST FINDINGS Cardiovascular: Initial precontrast images were obtained and reveal atherosclerotic calcification. No aneurysmal dilatation is noted. Prior aortic valve replacement is seen. Post-contrast images demonstrate atherosclerotic calcification of the thoracic aorta. No dissection is noted. The ascending aorta is prominent to 4 cm. Coronary calcifications are noted. The pulmonary artery as visualized is within normal limits. Mediastinum/Nodes: Thoracic inlet is within normal limits. No sizable hilar or mediastinal adenopathy is noted. The esophagus as visualized is within normal limits with the exception of a small sliding-type hiatal hernia. Some calcified mediastinal nodes are seen consistent with prior granulomatous  disease. Lungs/Pleura: Lungs are well aerated bilaterally. A few scattered calcified granulomas are seen. No sizable effusion is noted. No focal infiltrate is seen. Musculoskeletal: Mild degenerative changes of the thoracic spine are noted. Multiple old healed rib fractures are noted on the right. Chronic appearing T12 compression deformity is noted. Review of the MIP images confirms the above findings. CTA ABDOMEN AND PELVIS FINDINGS VASCULAR Aorta: Atherosclerotic calcifications are noted without aneurysmal dilatation or dissection. Celiac: Mild atherosclerotic changes are noted without focal significant stenosis. SMA: Mild atherosclerotic calcifications are noted without focal significant stenosis. Renals: Both renal arteries are patent without evidence of aneurysm, dissection, vasculitis, fibromuscular dysplasia or significant stenosis. IMA: Patent without evidence of aneurysm, dissection, vasculitis or significant stenosis. Iliacs: Atherosclerotic calcifications are noted without aneurysmal dilatation or dissection. Veins: No specific venous abnormality is noted. Review of the MIP images confirms the above findings. NON-VASCULAR Hepatobiliary: No focal liver abnormality is seen. No gallstones, gallbladder wall thickening, or biliary dilatation. Pancreas: Unremarkable. No pancreatic ductal dilatation or surrounding inflammatory changes. Spleen: Spleen demonstrates scattered calcified granulomas. No focal abnormality is noted. Adrenals/Urinary Tract: Adrenal glands are within normal limits. Kidneys demonstrate a normal enhancement pattern bilaterally. No renal calculi or obstructive changes are seen. The bladder is well distended. Stomach/Bowel: Diverticular change of the colon is noted. Very mild inflammatory changes are seen within the descending colon as well as the junction of the descending  and ascending colons. No evidence of perforation or abscess formation is seen. Appendix is not well visualized although  no inflammatory changes are seen to suggest appendicitis. Small bowel and stomach are within normal limits. Lymphatic: No significant lymphadenopathy is identified. Reproductive: Prostate is unremarkable. Other: No abdominal wall hernia or abnormality. No abdominopelvic ascites. Musculoskeletal: Chronic L1 compression deformity is noted of lesser degree than that seen at T12. This is also stable from the prior exam. Review of the MIP images confirms the above findings. IMPRESSION: Changes of mild diverticulitis in the descending and sigmoid colon. No perforation or abscess formation is seen. 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 No evidence of dissection is noted within the thoracic or abdominal aorta. Recommend annual imaging followup by CTA or MRA. This recommendation follows 2010 ACCF/AHA/AATS/ACR/ASA/SCA/SCAI/SIR/STS/SVM Guidelines for the Diagnosis and Management of Patients with Thoracic Aortic Disease. Circulation. 2010; 121: G956-O130. Aortic aneurysm NOS (ICD10-I71.9) Hiatal hernia. Changes of prior granulomatous disease. Chronic T12 and L1 compression deformities. Aortic Atherosclerosis (ICD10-I70.0). Electronically Signed   By: Inez Catalina M.D.   On: 04/18/2021 18:35      Subjective: Patient is feeling better, no nausea or vomiting, no chest pain or dyspnea.   Discharge Exam: Vitals:   04/20/21 2309 04/21/21 0455  BP: 101/64 (!) 113/56  Pulse: 87 74  Resp: 19 17  Temp: 99.6 F (37.6 C) 98.2 F (36.8 C)  SpO2: 97% 94%   Vitals:   04/20/21 1822 04/20/21 2237 04/20/21 2309 04/21/21 0455  BP: (!) 116/59  101/64 (!) 113/56  Pulse: 91  87 74  Resp: 16  19 17   Temp: 98.9 F (37.2 C)  99.6 F (37.6 C) 98.2 F (36.8 C)  TempSrc: Oral  Oral Oral  SpO2: 96%  97% 94%  Weight:  75.9 kg    Height:  5' 7.5" (1.715 m)      General: Not in pain or dyspnea .  Neurology: Awake and alert, non focal  E ENT: mild pallor, no icterus, oral mucosa moist Cardiovascular: No JVD. S1-S2  present, rhythmic, no gallops, rubs, or murmurs. No lower extremity edema. Pulmonary: positive breath sounds bilaterally, adequate air movement, no wheezing, rhonchi or rales. Gastrointestinal. Abdomen soft and non tender Skin. No rashes Musculoskeletal: no joint deformities   The results of significant diagnostics from this hospitalization (including imaging, microbiology, ancillary and laboratory) are listed below for reference.     Microbiology: Recent Results (from the past 240 hour(s))  Resp Panel by RT-PCR (Flu A&B, Covid) Nasopharyngeal Swab     Status: None   Collection Time: 04/18/21  4:44 PM   Specimen: Nasopharyngeal Swab; Nasopharyngeal(NP) swabs in vial transport medium  Result Value Ref Range Status   SARS Coronavirus 2 by RT PCR NEGATIVE NEGATIVE Final    Comment: (NOTE) SARS-CoV-2 target nucleic acids are NOT DETECTED.  The SARS-CoV-2 RNA is generally detectable in upper respiratory specimens during the acute phase of infection. The lowest concentration of SARS-CoV-2 viral copies this assay can detect is 138 copies/mL. A negative result does not preclude SARS-Cov-2 infection and should not be used as the sole basis for treatment or other patient management decisions. A negative result may occur with  improper specimen collection/handling, submission of specimen other than nasopharyngeal swab, presence of viral mutation(s) within the areas targeted by this assay, and inadequate number of viral copies(<138 copies/mL). A negative result must be combined with clinical observations, patient history, and epidemiological information. The expected result is Negative.  Fact Sheet  for Patients:  EntrepreneurPulse.com.au  Fact Sheet for Healthcare Providers:  IncredibleEmployment.be  This test is no t yet approved or cleared by the [place] FDA and  has been authorized for detection and/or diagnosis of SARS-CoV-2 by FDA under an  Emergency Use Authorization (EUA). This EUA will remain  in effect (meaning this test can be used) for the duration of the COVID-19 declaration under Section 564(b)(1) of the Act, 21 U.S.C.section 360bbb-3(b)(1), unless the authorization is terminated  or revoked sooner.       Influenza A by PCR NEGATIVE NEGATIVE Final   Influenza B by PCR NEGATIVE NEGATIVE Final    Comment: (NOTE) The Xpert Xpress SARS-CoV-2/FLU/RSV plus assay is intended as an aid in the diagnosis of influenza from Nasopharyngeal swab specimens and should not be used as a sole basis for treatment. Nasal washings and aspirates are unacceptable for Xpert Xpress SARS-CoV-2/FLU/RSV testing.  Fact Sheet for Patients: EntrepreneurPulse.com.au  Fact Sheet for Healthcare Providers: IncredibleEmployment.be  This test is not yet approved or cleared by the [place] FDA and has been authorized for detection and/or diagnosis of SARS-CoV-2 by FDA under an Emergency Use Authorization (EUA). This EUA will remain in effect (meaning this test can be used) for the duration of the COVID-19 declaration under Section 564(b)(1) of the Act, 21 U.S.C. section 360bbb-3(b)(1), unless the authorization is terminated or revoked.  Performed at [facility], [place] [address], [place], [place] [address]   Culture, blood (routine x 2)     Status: None (Preliminary result)   Collection Time: 04/18/21 10:19 PM   Specimen: BLOOD  Result Value Ref Range Status   Specimen Description BLOOD LEFT ARM  Final   Special Requests   Final    BOTTLES DRAWN AEROBIC ONLY Blood Culture results may not be optimal due to an inadequate volume of blood received in culture bottles   Culture   Final    NO GROWTH 3 DAYS Performed at [facility], [place] [address] [place], [place] [address]    Report Status PENDING  Incomplete  Culture, blood (routine x 2)     Status: None (Preliminary result)   Collection  Time: 04/18/21 10:31 PM   Specimen: BLOOD  Result Value Ref Range Status   Specimen Description BLOOD RIGHT HAND  Final   Special Requests   Final    BOTTLES DRAWN AEROBIC ONLY Blood Culture results may not be optimal due to an inadequate volume of blood received in culture bottles   Culture   Final    NO GROWTH 3 DAYS Performed at [facility], [place] [address] [place],  [address]    Report Status PENDING  Incomplete     Labs: BNP (last 3 results) No results for input(s): BNP in the last 8760 hours. Basic Metabolic Panel: Recent Labs  Lab 04/18/21 1607 04/19/21 0438 04/20/21 0513 04/21/21 0213  NA 133* 136 137 133*  K 4.2 4.2 4.1 3.8  CL 106 110 111 105  CO2 22 21* 22 22  GLUCOSE 116* 95 87 104*  BUN 37* 28* 18 18  CREATININE 1.60* 1.40* 1.40* 1.39*  CALCIUM 8.5* 8.5* 8.6* 8.7*  MG  --  1.8  --   --   PHOS  --  3.6  --   --    Liver Function Tests: Recent Labs  Lab 04/18/21 1607 04/19/21 0438  AST 15 12*  ALT 11 9  ALKPHOS 77 67  BILITOT 0.5 0.5  PROT  5.5* 4.6*  ALBUMIN 2.9* 2.5*   No results for input(s): LIPASE, AMYLASE in the last 168 hours. No results for input(s): AMMONIA in the last 168 hours. CBC: Recent Labs  Lab 04/18/21 1607 04/18/21 2219 04/19/21 0438 04/19/21 2344 04/20/21 0513 04/21/21 0213  WBC 14.8* 9.6 8.2  --  6.7 7.3  NEUTROABS 12.9*  --   --   --  4.8 5.2  HGB 8.3* 7.8* 7.1* 9.1* 9.8* 9.3*  HCT 27.8* 26.2* 23.0* 28.6* 31.4* 29.5*  MCV 78.5* 78.4* 76.9*  --  80.3 80.4  PLT 345 268 294  --  252 256   Cardiac Enzymes: No results for input(s): CKTOTAL, CKMB, CKMBINDEX, TROPONINI in the last 168 hours. BNP: Invalid input(s): POCBNP CBG: Recent Labs  Lab 04/18/21 1647  GLUCAP 84   D-Dimer No results for input(s): DDIMER in the last 72 hours. Hgb A1c No results for input(s): HGBA1C in the last 72 hours. Lipid Profile No results for input(s): CHOL, HDL, LDLCALC, TRIG, CHOLHDL, LDLDIRECT in the last 72  hours. Thyroid function studies No results for input(s): TSH, T4TOTAL, T3FREE, THYROIDAB in the last 72 hours.  Invalid input(s): FREET3 Anemia work up No results for input(s): VITAMINB12, FOLATE, FERRITIN, TIBC, IRON, RETICCTPCT in the last 72 hours. Urinalysis    Component Value Date/Time   COLORURINE STRAW (A) 04/18/2021 1924   APPEARANCEUR CLEAR 04/18/2021 1924   LABSPEC 1.018 04/18/2021 1924   PHURINE 5.0 04/18/2021 1924   GLUCOSEU NEGATIVE 04/18/2021 1924   HGBUR NEGATIVE 04/18/2021 1924   BILIRUBINUR NEGATIVE 04/18/2021 1924   KETONESUR NEGATIVE 04/18/2021 1924   PROTEINUR NEGATIVE 04/18/2021 1924   NITRITE NEGATIVE 04/18/2021 1924   LEUKOCYTESUR NEGATIVE 04/18/2021 1924   Sepsis Labs Invalid input(s): PROCALCITONIN,  WBC,  LACTICIDVEN Microbiology Recent Results (from the past 240 hour(s))  Resp Panel by RT-PCR (Flu A&B, Covid) Nasopharyngeal Swab     Status: None   Collection Time: 04/18/21  4:44 PM   Specimen: Nasopharyngeal Swab; Nasopharyngeal(NP) swabs in vial transport medium  Result Value Ref Range Status   SARS Coronavirus 2 by RT PCR NEGATIVE NEGATIVE Final    Comment: (NOTE) SARS-CoV-2 target nucleic acids are NOT DETECTED.  The SARS-CoV-2 RNA is generally detectable in upper respiratory specimens during the acute phase of infection. The lowest concentration of SARS-CoV-2 viral copies this assay can detect is 138 copies/mL. A negative result does not preclude SARS-Cov-2 infection and should not be used as the sole basis for treatment or other patient management decisions. A negative result may occur with  improper specimen collection/handling, submission of specimen other than nasopharyngeal swab, presence of viral mutation(s) within the areas targeted by this assay, and inadequate number of viral copies(<138 copies/mL). A negative result must be combined with clinical observations, patient history, and epidemiological information. The expected result is  Negative.  Fact Sheet for Patients:  EntrepreneurPulse.com.au  Fact Sheet for Healthcare Providers:  IncredibleEmployment.be  This test is no t yet approved or cleared by the [place] FDA and  has been authorized for detection and/or diagnosis of SARS-CoV-2 by FDA under an Emergency Use Authorization (EUA). This EUA will remain  in effect (meaning this test can be used) for the duration of the COVID-19 declaration under Section 564(b)(1) of the Act, 21 U.S.C.section 360bbb-3(b)(1), unless the authorization is terminated  or revoked sooner.       Influenza A by PCR NEGATIVE NEGATIVE Final   Influenza B by PCR NEGATIVE NEGATIVE Final    Comment: (NOTE) The  Xpert Xpress SARS-CoV-2/FLU/RSV plus assay is intended as an aid in the diagnosis of influenza from Nasopharyngeal swab specimens and should not be used as a sole basis for treatment. Nasal washings and aspirates are unacceptable for Xpert Xpress SARS-CoV-2/FLU/RSV testing.  Fact Sheet for Patients: EntrepreneurPulse.com.au  Fact Sheet for Healthcare Providers: IncredibleEmployment.be  This test is not yet approved or cleared by the [place] FDA and has been authorized for detection and/or diagnosis of SARS-CoV-2 by FDA under an Emergency Use Authorization (EUA). This EUA will remain in effect (meaning this test can be used) for the duration of the COVID-19 declaration under Section 564(b)(1) of the Act, 21 U.S.C. section 360bbb-3(b)(1), unless the authorization is terminated or revoked.  Performed at [facility], [place] [address], [place], [place] [address]   Culture, blood (routine x 2)     Status: None (Preliminary result)   Collection Time: 04/18/21 10:19 PM   Specimen: BLOOD  Result Value Ref Range Status   Specimen Description BLOOD LEFT ARM  Final   Special Requests   Final    BOTTLES DRAWN AEROBIC ONLY Blood Culture results  may not be optimal due to an inadequate volume of blood received in culture bottles   Culture   Final    NO GROWTH 3 DAYS Performed at [facility], [place] [address], [place], [place] [address]    Report Status PENDING  Incomplete  Culture, blood (routine x 2)     Status: None (Preliminary result)   Collection Time: 04/18/21 10:31 PM   Specimen: BLOOD  Result Value Ref Range Status   Specimen Description BLOOD RIGHT HAND  Final   Special Requests   Final    BOTTLES DRAWN AEROBIC ONLY Blood Culture results may not be optimal due to an inadequate volume of blood received in culture bottles   Culture   Final    NO GROWTH 3 DAYS Performed at [facility], [place] [address], [place], [place] [address]    Report Status PENDING  Incomplete     Time coordinating discharge: 45 minutes  SIGNED:   Tawni Millers, MD  [facility] 04/21/2021, 11:50 AM

## 2021-04-23 ENCOUNTER — Telehealth: Payer: Self-pay | Admitting: *Deleted

## 2021-04-23 LAB — CULTURE, BLOOD (ROUTINE X 2)
Culture: NO GROWTH
Culture: NO GROWTH

## 2021-04-23 NOTE — Telephone Encounter (Signed)
Transition Care Management Unsuccessful Follow-up Telephone Call  Date of discharge and from where:  04/21/21 [place]  Attempts:  1st Attempt  Reason for unsuccessful TCM follow-up call:  Left voice message

## 2021-04-24 NOTE — Telephone Encounter (Signed)
Transition Care Management Follow-up Telephone Call Date of discharge and from where: 04/21/2021 [place] How have you been since you were released from the hospital? Getting a lot better and stronger Any questions or concerns? No  Items Reviewed: Did the pt receive and understand the discharge instructions provided? Yes  Medications obtained and verified? Yes  Other? No  Any new allergies since your discharge? No  Dietary orders reviewed? Yes Do you have support at home? Yes   Home Care and Equipment/Supplies: Were home health services ordered? no If so, what is the name of the agency? na  Has the agency set up a time to come to the patient's home? not applicable Were any new equipment or medical supplies ordered?  No What is the name of the medical supply agency? na Were you able to get the supplies/equipment? not applicable Do you have any questions related to the use of the equipment or supplies? No  Functional Questionnaire: (I = Independent and D = Dependent) ADLs: I  Bathing/Dressing- I  Meal Prep- I  Eating- I  Maintaining continence- I  Transferring/Ambulation- I  Managing Meds- I  Follow up appointments reviewed:  PCP Hospital f/u appt confirmed? Yes  Scheduled to see Janett Billow on 04/27/2021 . [facility] f/u appt confirmed? No   Are transportation arrangements needed? No  If their condition worsens, is the pt aware to call PCP or go to the Emergency Dept.? Yes Was the patient provided with contact information for the PCP's office or ED? Yes Was to pt encouraged to call back with questions or concerns? Yes

## 2021-04-27 ENCOUNTER — Other Ambulatory Visit: Payer: Self-pay

## 2021-04-27 ENCOUNTER — Ambulatory Visit (INDEPENDENT_AMBULATORY_CARE_PROVIDER_SITE_OTHER): Payer: Medicare Other | Admitting: Nurse Practitioner

## 2021-04-27 ENCOUNTER — Encounter: Payer: Self-pay | Admitting: Nurse Practitioner

## 2021-04-27 VITALS — BP 110/68 | HR 78 | Temp 97.1°F | Ht 67.5 in | Wt 169.2 lb

## 2021-04-27 DIAGNOSIS — I10 Essential (primary) hypertension: Secondary | ICD-10-CM

## 2021-04-27 DIAGNOSIS — F1020 Alcohol dependence, uncomplicated: Secondary | ICD-10-CM

## 2021-04-27 DIAGNOSIS — F419 Anxiety disorder, unspecified: Secondary | ICD-10-CM | POA: Diagnosis not present

## 2021-04-27 DIAGNOSIS — Z7901 Long term (current) use of anticoagulants: Secondary | ICD-10-CM | POA: Diagnosis not present

## 2021-04-27 DIAGNOSIS — M25512 Pain in left shoulder: Secondary | ICD-10-CM

## 2021-04-27 DIAGNOSIS — M25511 Pain in right shoulder: Secondary | ICD-10-CM

## 2021-04-27 DIAGNOSIS — K922 Gastrointestinal hemorrhage, unspecified: Secondary | ICD-10-CM | POA: Diagnosis not present

## 2021-04-27 DIAGNOSIS — F5101 Primary insomnia: Secondary | ICD-10-CM

## 2021-04-27 DIAGNOSIS — Z952 Presence of prosthetic heart valve: Secondary | ICD-10-CM

## 2021-04-27 DIAGNOSIS — G8929 Other chronic pain: Secondary | ICD-10-CM

## 2021-04-27 DIAGNOSIS — D62 Acute posthemorrhagic anemia: Secondary | ICD-10-CM | POA: Diagnosis not present

## 2021-04-27 DIAGNOSIS — Z66 Do not resuscitate: Secondary | ICD-10-CM

## 2021-04-27 DIAGNOSIS — E782 Mixed hyperlipidemia: Secondary | ICD-10-CM
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
## 2021-04-27 NOTE — Patient Instructions (Addendum)
Decrease zoloft to 150 mg x 2 weeks (100 mg by mouth in the am and then 50 mg in pm)  then decrease to 100 mg (am only)  for 2 weeks, then decrease to 50 mg (am only) by mouth daily.  After 3-4 weeks evaluate how you are doing on that dose.    If blood pressure is staying elevated over 140/90 restart amlodipine.   Cognitive behavior therapist for sleep.   Melatonin 1 mg daily at bedtime. -- if you start let us know so we can check INR.   No caffeine after 2 pm  Tobacco makes sleep worse.

## 2021-04-28 ENCOUNTER — Other Ambulatory Visit: Payer: Self-pay | Admitting: *Deleted

## 2021-04-28 LAB — COMPLETE METABOLIC PANEL WITH GFR
AG Ratio: 1.7 (calc) (ref 1.0–2.5)
ALT: 9 U/L (ref 9–46)
AST: 16 U/L (ref 10–35)
Albumin: 3.5 g/dL — ABNORMAL LOW (ref 3.6–5.1)
Alkaline phosphatase (APISO): 98 U/L (ref 35–144)
BUN/Creatinine Ratio: 13 (calc) (ref 6–22)
BUN: 16 mg/dL (ref 7–25)
CO2: 22 mmol/L (ref 20–32)
Calcium: 8.8 mg/dL (ref 8.6–10.3)
Chloride: 107 mmol/L (ref 98–110)
Creat: 1.19 mg/dL — ABNORMAL HIGH (ref 0.70–1.18)
GFR, Est African American: 69 mL/min/{1.73_m2} (ref >=60)
GFR, Est Non African American: 60 mL/min/{1.73_m2} (ref >=60)
Globulin: 2.1 g/dL (calc) (ref 1.9–3.7)
Glucose, Bld: 96 mg/dL (ref 65–99)
Potassium: 4.2 mmol/L (ref 3.5–5.3)
Sodium: 136 mmol/L (ref 135–146)
Total Bilirubin: 0.3 mg/dL (ref 0.2–1.2)
Total Protein: 5.6 g/dL — ABNORMAL LOW (ref 6.1–8.1)

## 2021-04-28 LAB — CBC WITH DIFFERENTIAL/PLATELET
Absolute Monocytes: 510 cells/uL (ref 200–950)
Basophils Absolute: 52 cells/uL (ref 0–200)
Basophils Relative: 0.9 %
Eosinophils Absolute: 371 cells/uL (ref 15–500)
Eosinophils Relative: 6.4 %
HCT: 35.4 % — ABNORMAL LOW (ref 38.5–50.0)
Hemoglobin: 10.9 g/dL — ABNORMAL LOW (ref 13.2–17.1)
Lymphs Abs: 1160 cells/uL (ref 850–3900)
MCH: 25.3 pg — ABNORMAL LOW (ref 27.0–33.0)
MCHC: 30.8 g/dL — ABNORMAL LOW (ref 32.0–36.0)
MCV: 82.1 fL (ref 80.0–100.0)
MPV: 11 fL (ref 7.5–12.5)
Monocytes Relative: 8.8 %
Neutro Abs: 3706 cells/uL (ref 1500–7800)
Neutrophils Relative %: 63.9 %
Platelets: 341 10*3/uL (ref 140–400)
RBC: 4.31 10*6/uL (ref 4.20–5.80)
RDW: 19.6 % — ABNORMAL HIGH (ref 11.0–15.0)
Total Lymphocyte: 20 %
WBC: 5.8 10*3/uL (ref 3.8–10.8)

## 2021-04-28 LAB — PROTIME-INR
INR: 1.6 — ABNORMAL HIGH
Prothrombin Time: 15.6 s — ABNORMAL HIGH (ref 9.0–11.5)

## 2021-04-28 MED ORDER — LOVASTATIN 40 MG PO TABS: 40.0000 mg | ORAL_TABLET | Freq: Every day | ORAL | 1 refills | Status: DC

## 2021-04-28 NOTE — Telephone Encounter (Signed)
Received fax refill from pharmacy Pended Rx and sent to [facility] for approval.

## 2021-05-03 ENCOUNTER — Other Ambulatory Visit: Payer: Self-pay | Admitting: Nurse Practitioner

## 2021-05-06 ENCOUNTER — Other Ambulatory Visit: Payer: Self-pay

## 2021-05-06 ENCOUNTER — Encounter: Payer: Self-pay | Admitting: Family Medicine

## 2021-05-06 ENCOUNTER — Ambulatory Visit (INDEPENDENT_AMBULATORY_CARE_PROVIDER_SITE_OTHER): Payer: Medicare Other | Admitting: Family Medicine

## 2021-05-06 VITALS — BP 134/72 | HR 93 | Temp 97.1°F | Ht 67.5 in | Wt 172.4 lb

## 2021-05-06 DIAGNOSIS — Z7901 Long term (current) use of anticoagulants: Secondary | ICD-10-CM

## 2021-05-06 DIAGNOSIS — M25511 Pain in right shoulder: Secondary | ICD-10-CM

## 2021-05-06 DIAGNOSIS — J4520 Mild intermittent asthma, uncomplicated: Secondary | ICD-10-CM

## 2021-05-06 DIAGNOSIS — K922 Gastrointestinal hemorrhage, unspecified: Secondary | ICD-10-CM

## 2021-05-06 DIAGNOSIS — M25512 Pain in left shoulder: Secondary | ICD-10-CM

## 2021-05-06 DIAGNOSIS — G8929 Other chronic pain: Secondary | ICD-10-CM

## 2021-05-06 DIAGNOSIS — F1010 Alcohol abuse, uncomplicated: Secondary | ICD-10-CM

## 2021-05-06 LAB — POCT INR
INR: 2.1 (ref 2.0–3.0)
PT: 25

## 2021-05-06 MED ORDER — NITROGLYCERIN 0.1 MG/HR TD PT24: 0.2000 mg | MEDICATED_PATCH | Freq: Every day | TRANSDERMAL | Status: DC

## 2021-05-06 MED ORDER — LORAZEPAM 0.5 MG PO TABS: 0.5000 mg | ORAL_TABLET | ORAL | 1 refills | Status: DC

## 2021-05-06 NOTE — Progress Notes (Signed)
Provider:  [name], MD  Careteam: Patient Care Team: [name], NP as PCP - General (Geriatric Medicine)  PLACE OF SERVICE:  [place]  Advanced Directive information    Allergies  Allergen Reactions   Aspirin     Other reaction(s): Other (See Comments) Can't take due to taking blood thinners   Morphine And Related Other (See Comments)    Other reaction(s): Other (See Comments) Hallucinations     Promethazine Other (See Comments)    Other reaction(s): Other (See Comments) Hallucinations     Chief Complaint  Patient presents with   Medical Management of Chronic Issues    Patient presents today for 1 week PT/INR recheck.     HPI: Patient is a 75 y.o. male routine follow-up for PT/INR.  Since his last visit he was hospitalized for syncopal episode.  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  He now is taking iron but having some diarrhea from that.  Stressed need to take it with food and if that does not help may try different base formulation such as ferrous fumarate or ferrous gluconate. Had some issues with sleep last night due to reflux.  Think that was related to what he ate at suppertime.  Mentioned use of occasional Tums along with his PPI. Also complains of some shoulder pain due to rotator cuff tear.  Not a candidate for surgery due to anticoagulation.  Review of Systems:  Review of Systems  Respiratory: Negative.    Cardiovascular: Negative.   Genitourinary: Negative.   Musculoskeletal:  Positive for joint pain.       Bilateral shoulders right greater than left  Neurological:  Positive for loss of consciousness.  All other systems reviewed and are negative.  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   Past Surgical History:  Procedure Laterality Date   AORTIC VALVE REPLACEMENT     mechanical   BIOPSY  03/28/2021   Procedure: BIOPSY;  Surgeon: Ronald Lobo, MD;  [facility]: [facility];  Service: Endoscopy;;   BLADDER SURGERY  1983   Bladder Cancer Surgery    ESOPHAGOGASTRODUODENOSCOPY (EGD) WITH PROPOFOL N/A 03/28/2021   Procedure: ESOPHAGOGASTRODUODENOSCOPY (EGD) WITH PROPOFOL;  Surgeon: Ronald Lobo, MD;  Location[facility];  Service: Endoscopy;  Laterality: N/A;   KNEE SURGERY Right 1980   KNEE SURGERY Left 1982   OTHER SURGICAL HISTORY  1992   stomach muscles removed due to Coumadin caused bleeding.   TONSILLECTOMY  1965   Social History:   reports that he has never smoked. His smokeless tobacco use includes snuff. He reports previous alcohol use. He reports that he does not use drugs.  Family History  Problem Relation Age of Onset   Heart disease Neg Hx     Medications: Patient's Medications  New Prescriptions   No medications on file  Previous Medications  ACETAMINOPHEN (TYLENOL) 500 MG TABLET    Take 500 mg by mouth daily as needed for moderate pain.   ALBUTEROL (VENTOLIN HFA) 108 (90 BASE) MCG/ACT INHALER    Inhale 1 puff into the lungs every 6 (six) hours as needed for wheezing or shortness of breath.   FOLIC ACID (FOLVITE) 1 MG TABLET    Take 1 tablet (1 mg total) by mouth daily.   FUROSEMIDE (LASIX) 20 MG TABLET    Take 1 tablet (20 mg total) by mouth daily as needed for fluid or edema (3lb weight gain).   IRON, FERROUS SULFATE, 325 (65 FE) MG TABS    Take 325 mg by mouth in the morning and at bedtime.   LORAZEPAM (ATIVAN) 0.5 MG TABLET    Take 1 tablet  (0.5 mg total) by mouth See admin instructions. 0.5mg  in the morning 1mg  in the evening   LOVASTATIN (MEVACOR) 40 MG TABLET    Take 1 tablet (40 mg total) by mouth at bedtime.   PANTOPRAZOLE (PROTONIX) 40 MG TABLET    Take 1 tablet (40 mg total) by mouth 2 (two) times daily.   RAMIPRIL (ALTACE) 10 MG CAPSULE    Take 1 capsule (10 mg total) by mouth daily.   SERTRALINE (ZOLOFT) 100 MG TABLET    Take 1 tablet (100 mg total) by mouth 2 (two) times daily.   THIAMINE 100 MG TABLET    Take 100 mg by mouth daily.   WARFARIN (COUMADIN) 2 MG TABLET    Take 1 tablet (2 mg total) by mouth See admin instructions. Take 2.5mg  daily alternating with 3mg  daily. 2mg  tablet and 0.5mg  alternating with 2mg  tablet and 1mg  tablet.  Modified Medications   No medications on file  Discontinued Medications   No medications on file    Physical Exam:  Vitals:   05/06/21 0911  BP: 134/72  Pulse: 93  Temp: (!) 97.1 F (36.2 C)  TempSrc: Temporal  SpO2: 97%  Weight: 172 lb 6.4 oz (78.2 kg)  Height: 5' 7.5" (1.715 m)   Body mass index is 26.6 kg/m. Wt Readings from Last 3 Encounters:  05/06/21 172 lb 6.4 oz (78.2 kg)  04/27/21 169 lb 3.2 oz (76.7 kg)  04/20/21 167 lb 5.3 oz (75.9 kg)    Physical Exam Vitals and nursing note reviewed.  Constitutional:      Appearance: Normal appearance.  Cardiovascular:     Rate and Rhythm: Normal rate and regular rhythm.  Pulmonary:     Effort: Pulmonary effort is normal.     Breath sounds: Normal breath sounds.  Musculoskeletal:        General: Normal range of motion.     Cervical back: Normal range of motion.     Comments: Right shoulder: Pain with AB duction  Neurological:     General: No focal deficit present.     Mental Status: He is alert and oriented to person, place, and time.    Labs reviewed: Basic Metabolic Panel: Recent Labs    03/28/21 0905 04/18/21 1607 04/19/21 0438 04/20/21 0513 04/21/21 0213 04/27/21 1030  NA 138   < > 136 137 133*  136  K 4.2   < > 4.2 4.1 3.8 4.2  CL 109   < > 110 111 105 107  CO2 26   < > 21* 22 22 22   GLUCOSE 94   < > 95 87 104* 96  BUN 16   < > 28* 18 18 16   CREATININE 1.30*   < >  1.40* 1.40* 1.39* 1.19*  CALCIUM 8.1*   < > 8.5* 8.6* 8.7* 8.8  MG 1.7  --  1.8  --   --   --   PHOS 3.2  --  3.6  --   --   --    < > = values in this interval not displayed.   Liver Function Tests: Recent Labs    03/27/21 1532 04/18/21 1607 04/19/21 0438 04/27/21 1030  AST 17 15 12* 16  ALT 10 11 9 9   ALKPHOS 78 77 67  --   BILITOT 0.5 0.5 0.5 0.3  PROT 6.7 5.5* 4.6* 5.6*  ALBUMIN 3.3* 2.9* 2.5*  --    No results for input(s): LIPASE, AMYLASE in the last 8760 hours. No results for input(s): AMMONIA in the last 8760 hours. CBC: Recent Labs    04/20/21 0513 04/21/21 0213 04/27/21 1030  WBC 6.7 7.3 5.8  NEUTROABS 4.8 5.2 3,706  HGB 9.8* 9.3* 10.9*  HCT 31.4* 29.5* 35.4*  MCV 80.3 80.4 82.1  PLT 252 256 341   Lipid Panel: Recent Labs    03/09/21 1222  CHOL 185  HDL 67  LDLCALC 91  TRIG 171*  CHOLHDL 2.8   TSH: No results for input(s): TSH in the last 8760 hours. A1C: No results found for: HGBA1C   Assessment/Plan  1. Anticoagulant long-term use INR today was 2.1 with a goal of 2.5.  In view of his recent issues with GI bleed 2.1 may be more reasonable goal in 2.5.  We will continue same dose of Coumadin 3 mg daily - POCT INR  2. Chronic pain of both shoulders Will try nitroglycerin patch for pain and healing follow-up in 1 month - nitroGLYCERIN (NITRODUR - Dosed in mg/24 hr) patch 0.2 mg  3. Mild intermittent asthma without complication Uses albuterol inhaler as needed for wheezing.  Continue same  4. Gastrointestinal hemorrhage, unspecified gastrointestinal hemorrhage type Taking iron to build up hemoglobin.  Was transfused in the hospital after syncopal episode  5. Alcohol abuse Continues to abstain.  Praise given.   [name], MD [facility] [phone]

## 2021-05-06 NOTE — Patient Instructions (Signed)
Continue Coumadin 3 mg/day

## 2021-05-07 ENCOUNTER — Encounter (HOSPITAL_COMMUNITY): Payer: Self-pay | Admitting: Emergency Medicine

## 2021-05-07 ENCOUNTER — Emergency Department (HOSPITAL_COMMUNITY)
Admission: EM | Admit: 2021-05-07 | Discharge: 2021-05-08 | Disposition: A | Discharge status: Home / Self Care | Payer: Medicare Other | Attending: Emergency Medicine | Admitting: Emergency Medicine

## 2021-05-07 DIAGNOSIS — X501XXA Overexertion from prolonged static or awkward postures, initial encounter: Secondary | ICD-10-CM | POA: Diagnosis not present

## 2021-05-07 DIAGNOSIS — I10 Essential (primary) hypertension: Secondary | ICD-10-CM | POA: Diagnosis not present

## 2021-05-07 DIAGNOSIS — Y92009 Unspecified place in unspecified non-institutional (private) residence as the place of occurrence of the external cause: Secondary | ICD-10-CM | POA: Insufficient documentation

## 2021-05-07 DIAGNOSIS — Z7901 Long term (current) use of anticoagulants: Secondary | ICD-10-CM | POA: Diagnosis not present

## 2021-05-07 DIAGNOSIS — Z8546 Personal history of malignant neoplasm of prostate: Secondary | ICD-10-CM | POA: Insufficient documentation

## 2021-05-07 DIAGNOSIS — F17220 Nicotine dependence, chewing tobacco, uncomplicated: Secondary | ICD-10-CM | POA: Insufficient documentation

## 2021-05-07 DIAGNOSIS — J45909 Unspecified asthma, uncomplicated: Secondary | ICD-10-CM | POA: Insufficient documentation

## 2021-05-07 DIAGNOSIS — T1490XA Injury, unspecified, initial encounter: Secondary | ICD-10-CM

## 2021-05-07 DIAGNOSIS — S99922A Unspecified injury of left foot, initial encounter: Secondary | ICD-10-CM | POA: Diagnosis present

## 2021-05-07 DIAGNOSIS — S92352A Displaced fracture of fifth metatarsal bone, left foot, initial encounter for closed fracture: Secondary | ICD-10-CM

## 2021-05-07 DIAGNOSIS — Z8551 Personal history of malignant neoplasm of bladder: Secondary | ICD-10-CM | POA: Diagnosis not present

## 2021-05-07 DIAGNOSIS — Z79899 Other long term (current) drug therapy: Secondary | ICD-10-CM | POA: Diagnosis not present

## 2021-05-07 NOTE — ED Triage Notes (Signed)
Pt states he missed a step at home and now he has left foot pain and swollen.

## 2021-05-08 ENCOUNTER — Emergency Department (HOSPITAL_COMMUNITY): Payer: Medicare Other

## 2021-05-08 ENCOUNTER — Other Ambulatory Visit: Payer: Self-pay | Admitting: *Deleted

## 2021-05-08 DIAGNOSIS — S92352A Displaced fracture of fifth metatarsal bone, left foot, initial encounter for closed fracture: Secondary | ICD-10-CM | POA: Diagnosis not present

## 2021-05-08 MED ORDER — LORAZEPAM 0.5 MG PO TABS: ORAL_TABLET | ORAL | 1 refills | Status: DC

## 2021-05-08 MED ORDER — OXYCODONE-ACETAMINOPHEN 5-325 MG PO TABS: 1.0000 | ORAL_TABLET | Freq: Three times a day (TID) | ORAL | 0 refills | Status: DC | PRN

## 2021-05-08 NOTE — ED Notes (Signed)
[name] (significant other) would like to be called once placed in room. Phone number in chart.

## 2021-05-08 NOTE — ED Provider Notes (Signed)
[facility] EMERGENCY DEPARTMENT Provider Note   CSN: [id number] Arrival date & time: 05/07/21  2352     History Chief Complaint  Patient presents with  . Foot Pain    Garrett Terry is a 75 y.o. male.  Left foot pain after twisting when missing a step   Foot Pain This is a new problem. The current episode started yesterday. The problem occurs constantly. The problem has not changed since onset.Pertinent negatives include no chest pain, no abdominal pain, no headaches and no shortness of breath. Nothing aggravates the symptoms. Nothing relieves the symptoms. He has tried nothing for the symptoms. The treatment provided no relief.      Past Medical History:  Diagnosis Date  . Anxiety   . Aortic atherosclerosis ([place]) 03/28/2021  . Closed compression fracture of body of L1 vertebra ([place]) 03/28/2021  . Complication of anesthesia    PONV  . Compression fracture of T12 vertebra ([place]) 03/28/2021  . Depression   . History of bladder cancer   . History of pneumonia 1959  . History of prostate cancer   . History of scarlet fever 1956  . History of stroke 2009  . Hx of long term use of blood thinners   . Hypertension   . Long term current use of anticoagulant 03/22/2011  . 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    Patient Active Problem List   Diagnosis Date Noted  . GI bleeding 04/19/2021  . Syncope, vasovagal 04/18/2021  . Nausea and vomiting 04/18/2021  . Diverticulitis of sigmoid colon 04/18/2021  . [place] grade B esophagitis 04/18/2021  . Migraine headache with aura 04/18/2021  . Alcohol abuse 04/18/2021  . Generalized anxiety disorder 04/18/2021  . Essential hypertension 04/18/2021  . Shoulder pain 04/14/2021  . Microcytic anemia 03/28/2021  . Compression fracture of T12 vertebra ([place]) 03/28/2021  . Closed compression fracture of body of L1 vertebra ([place]) 03/28/2021  . Aortic atherosclerosis ([place]) 03/28/2021  . GI bleed 03/27/2021  . Acute blood loss anemia  03/27/2021  . Hx of mechanical aortic valve replacement 03/27/2021  . Alcohol dependence ([place]) 03/27/2021  . Anxiety 10/27/2020  . Asthma 05/09/2020  . Erectile dysfunction 02/07/2020  . Dysphagia 09/19/2016  . History of cerebrovascular accident 07/23/2015  . Allergic rhinitis 06/30/2015  . Long term current use of anticoagulant 03/22/2011  . Gastroesophageal reflux disease 07/07/2010  . Mixed hyperlipidemia 07/07/2010  . Heart valve disorder 07/07/2010    Past Surgical History:  Procedure Laterality Date  . AORTIC VALVE REPLACEMENT     mechanical  . BIOPSY  03/28/2021   Procedure: BIOPSY;  Surgeon: Ronald Lobo, MD;  Location: [facility];  Service: Endoscopy;;  . [place]   Bladder Cancer Surgery   . ESOPHAGOGASTRODUODENOSCOPY (EGD) WITH PROPOFOL N/A 03/28/2021   Procedure: ESOPHAGOGASTRODUODENOSCOPY (EGD) WITH PROPOFOL;  Surgeon: Ronald Lobo, MD;  Location: [facility];  Service: Endoscopy;  Laterality: N/A;  . KNEE SURGERY Right 1980  . KNEE SURGERY Left 1982  . OTHER SURGICAL HISTORY  1992   stomach muscles removed due to Coumadin caused bleeding.  . TONSILLECTOMY  1965       Family History  Problem Relation Age of Onset  . Heart disease Neg Hx     Social History   Tobacco Use  . Smoking status: Never  . Smokeless tobacco: Current    Types: Snuff  . Tobacco comments:    4-5 pouches a day  Vaping Use  . Vaping Use: Never used  Substance Use Topics  . Alcohol use: Not Currently    Comment: 15-20 drinks per week.  . Drug use: Never    Home Medications Prior to Admission medications   Medication Sig Start Date End Date Taking? Authorizing Provider  oxyCODONE-acetaminophen (PERCOCET) 5-325 MG tablet Take 1 tablet by mouth every 8 (eight) hours as needed for severe pain. 05/08/21  Yes Jojo Geving, Corene Cornea, MD  acetaminophen (TYLENOL) 500 MG tablet Take 500 mg by mouth daily as needed for moderate pain.    [provider]  albuterol  (VENTOLIN HFA) 108 (90 Base) MCG/ACT inhaler Inhale 1 puff into the lungs every 6 (six) hours as needed for wheezing or shortness of breath.    [provider]  folic acid (FOLVITE) 1 MG tablet Take 1 tablet (1 mg total) by mouth daily. 03/30/21   Samuella Cota, MD  furosemide (LASIX) 20 MG tablet Take 1 tablet (20 mg total) by mouth daily as needed for fluid or edema (3lb weight gain). 04/21/21   Arrien, Jimmy Picket, MD  Iron, Ferrous Sulfate, 325 (65 Fe) MG TABS Take 325 mg by mouth in the morning and at bedtime. 03/16/21   [name], NP  LORazepam (ATIVAN) 0.5 MG tablet Take 1 tablet (0.5 mg total) by mouth See admin instructions. 0.5mg  in the morning 1mg  in the evening 05/06/21   Wardell Honour, MD  lovastatin (MEVACOR) 40 MG tablet Take 1 tablet (40 mg total) by mouth at bedtime. 04/28/21   [name], NP  pantoprazole (PROTONIX) 40 MG tablet Take 1 tablet (40 mg total) by mouth 2 (two) times daily. 03/10/21   [name], NP  ramipril (ALTACE) 10 MG capsule Take 1 capsule (10 mg total) by mouth daily. 04/21/21   Arrien, Jimmy Picket, MD  sertraline (ZOLOFT) 100 MG tablet Take 1 tablet (100 mg total) by mouth 2 (two) times daily. 03/10/21   [name], NP  thiamine 100 MG tablet Take 100 mg by mouth daily.    [provider]  warfarin (COUMADIN) 2 MG tablet Take 1 tablet (2 mg total) by mouth See admin instructions. Take 2.5mg  daily alternating with 3mg  daily. 2mg  tablet and 0.5mg  alternating with 2mg  tablet and 1mg  tablet. 04/21/21   Arrien, Jimmy Picket, MD    Allergies    Aspirin, Morphine and related, and Promethazine  Review of Systems   Review of Systems  Respiratory:  Negative for shortness of breath.   Cardiovascular:  Negative for chest pain.  Gastrointestinal:  Negative for abdominal pain.  Neurological:  Negative for headaches.  All other systems reviewed and are negative.  Physical Exam Updated Vital Signs BP (!)  124/107 (BP Location: Right Arm)   Pulse 84   Temp 98.5 F (36.9 C) (Oral)   Resp 20   SpO2 99%   Physical Exam Vitals and nursing note reviewed.  Constitutional:      Appearance: He is well-developed.  HENT:     Head: Normocephalic and atraumatic.     Nose: Nose normal. No congestion or rhinorrhea.     Mouth/Throat:     Mouth: Mucous membranes are moist.     Pharynx: Oropharynx is clear.  Eyes:     Pupils: Pupils are equal, round, and reactive to light.  Cardiovascular:     Rate and Rhythm: Normal rate.  Pulmonary:     Effort: Pulmonary effort is normal. No respiratory distress.  Abdominal:     General: There is no distension.  Musculoskeletal:        General: Normal range of motion.     Cervical back: Normal range of motion.  Skin:    General: Skin is warm and dry.     Coloration: Skin is not jaundiced or pale.  Neurological:     General: No focal deficit present.     Mental Status: He is alert.    ED Results / Procedures / Treatments   Labs (all labs ordered are listed, but only abnormal results are displayed) Labs Reviewed - No data to display  EKG None  Radiology DG Ankle Complete Left  Result Date: 05/08/2021 CLINICAL DATA:  Left foot and ankle pain after a fall. EXAM: LEFT FOOT - COMPLETE 3+ VIEW; LEFT ANKLE COMPLETE - 3+ VIEW COMPARISON:  None. FINDINGS: Three views of the left foot and three views of the left ankle are obtained. Left ankle demonstrates no evidence of acute fracture or dislocation. Joint spaces are normal. No soft tissue swelling. Left foot demonstrates acute appearing mostly transverse fracture of the midshaft left fifth metatarsal bone with medial displacement of the distal fracture fragment. There appears to be an underlying old fracture deformity of the fifth metatarsal. Overlying soft tissue swelling is present. No additional fractures are identified. Vascular calcifications. IMPRESSION: 1. No acute fracture or dislocation involving the left  ankle. 2. Mostly transverse fracture of the midshaft left fifth metatarsal bone with medial displacement of the distal fracture fragment. This is superimposed on top of an apparent old fracture deformity. Electronically Signed   By: Lucienne Capers M.D.   On: 05/08/2021 00:24   DG Foot Complete Left  Result Date: 05/08/2021 CLINICAL DATA:  Left foot and ankle pain after a fall. EXAM: LEFT FOOT - COMPLETE 3+ VIEW; LEFT ANKLE COMPLETE - 3+ VIEW COMPARISON:  None. FINDINGS: Three views of the left foot and three views of the left ankle are obtained. Left ankle demonstrates no evidence of acute fracture or dislocation. Joint spaces are normal. No soft tissue swelling. Left foot demonstrates acute appearing mostly transverse fracture of the midshaft left fifth metatarsal bone with medial displacement of the distal fracture fragment. There appears to be an underlying old fracture deformity of the fifth metatarsal. Overlying soft tissue swelling is present. No additional fractures are identified. Vascular calcifications. IMPRESSION: 1. No acute fracture or dislocation involving the left ankle. 2. Mostly transverse fracture of the midshaft left fifth metatarsal bone with medial displacement of the distal fracture fragment. This is superimposed on top of an apparent old fracture deformity. Electronically Signed   By: Lucienne Capers M.D.   On: 05/08/2021 00:24    Procedures Procedures   Medications Ordered in ED Medications - No data to display  ED Course  I have reviewed the triage vital signs and the nursing notes.  Pertinent labs & imaging results that were available during my care of the patient were reviewed by me and considered in my medical decision making (see chart for details).    MDM Rules/Calculators/A&P                          L metatarsal fracture, mildly displaced. Walking boot given. Per patient request, also provided crutches. Will fu w/ his own ortho, but referral to oncall given as  well. Long discussion regarding opioid use and trying to avoid it given his known addiction to alcohol and the addictive qualities of opioids.   Final Clinical Impression(s) / ED Diagnoses Final  diagnoses:  Injury  Displaced fracture of fifth metatarsal bone, left foot, initial encounter for closed fracture    Rx / DC Orders ED Discharge Orders          Ordered    oxyCODONE-acetaminophen (PERCOCET) 5-325 MG tablet  Every 8 hours PRN        05/08/21 0519             Kadin Canipe, Corene Cornea, MD 05/08/21 [id number]

## 2021-05-08 NOTE — Discharge Instructions (Addendum)
With your history of addiction, I would be very hesitant in starting opioid medications (the prescription provided). Please try to utilize tylenol, ice/heat, non weight bearing, crutches and other non pharmacological agents to help with pain. If absolutely necessary for pain, get the prescription filled but only take for 9-10/10 severe pain.   Use the boot when ambulating until told to do otherwise by orthopedic surgeon. You are free to see whoever you like, however Dr. Doreatha Martin is our surgeon on call tonight, call his office if you can't get in with your surgeon.

## 2021-05-08 NOTE — Telephone Encounter (Signed)
Patient caregiver stated that patient did not fill the pain medication because he is in alcohol recovery. Stated that the pain is managed.     Would like the Rx for Lorazepam sent to pharmacy.

## 2021-05-08 NOTE — Progress Notes (Signed)
Orthopedic Tech Progress Note Patient Details:  Garrett Terry Oct 26, 1946 [id number]  Ortho Devices Type of Ortho Device: CAM walker, Crutches Ortho Device/Splint Location: LLE Ortho Device/Splint Interventions: Ordered, Application, Adjustment   Post Interventions Patient Tolerated: Well Instructions Provided: Care of device, Adjustment of device, Poper ambulation with device  Garrett Terry 05/08/2021, 5:17 AM

## 2021-05-08 NOTE — ED Notes (Signed)
Patient verbalizes understanding of discharge instructions. Prescriptions and follow-up care reviewed. Education provided on the use of narcotics. Opportunity for questioning and answers were provided. Armband removed by staff, pt discharged from ED ambulatory using crutches.

## 2021-05-08 NOTE — Telephone Encounter (Signed)
[name], caregiver called and stated that patient saw Dr. Sabra Heck and he was going to call in patient's Lorazepam.   Checked and the setting was set to "Print" so it was not sent to pharmacy. Dr. Sabra Heck had tried to send it on 05/06/2021.  Pended Rx and sent to [facility] for approval. (Dr. Sabra Heck out of office)

## 2021-05-08 NOTE — Telephone Encounter (Signed)
It looks like he went to the ED and was prescribed pain medication, he really should not be taking pain medication with lorazepam as this increasing risk for overdose, respiratory depression/death. Do not mind giving him a refill but want to make sure they understand he does not need to be taking medication at the same time.

## 2021-05-08 NOTE — ED Notes (Signed)
Ortho called to come place CAM boot.

## 2021-05-19 ENCOUNTER — Encounter: Payer: Self-pay | Admitting: Family Medicine

## 2021-05-19 ENCOUNTER — Ambulatory Visit (INDEPENDENT_AMBULATORY_CARE_PROVIDER_SITE_OTHER): Payer: Medicare Other | Admitting: Family Medicine

## 2021-05-19 ENCOUNTER — Other Ambulatory Visit: Payer: Self-pay

## 2021-05-19 VITALS — BP 138/72 | HR 83 | Temp 97.3°F | Resp 83

## 2021-05-19 DIAGNOSIS — F1010 Alcohol abuse, uncomplicated: Secondary | ICD-10-CM

## 2021-05-19 DIAGNOSIS — D62 Acute posthemorrhagic anemia: Secondary | ICD-10-CM

## 2021-05-19 DIAGNOSIS — Z7901 Long term (current) use of anticoagulants: Secondary | ICD-10-CM

## 2021-05-19 LAB — CBC WITH DIFFERENTIAL/PLATELET
Absolute Monocytes: 424 cells/uL (ref 200–950)
Basophils Absolute: 42 cells/uL (ref 0–200)
Basophils Relative: 0.8 %
Eosinophils Absolute: 323 cells/uL (ref 15–500)
Eosinophils Relative: 6.1 %
HCT: 38.8 % (ref 38.5–50.0)
Hemoglobin: 11.7 g/dL — ABNORMAL LOW (ref 13.2–17.1)
Lymphs Abs: 975 cells/uL (ref 850–3900)
MCH: 25.8 pg — ABNORMAL LOW (ref 27.0–33.0)
MCHC: 30.2 g/dL — ABNORMAL LOW (ref 32.0–36.0)
MCV: 85.7 fL (ref 80.0–100.0)
MPV: 10.5 fL (ref 7.5–12.5)
Monocytes Relative: 8 %
Neutro Abs: 3535 cells/uL (ref 1500–7800)
Neutrophils Relative %: 66.7 %
Platelets: 285 10*3/uL (ref 140–400)
RBC: 4.53 10*6/uL (ref 4.20–5.80)
RDW: 19.3 % — ABNORMAL HIGH (ref 11.0–15.0)
Total Lymphocyte: 18.4 %
WBC: 5.3 10*3/uL (ref 3.8–10.8)

## 2021-05-19 LAB — POCT INR
INR: 3 (ref 2.0–3.0)
PT: 36.5

## 2021-05-19 MED ORDER — FOLIC ACID 1 MG PO TABS: 1.0000 mg | ORAL_TABLET | Freq: Every day | ORAL | Status: DC

## 2021-05-19 NOTE — Patient Instructions (Signed)
Reduce Coumadin dose to 3 mg alternating with 2.5 and will recheck PT/INR in 2 weeks

## 2021-05-19 NOTE — Progress Notes (Signed)
[facility]   Provider:  [name], MD  Careteam: Patient Care Team: [name], NP as PCP - General (Geriatric Medicine)  PLACE OF SERVICE:  [place]  Advanced Directive information    Allergies  Allergen Reactions   Aspirin     Other reaction(s): Other (See Comments) Can't take due to taking blood thinners   Morphine And Related Other (See Comments)    Other reaction(s): Other (See Comments) Hallucinations     Promethazine Other (See Comments)    Other reaction(s): Other (See Comments) Hallucinations     Chief Complaint  Patient presents with   Medical Management of Chronic Issues    Patient presents today for 2 week follow-up for INR recheck.     HPI: Patient is a 75 y.o. male since his last visit he missed a step and fell on broke a bone in his left foot. Following up on loose hands, he seems to be tolerating iron preparation okay with some constipation.  Recommended possible Colace as remedy for that problem. He has not used nitroglycerin patches that I gave him for shoulder pain.  He says there was some morphine like reaction to the nitroglycerin which I do not understand. PT/INR today is 3.0.  He has been taking 3 mg of Coumadin daily.  Review of Systems:  Review of Systems  HENT: Negative.    Respiratory:  Positive for wheezing.   Cardiovascular: Negative.   Genitourinary: Negative.   Musculoskeletal: Negative.   All other systems reviewed and are negative.  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[place]) 03/28/2021   Closed compression fracture of body of L1 vertebra ([place]) 1/60/7371   Complication of anesthesia    PONV   Compression fracture of T12 vertebra ([facility]) 03/28/2021   Depression    History of bladder cancer    History of pneumonia 1959   History of prostate cancer    History of scarlet fever 1956   History of stroke 2009   Hx of long term use of blood thinners    Hypertension    Long term  current use of anticoagulant 03/22/2011   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   Past Surgical History:  Procedure Laterality Date   AORTIC VALVE REPLACEMENT     mechanical   BIOPSY  03/28/2021   Procedure: BIOPSY;  Surgeon: Ronald Lobo, MD;  Location: [facility];  Service: Endoscopy;;   BLADDER SURGERY  1983   Bladder Cancer Surgery    ESOPHAGOGASTRODUODENOSCOPY (EGD) WITH PROPOFOL N/A 03/28/2021   Procedure: ESOPHAGOGASTRODUODENOSCOPY (EGD) WITH PROPOFOL;  Surgeon: Ronald Lobo, MD;  Location: [facility];  Service: Endoscopy;  Laterality: N/A;   KNEE SURGERY Right 1980   KNEE SURGERY Left 1982   OTHER SURGICAL HISTORY  1992   stomach muscles removed due to Coumadin caused bleeding.   TONSILLECTOMY  1965   Social History:   reports that he has never smoked. His smokeless tobacco use includes snuff. He reports previous alcohol use. He reports that he does not use drugs.  Family History  Problem Relation Age of Onset   Heart disease Neg Hx     Medications: Patient's Medications  New Prescriptions   No medications on file  Previous Medications   ACETAMINOPHEN (TYLENOL) 500 MG TABLET    Take 500 mg by mouth daily as needed for moderate pain.   ALBUTEROL (VENTOLIN HFA) 108 (90 BASE) MCG/ACT INHALER    Inhale 1 puff into the lungs every 6 (six)  hours as needed for wheezing or shortness of breath.   FOLIC ACID (FOLVITE) 1 MG TABLET    Take 1 tablet (1 mg total) by mouth daily.   FUROSEMIDE (LASIX) 20 MG TABLET    Take 1 tablet (20 mg total) by mouth daily as needed for fluid or edema (3lb weight gain).   IRON, FERROUS SULFATE, 325 (65 FE) MG TABS    Take 325 mg by mouth in the morning and at bedtime.   LORAZEPAM (ATIVAN) 0.5 MG TABLET    Take one tablet by mouth in the morning and take two tablets by mouth in the evening.   LOVASTATIN (MEVACOR) 40 MG TABLET    Take 1 tablet (40 mg total) by mouth at bedtime.   PANTOPRAZOLE (PROTONIX) 40 MG TABLET    Take 1 tablet (40 mg total) by mouth 2 (two)  times daily.   RAMIPRIL (ALTACE) 10 MG CAPSULE    Take 1 capsule (10 mg total) by mouth daily.   SERTRALINE (ZOLOFT) 100 MG TABLET    Take 1 tablet (100 mg total) by mouth 2 (two) times daily.   WARFARIN (COUMADIN) 2 MG TABLET    Take 1 tablet (2 mg total) by mouth See admin instructions. Take 2.5mg  daily alternating with 3mg  daily. 2mg  tablet and 0.5mg  alternating with 2mg  tablet and 1mg  tablet.  Modified Medications   No medications on file  Discontinued Medications   OXYCODONE-ACETAMINOPHEN (PERCOCET) 5-325 MG TABLET    Take 1 tablet by mouth every 8 (eight) hours as needed for severe pain.   THIAMINE 100 MG TABLET    Take 100 mg by mouth daily.    Physical Exam:  Vitals:   05/19/21 1107  BP: 138/72  Pulse: 83  Resp: (!) 83  Temp: (!) 97.3 F (36.3 C)  TempSrc: Temporal  SpO2: 97%   There is no height or weight on file to calculate BMI. Wt Readings from Last 3 Encounters:  05/06/21 172 lb 6.4 oz (78.2 kg)  04/27/21 169 lb 3.2 oz (76.7 kg)  04/20/21 167 lb 5.3 oz (75.9 kg)    Physical Exam Vitals and nursing note reviewed.  Constitutional:      Appearance: Normal appearance.  Cardiovascular:     Rate and Rhythm: Normal rate and regular rhythm.  Pulmonary:     Effort: Pulmonary effort is normal.     Breath sounds: Normal breath sounds.  Neurological:     General: No focal deficit present.     Mental Status: He is alert and oriented to person, place, and time.    Labs reviewed: Basic Metabolic Panel: Recent Labs    03/28/21 0905 04/18/21 1607 04/19/21 0438 04/20/21 0513 04/21/21 0213 04/27/21 1030  NA 138   < > 136 137 133* 136  K 4.2   < > 4.2 4.1 3.8 4.2  CL 109   < > 110 111 105 107  CO2 26   < > 21* 22 22 22   GLUCOSE 94   < > 95 87 104* 96  BUN 16   < > 28* 18 18 16   CREATININE 1.30*   < > 1.40* 1.40* 1.39* 1.19*  CALCIUM 8.1*   < > 8.5* 8.6* 8.7* 8.8  MG 1.7  --  1.8  --   --   --   PHOS 3.2  --  3.6  --   --   --    < > = values in this interval  not displayed.   Liver  Function Tests: Recent Labs    03/27/21 1532 04/18/21 1607 04/19/21 0438 04/27/21 1030  AST 17 15 12* 16  ALT 10 11 9 9   ALKPHOS 78 77 67  --   BILITOT 0.5 0.5 0.5 0.3  PROT 6.7 5.5* 4.6* 5.6*  ALBUMIN 3.3* 2.9* 2.5*  --    No results for input(s): LIPASE, AMYLASE in the last 8760 hours. No results for input(s): AMMONIA in the last 8760 hours. CBC: Recent Labs    04/20/21 0513 04/21/21 0213 04/27/21 1030  WBC 6.7 7.3 5.8  NEUTROABS 4.8 5.2 3,706  HGB 9.8* 9.3* 10.9*  HCT 31.4* 29.5* 35.4*  MCV 80.3 80.4 82.1  PLT 252 256 341   Lipid Panel: Recent Labs    03/09/21 1222  CHOL 185  HDL 67  LDLCALC 91  TRIG 171*  CHOLHDL 2.8   TSH: No results for input(s): TSH in the last 8760 hours. A1C: No results found for: HGBA1C   Assessment/Plan  1. Anticoagulant long-term use INR is 3.10.  Goal is 2.5-3.5.  Reduce Coumadin to 2.5 alternating with 3 - POCT INR  2. Acute blood loss anemia Last hemoglobin was 10.3.  Would like to see that it is increasing since he has been on iron - CBC with Differential/Platelet  3. Alcohol abuse Denies use of alcohol continue with folic acid supplement - folic acid (FOLVITE) 1 MG tablet; Take 1 tablet (1 mg total) by mouth daily.   [name], MD [facility] [phone]

## 2021-05-26 NOTE — Progress Notes (Deleted)
Cardiology Office Note:    Date:  05/26/2021   ID:  Garrett Terry, Garrett Terry Sep 20, 1946, MRN [id number]  PCP:  [name], NP   [facility] Providers Cardiologist:  None {    Referring MD: Samuella Cota, MD    History of Present Illness:    Leston Holthus is a 75 y.o. male with a hx of mechanical aortic valve replacement, alcohol abuse, generalized anxiety disorder, esophagitis, hypertension, dyslipidemia and migraines who was referred by Dr. Sarajane Jews for further evaluation of mechanical AVR.  Of note, the patient was admitted from 04/18/21-04/21/21 for sigmoid diverticulitis, syncope and acute lower GIB. He was given IVF, 2uPRBC, and ABX with improvement. Hemoglobin improved and patient was discharged on home warfarin  TTE 04/19/21 with LVEF 60-65%, mild LVH, G1DD, trivial MR, normal appearing mechanical AVR with mean gradient 14/DI 0.44, mild ascending aorta dilation measuring 68m.  Today,   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[place] 03/28/20[place]   Closed compression [place]1 [place] [id number]  Complication of anesthesia    PONV   Compression fracture of T12 vertebra ([facility]) 03/28/2021   Depression    History of bladder cancer    History of pneumonia 1959   History of prostate cancer    History of scarlet fever 1956   History of stroke 2009   Hx of long term use of blood thinners    Hypertension    Long term current use of anticoagulant 03/22/2011   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    Past Surgical History:  Procedure Laterality Date   AORTIC VALVE REPLACEMENT     mechanical   BIOPSY  03/28/2021   Procedure: BIOPSY;  Surgeon: BRonald Lobo MD;  Location: [facility];  Service: Endoscopy;;   BLADDER SURGERY  1983   Bladder Cancer Surgery    ESOPHAGOGASTRODUODENOSCOPY (EGD) WITH PROPOFOL N/A 03/28/2021   Procedure: ESOPHAGOGASTRODUODENOSCOPY (EGD) WITH PROPOFOL;  Surgeon: BRonald Lobo MD;  Location: [facility]  ENDOSCOPY;  Service: Endoscopy;  Laterality: N/A;   KNEE SURGERY Right 1980   KNEE SURGERY Left 1982   OTHER SURGICAL HISTORY  1992   stomach muscles removed due to Coumadin caused bleeding.   TONSILLECTOMY  1965    Current Medications: No outpatient medications have been marked as taking for the 06/02/21 encounter (Appointment) with PFreada Bergeron MD.   Current Facility-Administered Medications for the 06/02/21 encounter (Appointment) with PFreada Bergeron MD  Medication   nitroGLYCERIN (NITRODUR - Dosed in mg/24 hr) patch 0.2 mg     Allergies:   Aspirin, Morphine and related, and Promethazine   Social History   Socioeconomic History   Marital status: Widowed    Spouse name: Not on file   Number of children: Not on file   Years of education: Not on file   Highest education level: Not on file  Occupational History   Not on file  Tobacco Use   Smoking status: Never   Smokeless tobacco: Current    Types: Snuff   Tobacco comments:    4-5 pouches a day  Vaping Use   Vaping Use: Never used  Substance and Sexual Activity   Alcohol use: Not Currently    Comment: 15-20 drinks per week.   Drug use: Never   Sexual activity: Not on file  Other Topics Concern   Not on file  Social History Narrative   Tobacco use, amount per day now: Dip 4 plugs per  day.   Past tobacco use, amount per day:   How many years did you use tobacco: 40   Alcohol use (drinks per week): 15-25 drinks.   Diet: poor   Do you drink/eat things with caffeine: Yes   Marital status: Widowed.                                 What year were you married? 1973   Do you live in a house, apartment, assisted living, condo, trailer, etc.? House   Is it one or more stories? One   How many persons live in your home? Three   Do you have pets in your home?( please list) No   Highest Level of education completed? PhD Candidate.   Current or past profession: Tourist information centre manager, Principle, and Leisure centre manager.   Do you exercise?  No                                 Type and how often?   Do you have a living will? Yes   Do you have a DNR form? Yes                                  If not, do you want to discuss one?   Do you have signed POA/HPOA forms?  Yes                      If so, please bring to you appointment      Do you have any difficulty bathing or dressing yourself?    Do you have any difficulty preparing food or eating?   Do you have any difficulty managing your medications?   Do you have any difficulty managing your finances?   Do you have any difficulty affording your medications?    Social Determinants of Health   Financial Resource Strain: Not on file  Food Insecurity: Not on file  Transportation Needs: Not on file  Physical Activity: Not on file  Stress: Not on file  Social Connections: Not on file     Family History: The patient's ***family history is negative for Heart disease.  ROS:   Please see the history of present illness.    *** All other systems reviewed and are negative.  EKGs/Labs/Other Studies Reviewed:    The following studies were reviewed today: TTE 2021-04-24: IMPRESSIONS   1. Left ventricular ejection fraction, by estimation, is 60 to 65%. The  left ventricle has normal function. The left ventricle has no regional  wall motion abnormalities. There is mild left ventricular hypertrophy.  Left ventricular diastolic parameters  are consistent with Grade I diastolic dysfunction (impaired relaxation).   2. Right ventricular systolic function is normal. The right ventricular  size is normal. There is normal pulmonary artery systolic pressure.   3. The mitral valve is abnormal. Trivial mitral valve regurgitation.   4. The aortic valve has been repaired/replaced. Aortic valve  regurgitation is trivial. There is a mechanical valve present in the  aortic position. Echo findings are consistent with normal structure and  function of the aortic valve prosthesis. Aortic  valve mean  gradient measures 14.0 mmHg. Normal occluder movement - no  evidence of obstruction. DI is 0.44.   5. Aortic dilatation noted. There is mild dilatation  of the ascending  aorta, measuring 40 mm.   6. The inferior vena cava is normal in size with greater than 50%  respiratory variability, suggesting right atrial pressure of 3 mmHg.   CTA chest abdomen pelvis 04/18/21: FINDINGS: CTA CHEST FINDINGS   Cardiovascular: Initial precontrast images were obtained and reveal atherosclerotic calcification. No aneurysmal dilatation is noted. Prior aortic valve replacement is seen.   Post-contrast images demonstrate atherosclerotic calcification of the thoracic aorta. No dissection is noted. The ascending aorta is prominent to 4 cm. Coronary calcifications are noted. The pulmonary artery as visualized is within normal limits.   Mediastinum/Nodes: Thoracic inlet is within normal limits. No sizable hilar or mediastinal adenopathy is noted. The esophagus as visualized is within normal limits with the exception of a small sliding-type hiatal hernia. Some calcified mediastinal nodes are seen consistent with prior granulomatous disease.   Lungs/Pleura: Lungs are well aerated bilaterally. A few scattered calcified granulomas are seen. No sizable effusion is noted. No focal infiltrate is seen.   Musculoskeletal: Mild degenerative changes of the thoracic spine are noted. Multiple old healed rib fractures are noted on the right. Chronic appearing T12 compression deformity is noted.   Review of the MIP images confirms the above findings.   CTA ABDOMEN AND PELVIS FINDINGS   VASCULAR   Aorta: Atherosclerotic calcifications are noted without aneurysmal dilatation or dissection.   Celiac: Mild atherosclerotic changes are noted without focal significant stenosis.   SMA: Mild atherosclerotic calcifications are noted without focal significant stenosis.   Renals: Both renal arteries are patent without  evidence of aneurysm, dissection, vasculitis, fibromuscular dysplasia or significant stenosis.   IMA: Patent without evidence of aneurysm, dissection, vasculitis or significant stenosis.   Iliacs: Atherosclerotic calcifications are noted without aneurysmal dilatation or dissection.   Veins: No specific venous abnormality is noted.   Review of the MIP images confirms the above findings.   NON-VASCULAR   Hepatobiliary: No focal liver abnormality is seen. No gallstones, gallbladder wall thickening, or biliary dilatation.   Pancreas: Unremarkable. No pancreatic ductal dilatation or surrounding inflammatory changes.   Spleen: Spleen demonstrates scattered calcified granulomas. No focal abnormality is noted.   Adrenals/Urinary Tract: Adrenal glands are within normal limits. Kidneys demonstrate a normal enhancement pattern bilaterally. No renal calculi or obstructive changes are seen. The bladder is well distended.   Stomach/Bowel: Diverticular change of the colon is noted. Very mild inflammatory changes are seen within the descending colon as well as the junction of the descending and ascending colons. No evidence of perforation or abscess formation is seen. Appendix is not well visualized although no inflammatory changes are seen to suggest appendicitis. Small bowel and stomach are within normal limits.   Lymphatic: No significant lymphadenopathy is identified.   Reproductive: Prostate is unremarkable.   Other: No abdominal wall hernia or abnormality. No abdominopelvic ascites.   Musculoskeletal: Chronic L1 compression deformity is noted of lesser degree than that seen at T12. This is also stable from the prior exam.   Review of the MIP images confirms the above findings.   IMPRESSION: Changes of mild diverticulitis in the descending and sigmoid colon. No perforation or abscess formation is seen.   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 No evidence  of dissection is noted within the thoracic or abdominal aorta. Recommend annual imaging followup by CTA or MRA. This recommendation follows 2010 ACCF/AHA/AATS/ACR/ASA/SCA/SCAI/SIR/STS/SVM Guidelines for the Diagnosis and Management of Patients with Thoracic Aortic Disease. Circulation. 2010; [id number]. Aortic aneurysm NOS (ICD10-I71.9)  Hiatal hernia.   Changes of prior granulomatous disease.   Chronic T12 and L1 compression deformities.   Aortic Atherosclerosis (ICD10-I70.0).  EKG:  EKG is *** ordered today.  The ekg ordered today demonstrates ***  Recent Labs: 04/19/2021: Magnesium 1.8 04/27/2021: ALT 9; BUN 16; Creat 1.19; Potassium 4.2; Sodium 136 05/19/2021: Hemoglobin 11.7; Platelets 285  Recent Lipid Panel    Component Value Date/Time   CHOL 185 03/09/2021 1222   TRIG 171 (H) 03/09/2021 1222   HDL 67 03/09/2021 1222   CHOLHDL 2.8 03/09/2021 1222   LDLCALC 91 03/09/2021 1222     Risk Assessment/Calculations:   {Does this patient have ATRIAL FIBRILLATION?[phone]}       Physical Exam:    VS:  There were no vitals taken for this visit.    Wt Readings from Last 3 Encounters:  05/06/21 172 lb 6.4 oz (78.2 kg)  04/27/21 169 lb 3.2 oz (76.7 kg)  04/20/21 167 lb 5.3 oz (75.9 kg)     GEN: *** Well nourished, well developed in no acute distress HEENT: Normal NECK: No JVD; No carotid bruits LYMPHATICS: No lymphadenopathy CARDIAC: ***RRR, no murmurs, rubs, gallops RESPIRATORY:  Clear to auscultation without rales, wheezing or rhonchi  ABDOMEN: Soft, non-tender, non-distended MUSCULOSKELETAL:  No edema; No deformity  SKIN: Warm and dry NEUROLOGIC:  Alert and oriented x 3 PSYCHIATRIC:  Normal affect   ASSESSMENT:    No diagnosis found. PLAN:    In order of problems listed above:  #Mechanical AVR: TTE 04/19/21 with LVEF 60-65%, mild LVH, G1DD, trivial MR, normal appearing mechanical AVR with mean gradient 14/DI 0.44, mild ascending aorta dilation  measuring 94m. 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 Will need annual monitoring.  -Continue serial annual monitoring with next CTA 04/2022 -Continue rampril '10mg'$  daily -Continue lovastatin '40mg'$  daily  #HLD: -Continue lovastatin '40mg'$  daily  #History of lower GIB secondary to sigmoid diverticulitis: Occurred in 04/2021 as detailed above. Managed conservatively. -Follow-up with PCP as scheduled  {Are you ordering a CV Procedure (e.g. stress test, cath, DCCV, TEE, etc)?   Press F2        :[id number]   Medication Adjustments/Labs and Tests Ordered: Current medicines are reviewed at length with the patient today.  Concerns regarding medicines are outlined above.  No orders of the defined types were placed in this encounter.  No orders of the defined types were placed in this encounter.   There are no Patient Instructions on file for this visit.   Signed, HFreada Bergeron MD  05/26/2021 3:13 PM    [facility]

## 2021-06-02 ENCOUNTER — Ambulatory Visit: Payer: Commercial Managed Care - PPO | Admitting: Cardiology

## 2021-06-05 ENCOUNTER — Other Ambulatory Visit: Payer: Self-pay

## 2021-06-05 ENCOUNTER — Encounter: Payer: Self-pay | Admitting: Nurse Practitioner

## 2021-06-05 ENCOUNTER — Ambulatory Visit (INDEPENDENT_AMBULATORY_CARE_PROVIDER_SITE_OTHER): Payer: Medicare Other | Admitting: Nurse Practitioner

## 2021-06-05 VITALS — BP 130/74 | HR 85 | Temp 97.2°F | Ht 69.0 in | Wt 178.0 lb

## 2021-06-05 DIAGNOSIS — F1010 Alcohol abuse, uncomplicated: Secondary | ICD-10-CM

## 2021-06-05 DIAGNOSIS — Z7901 Long term (current) use of anticoagulants: Secondary | ICD-10-CM

## 2021-06-05 DIAGNOSIS — F5101 Primary insomnia: Secondary | ICD-10-CM | POA: Diagnosis not present

## 2021-06-05 DIAGNOSIS — Z952 Presence of prosthetic heart valve: Secondary | ICD-10-CM | POA: Diagnosis not present

## 2021-06-05 LAB — POCT INR: INR: 1.9 — AB (ref 2.0–3.0)

## 2021-06-05 NOTE — Patient Instructions (Addendum)
Can try melatonin 1 mg by mouth at bedtime Read sleep information   Increase coumadin to 3.5 mg by mouth daily   Only take b12 in the morning

## 2021-06-05 NOTE — Progress Notes (Signed)
Careteam: Patient Care Team: [name], NP as PCP - General (Geriatric Medicine)  PLACE OF SERVICE:  [place]  Advanced Directive information    Allergies  Allergen Reactions   Aspirin     Other reaction(s): Other (See Comments) Can't take due to taking blood thinners   Morphine And Related Other (See Comments)    Other reaction(s): Other (See Comments) Hallucinations     Promethazine Other (See Comments)    Other reaction(s): Other (See Comments) Hallucinations     Chief Complaint  Patient presents with   Anticoagulation    2 week PT/INR check, no missed doses or bleeding.      HPI: Patient is a 75 y.o. male for INR check. He is currently taking coumadin 2.5 mg daily for aortic valve replacement, goal 2.5-3.5.  Last INR was 3.0.  No missed doses. No signs of bleeding or abnormal bruising at this time.  Hx of GI bleed in May, no recurrent bleeding   He has added vit D3 and vit B12 since last visit.  Carafate was added by GI took yesterday but does not want to take anymore.   Healing from left foot fracture- now has walking boot.   Continues to have stopped ETOH. Continues with support group and therapy.   Continues to have sleeping problems. Anxiety stable.    Review of Systems:  Review of Systems  Constitutional:  Negative for chills, fever and weight loss.  HENT:  Negative for tinnitus.   Respiratory:  Negative for cough, sputum production and shortness of breath.   Cardiovascular:  Negative for chest pain, palpitations and leg swelling.  Gastrointestinal:  Negative for abdominal pain, constipation, diarrhea and heartburn.  Genitourinary:  Negative for dysuria, frequency and urgency.  Musculoskeletal:  Negative for back pain, falls, joint pain and myalgias.  Skin: Negative.   Neurological:  Negative for dizziness and headaches.  Psychiatric/Behavioral:  Negative for depression and memory loss. The patient does not have insomnia.    Past  Medical History:  Diagnosis Date   Anxiety    Aortic atherosclerosis ([place]) 03/28/2021   Closed compression fracture of body of L1 vertebra ([place]) [id number]   Complication of anesthesia    PONV   Compression fracture of T12 vertebra ([facility]) 03/28/2021   Depression    History of bladder cancer    History of pneumonia 1959   History of prostate cancer    History of scarlet fever 1956   History of stroke 2009   Hx of long term use of blood thinners    Hypertension    Long term current use of anticoagulant 03/22/2011   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   Past Surgical History:  Procedure Laterality Date   AORTIC VALVE REPLACEMENT     mechanical   BIOPSY  03/28/2021   Procedure: BIOPSY;  Surgeon: Ronald Lobo, MD;  Location: [facility];  Service: Endoscopy;;   BLADDER SURGERY  1983   Bladder Cancer Surgery    ESOPHAGOGASTRODUODENOSCOPY (EGD) WITH PROPOFOL N/A 03/28/2021   Procedure: ESOPHAGOGASTRODUODENOSCOPY (EGD) WITH PROPOFOL;  Surgeon: Ronald Lobo, MD;  Location: [facility];  Service: Endoscopy;  Laterality: N/A;   KNEE SURGERY Right 1980   KNEE SURGERY Left 1982   OTHER SURGICAL HISTORY  1992   stomach muscles removed due to Coumadin caused bleeding.   TONSILLECTOMY  1965   Social History:   reports that he has never smoked. His smokeless tobacco use includes snuff. He reports previous alcohol use. He reports that he  does not use drugs.  Family History  Problem Relation Age of Onset   Heart disease Neg Hx     Medications: Patient's Medications  New Prescriptions   No medications on file  Previous Medications   ACETAMINOPHEN (TYLENOL) 500 MG TABLET    Take 500 mg by mouth daily as needed for moderate pain.   ALBUTEROL (VENTOLIN HFA) 108 (90 BASE) MCG/ACT INHALER    Inhale 1 puff into the lungs every 6 (six) hours as needed for wheezing or shortness of breath.   CHOLECALCIFEROL (VITAMIN D-3 PO)    Take 1 tablet by mouth 2 (two) times daily. Dose unknown   FOLIC ACID (FOLVITE) 1 MG  TABLET    Take 1 tablet (1 mg total) by mouth daily.   FUROSEMIDE (LASIX) 20 MG TABLET    Take 1 tablet (20 mg total) by mouth daily as needed for fluid or edema (3lb weight gain).   IRON, FERROUS SULFATE, 325 (65 FE) MG TABS    Take 325 mg by mouth in the morning and at bedtime.   LORAZEPAM (ATIVAN) 0.5 MG TABLET    Take one tablet by mouth in the morning and take two tablets by mouth in the evening.   LOVASTATIN (MEVACOR) 40 MG TABLET    Take 1 tablet (40 mg total) by mouth at bedtime.   PANTOPRAZOLE (PROTONIX) 40 MG TABLET    Take 1 tablet (40 mg total) by mouth 2 (two) times daily.   PROPYLENE GLYCOL (SYSTANE BALANCE) 0.6 % SOLN    Apply 1 drop to eye as needed (for dry eye).   RAMIPRIL (ALTACE) 10 MG CAPSULE    Take 1 capsule (10 mg total) by mouth daily.   SERTRALINE (ZOLOFT) 100 MG TABLET    Take 50 mg by mouth daily.   VITAMIN B-12 (CYANOCOBALAMIN) 1000 MCG TABLET    Take 1,000 mcg by mouth daily.   WARFARIN (COUMADIN) 1 MG TABLET    Take 1 mg by mouth daily. Along with a 2 mg tablet totaling 3 mg daily   WARFARIN (COUMADIN) 2 MG TABLET    Take 2 mg by mouth daily. Along with 1 mg totaling 3 mg daily  Modified Medications   No medications on file  Discontinued Medications   SERTRALINE (ZOLOFT) 100 MG TABLET    Take 1 tablet (100 mg total) by mouth 2 (two) times daily.   WARFARIN (COUMADIN) 2 MG TABLET    Take 1 tablet (2 mg total) by mouth See admin instructions. Take 2.'5mg'$  daily alternating with '3mg'$  daily. '2mg'$  tablet and 0.'5mg'$  alternating with '2mg'$  tablet and '1mg'$  tablet.    Physical Exam:  Vitals:   06/05/21 0947  BP: 130/74  Pulse: 85  Temp: (!) 97.2 F (36.2 C)  TempSrc: Temporal  SpO2: 99%  Weight: 178 lb (80.7 kg)  Height: '5\' 9"'$  (1.753 m)   Body mass index is 26.29 kg/m. Wt Readings from Last 3 Encounters:  06/05/21 178 lb (80.7 kg)  05/06/21 172 lb 6.4 oz (78.2 kg)  04/27/21 169 lb 3.2 oz (76.7 kg)    Physical Exam Constitutional:      General: He is not in  acute distress.    Appearance: He is well-developed. He is not diaphoretic.  HENT:     Head: Normocephalic and atraumatic.     Mouth/Throat:     Pharynx: No oropharyngeal exudate.  Eyes:     Conjunctiva/sclera: Conjunctivae normal.     Pupils: Pupils are equal, round, and reactive  to light.  Cardiovascular:     Rate and Rhythm: Normal rate and regular rhythm.     Heart sounds: Murmur heard.  Pulmonary:     Effort: Pulmonary effort is normal.     Breath sounds: Normal breath sounds.  Abdominal:     General: Bowel sounds are normal.     Palpations: Abdomen is soft.  Musculoskeletal:        General: No tenderness.     Cervical back: Normal range of motion and neck supple.     Right lower leg: No edema.     Left lower leg: No edema.  Skin:    General: Skin is warm and dry.  Neurological:     Mental Status: He is alert and oriented to person, place, and time.  Psychiatric:        Mood and Affect: Mood normal.    Labs reviewed: Basic Metabolic Panel: Recent Labs    03/28/21 0905 04/18/21 1607 04/19/21 0438 04/20/21 0513 04/21/21 0213 04/27/21 1030  NA 138   < > 136 137 133* 136  K 4.2   < > 4.2 4.1 3.8 4.2  CL 109   < > 110 111 105 107  CO2 26   < > 21* '22 22 22  '$ GLUCOSE 94   < > 95 87 104* 96  BUN 16   < > 28* '18 18 16  '$ CREATININE 1.30*   < > 1.40* 1.40* 1.39* 1.19*  CALCIUM 8.1*   < > 8.5* 8.6* 8.7* 8.8  MG 1.7  --  1.8  --   --   --   PHOS 3.2  --  3.6  --   --   --    < > = values in this interval not displayed.   Liver Function Tests: Recent Labs    03/27/21 1532 04/18/21 1607 04/19/21 0438 04/27/21 1030  AST 17 15 12* 16  ALT '10 11 9 9  '$ ALKPHOS 78 77 67  --   BILITOT 0.5 0.5 0.5 0.3  PROT 6.7 5.5* 4.6* 5.6*  ALBUMIN 3.3* 2.9* 2.5*  --    No results for input(s): LIPASE, AMYLASE in the last 8760 hours. No results for input(s): AMMONIA in the last 8760 hours. CBC: Recent Labs    04/21/21 0213 04/27/21 1030 05/19/21 1159  WBC 7.3 5.8 5.3   NEUTROABS 5.2 3,706 3,535  HGB 9.3* 10.9* 11.7*  HCT 29.5* 35.4* 38.8  MCV 80.4 82.1 85.7  PLT 256 341 285   Lipid Panel: Recent Labs    03/09/21 1222  CHOL 185  HDL 67  LDLCALC 91  TRIG 171*  CHOLHDL 2.8   TSH: No results for input(s): TSH in the last 8760 hours. A1C: No results found for: HGBA1C   Assessment/Plan 1. Anticoagulant long-term use INR 1.9, goal is 2.5-3.5. he has added several supplements since last visit and taken medication he does not plan on continuing. Will increase coumadin to 3.5 mg by mouth daily and repeat INR in 10 days to ensure trending up to goal.  - POC INR  2. Alcohol abuse -continues to sustain from ETOH.  3. H/O aortic valve replacement -currently on coumadin.   4. Primary insomnia -to start melatonin 1 mg daily. Sleep handout given.   Next appt: 10 days.  Carlos American. [place], [facility] [phone]

## 2021-06-15 ENCOUNTER — Ambulatory Visit (INDEPENDENT_AMBULATORY_CARE_PROVIDER_SITE_OTHER): Payer: Medicare Other | Admitting: Nurse Practitioner

## 2021-06-15 ENCOUNTER — Encounter: Payer: Self-pay | Admitting: Nurse Practitioner

## 2021-06-15 ENCOUNTER — Other Ambulatory Visit: Payer: Self-pay

## 2021-06-15 VITALS — BP 128/76 | HR 81 | Temp 97.9°F | Ht 69.0 in | Wt 179.0 lb

## 2021-06-15 DIAGNOSIS — F5101 Primary insomnia: Secondary | ICD-10-CM

## 2021-06-15 DIAGNOSIS — I10 Essential (primary) hypertension: Secondary | ICD-10-CM | POA: Diagnosis not present

## 2021-06-15 DIAGNOSIS — B351 Tinea unguium: Secondary | ICD-10-CM

## 2021-06-15 DIAGNOSIS — F419 Anxiety disorder, unspecified: Secondary | ICD-10-CM

## 2021-06-15 DIAGNOSIS — Z952 Presence of prosthetic heart valve: Secondary | ICD-10-CM

## 2021-06-15 DIAGNOSIS — Z7901 Long term (current) use of anticoagulants: Secondary | ICD-10-CM | POA: Diagnosis not present

## 2021-06-15 LAB — POCT INR: INR: 2.8 (ref 2.0–3.0)

## 2021-06-15 MED ORDER — RAMIPRIL 10 MG PO CAPS: 20.0000 mg | ORAL_CAPSULE | Freq: Every day | ORAL | 1 refills | Status: DC

## 2021-06-15 MED ORDER — SERTRALINE HCL 25 MG PO TABS: 25.0000 mg | ORAL_TABLET | Freq: Every day | ORAL | 1 refills | Status: DC

## 2021-06-15 MED ORDER — WARFARIN SODIUM 1 MG PO TABS: 0.5000 mg | ORAL_TABLET | Freq: Every day | ORAL | 1 refills | Status: DC

## 2021-06-15 MED ORDER — TRAZODONE HCL 50 MG PO TABS: 25.0000 mg | ORAL_TABLET | Freq: Every day | ORAL | 3 refills | Status: DC

## 2021-06-15 MED ORDER — WARFARIN SODIUM 3 MG PO TABS: 3.0000 mg | ORAL_TABLET | Freq: Every day | ORAL | 1 refills | Status: DC

## 2021-06-15 NOTE — Patient Instructions (Addendum)
Decrease zoloft to 25 mg daily, to take for 2 weeks then decrease to half tablet for 2 weeks then stop.   Start trazodone 25 mg by mouth at bedtime.  Coumadin new RX sent for 3 mg - add 0.5 mg to = 3.5 mg by mouth daily at bedtime

## 2021-06-15 NOTE — Progress Notes (Signed)
Careteam: Patient Care Team: [name], NP as PCP - General (Geriatric Medicine)  PLACE OF SERVICE:  [place]  Advanced Directive information    Allergies  Allergen Reactions   Aspirin     Other reaction(s): Other (See Comments) Can't take due to taking blood thinners   Morphine And Related Other (See Comments)    Other reaction(s): Other (See Comments) Hallucinations     Nitroglycerin     Severe headache   Promethazine Other (See Comments)    Other reaction(s): Other (See Comments) Hallucinations     Chief Complaint  Patient presents with   Anticoagulation    PT/INR check, no bleeding and no missed doses.  Patient is requesting a medication review. Here with [name] (significant other). Discuss getting a rx for a sample inhaler that Dr.Miller gave.      HPI: Patient is a 75 y.o. male for INR.  INR 2.8 today, goal 2.5-3.5.  He has been taking coumadin 3.5 until last night and he only took 3 (thought dose was 3 mg vs 3.5 mg   He is going down on his sertraline. Has titrated down to 50 mg daily   Still having sleep issues. 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  HENT:  Negative for nosebleeds.   Respiratory:  Negative for cough, hemoptysis, sputum production and shortness of breath.   Cardiovascular:  Negative for chest pain, palpitations and leg swelling.  Gastrointestinal:  Negative for blood in stool.  Genitourinary:  Negative for dysuria, frequency and urgency.  Musculoskeletal:  Negative for back pain, falls, joint pain and myalgias.  Skin: Negative.   Neurological:  Negative for dizziness and headaches.  Psychiatric/Behavioral:  Negative for depression and memory loss. The patient has insomnia. The patient is not nervous/anxious.    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   Past Surgical History:  Procedure Laterality Date   AORTIC VALVE REPLACEMENT     mechanical   BIOPSY  03/28/2021   Procedure: BIOPSY;  Surgeon: Ronald Lobo, MD;  Location: [facility];  Service: Endoscopy;;   BLADDER SURGERY  1983   Bladder Cancer Surgery    ESOPHAGOGASTRODUODENOSCOPY (EGD) WITH PROPOFOL N/A 03/28/2021   Procedure: ESOPHAGOGASTRODUODENOSCOPY (EGD) WITH PROPOFOL;  Surgeon: Ronald Lobo, MD;  Location: [facility];  Service: Endoscopy;  Laterality: N/A;   KNEE SURGERY Right 1980   KNEE SURGERY Left 1982   OTHER SURGICAL HISTORY  1992   stomach muscles removed due to Coumadin caused bleeding.   TONSILLECTOMY  1965   Social History:   reports that he has never smoked. His smokeless tobacco use includes snuff. He reports that he does not currently use alcohol. He  reports that he does not use drugs.  Family History  Problem Relation Age of Onset   Heart disease Neg Hx     Medications: Patient's Medications  New Prescriptions   No medications on file  Previous Medications   ACETAMINOPHEN (TYLENOL) 500 MG TABLET    Take 500 mg by mouth daily as needed for moderate pain.   ALBUTEROL (VENTOLIN HFA) 108 (90 BASE) MCG/ACT INHALER    Inhale 1 puff into the lungs every 6 (six) hours as needed for wheezing or shortness of breath.   CHOLECALCIFEROL (VITAMIN D-3 PO)    Take 1 tablet by mouth 2 (two) times daily. Dose unknown   FOLIC ACID  (FOLVITE) 1 MG TABLET    Take 1 tablet (1 mg total) by mouth daily.   FUROSEMIDE (LASIX) 20 MG TABLET    Take 1 tablet (20 mg total) by mouth daily as needed for fluid or edema (3lb weight gain).   IRON, FERROUS SULFATE, 325 (65 FE) MG TABS    Take 325 mg by mouth in the morning and at bedtime.   LORAZEPAM (ATIVAN) 0.5 MG TABLET    Take one tablet by mouth in the morning and take two tablets by mouth in the evening.   LOVASTATIN (MEVACOR) 40 MG TABLET    Take 1 tablet (40 mg total) by mouth at bedtime.   PANTOPRAZOLE (PROTONIX) 40 MG TABLET    Take 1 tablet (40 mg total) by mouth 2 (two) times daily.   PROPYLENE GLYCOL (SYSTANE BALANCE) 0.6 % SOLN    Apply 1 drop to eye as needed (for dry eye).   RAMIPRIL (ALTACE) 10 MG CAPSULE    Take 1 capsule (10 mg total) by mouth daily.   SERTRALINE (ZOLOFT) 100 MG TABLET    Take 50 mg by mouth daily.   VITAMIN B-12 (CYANOCOBALAMIN) 1000 MCG TABLET    Take 1,000 mcg by mouth daily.   WARFARIN (COUMADIN) 1 MG TABLET    Take 1 mg by mouth daily. Along with a 2 mg tablet totaling 3 mg daily   WARFARIN (COUMADIN) 2 MG TABLET    Take 2 mg by mouth daily. Along with 1 mg totaling 3 mg daily  Modified Medications   No medications on file  Discontinued Medications   No medications on file    Physical Exam:  Vitals:   06/15/21 1310  BP: 128/76  Pulse: 81  Temp: 97.9 F (36.6 C)  TempSrc: Temporal  SpO2: 98%  Weight: 179 lb (81.2 kg)  Height: '5\' 9"'$  (1.753 m)   Body mass index is 26.43 kg/m. Wt Readings from Last 3 Encounters:  06/15/21 179 lb (81.2 kg)  06/05/21 178 lb (80.7 kg)  05/06/21 172 lb 6.4 oz (78.2 kg)    Physical Exam Constitutional:      General: He is not in acute distress.    Appearance: He is well-developed. He is not diaphoretic.  HENT:     Head: Normocephalic and atraumatic.     Mouth/Throat:     Pharynx: No oropharyngeal exudate.  Eyes:     Conjunctiva/sclera: Conjunctivae normal.     Pupils: Pupils are equal, round, and  reactive to light.  Cardiovascular:     Rate and Rhythm: Normal rate and regular rhythm.     Heart sounds: Murmur heard.  Pulmonary:     Effort: Pulmonary effort is normal.     Breath sounds: Rhonchi present.  Abdominal:     General: Bowel sounds are normal.  Palpations: Abdomen is soft.  Musculoskeletal:        General: No tenderness.     Cervical back: Normal range of motion and neck supple.     Right lower leg: No edema.     Left lower leg: No edema.  Skin:    General: Skin is warm and dry.  Neurological:     Mental Status: He is alert and oriented to person, place, and time.    Labs reviewed: Basic Metabolic Panel: Recent Labs    03/28/21 0905 04/18/21 1607 04/19/21 0438 04/20/21 0513 04/21/21 0213 04/27/21 1030  NA 138   < > 136 137 133* 136  K 4.2   < > 4.2 4.1 3.8 4.2  CL 109   < > 110 111 105 107  CO2 26   < > 21* '22 22 22  '$ GLUCOSE 94   < > 95 87 104* 96  BUN 16   < > 28* '18 18 16  '$ CREATININE 1.30*   < > 1.40* 1.40* 1.39* 1.19*  CALCIUM 8.1*   < > 8.5* 8.6* 8.7* 8.8  MG 1.7  --  1.8  --   --   --   PHOS 3.2  --  3.6  --   --   --    < > = values in this interval not displayed.   Liver Function Tests: Recent Labs    03/27/21 1532 04/18/21 1607 04/19/21 0438 04/27/21 1030  AST 17 15 12* 16  ALT '10 11 9 9  '$ ALKPHOS 78 77 67  --   BILITOT 0.5 0.5 0.5 0.3  PROT 6.7 5.5* 4.6* 5.6*  ALBUMIN 3.3* 2.9* 2.5*  --    No results for input(s): LIPASE, AMYLASE in the last 8760 hours. No results for input(s): AMMONIA in the last 8760 hours. CBC: Recent Labs    04/21/21 0213 04/27/21 1030 05/19/21 1159  WBC 7.3 5.8 5.3  NEUTROABS 5.2 3,706 3,535  HGB 9.3* 10.9* 11.7*  HCT 29.5* 35.4* 38.8  MCV 80.4 82.1 85.7  PLT 256 341 285   Lipid Panel: Recent Labs    03/09/21 1222  CHOL 185  HDL 67  LDLCALC 91  TRIG 171*  CHOLHDL 2.8   TSH: No results for input(s): TSH in the last 8760 hours. A1C: No results found for: HGBA1C   Assessment/Plan 1.  Anticoagulant long-term use -INR 2.8 today, goal 2.5-3.5.  - POC INR - warfarin (COUMADIN) 1 MG tablet; Take 0.5 tablets (0.5 mg total) by mouth daily. Along with a 3 mg tablet totaling 3.5 mg daily  Dispense: 30 tablet; Refill: 1 - warfarin (COUMADIN) 3 MG tablet; Take 1 tablet (3 mg total) by mouth daily. Add to 0.5 mg to equal 3.5 mg coumadin daily  Dispense: 30 tablet; Refill: 1  2. Primary insomnia -continue to work on lifestyle modifications. Will start trazodone 25 mg by mouth at bedtime for sleep.  - traZODone (DESYREL) 50 MG tablet; Take 0.5 tablets (25 mg total) by mouth at bedtime.  Dispense: 30 tablet; Refill: 3  3. Anxiety Doing well with titration of zoloft. Will continue to wean off medication at this time. To decrease zoloft to 25 mg by mouth daily for 2 weeks then 12.5 mg by mouth twice daily x 2 weeks then stop.  - sertraline (ZOLOFT) 25 MG tablet; Take 1 tablet (25 mg total) by mouth daily.  Dispense: 30 tablet; Refill: 1  4. Essential hypertension Controlled on ramipril- taking 2 tablets daily. Will update  prescription - ramipril (ALTACE) 10 MG capsule; Take 2 capsules (20 mg total) by mouth daily.  Dispense: 180 capsule; Refill: 1  5. Fungal toenail infection - Ambulatory referral to Podiatry  6. H/O aortic valve replacement -continues on coumadin for anticoagulation. Goal 2.5-3.5  Next appt: 2 weeks.  Carlos American. [place], [facility] [phone]

## 2021-06-29 ENCOUNTER — Ambulatory Visit: Payer: Medicare Other | Admitting: Podiatry

## 2021-07-01 ENCOUNTER — Other Ambulatory Visit: Payer: Self-pay

## 2021-07-01 ENCOUNTER — Encounter: Payer: Self-pay | Admitting: Nurse Practitioner

## 2021-07-01 ENCOUNTER — Ambulatory Visit (INDEPENDENT_AMBULATORY_CARE_PROVIDER_SITE_OTHER): Payer: Medicare Other | Admitting: Nurse Practitioner

## 2021-07-01 VITALS — BP 126/78 | HR 74 | Temp 97.9°F | Ht 69.0 in | Wt 180.0 lb

## 2021-07-01 DIAGNOSIS — Z952 Presence of prosthetic heart valve: Secondary | ICD-10-CM | POA: Diagnosis not present

## 2021-07-01 DIAGNOSIS — F5101 Primary insomnia: Secondary | ICD-10-CM

## 2021-07-01 DIAGNOSIS — F419 Anxiety disorder, unspecified: Secondary | ICD-10-CM

## 2021-07-01 DIAGNOSIS — Z7901 Long term (current) use of anticoagulants: Secondary | ICD-10-CM

## 2021-07-01 DIAGNOSIS — Z23 Encounter for immunization: Secondary | ICD-10-CM

## 2021-07-01 LAB — POCT INR: INR: 2.8 (ref 2.0–3.0)

## 2021-07-01 NOTE — Progress Notes (Signed)
Careteam: Patient Care Team: Garrett Chandler, NP as PCP - General (Geriatric Medicine)  PLACE OF SERVICE:  [facility]  Advanced Directive information    Allergies  Allergen Reactions   Aspirin     Other reaction(s): Other (See Comments) Can't take due to taking blood thinners   Morphine And Related Other (See Comments)    Other reaction(s): Other (See Comments) Hallucinations     Nitroglycerin     Severe headache   Promethazine Other (See Comments)    Other reaction(s): Other (See Comments) Hallucinations     Chief Complaint  Patient presents with   Anticoagulation    PT/INR check and follow up on sleep concerns.    Quality Metric Gaps    Discuss need for hep c screening, shingrix, colonoscopy, covid #4 vaccine or exclude. Flu vaccine today.      HPI: Patient is a 75 y.o. male for INR follow up. Has been on coumadin 3.5 mg daily for 1 month. No recent changes in medication.   Weaning off zolft at this time. Feeling ok. No overwhelming anxiety or depression noted.   When he is able to go to sleep he sleeps well.  Thoughts can over take him. Will get up and stay up until 3-4 in the morning.  This has lessened but still going on 1-2 tmes a week.  He is out of his routine. Feels like when that starts back his restless will improve.  Hoping boot comes off Friday and can increase physical activity.   Review of Systems:  Review of Systems  Constitutional:  Negative for chills, fever and weight loss.  HENT:  Negative for tinnitus.   Respiratory:  Negative for cough, sputum production and shortness of breath.   Cardiovascular:  Negative for chest pain, palpitations and leg swelling.  Gastrointestinal:  Negative for abdominal pain, constipation, diarrhea and heartburn.  Genitourinary:  Negative for dysuria, frequency and urgency.  Musculoskeletal:  Negative for back pain, falls, joint pain and myalgias.  Skin: Negative.   Neurological:  Negative for dizziness  and headaches.  Psychiatric/Behavioral:  Negative for depression and memory loss. The patient has insomnia.    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   Past Surgical History:  Procedure Laterality Date   AORTIC VALVE REPLACEMENT     mechanical   BIOPSY  03/28/2021   Procedure: BIOPSY;  Surgeon: Ronald Lobo, MD;  [facility];  Service: Endoscopy;;   BLADDER SURGERY  1983   Bladder Cancer Surgery    ESOPHAGOGASTRODUODENOSCOPY (EGD) WITH PROPOFOL N/A 03/28/2021   Procedure: ESOPHAGOGASTRODUODENOSCOPY (EGD) WITH PROPOFOL;  Surgeon: Ronald Lobo, MD;  Location[facility];  Service: Endoscopy;  Laterality: N/A;   KNEE SURGERY Right 1980   KNEE SURGERY Left 1982   OTHER SURGICAL HISTORY  1992   stomach muscles removed due to Coumadin caused bleeding.   TONSILLECTOMY  1965   Social History:   reports that he has never smoked. His smokeless tobacco use includes snuff. He reports that he does not currently use alcohol. He reports that he does not use drugs.  Family History  Problem Relation Age of Onset   Heart disease Neg Hx     Medications: Patient's Medications  New Prescriptions   No medications on file  Previous Medications   ACETAMINOPHEN (TYLENOL) 500 MG TABLET    Take 500 mg by mouth daily as needed for moderate pain.   ALBUTEROL (VENTOLIN HFA) 108 (90 BASE) MCG/ACT INHALER    Inhale 1 puff into the lungs every 6 (six) hours as needed for wheezing or shortness  of breath.   CHOLECALCIFEROL (VITAMIN D-3 PO)    Take 1 tablet by mouth 2 (two) times daily. Dose unknown   FOLIC ACID (FOLVITE) 1 MG TABLET    Take 1 tablet (1 mg total) by mouth daily.   FUROSEMIDE (LASIX) 20 MG TABLET    Take 1 tablet (20 mg total) by mouth daily as needed for fluid or edema (3lb weight gain).   IRON, FERROUS SULFATE, 325 (65 FE) MG TABS    Take 325 mg by mouth in the morning and at bedtime.   LORAZEPAM (ATIVAN) 0.5 MG TABLET    Take one tablet by mouth in the morning and take two tablets by mouth in the evening.   LOVASTATIN (MEVACOR) 40 MG TABLET    Take 1 tablet (40 mg total) by mouth at bedtime.   PANTOPRAZOLE (PROTONIX) 40 MG TABLET    Take 1 tablet (40 mg total) by mouth 2 (two) times daily.   PROPYLENE GLYCOL (SYSTANE BALANCE) 0.6 % SOLN    Apply 1 drop to eye as needed (for dry eye).   RAMIPRIL (ALTACE) 10 MG CAPSULE    Take 2 capsules (20 mg total) by mouth daily.   SERTRALINE (ZOLOFT) 25 MG TABLET    Take 1 tablet (25 mg total) by mouth daily.   TRAZODONE (DESYREL) 50 MG TABLET    Take 0.5 tablets (25 mg total) by mouth at bedtime.   VITAMIN B-12 (CYANOCOBALAMIN) 1000 MCG TABLET    Take 1,000 mcg by mouth daily.   WARFARIN (COUMADIN) 1 MG TABLET    Take 0.5 tablets (0.5 mg total) by mouth daily. Along with a 3 mg tablet totaling 3.5 mg daily   WARFARIN (COUMADIN) 3 MG TABLET    Take 1 tablet (3 mg total) by mouth daily. Add to 0.5 mg to equal 3.5 mg coumadin daily  Modified Medications   No medications on file  Discontinued Medications   No medications on file    Physical Exam:  Vitals:   07/01/21 1313  BP: 126/78  Pulse: 74  Temp: 97.9 F (36.6 C)  TempSrc: Temporal  SpO2: 96%  Weight: 180 lb (81.6 kg)  Height: '5\' 9"'$  (1.753 m)   Body mass index is 26.58 kg/m. Wt Readings from Last 3 Encounters:  07/01/21 180 lb (81.6 kg)  06/15/21 179 lb (81.2 kg)  06/05/21 178 lb (80.7 kg)    Physical Exam Constitutional:      General: He is not in acute  distress.    Appearance: He is well-developed. He is not diaphoretic.  HENT:     Head: Normocephalic and atraumatic.     Right Ear: External ear normal.     Left Ear: External ear normal.     Mouth/Throat:     Pharynx: No oropharyngeal  exudate.  Eyes:     Conjunctiva/sclera: Conjunctivae normal.     Pupils: Pupils are equal, round, and reactive to light.  Cardiovascular:     Rate and Rhythm: Normal rate and regular rhythm.     Heart sounds: Murmur heard.  Pulmonary:     Effort: Pulmonary effort is normal.     Breath sounds: Normal breath sounds.  Abdominal:     General: Bowel sounds are normal.     Palpations: Abdomen is soft.  Musculoskeletal:        General: No tenderness.     Cervical back: Normal range of motion and neck supple.     Right lower leg: No edema.     Left lower leg: No edema.  Skin:    General: Skin is warm and dry.  Neurological:     Mental Status: He is alert and oriented to person, place, and time.    Labs reviewed: Basic Metabolic Panel: Recent Labs    03/28/21 0905 04/18/21 1607 04/19/21 0438 04/20/21 0513 04/21/21 0213 04/27/21 1030  NA 138   < > 136 137 133* 136  K 4.2   < > 4.2 4.1 3.8 4.2  CL 109   < > 110 111 105 107  CO2 26   < > 21* '22 22 22  '$ GLUCOSE 94   < > 95 87 104* 96  BUN 16   < > 28* '18 18 16  '$ CREATININE 1.30*   < > 1.40* 1.40* 1.39* 1.19*  CALCIUM 8.1*   < > 8.5* 8.6* 8.7* 8.8  MG 1.7  --  1.8  --   --   --   PHOS 3.2  --  3.6  --   --   --    < > = values in this interval not displayed.   Liver Function Tests: Recent Labs    03/27/21 1532 04/18/21 1607 04/19/21 0438 04/27/21 1030  AST 17 15 12* 16  ALT '10 11 9 9  '$ ALKPHOS 78 77 67  --   BILITOT 0.5 0.5 0.5 0.3  PROT 6.7 5.5* 4.6* 5.6*  ALBUMIN 3.3* 2.9* 2.5*  --    No results for input(s): LIPASE, AMYLASE in the last 8760 hours. No results for input(s): AMMONIA in the last 8760 hours. CBC: Recent Labs    04/21/21 0213 04/27/21 1030 05/19/21 1159  WBC 7.3  5.8 5.3  NEUTROABS 5.2 3,706 3,535  HGB 9.3* 10.9* 11.7*  HCT 29.5* 35.4* 38.8  MCV 80.4 82.1 85.7  PLT 256 341 285   Lipid Panel: Recent Labs    03/09/21 1222  CHOL 185  HDL 67  LDLCALC 91  TRIG 171*  CHOLHDL 2.8   TSH: No results for input(s): TSH in the last 8760 hours. A1C: No results found for: HGBA1C   Assessment/Plan 1. Anticoagulant long-term use - POC INR- stable at 2.8 on coumadin 3.5 mg daily. No abnormal bruising or bleeding.   2. Need for influenza vaccination - Flu Vaccine QUAD High Dose(Fluad)  3. H/O aortic valve replacement -continues on coumadin for anticoagulation, taking 3.5 mg by mouth daily  Goal 2.5-3.5, currently 2.8 - Ambulatory referral to Cardiology  4. Primary insomnia Ongoing, has not started trazodone at this time, can use PRN, if not helping then start daily at bedtime.  5. Anxiety Continues to wean zoloft. Doing well with this. No increase in anxiety   Next appt: 4 weeks Keavon Sensing K. [place], [facility] [phone]

## 2021-07-07 ENCOUNTER — Other Ambulatory Visit: Payer: Self-pay | Admitting: Nurse Practitioner

## 2021-07-07 ENCOUNTER — Telehealth: Payer: Self-pay | Admitting: *Deleted

## 2021-07-07 DIAGNOSIS — F419 Anxiety disorder, unspecified: Secondary | ICD-10-CM

## 2021-07-07 MED ORDER — LORAZEPAM 0.5 MG PO TABS: 0.5000 mg | ORAL_TABLET | Freq: Two times a day (BID) | ORAL | 0 refills | Status: DC | PRN

## 2021-07-07 NOTE — Telephone Encounter (Signed)
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  Patient is calling wanting to know if it ok to use this.  Please Advise.

## 2021-07-07 NOTE — Telephone Encounter (Signed)
Yes it is ok

## 2021-07-07 NOTE — Telephone Encounter (Signed)
Pharmacy sent lorazepam request for patient. Patients next appointment is 07/27/2021, pt will need to sign treatment agreement at that time.

## 2021-07-07 NOTE — Telephone Encounter (Signed)
Called pt to discuss reducing medication- would like to decrease to 1 tablet twice daily at this time.  Left voicemail for him to call office with any questions are concerns.

## 2021-07-07 NOTE — Telephone Encounter (Signed)
Patient notified and agreed.  

## 2021-07-08 ENCOUNTER — Other Ambulatory Visit: Payer: Self-pay | Admitting: *Deleted

## 2021-07-08 DIAGNOSIS — F419 Anxiety disorder, unspecified: Secondary | ICD-10-CM

## 2021-07-08 MED ORDER — LORAZEPAM 0.5 MG PO TABS: ORAL_TABLET | ORAL | 0 refills | Status: DC

## 2021-07-08 NOTE — Telephone Encounter (Signed)
Patient is aware of the decrease.

## 2021-07-08 NOTE — Telephone Encounter (Signed)
[facility] called and stated that they received a Refill request for Lorazepam with directions stating: Take one tablet by mouth two times daily as needed for anxiety. Take one in the morning and Two in the afternoon.   Pharmacy is wanting to know what is the correct dosage.    Reviewed message from Yesterday: [name], NP   4:28 PM Note Called pt to discuss reducing medication- would like to decrease to 1 tablet twice daily at this time.  Left voicemail for him to call office with any questions are concerns.        Corrected Rx and Pended and sent to [facility] for approval.

## 2021-07-14 ENCOUNTER — Ambulatory Visit (INDEPENDENT_AMBULATORY_CARE_PROVIDER_SITE_OTHER): Payer: Medicare Other | Admitting: Family

## 2021-07-14 ENCOUNTER — Encounter: Payer: Self-pay | Admitting: Family

## 2021-07-14 ENCOUNTER — Other Ambulatory Visit: Payer: Self-pay

## 2021-07-14 VITALS — BP 110/70 | HR 72 | Temp 97.8°F | Resp 16 | Ht 69.0 in | Wt 178.8 lb

## 2021-07-14 DIAGNOSIS — H6122 Impacted cerumen, left ear: Secondary | ICD-10-CM | POA: Diagnosis not present

## 2021-07-14 DIAGNOSIS — H65193 Other acute nonsuppurative otitis media, bilateral: Secondary | ICD-10-CM | POA: Diagnosis not present

## 2021-07-14 MED ORDER — DEBROX 6.5 % OT SOLN: 5.0000 [drp] | Freq: Two times a day (BID) | OTIC | 0 refills | Status: DC

## 2021-07-14 MED ORDER — LORATADINE 10 MG PO TABS: 10.0000 mg | ORAL_TABLET | Freq: Every day | ORAL | 11 refills | Status: DC

## 2021-07-14 NOTE — Patient Instructions (Addendum)
Instill debrox 6.5 otic solution 5 drops into each ear twice daily x 4 days.May apply cotton ball at bedtime to prevent drainage to pillow.  Otitis Media With Effusion, Adult Otitis media with effusion (OME) is inflammation and fluid (effusion) in the middle ear without having an ear infection. The middle ear is the space behind the eardrum. The middle ear is connected to the back of the throat by a narrow tube (eustachian tube). Normally the eustachian tube drains fluid out of the middle ear. A swollen eustachian tube can become blocked and cause fluid to collect in the middle ear. OME often goes away without treatment. Sometimes OME can lead to hearing problems and recurrent acute ear infections (acute otitis media). These conditions may require treatment. What are the causes? OME is caused by a blocked eustachian tube. This can result from: Allergies. Upper respiratory infections. Enlarged adenoids. The adenoids are areas of soft tissue located high in the back of the throat, behind the nose and the roof of the mouth. They are part of the body's natural defense system (immune system). Rapid changes in pressure, like when an airplane is descending or during scuba diving. In some cases, the cause of this condition is not known. What are the signs or symptoms? Common symptoms of this condition include: A feeling of fullness in your ear. Decreased hearing in the affected ear. Fluid draining into the ear canal. Pain in the ear. In some cases, there are no symptoms. How is this diagnosed? A health care provider can diagnose OME based on signs and symptoms of the condition. Your provider will also do a physical exam to check for fluid behind the eardrum. During the exam, your health care provider will use an instrument called an otoscope to look in your ear. Your health care provider may do other tests, such as: A hearing test. A tympanogram. This is a test that shows how well the eardrum moves  in response to air pressure in the ear canal. It provides a graph for your health care provider to review. A pneumatic otoscopy. This is a test to check how your eardrum moves in response to changes in pressure. It is done by squeezing a small amount of air into the ear. How is this treated? Treatment for OME depends on the cause of the condition and the severity of symptoms. The first step is often waiting to see if the fluid drains on its own in a few weeks. Home care treatment may include: Over-the-counter pain relievers. A warm, moist cloth placed over the ear. Severe cases may require a procedure to insert tubes in the ears (tympanostomy tubes) to drain the fluid. Follow these instructions at home: Take over-the-counter and prescription medicines only as told by your health care provider. Keep all follow-up visits. Contact a health care provider if: You have pain that gets worse. Hearing in your affected ear gets worse. You have fluid draining from your ear canal. You have dizziness. You develop a fever. Get help right away if: You develop a severe headache. You completely lose hearing in the affected ear. You have bleeding from your ear canal. You have sudden and severe pain in your ear. These symptoms may represent a serious problem that is an emergency. Do not wait to see if the symptoms will go away. Get medical help right away. Call your local emergency services (911 in the [place] Do not drive yourself to the hospital. Summary Otitis media with effusion (OME) is inflammation and fluid (  effusion) in the middle ear without having an ear infection. A swollen eustachian tube can become blocked and cause fluid to collect in the middle ear. Treatment for OME depends on the cause of the condition and the severity of symptoms. Many times, treatment is not needed because the fluid drains on its own in a few weeks. Sometimes OME can lead to hearing problems and recurrent acute ear  infections (acute otitis media), which may require treatment. This information is not intended to replace advice given to you by your health care provider. Make sure you discuss any questions you have with your health care provider. Document Revised: 02/12/2021 Document Reviewed: 02/12/2021 Elsevier Patient Education  [employer].

## 2021-07-14 NOTE — Progress Notes (Signed)
Provider: Moise Friday FNP-C  [name], NP  Patient Care Team: [name], NP as PCP - General (Geriatric Medicine)  Extended Emergency Contact Information Primary Emergency Contact: [name] Mobile Phone: [phone] Relation: Significant other Interpreter needed? No  Code Status:  Full Code  Goals of care: Advanced Directive information Advanced Directives 07/14/2021  Does Patient Have a Medical Advance Directive? No  Type of Advance Directive -  Does patient want to make changes to medical advance directive? -  Copy of [place] in Chart? -  Would patient like information on creating a medical advance directive? No - Patient declined     Chief Complaint  Patient presents with   Acute Visit    Patient wants ears checked and possible ear lavage.     HPI:  Pt is a 75 y.o. male seen today for an acute visit for evaluation of bilateral ear states was seen by Audiologist thinks has ear wax need lavage.Recently got hearing aids but whenever he wear them felt like " He had something in the ear with weird sound" Agree to butterfly sound in the ear.He denies any pain,ringing or drainage from ear.No recent URI's but does have chronic allergies.Also denies any fever or chills.    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   Past Surgical History:  Procedure Laterality Date   AORTIC VALVE REPLACEMENT     mechanical   BIOPSY  03/28/2021    Procedure: BIOPSY;  Surgeon: Ronald Lobo, MD;  Location: [facility];  Service: Endoscopy;;   BLADDER SURGERY  1983   Bladder Cancer Surgery    ESOPHAGOGASTRODUODENOSCOPY (EGD) WITH PROPOFOL N/A 03/28/2021   Procedure: ESOPHAGOGASTRODUODENOSCOPY (EGD) WITH PROPOFOL;  Surgeon: Ronald Lobo, MD;  Location: [facility];  Service: Endoscopy;  Laterality: N/A;   KNEE SURGERY Right 1980   KNEE SURGERY Left 1982   OTHER SURGICAL HISTORY  1992   stomach muscles removed due to Coumadin caused bleeding.   TONSILLECTOMY  1965    Allergies  Allergen Reactions   Aspirin     Other reaction(s): Other (See Comments) Can't take due to taking blood thinners   Morphine And Related Other (See Comments)    Other reaction(s): Other (See Comments) Hallucinations     Nitroglycerin     Severe headache   Promethazine Other (See Comments)    Other reaction(s): Other (See Comments) Hallucinations     Outpatient Encounter Medications as of 07/14/2021  Medication Sig   acetaminophen (TYLENOL) 500 MG tablet Take 500 mg by mouth daily as needed for moderate pain.   albuterol (VENTOLIN HFA) 108 (90 Base) MCG/ACT inhaler Inhale 1 puff into the lungs every 6 (six) hours as needed for wheezing or shortness of breath.   carbamide peroxide (DEBROX) 6.5 % OTIC solution Place 5 drops into the left ear 2 (two) times daily.   Cholecalciferol (VITAMIN  D-3 PO) Take 1 tablet by mouth 2 (two) times daily. Dose unknown   folic acid (FOLVITE) 1 MG tablet Take 1 tablet (1 mg total) by mouth daily.   furosemide (LASIX) 20 MG tablet Take 1 tablet (20 mg total) by mouth daily as needed for fluid or edema (3lb weight gain).   Iron, Ferrous Sulfate, 325 (65 Fe) MG TABS Take 325 mg by mouth in the morning and at bedtime.   loratadine (CLARITIN) 10 MG tablet Take 1 tablet (10 mg total) by mouth daily.   LORazepam (ATIVAN) 0.5 MG tablet Take one tablet by mouth twice daily as needed for anxiety.   lovastatin (MEVACOR) 40 MG  tablet Take 1 tablet (40 mg total) by mouth at bedtime.   pantoprazole (PROTONIX) 40 MG tablet Take 1 tablet (40 mg total) by mouth 2 (two) times daily.   Propylene Glycol (SYSTANE BALANCE) 0.6 % SOLN Apply 1 drop to eye as needed (for dry eye).   ramipril (ALTACE) 10 MG capsule Take 2 capsules (20 mg total) by mouth daily.   sertraline (ZOLOFT) 25 MG tablet Take 1 tablet (25 mg total) by mouth daily.   traZODone (DESYREL) 50 MG tablet Take 0.5 tablets (25 mg total) by mouth at bedtime.   vitamin B-12 (CYANOCOBALAMIN) 1000 MCG tablet Take 1,000 mcg by mouth daily.   warfarin (COUMADIN) 1 MG tablet Take 0.5 tablets (0.5 mg total) by mouth daily. Along with a 3 mg tablet totaling 3.5 mg daily   warfarin (COUMADIN) 3 MG tablet Take 1 tablet (3 mg total) by mouth daily. Add to 0.5 mg to equal 3.5 mg coumadin daily   No facility-administered encounter medications on file as of 07/14/2021.    Review of Systems  Constitutional:  Negative for appetite change, chills, fatigue, fever and unexpected weight change.  HENT:  Positive for hearing loss. Negative for congestion, dental problem, ear discharge, ear pain, facial swelling, nosebleeds, postnasal drip, rhinorrhea, sinus pressure, sinus pain, sneezing, sore throat and tinnitus.   Eyes:  Negative for pain, discharge, redness, itching and visual disturbance.  Respiratory:  Negative for cough, chest tightness, shortness of breath and wheezing.   Cardiovascular:  Negative for chest pain, palpitations and leg swelling.  Neurological:  Negative for dizziness, speech difficulty, light-headedness and headaches.   Immunization History  Administered Date(s) Administered   Fluad Quad(high Dose 65+) 07/01/2021   Influenza Inj Mdck Quad Pf 07/08/2016   Influenza Split 08/06/2011, 08/14/2012, 07/20/2013, 07/19/2014   Influenza, High Dose Seasonal PF 07/26/2017, 07/23/2019   Influenza, Seasonal, Injecte, Preservative Fre 07/28/2015   Influenza,inj,Quad PF,6+  Mos 08/21/2018, 08/11/2020   PFIZER(Purple Top)SARS-COV-2 Vaccination 11/01/2019, 11/20/2019, 09/24/2020   Pneumococcal Conjugate-13 07/19/2014   Pneumococcal Polysaccharide-23 07/28/2015   Tdap 03/20/2020   Pertinent  Health Maintenance Due  Topic Date Due   COLONOSCOPY (Pts 45-69yr Insurance coverage will need to be confirmed)  Never done   INFLUENZA VACCINE  Completed   PNA vac Low Risk Adult  Completed   Fall Risk  07/14/2021 05/06/2021 03/09/2021  Falls in the past year? 0 0 1  Number falls in past yr: 0 0 1  Injury with Fall? 0 0 0  Risk for fall due to : No Fall Risks No Fall Risks History of fall(s)  Follow up Falls evaluation completed Falls evaluation completed;Education provided;Falls prevention discussed -   Functional Status Survey:    There were no vitals filed for this visit. There is no height or weight on file to calculate BMI.  Physical Exam Vitals reviewed.  Constitutional:      General: He is not in acute distress.    Appearance: Normal appearance. He is normal weight. He is not ill-appearing or diaphoretic.  HENT:     Head: Normocephalic.     Right Ear: No drainage, swelling or tenderness. A middle ear effusion is present. Tympanic membrane is not injected, scarred, perforated, retracted or bulging.     Left Ear: No drainage, swelling or tenderness. A middle ear effusion is present. Tympanic membrane is scarred. Tympanic membrane is not injected, perforated, retracted or bulging.     Ears:     Comments: Bilateral ear partial cerumen impaction     Nose: Nose normal. No congestion or rhinorrhea.     Mouth/Throat:     Mouth: Mucous membranes are moist.     Pharynx: Oropharynx is clear. No oropharyngeal exudate or posterior oropharyngeal erythema.  Eyes:     General: No scleral icterus.       Right eye: No discharge.        Left eye: No discharge.     Extraocular Movements: Extraocular movements intact.     Conjunctiva/sclera: Conjunctivae normal.     Pupils:  Pupils are equal, round, and reactive to light.  Neck:     Vascular: No carotid bruit.  Cardiovascular:     Rate and Rhythm: Normal rate and regular rhythm.     Pulses: Normal pulses.     Heart sounds: Normal heart sounds. No murmur heard.   No friction rub. No gallop.  Pulmonary:     Effort: Pulmonary effort is normal. No respiratory distress.     Breath sounds: Normal breath sounds. No wheezing, rhonchi or rales.  Chest:     Chest wall: No tenderness.  Abdominal:     General: Bowel sounds are normal. There is no distension.     Palpations: Abdomen is soft. There is no mass.     Tenderness: There is no abdominal tenderness. There is no right CVA tenderness, left CVA tenderness, guarding or rebound.  Musculoskeletal:        General: No swelling or tenderness. Normal range of motion.     Cervical back: Normal range of motion. No rigidity or tenderness.     Right lower leg: No edema.     Left lower leg: No edema.  Lymphadenopathy:     Cervical: No cervical adenopathy.  Skin:    General: Skin is warm and dry.     Coloration: Skin is not pale.     Findings: No bruising, erythema, lesion or rash.  Neurological:     Mental Status: He is alert and oriented to person, place, and time.     Cranial Nerves: No cranial nerve deficit.     Sensory: No sensory deficit.     Motor: No weakness.     Coordination: Coordination normal.     Gait: Gait normal.  Psychiatric:        Mood and Affect: Mood normal.        Speech: Speech normal.        Behavior: Behavior normal.        Thought Content: Thought content normal.        Judgment: Judgment normal.    Labs reviewed: Recent Labs    03/28/21 0905 04/18/21 1607 04/19/21 0438 04/20/21 0513 04/21/21 0213 04/27/21 1030  NA 138   < > 136 137 133* 136  K 4.2   < > 4.2 4.1 3.8 4.2  CL 109   < > 110 111 105 107  CO2 26   < > 21* '22 22 22  '$ GLUCOSE 94   < > 95 87 104* 96  BUN 16   < > 28* '18 18 16  '$ CREATININE 1.30*   < > 1.40* 1.40*  1.39* 1.19*  CALCIUM 8.1*   < > 8.5* 8.6* 8.7* 8.8  MG 1.7  --  1.8  --   --   --   PHOS 3.2  --  3.6  --   --   --    < > = values in this interval not displayed.   Recent Labs    03/27/21 1532 04/18/21 1607 04/19/21 0438 04/27/21 1030  AST 17 15 12* 16  ALT '10 11 9 9  '$ ALKPHOS 78 77 67  --   BILITOT 0.5 0.5 0.5 0.3  PROT 6.7 5.5* 4.6* 5.6*  ALBUMIN 3.3* 2.9* 2.5*  --    Recent Labs    04/21/21 0213 04/27/21 1030 05/19/21 1159  WBC 7.3 5.8 5.3  NEUTROABS 5.2 3,706 3,535  HGB 9.3* 10.9* 11.7*  HCT 29.5* 35.4* 38.8  MCV 80.4 82.1 85.7  PLT 256 341 285   No results found for: TSH No results found for: HGBA1C Lab Results  Component Value Date   CHOL 185 03/09/2021   HDL 67 03/09/2021   LDLCALC 91 03/09/2021   TRIG 171 (H) 03/09/2021   CHOLHDL 2.8 03/09/2021    Significant Diagnostic Results in last 30 days:  No results found.  Assessment/Plan 1. Acute MEE (middle ear effusion), bilateral Bilateral effusion noted.chronic allergies. Advised to take loratadine as below.  - loratadine (CLARITIN) 10 MG tablet; Take 1 tablet (10 mg total) by mouth daily.  Dispense: 30 tablet; Refill: 11  2. Impacted cerumen of left ear Bilateral ear partial cerumen impaction.cerumen removed with curette but hard cerumen noted on left ear advised to instil debrox as below.Has appointment with PCP 07/27/2021 to reevaluate during visit.   - carbamide peroxide (DEBROX) 6.5 % OTIC solution; Place 5 drops into the left ear 2 (two) times daily.  Dispense: 15 mL; Refill: 0  Family/ staff Communication: Reviewed plan of care with patient and wife verbalized understanding   Labs/tests ordered: None   Next Appointment: Has appointment 07/27/2021   Sandrea Hughs, NP

## 2021-07-26 LAB — COLOGUARD: COLOGUARD: POSITIVE — AB

## 2021-07-27 ENCOUNTER — Ambulatory Visit (INDEPENDENT_AMBULATORY_CARE_PROVIDER_SITE_OTHER): Payer: Medicare Other | Admitting: Nurse Practitioner

## 2021-07-27 ENCOUNTER — Other Ambulatory Visit: Payer: Self-pay

## 2021-07-27 ENCOUNTER — Encounter: Payer: Self-pay | Admitting: Nurse Practitioner

## 2021-07-27 VITALS — BP 122/74 | HR 91 | Temp 97.9°F | Ht 69.0 in | Wt 179.0 lb

## 2021-07-27 DIAGNOSIS — K5903 Drug induced constipation: Secondary | ICD-10-CM

## 2021-07-27 DIAGNOSIS — M8949 Other hypertrophic osteoarthropathy, multiple sites: Secondary | ICD-10-CM

## 2021-07-27 DIAGNOSIS — F5101 Primary insomnia: Secondary | ICD-10-CM

## 2021-07-27 DIAGNOSIS — Z1159 Encounter for screening for other viral diseases: Secondary | ICD-10-CM | POA: Diagnosis not present

## 2021-07-27 DIAGNOSIS — D62 Acute posthemorrhagic anemia: Secondary | ICD-10-CM

## 2021-07-27 DIAGNOSIS — M159 Polyosteoarthritis, unspecified: Secondary | ICD-10-CM

## 2021-07-27 DIAGNOSIS — K219 Gastro-esophageal reflux disease without esophagitis: Secondary | ICD-10-CM

## 2021-07-27 DIAGNOSIS — Z952 Presence of prosthetic heart valve: Secondary | ICD-10-CM

## 2021-07-27 DIAGNOSIS — Z7901 Long term (current) use of anticoagulants: Secondary | ICD-10-CM

## 2021-07-27 DIAGNOSIS — R1310 Dysphagia, unspecified: Secondary | ICD-10-CM | POA: Diagnosis not present

## 2021-07-27 LAB — POCT INR: INR: 2.9 (ref 2.0–3.0)

## 2021-07-27 MED ORDER — PANTOPRAZOLE SODIUM 40 MG PO TBEC: 40.0000 mg | DELAYED_RELEASE_TABLET | Freq: Two times a day (BID) | ORAL | 3 refills | Status: DC

## 2021-07-27 NOTE — Patient Instructions (Addendum)
[facility] [phone] to make appt with [facility]   Restart protonix.   Wean off ativan routine use- to use only for severe anxiety or panic.   Continue coumadin as prescribed.   Use Voltaren 4 times daily for hand pain.   Take stool softener with iron if causing constipation.

## 2021-07-27 NOTE — Progress Notes (Signed)
Careteam: Patient Care Team: [name], NP as PCP - General (Geriatric Medicine)  PLACE OF SERVICE:  [place] Directive information Does Patient Have a Medical Advance Directive?: No, Would patient like information on creating a medical advance directive?: No - Patient declined  Allergies  Allergen Reactions   Aspirin     Other reaction(s): Other (See Comments) Can't take due to taking blood thinners   Morphine And Related Other (See Comments)    Other reaction(s): Other (See Comments) Hallucinations     Nitroglycerin     Severe headache   Promethazine Other (See Comments)    Other reaction(s): Other (See Comments) Hallucinations     Chief Complaint  Patient presents with   Medical Management of Chronic Issues    3 month follow-up. Discuss need for hep c screening, colonoscopy, shingrix, and covid #4 or exclude. Sign treatment agreement for lorazepam. Decrease energy level. Discuss taking Lasix on a daily basis. Increased arthritic pain, tried water exercise and it makes it worse . Request to examine ears, right ear has an ocean wave sound. Patient with reoccurring swallowing issues and scratchy throat at night. Discuss if patient should continue with folic acid. Moderate fall risk. Here with [name].    Anticoagulation    PT/INR check     HPI: Patient is a 74 y.o. male for routine follow up.   Cologuard results pending.   Sleeping comes and goes. Some evening he is able to sleep and others he has trouble. He did not stay on trazodone.   He is having issues with scratchy throat and vomiting.  He had endoscopy and had to have dilation  Now he is having trouble swallowing foods, chews food well but continues to get suck. 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  Continues on protonix twice daily   Anxiety- he is using lorazepam scheduled.  He has titrated off sertraline.   Reports  arthritis in shoulder and hands. Reports he has more pain when drop the the temperature. Does not remember having that much trouble with drop in temperature in the past.   [facility] and into pool and power walking.  Years ago he remembers being told to be weary of hot tubs and steam baths and whirlpools because of bacteria that could get into his heart valve.  Reports he feels like it is not helping. Pain is worsening in hands, muscles, knees, ankles.  At times he can not even bend fingers, finger will get stuck.  He has had a lot of hand injuries in the past.     Review of Systems:  Review of Systems  Constitutional:  Negative for chills, fever and weight loss.  HENT:  Negative for tinnitus.   Respiratory:  Negative for cough, sputum production and shortness of breath.   Cardiovascular:  Negative for chest pain, palpitations and leg swelling.  Gastrointestinal:  Negative for abdominal pain, constipation, diarrhea and heartburn.  Genitourinary:  Negative for dysuria, frequency and urgency.  Musculoskeletal:  Positive for joint pain and myalgias. Negative for back pain and falls.  Skin: Negative.   Neurological:  Negative for dizziness and headaches.  Psychiatric/Behavioral:  Negative for depression and memory loss. The patient has insomnia. The patient is not nervous/anxious.    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   Past Surgical History:   Procedure Laterality Date   AORTIC VALVE REPLACEMENT     mechanical   BIOPSY  03/28/2021   Procedure: BIOPSY;  Surgeon: Ronald Lobo, MD;  [facility];  Service: Endoscopy;;   BLADDER SURGERY  1983   Bladder Cancer Surgery    ESOPHAGOGASTRODUODENOSCOPY (EGD) WITH PROPOFOL N/A 03/28/2021   Procedure: ESOPHAGOGASTRODUODENOSCOPY (EGD) WITH PROPOFOL;  Surgeon: Ronald Lobo, MD;  [facility];  Service: Endoscopy;  Laterality: N/A;   KNEE SURGERY Right 1980   KNEE SURGERY Left 1982   OTHER SURGICAL HISTORY  1992   stomach muscles removed due to Coumadin caused bleeding.   TONSILLECTOMY  1965   Social History:   reports that he has never smoked. His smokeless tobacco use includes snuff. He reports that he does not currently use alcohol. He reports that he does not use drugs.  Family History  Problem Relation Age of Onset   Heart disease Neg Hx     Medications: Patient's Medications  New Prescriptions   No medications on file  Previous Medications   ACETAMINOPHEN (TYLENOL) 500 MG TABLET    Take 500 mg by mouth daily as needed for moderate pain.   ALBUTEROL (VENTOLIN HFA) 108 (90 BASE) MCG/ACT INHALER    Inhale 1 puff into the lungs every 6 (six) hours as needed for wheezing or shortness of breath.   CARBAMIDE PEROXIDE (DEBROX) 6.5 % OTIC SOLUTION    Place 5 drops into the left ear 2 (two) times daily.   CHOLECALCIFEROL (VITAMIN D-3 PO)    Take 1 tablet by mouth 2 (two) times daily. Dose unknown   FOLIC ACID (FOLVITE) 1 MG TABLET    Take 1 tablet (1 mg total) by mouth daily.   FUROSEMIDE (LASIX) 20 MG TABLET    Take 1 tablet (20 mg total) by mouth daily as needed for fluid or edema (3lb weight gain).   IRON, FERROUS SULFATE, 325 (65 FE) MG TABS    Take 325 mg by mouth in the morning and at bedtime.   LORATADINE (CLARITIN) 10 MG TABLET    Take 1 tablet (10 mg total) by mouth daily.   LORAZEPAM (ATIVAN) 0.5 MG TABLET    Take one tablet by mouth twice daily as  needed for anxiety.   LOVASTATIN (MEVACOR) 40 MG TABLET    Take 1 tablet (40 mg total) by mouth at bedtime.   PANTOPRAZOLE (PROTONIX) 40 MG TABLET    Take 1 tablet (40 mg total) by mouth 2 (two) times daily.   PROPYLENE GLYCOL (SYSTANE BALANCE) 0.6 % SOLN    Apply 1 drop to eye as needed (for dry eye).   RAMIPRIL (ALTACE) 10 MG CAPSULE    Take 2 capsules (20 mg total) by mouth daily.   TRAZODONE (DESYREL) 50 MG TABLET    Take 0.5 tablets (25 mg total) by mouth at bedtime.   VITAMIN B-12 (CYANOCOBALAMIN)  1000 MCG TABLET    Take 1,000 mcg by mouth daily.   WARFARIN (COUMADIN) 1 MG TABLET    Take 0.5 tablets (0.5 mg total) by mouth daily. Along with a 3 mg tablet totaling 3.5 mg daily   WARFARIN (COUMADIN) 3 MG TABLET    Take 1 tablet (3 mg total) by mouth daily. Add to 0.5 mg to equal 3.5 mg coumadin daily  Modified Medications   No medications on file  Discontinued Medications   SERTRALINE (ZOLOFT) 25 MG TABLET    Take 1 tablet (25 mg total) by mouth daily.    Physical Exam:  Vitals:   07/27/21 1052  BP: 122/74  Pulse: 91  Temp: 97.9 F (36.6 C)  TempSrc: Temporal  SpO2: 98%  Weight: 179 lb (81.2 kg)  Height: 5\' 9"  (1.753 m)   Body mass index is 26.43 kg/m. Wt Readings from Last 3 Encounters:  07/27/21 179 lb (81.2 kg)  07/14/21 178 lb 12.8 oz (81.1 kg)  07/01/21 180 lb (81.6 kg)    Physical Exam Constitutional:      General: He is not in acute distress.    Appearance: He is well-developed. He is not diaphoretic.  HENT:     Head: Normocephalic and atraumatic.     Right Ear: External ear normal.     Left Ear: External ear normal.     Mouth/Throat:     Pharynx: No oropharyngeal exudate.  Eyes:     Conjunctiva/sclera: Conjunctivae normal.     Pupils: Pupils are equal, round, and reactive to light.  Cardiovascular:     Rate and Rhythm: Normal rate and regular rhythm.     Heart sounds: Murmur heard.  Pulmonary:     Effort: Pulmonary effort is normal.     Breath sounds:  Normal breath sounds.  Abdominal:     General: Bowel sounds are normal.     Palpations: Abdomen is soft.  Musculoskeletal:        General: No tenderness.     Cervical back: Normal range of motion and neck supple.     Right lower leg: No edema.     Left lower leg: No edema.  Skin:    General: Skin is warm and dry.  Neurological:     Mental Status: He is alert and oriented to person, place, and time.    Labs reviewed: Basic Metabolic Panel: Recent Labs    03/28/21 0905 04/18/21 1607 04/19/21 0438 04/20/21 0513 04/21/21 0213 04/27/21 1030  NA 138   < > 136 137 133* 136  K 4.2   < > 4.2 4.1 3.8 4.2  CL 109   < > 110 111 105 107  CO2 26   < > 21* 22 22 22   GLUCOSE 94   < > 95 87 104* 96  BUN 16   < > 28* 18 18 16   CREATININE 1.30*   < > 1.40* 1.40* 1.39* 1.19*  CALCIUM 8.1*   < > 8.5* 8.6* 8.7* 8.8  MG 1.7  --  1.8  --   --   --   PHOS 3.2  --  3.6  --   --   --    < > = values in this interval not displayed.   Liver Function Tests: Recent Labs    03/27/21 1532 04/18/21 1607 04/19/21 0438 04/27/21 1030  AST 17 15 12* 16  ALT 10 11 9 9   ALKPHOS 78 77 67  --   BILITOT 0.5 0.5  0.5 0.3  PROT 6.7 5.5* 4.6* 5.6*  ALBUMIN 3.3* 2.9* 2.5*  --    No results for input(s): LIPASE, AMYLASE in the last 8760 hours. No results for input(s): AMMONIA in the last 8760 hours. CBC: Recent Labs    04/21/21 0213 04/27/21 1030 05/19/21 1159  WBC 7.3 5.8 5.3  NEUTROABS 5.2 3,706 3,535  HGB 9.3* 10.9* 11.7*  HCT 29.5* 35.4* 38.8  MCV 80.4 82.1 85.7  PLT 256 341 285   Lipid Panel: Recent Labs    03/09/21 1222  CHOL 185  HDL 67  LDLCALC 91  TRIG 171*  CHOLHDL 2.8   TSH: No results for input(s): TSH in the last 8760 hours. A1C: No results found for: HGBA1C  Lab Results  Component Value Date   INR 2.9 07/27/2021   INR 2.8 07/01/2021   INR 2.8 06/15/2021      Assessment/Plan 1. Anticoagulant long-term use Due to h/o AVR, INR goal 2.5-3.5 INR at goal at 2.9  -  POC INR  2. Dysphagia, unspecified type -feeling of food getting stuck. Had to have dilation in the past.  - Ambulatory referral to Gastroenterology  3. Need for hepatitis C screening test -will get labs with next blood work at next follow up.  4. Gastroesophageal reflux disease without esophagitis Has stopped medication, GERD and dysphagia has increased. Encouraged to restart at this time.  - pantoprazole (PROTONIX) 40 MG tablet; Take 1 tablet (40 mg total) by mouth 2 (two) times daily.  Dispense: 180 tablet; Refill: 3  5. Primary osteoarthritis involving multiple joints -continues on Voltaren gel.  - Ambulatory referral to Physical Therapy  6. Primary insomnia -continue to work on behavioral therapies and lifestyle modifications to improve -has trazodone to use if needed  7. H/O aortic valve replacement Continues on coumadin, INR at [facility] .  8. Acute blood loss anemia -continues on iron- will follow up CBC at next visit.   9. Constipation Due to iron use, also other medication can contribute.  Can use colace 100 mg by mouth daily   Next appt: 1 month for INR check [place] [facility] [place], [facility] [phone]

## 2021-08-04 ENCOUNTER — Encounter: Payer: Self-pay | Admitting: Family Medicine

## 2021-08-04 ENCOUNTER — Ambulatory Visit (INDEPENDENT_AMBULATORY_CARE_PROVIDER_SITE_OTHER): Payer: Medicare Other | Admitting: Family Medicine

## 2021-08-04 ENCOUNTER — Other Ambulatory Visit: Payer: Self-pay

## 2021-08-04 VITALS — BP 136/74 | HR 82 | Temp 96.9°F

## 2021-08-04 DIAGNOSIS — J029 Acute pharyngitis, unspecified: Secondary | ICD-10-CM | POA: Diagnosis not present

## 2021-08-04 DIAGNOSIS — R0989 Other specified symptoms and signs involving the circulatory and respiratory systems: Secondary | ICD-10-CM

## 2021-08-04 LAB — POCT RAPID STREP A (OFFICE): Rapid Strep A Screen: NEGATIVE

## 2021-08-04 NOTE — Progress Notes (Signed)
Negative.

## 2021-08-04 NOTE — Progress Notes (Signed)
Provider:  [name], MD  Careteam: Patient Care Team: [name], NP as PCP - General (Geriatric Medicine)  PLACE OF SERVICE:  [place]  Advanced Directive information    Allergies  Allergen Reactions   Aspirin     Other reaction(s): Other (See Comments) Can't take due to taking blood thinners   Morphine And Related Other (See Comments)    Other reaction(s): Other (See Comments) Hallucinations     Nitroglycerin     Severe headache   Promethazine Other (See Comments)    Other reaction(s): Other (See Comments) Hallucinations     Chief Complaint  Patient presents with   Acute Visit    Patient presents today for a sore throat, cough, runny nose and congestion. He denies any fever.     HPI: Patient is a 75 y.o. male complains today of sore throat with a little bit of cough and congestion.  He denies fever.  Recently took a trip on train to [place] and symptoms developed after he returned.  He has been fully vaccinated for COVID.  He has never had strep throat.  Review of Systems:  Review of Systems  HENT:  Positive for sore throat.   Respiratory:  Positive for cough.   Cardiovascular: Negative.   Genitourinary: Negative.   Musculoskeletal: Negative.   Neurological: Negative.   Psychiatric/Behavioral: Negative.    All other systems reviewed and are negative.  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   Past Surgical History:  Procedure  Laterality Date   AORTIC VALVE REPLACEMENT     mechanical   BIOPSY  03/28/2021   Procedure: BIOPSY;  Surgeon: Ronald Lobo, MD;  Location: [facility];  Service: Endoscopy;;   BLADDER SURGERY  1983   Bladder Cancer Surgery    ESOPHAGOGASTRODUODENOSCOPY (EGD) WITH PROPOFOL N/A 03/28/2021   Procedure: ESOPHAGOGASTRODUODENOSCOPY (EGD) WITH PROPOFOL;  Surgeon: Ronald Lobo, MD;  Location: [facility];  Service: Endoscopy;  Laterality: N/A;   KNEE SURGERY Right 1980   KNEE SURGERY Left 1982   OTHER SURGICAL HISTORY  1992   stomach muscles removed due to Coumadin caused bleeding.   TONSILLECTOMY  1965   Social History:   reports that he has never smoked. His smokeless tobacco use includes snuff. He reports that he does not currently use alcohol. He reports that he does not use drugs.  Family History  Problem Relation Age of Onset   Heart disease Neg Hx     Medications: Patient's Medications  New Prescriptions   No medications on file  Previous Medications   ACETAMINOPHEN (TYLENOL) 500 MG TABLET    Take 500 mg by mouth daily as needed for moderate pain.   ALBUTEROL (VENTOLIN HFA) 108 (90 BASE) MCG/ACT INHALER    Inhale 1 puff into the lungs every 6 (six) hours as needed for wheezing or shortness of breath.   CARBAMIDE  PEROXIDE (DEBROX) 6.5 % OTIC SOLUTION    Place 5 drops into the left ear 2 (two) times daily.   CHOLECALCIFEROL (VITAMIN D-3 PO)    Take 1 tablet by mouth 2 (two) times daily. Dose unknown   FUROSEMIDE (LASIX) 20 MG TABLET    Take 1 tablet (20 mg total) by mouth daily as needed for fluid or edema (3lb weight gain).   IRON, FERROUS SULFATE, 325 (65 FE) MG TABS    Take 325 mg by mouth in the morning and at bedtime.   LORATADINE (CLARITIN) 10 MG TABLET    Take 1 tablet (10 mg total) by mouth daily.   LORAZEPAM (ATIVAN) 0.5 MG TABLET    Take one tablet by mouth twice daily as needed for anxiety.   LOVASTATIN (MEVACOR) 40 MG TABLET    Take 1 tablet (40 mg total) by mouth  at bedtime.   PANTOPRAZOLE (PROTONIX) 40 MG TABLET    Take 1 tablet (40 mg total) by mouth 2 (two) times daily.   PROPYLENE GLYCOL (SYSTANE BALANCE) 0.6 % SOLN    Apply 1 drop to eye as needed (for dry eye).   RAMIPRIL (ALTACE) 10 MG CAPSULE    Take 2 capsules (20 mg total) by mouth daily.   TRAZODONE (DESYREL) 50 MG TABLET    Take 0.5 tablets (25 mg total) by mouth at bedtime.   VITAMIN B-12 (CYANOCOBALAMIN) 1000 MCG TABLET    Take 1,000 mcg by mouth daily.   WARFARIN (COUMADIN) 1 MG TABLET    Take 0.5 tablets (0.5 mg total) by mouth daily. Along with a 3 mg tablet totaling 3.5 mg daily   WARFARIN (COUMADIN) 3 MG TABLET    Take 1 tablet (3 mg total) by mouth daily. Add to 0.5 mg to equal 3.5 mg coumadin daily  Modified Medications   No medications on file  Discontinued Medications   No medications on file    Physical Exam:  Vitals:   08/04/21 1500  BP: 136/74  Pulse: 82  Temp: (!) 96.9 F (36.1 C)  SpO2: 98%   There is no height or weight on file to calculate BMI. Wt Readings from Last 3 Encounters:  07/27/21 179 lb (81.2 kg)  07/14/21 178 lb 12.8 oz (81.1 kg)  07/01/21 180 lb (81.6 kg)    Physical Exam Vitals and nursing note reviewed.  Constitutional:      Appearance: Normal appearance.  HENT:     Head: Normocephalic.     Right Ear: Tympanic membrane normal.     Left Ear: Tympanic membrane normal.     Nose: Nose normal. No congestion.     Mouth/Throat:     Pharynx: Oropharynx is clear. No posterior oropharyngeal erythema.  Pulmonary:     Effort: Pulmonary effort is normal.     Breath sounds: Normal breath sounds.  Neurological:     General: No focal deficit present.     Mental Status: He is alert and oriented to person, place, and time.  Psychiatric:        Mood and Affect: Mood normal.        Behavior: Behavior normal.    Labs reviewed: Basic Metabolic Panel: Recent Labs    03/28/21 0905 04/18/21 1607 04/19/21 0438 04/20/21 0513 04/21/21 0213  04/27/21 1030  NA 138   < > 136 137 133* 136  K 4.2   < > 4.2 4.1 3.8 4.2  CL 109   < > 110 111 105 107  CO2 26   < >  21* 22 22 22   GLUCOSE 94   < > 95 87 104* 96  BUN 16   < > 28* 18 18 16   CREATININE 1.30*   < > 1.40* 1.40* 1.39* 1.19*  CALCIUM 8.1*   < > 8.5* 8.6* 8.7* 8.8  MG 1.7  --  1.8  --   --   --   PHOS 3.2  --  3.6  --   --   --    < > = values in this interval not displayed.   Liver Function Tests: Recent Labs    03/27/21 1532 04/18/21 1607 04/19/21 0438 04/27/21 1030  AST 17 15 12* 16  ALT 10 11 9 9   ALKPHOS 78 77 67  --   BILITOT 0.5 0.5 0.5 0.3  PROT 6.7 5.5* 4.6* 5.6*  ALBUMIN 3.3* 2.9* 2.5*  --    No results for input(s): LIPASE, AMYLASE in the last 8760 hours. No results for input(s): AMMONIA in the last 8760 hours. CBC: Recent Labs    04/21/21 0213 04/27/21 1030 05/19/21 1159  WBC 7.3 5.8 5.3  NEUTROABS 5.2 3,706 3,535  HGB 9.3* 10.9* 11.7*  HCT 29.5* 35.4* 38.8  MCV 80.4 82.1 85.7  PLT 256 341 285   Lipid Panel: Recent Labs    03/09/21 1222  CHOL 185  HDL 67  LDLCALC 91  TRIG 171*  CHOLHDL 2.8   TSH: No results for input(s): TSH in the last 8760 hours. A1C: No results found for: HGBA1C   Assessment/Plan  1. Sore throat Test is negative.  Likely viral etiology - POCT rapid strep A - SARS-COV-2 RNA,(COVID-19) QUAL NAAT  2. Symptoms of upper respiratory infection (URI) Submitted test for COVID.  If test positive will treat with Paxil he was given other comorbidities.  Otherwise lozenges gargles and Claritin for congestion - POCT rapid strep A - SARS-COV-2 RNA,(COVID-19) QUAL NAAT   [name], MD [place] [phone]

## 2021-08-05 LAB — SARS-COV-2 RNA,(COVID-19) QUALITATIVE NAAT: SARS CoV2 RNA: NOT DETECTED

## 2021-08-13 ENCOUNTER — Other Ambulatory Visit: Payer: Self-pay

## 2021-08-13 ENCOUNTER — Telehealth (INDEPENDENT_AMBULATORY_CARE_PROVIDER_SITE_OTHER): Payer: Medicare Other | Admitting: Nurse Practitioner

## 2021-08-13 DIAGNOSIS — Z7901 Long term (current) use of anticoagulants: Secondary | ICD-10-CM

## 2021-08-13 DIAGNOSIS — J40 Bronchitis, not specified as acute or chronic: Secondary | ICD-10-CM | POA: Diagnosis not present

## 2021-08-13 MED ORDER — AMOXICILLIN-POT CLAVULANATE 875-125 MG PO TABS: 1.0000 | ORAL_TABLET | Freq: Two times a day (BID) | ORAL | 0 refills | Status: DC

## 2021-08-13 NOTE — Progress Notes (Signed)
This service is provided via telemedicine  No vital signs collected/recorded due to the encounter was a telemedicine visit.   Location of patient (ex: home, work):  Home  Patient consents to a telephone visit:  Yes, see encounter dated 08/13/2021  Location of the provider (ex: office, home):  [place]  Name of any referring provider:  N/A  Names of all persons participating in the telemedicine service and their role in the encounter:  Sherrie Mustache, Nurse Practitioner, Carroll Kinds, CMA, and patient.    Time spent on call:  6 minutes with medical assistant

## 2021-08-13 NOTE — Progress Notes (Signed)
Careteam: Patient Care Team: [name], NP as PCP - General (Geriatric Medicine)  Advanced Directive information    Allergies  Allergen Reactions   Aspirin     Other reaction(s): Other (See Comments) Can't take due to taking blood thinners   Morphine And Related Other (See Comments)    Other reaction(s): Other (See Comments) Hallucinations     Nitroglycerin     Severe headache   Promethazine Other (See Comments)    Other reaction(s): Other (See Comments) Hallucinations     Chief Complaint  Patient presents with   Acute Visit    Patient complains of still having chest and nasal congestion. Patient has had congestion for about two weeks. Patient has been using robitussin and claritin, which doesn't seem to be helping. Patient has occasional coughing. He is bringing up sputum. No color, but thick.     HPI: Patient is a 75 y.o. male due to going chest and nasal congestion.  Reports he saw Dr Sabra Heck 10 days ago. Overall feels miserable and not getting better. Low energy.  He is staying hydrated and drinking plenty of water.  Using liquid robitussin.  Reports itchy throat but not sore or painful.  No fever.  Increase pressure in sinuses. Not unbearable. Occasional nasal congestion More in the chest.  Cough, somewhat better. Not constant.    Review of Systems:  Review of Systems  Constitutional:  Positive for malaise/fatigue. Negative for chills, fever and weight loss.  HENT:  Positive for congestion. Negative for sinus pain, sore throat and tinnitus.   Respiratory:  Positive for cough, sputum production and wheezing. Negative for shortness of breath.   Cardiovascular:  Negative for chest pain, palpitations and leg swelling.  Gastrointestinal:  Negative for abdominal pain, constipation and diarrhea.  Genitourinary:  Negative for dysuria, frequency and urgency.  Musculoskeletal:  Positive for myalgias. Negative for back pain, falls and joint pain.  Skin:  Negative.   Neurological:  Negative for dizziness and headaches.   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   Past Surgical History:  Procedure Laterality Date   AORTIC VALVE REPLACEMENT     mechanical   BIOPSY  03/28/2021   Procedure: BIOPSY;  Surgeon: Ronald Lobo, MD;  [facility];  Service: Endoscopy;;   BLADDER SURGERY  1983   Bladder Cancer Surgery    ESOPHAGOGASTRODUODENOSCOPY (EGD) WITH PROPOFOL N/A 03/28/2021   Procedure: ESOPHAGOGASTRODUODENOSCOPY (EGD) WITH PROPOFOL;  Surgeon: Ronald Lobo, MD;  Location: [facility];  Service: Endoscopy;  Laterality: N/A;   KNEE SURGERY Right 1980   KNEE SURGERY Left 1982   OTHER SURGICAL HISTORY  1992   stomach muscles removed due to Coumadin caused bleeding.   TONSILLECTOMY  1965   Social History:   reports that he has never smoked. His smokeless tobacco use includes snuff. He reports that he does not currently use alcohol. He  reports that he does not use drugs.  Family History  Problem Relation Age of Onset   Heart disease Neg Hx     Medications: Patient's Medications  New Prescriptions   No medications on file  Previous Medications   ACETAMINOPHEN (TYLENOL) 500 MG TABLET    Take 500 mg by mouth daily as needed for moderate pain.   ALBUTEROL (VENTOLIN HFA) 108 (90 BASE) MCG/ACT INHALER    Inhale 1 puff into the lungs every 6 (six) hours as needed for wheezing or shortness of breath.   CARBAMIDE PEROXIDE (DEBROX)  6.5 % OTIC SOLUTION    Place 5 drops into the left ear 2 (two) times daily.   CHOLECALCIFEROL (VITAMIN D-3 PO)    Take 1 tablet by mouth 2 (two) times daily. Dose unknown   FUROSEMIDE (LASIX) 20 MG TABLET    Take 1 tablet (20 mg total) by mouth daily as needed for fluid or edema (3lb weight gain).   IRON, FERROUS SULFATE, 325 (65 FE) MG TABS    Take 325 mg by mouth in the morning and at bedtime.   LORATADINE (CLARITIN) 10 MG TABLET    Take 1 tablet (10 mg total) by mouth daily.   LOVASTATIN (MEVACOR) 40 MG TABLET    Take 1 tablet (40 mg total) by mouth at bedtime.   PANTOPRAZOLE (PROTONIX) 40 MG TABLET    Take 1 tablet (40 mg total) by mouth 2 (two) times daily.   PROPYLENE GLYCOL (SYSTANE BALANCE) 0.6 % SOLN    Apply 1 drop to eye as needed (for dry eye).   RAMIPRIL (ALTACE) 10 MG CAPSULE    Take 2 capsules (20 mg total) by mouth daily.   TRAZODONE (DESYREL) 50 MG TABLET    Take 0.5 tablets (25 mg total) by mouth at bedtime.   VITAMIN B-12 (CYANOCOBALAMIN) 1000 MCG TABLET    Take 1,000 mcg by mouth daily.   WARFARIN (COUMADIN) 1 MG TABLET    Take 0.5 tablets (0.5 mg total) by mouth daily. Along with a 3 mg tablet totaling 3.5 mg daily   WARFARIN (COUMADIN) 3 MG TABLET    Take 1 tablet (3 mg total) by mouth daily. Add to 0.5 mg to equal 3.5 mg coumadin daily  Modified Medications   No medications on file  Discontinued Medications   LORAZEPAM (ATIVAN) 0.5 MG TABLET    Take one tablet by mouth twice daily as needed for anxiety.    Physical Exam:  There were no vitals filed for this visit. There is no height or weight on file to calculate BMI. Wt Readings from Last 3 Encounters:  07/27/21 179 lb (81.2 kg)  07/14/21 178 lb 12.8 oz (81.1 kg)  07/01/21 180 lb (81.6 kg)    Physical Exam limited due to virtual visit  No acute distress, breathing was normal. No coughing or wheezing noted during video visit.   Labs reviewed: Basic Metabolic Panel: Recent Labs    03/28/21 0905 04/18/21 1607  04/19/21 0438 04/20/21 0513 04/21/21 0213 04/27/21 1030  NA 138   < > 136 137 133* 136  K 4.2   < > 4.2 4.1 3.8 4.2  CL 109   < > 110 111 105 107  CO2 26   < > 21* 22 22 22   GLUCOSE 94   < > 95 87 104* 96  BUN 16   < > 28* 18 18 16   CREATININE 1.30*   < > 1.40* 1.40* 1.39* 1.19*  CALCIUM 8.1*   < >  8.5* 8.6* 8.7* 8.8  MG 1.7  --  1.8  --   --   --   PHOS 3.2  --  3.6  --   --   --    < > = values in this interval not displayed.   Liver Function Tests: Recent Labs    03/27/21 1532 04/18/21 1607 04/19/21 0438 04/27/21 1030  AST 17 15 12* 16  ALT 10 11 9 9   ALKPHOS 78 77 67  --   BILITOT 0.5 0.5 0.5 0.3  PROT 6.7 5.5* 4.6* 5.6*  ALBUMIN 3.3* 2.9* 2.5*  --    No results for input(s): LIPASE, AMYLASE in the last 8760 hours. No results for input(s): AMMONIA in the last 8760 hours. CBC: Recent Labs    04/21/21 0213 04/27/21 1030 05/19/21 1159  WBC 7.3 5.8 5.3  NEUTROABS 5.2 3,706 3,535  HGB 9.3* 10.9* 11.7*  HCT 29.5* 35.4* 38.8  MCV 80.4 82.1 85.7  PLT 256 341 285   Lipid Panel: Recent Labs    03/09/21 1222  CHOL 185  HDL 67  LDLCALC 91  TRIG 171*  CHOLHDL 2.8   TSH: No results for input(s): TSH in the last 8760 hours. A1C: No results found for: HGBA1C   Assessment/Plan 1. Bronchitis Continue supportive care but will add augmentin twice daily for 7 days.  -recommended to take Vit C 1000 mg twice daily, Vit D 2000 units daily and zinc 50 mg daily for 7 days- with food  -tylenol 650 mg by mouth every 8 hours as needed fever/body aches.  -mucinex DM twice daily as needed for cough and chest congestion -do not sit in bed all day, sit up in chair and walk around as tolerated -nasal wash daily and nasal saline PRN.  -education provided on when to follow up and when to seek immediate medication attention through the ED.  - amoxicillin-clavulanate (AUGMENTIN) 875-125 MG tablet; Take 1 tablet by mouth 2 (two) times daily.  Dispense: 14 tablet; Refill: 0  2.  Anticoagulant long-term use -Augmentin less likely than other antibiotics but potential to effect INR. Will have INR checked on 10/17 - [place]. Harle Battiest  [facility] [phone]    Virtual Visit via mychart video  I connected with patient on 08/13/21 at  4:15 PM EDT by video and verified that I am speaking with the correct person using two identifiers.  Location: Patient: home Provider: [place].    I discussed the limitations, risks, security and privacy concerns of performing an evaluation and management service by telephone and the availability of in person appointments. I also discussed with the patient that there may be a patient responsible charge related to this service. The patient expressed understanding and agreed to proceed.   I discussed the assessment and treatment plan with the patient. The patient was provided an opportunity to ask questions and all were answered. The patient agreed with the plan and demonstrated an understanding of the instructions.   The patient was advised to call back or seek an in-person evaluation if the symptoms worsen or if the condition fails to improve as anticipated.  I provided 18 minutes of non-face-to-face time during this encounter.  Carlos American. Harle Battiest Avs printed and mailed

## 2021-08-17 ENCOUNTER — Other Ambulatory Visit: Payer: Medicare Other

## 2021-08-17 ENCOUNTER — Other Ambulatory Visit: Payer: Self-pay

## 2021-08-18 LAB — PROTIME-INR
INR: 3.5 — ABNORMAL HIGH
Prothrombin Time: 33.1 s — ABNORMAL HIGH (ref 9.0–11.5)

## 2021-08-21 ENCOUNTER — Encounter: Payer: Self-pay | Admitting: Nurse Practitioner

## 2021-08-21 ENCOUNTER — Ambulatory Visit (INDEPENDENT_AMBULATORY_CARE_PROVIDER_SITE_OTHER): Payer: Medicare Other | Admitting: Nurse Practitioner

## 2021-08-21 ENCOUNTER — Other Ambulatory Visit: Payer: Self-pay

## 2021-08-21 VITALS — BP 126/72 | HR 99 | Temp 97.1°F | Ht 67.0 in | Wt 177.0 lb

## 2021-08-21 DIAGNOSIS — Z952 Presence of prosthetic heart valve: Secondary | ICD-10-CM | POA: Diagnosis not present

## 2021-08-21 DIAGNOSIS — F5101 Primary insomnia: Secondary | ICD-10-CM

## 2021-08-21 DIAGNOSIS — Z7901 Long term (current) use of anticoagulants: Secondary | ICD-10-CM | POA: Diagnosis not present

## 2021-08-21 DIAGNOSIS — Z9229 Personal history of other drug therapy: Secondary | ICD-10-CM | POA: Diagnosis not present

## 2021-08-21 DIAGNOSIS — F419 Anxiety disorder, unspecified: Secondary | ICD-10-CM

## 2021-08-21 DIAGNOSIS — J40 Bronchitis, not specified as acute or chronic: Secondary | ICD-10-CM

## 2021-08-21 LAB — POCT INR: INR: 3.1 — AB (ref 2.0–3.0)

## 2021-08-21 MED ORDER — WARFARIN SODIUM 1 MG PO TABS: 3.5000 mg | ORAL_TABLET | Freq: Every day | ORAL | 1 refills | Status: DC

## 2021-08-21 MED ORDER — TRAZODONE HCL 50 MG PO TABS: 50.0000 mg | ORAL_TABLET | Freq: Every day | ORAL | 3 refills | Status: DC

## 2021-08-21 MED ORDER — LORAZEPAM 0.5 MG PO TABS: 0.2500 mg | ORAL_TABLET | Freq: Every day | ORAL | 0 refills | Status: DC

## 2021-08-21 NOTE — Patient Instructions (Signed)
Restart trazodone 50 mg by mouth at bedtime  Okay to take alprazolam 1/2 tablet at bedtime.   Increase coumadin to 3.5 mg by mouth daily (as before)

## 2021-08-21 NOTE — Progress Notes (Signed)
Careteam: Patient Care Team: [name], NP as PCP - General (Geriatric Medicine)  PLACE OF SERVICE:  [place] Directive information Does Patient Have a Medical Advance Directive?: No, Would patient like information on creating a medical advance directive?: No - Patient declined  Allergies  Allergen Reactions   Aspirin     Other reaction(s): Other (See Comments) Can't take due to taking blood thinners   Morphine And Related Other (See Comments)    Other reaction(s): Other (See Comments) Hallucinations     Nitroglycerin     Severe headache   Promethazine Other (See Comments)    Other reaction(s): Other (See Comments) Hallucinations     Chief Complaint  Patient presents with   Anticoagulation    PI/INR check (patient recently on antibiotic). No missed doses, no diet changes, and no bleeding. Discuss restarting lorazepam      HPI: Patient is a 75 y.o. male to follow up INR.  Pt had been on Augmentin for URI, completed yesterday.  INR on 10/17 was 3.5 therefore coumadin was decreased to 2 mg daily.  INR today is 3.1 on coumadin 2 mg daily.    He has tired to come off lorazepam at night but having a lot of issues.  Reports he will wake up and in a panic mode. Got back to half tablet at night and then increased back to 1 whole table.   He stopped his trazodone medication for a week because he woke up in a panic but then was also weaning lorazepam.   Constipation noted with antibiotic   Review of Systems:  Review of Systems  Constitutional:  Negative for chills, fever and weight loss.  HENT:  Negative for tinnitus.   Respiratory:  Negative for cough, sputum production and shortness of breath.   Cardiovascular:  Negative for chest pain, palpitations and leg swelling.  Gastrointestinal:  Negative for abdominal pain, constipation, diarrhea and heartburn.  Genitourinary:  Negative for dysuria, frequency and urgency.  Musculoskeletal:  Positive for  myalgias. Negative for back pain, falls and joint pain.  Skin: Negative.   Neurological:  Negative for dizziness and headaches.  Psychiatric/Behavioral:  Negative for depression and memory loss. The patient is nervous/anxious and has insomnia.    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   TONSILLECTOMY  1965   Social History:   reports that he has never smoked. His smokeless tobacco use includes snuff. He reports that he does not currently use alcohol. He reports that he does not use drugs.  Family History  Problem Relation Age of Onset   Heart disease Neg Hx     Medications: Patient's Medications  New Prescriptions   No medications on file  Previous Medications   ACETAMINOPHEN (TYLENOL) 500 MG TABLET    Take 500 mg by mouth daily as needed for moderate pain.    ALBUTEROL (VENTOLIN HFA) 108 (90 BASE) MCG/ACT INHALER    Inhale 1 puff into the lungs every 6 (six) hours as needed for wheezing or shortness of breath.   CARBAMIDE PEROXIDE (DEBROX) 6.5 % OTIC SOLUTION    Place 5 drops into the left ear 2 (two) times daily.   CHOLECALCIFEROL (VITAMIN D-3 PO)    Take 1 tablet by mouth 2 (two) times daily. Dose unknown   FUROSEMIDE (LASIX) 20 MG TABLET    Take 1 tablet (20 mg total) by mouth daily as needed for fluid or edema (3lb weight gain).   IRON, FERROUS SULFATE, 325 (65 FE) MG TABS    Take 325 mg by mouth in the morning and at bedtime.   LORATADINE (CLARITIN) 10 MG TABLET    Take 1 tablet (10 mg total) by mouth daily.   LOVASTATIN (MEVACOR) 40 MG TABLET    Take 1 tablet (40 mg total) by mouth at bedtime.   PANTOPRAZOLE (PROTONIX) 40 MG TABLET    Take 1 tablet (40 mg total) by mouth 2 (two) times daily.   PROPYLENE GLYCOL (SYSTANE BALANCE) 0.6 % SOLN    Apply 1 drop to eye as needed (for dry eye).   RAMIPRIL (ALTACE) 10 MG CAPSULE    Take 2 capsules (20 mg total) by mouth daily.   TRAZODONE (DESYREL) 50 MG TABLET    Take 0.5 tablets (25 mg total) by mouth at bedtime.   VITAMIN B-12 (CYANOCOBALAMIN) 1000 MCG TABLET    Take 1,000 mcg by mouth daily.   WARFARIN (COUMADIN) 1 MG TABLET    Take 2 mg by mouth daily.  Modified Medications   No medications on file  Discontinued Medications   AMOXICILLIN-CLAVULANATE (AUGMENTIN) 875-125 MG TABLET    Take 1 tablet by mouth 2 (two) times daily.    Physical Exam:  There were no vitals filed for this visit. There is no height or weight on file to calculate BMI. Wt Readings from Last 3 Encounters:  07/27/21 179 lb (81.2 kg)  07/14/21 178 lb 12.8 oz (81.1 kg)  07/01/21 180 lb (81.6 kg)    Physical Exam Constitutional:      General: He is not in acute distress.    Appearance: He is well-developed. He is not diaphoretic.  HENT:     Head: Normocephalic and atraumatic.     Right Ear: External ear normal.      Left Ear: External ear normal.     Mouth/Throat:     Pharynx: No oropharyngeal exudate.  Eyes:     Conjunctiva/sclera: Conjunctivae normal.     Pupils: Pupils are equal, round, and reactive to light.  Cardiovascular:     Rate and Rhythm: Normal rate and regular rhythm.     Heart sounds: Normal heart sounds.  Pulmonary:     Effort: Pulmonary effort is normal.  Breath sounds: Normal breath sounds.  Abdominal:     General: Bowel sounds are normal.     Palpations: Abdomen is soft.  Musculoskeletal:        General: No tenderness.     Cervical back: Normal range of motion and neck supple.     Right lower leg: No edema.     Left lower leg: No edema.  Skin:    General: Skin is warm and dry.  Neurological:     Mental Status: He is alert and oriented to person, place, and time.    Labs reviewed: Basic Metabolic Panel: Recent Labs    03/28/21 0905 04/18/21 1607 04/19/21 0438 04/20/21 0513 04/21/21 0213 04/27/21 1030  NA 138   < > 136 137 133* 136  K 4.2   < > 4.2 4.1 3.8 4.2  CL 109   < > 110 111 105 107  CO2 26   < > 21* 22 22 22   GLUCOSE 94   < > 95 87 104* 96  BUN 16   < > 28* 18 18 16   CREATININE 1.30*   < > 1.40* 1.40* 1.39* 1.19*  CALCIUM 8.1*   < > 8.5* 8.6* 8.7* 8.8  MG 1.7  --  1.8  --   --   --   PHOS 3.2  --  3.6  --   --   --    < > = values in this interval not displayed.   Liver Function Tests: Recent Labs    03/27/21 1532 04/18/21 1607 04/19/21 0438 04/27/21 1030  AST 17 15 12* 16  ALT 10 11 9 9   ALKPHOS 78 77 67  --   BILITOT 0.5 0.5 0.5 0.3  PROT 6.7 5.5* 4.6* 5.6*  ALBUMIN 3.3* 2.9* 2.5*  --    No results for input(s): LIPASE, AMYLASE in the last 8760 hours. No results for input(s): AMMONIA in the last 8760 hours. CBC: Recent Labs    04/21/21 0213 04/27/21 1030 05/19/21 1159  WBC 7.3 5.8 5.3  NEUTROABS 5.2 3,706 3,535  HGB 9.3* 10.9* 11.7*  HCT 29.5* 35.4* 38.8  MCV 80.4 82.1 85.7  PLT 256 341 285   Lipid Panel: Recent Labs     03/09/21 1222  CHOL 185  HDL 67  LDLCALC 91  TRIG 171*  CHOLHDL 2.8   TSH: No results for input(s): TSH in the last 8760 hours. A1C: No results found for: HGBA1C   Assessment/Plan   1. Hx of long term use of blood thinners - POC INR 3.1 at goal 2.5-3.5 -off antibiotics. Will have him restart 3.5 mg by mouth daily which he was previously stable on.  - warfarin (COUMADIN) 1 MG tablet; Take 3.5 tablets (3.5 mg total) by mouth daily.  Dispense: 30 tablet; Refill: 1  2. H/O aortic valve replacement Continue on coumadin, 3.1 today, goal 2.5-3.5 - POC INR - warfarin (COUMADIN) 1 MG tablet; Take 3.5 tablets (3.5 mg total) by mouth daily.  Dispense: 30 tablet; Refill: 1  3. Anticoagulant long-term use -due to hx of Aoartic valve replacement  - warfarin (COUMADIN) 1 MG tablet; Take 3.5 tablets (3.5 mg total) by mouth daily.  Dispense: 30 tablet; Refill: 1  4. Bronchitis Improved on Augmentin.   5. Primary insomnia Worsening insomnia since coming off lorazepam. Since has stopped trazodone but was doing well on this prior to stopping lorazepam. Will have him restart at this time and continue on lorazepam 0.5 mg 1/2 tablet at bedtime.    -  traZODone (DESYREL)50 MG tablet; Take 1 tablet (50 mg total) by mouth at bedtime.  Dispense: 30 tablet; Refill: 3  6. Anxiety -worsening night anxiety since titration of lorazepam. Will have him restart lorazepam 0.5 mg 1/2 tablet at bedtime with trazodone.     Next appt: 4 weeks for INR check.  Carlos American. [place], [facility] [phone]

## 2021-08-24 ENCOUNTER — Other Ambulatory Visit: Payer: Self-pay | Admitting: Nurse Practitioner

## 2021-08-24 DIAGNOSIS — F419 Anxiety disorder, unspecified: Secondary | ICD-10-CM

## 2021-08-24 NOTE — Telephone Encounter (Signed)
Pharmacy requested refill.  08/21/2021 was a "no print" refill Contract on File.  Pended Rx and sent to [facility] for approval.

## 2021-08-26 ENCOUNTER — Other Ambulatory Visit: Payer: Self-pay | Admitting: *Deleted

## 2021-08-26 MED ORDER — ALBUTEROL SULFATE HFA 108 (90 BASE) MCG/ACT IN AERS: 1.0000 | INHALATION_SPRAY | Freq: Four times a day (QID) | RESPIRATORY_TRACT | 1 refills | Status: AC | PRN

## 2021-08-26 NOTE — Telephone Encounter (Signed)
Patient requested refill

## 2021-09-01 ENCOUNTER — Other Ambulatory Visit: Payer: Self-pay

## 2021-09-01 ENCOUNTER — Ambulatory Visit (INDEPENDENT_AMBULATORY_CARE_PROVIDER_SITE_OTHER): Payer: Medicare Other | Admitting: Physician Assistant

## 2021-09-01 DIAGNOSIS — L570 Actinic keratosis: Secondary | ICD-10-CM | POA: Diagnosis not present

## 2021-09-01 DIAGNOSIS — Z1283 Encounter for screening for malignant neoplasm of skin: Secondary | ICD-10-CM | POA: Diagnosis not present

## 2021-09-01 NOTE — Patient Instructions (Signed)

## 2021-09-02 ENCOUNTER — Telehealth: Payer: Self-pay | Admitting: *Deleted

## 2021-09-02 ENCOUNTER — Encounter: Payer: Self-pay | Admitting: Physician Assistant

## 2021-09-02 NOTE — Telephone Encounter (Signed)
Received Prior Authorization from [facility] for patient's Albuterol Inhaler  Placed paperwork in [place] folder to review, Fill out and sign.   To be faxed back to [facility] Fax:[phone] Member ID: [id number]

## 2021-09-02 NOTE — Progress Notes (Signed)
   New Patient   Subjective  Tiler Brandis is a 75 y.o. male who presents for the following: Annual Exam (Patients wants his back and head checked today.  ) and New Patient (Initial Visit) (History of skin cancers and pre cancers. Patient is not sure if he has had melanoma or not. He was seem in [place] [facility].).   The following portions of the chart were reviewed this encounter and updated as appropriate:  Tobacco  Allergies  Meds  Problems  Med Hx  Surg Hx  Fam Hx      Objective  Well appearing patient in no apparent distress; mood and affect are within normal limits.  All skin waist up examined.  back, chest and face. Patient needs to start antibiotics for any biopsy due to artificial heart valve per his cardiologist.          [place]  Screening exam for skin cancer back, chest and face.  Schedule appointment for removal of noted lesions.  AK (actinic keratosis) Scalp  patient to use [place] or 5 fu In the winter also ln2 for scalp at next visit      I, Amahd Morino, PA-C, have reviewed all documentation's for this visit.  The documentation on 09/02/21 for the exam, diagnosis, procedures and orders are all accurate and complete.

## 2021-09-21 ENCOUNTER — Other Ambulatory Visit: Payer: Self-pay | Admitting: Nurse Practitioner

## 2021-09-21 DIAGNOSIS — F419 Anxiety disorder, unspecified: Secondary | ICD-10-CM

## 2021-09-21 NOTE — Telephone Encounter (Signed)
Pharmacy requested refill.  Epic LR: 08/24/2021 Contract Date: 07/27/2021 Pended Rx and sent to [facility] for approval.

## 2021-09-22 ENCOUNTER — Telehealth: Payer: Self-pay | Admitting: *Deleted

## 2021-09-22 DIAGNOSIS — F5101 Primary insomnia: Secondary | ICD-10-CM

## 2021-09-22 MED ORDER — TRAZODONE HCL 50 MG PO TABS: 100.0000 mg | ORAL_TABLET | Freq: Every day | ORAL | 3 refills | Status: DC

## 2021-09-22 NOTE — Telephone Encounter (Signed)
He was given a 2 month supply last month, he is supposed to be taking 1/2 tablet at bedtime not a whole. Please verify this is how he is taking medication as he is not due for refill based on that.

## 2021-09-22 NOTE — Telephone Encounter (Signed)
Patient called and stated that his Anxiety is coming back especially with the Holidays and requesting to go back on his original Lorazepam Dosage of 1mg  in the morning and 2 mg in the evening.   Requesting a Rx to be sent to [facility]. Going out of town for [employer].   Please Advise.

## 2021-09-22 NOTE — Telephone Encounter (Signed)
Since he already has trazodone prescribed we will increase this- if he is not already taking daily would recommend taking 50 mg daily at bedtime- if he is taking this daily to increase to 100 mg by mouth at bedtime- can call in new Rx for 100 mg daily at bedtime if needed

## 2021-09-22 NOTE — Telephone Encounter (Signed)
Patient stated that he would like whatever you would recommend.  Stated that he is leaving in the morning out of town and would like to pick it up from the pharmacy today.

## 2021-09-22 NOTE — Telephone Encounter (Signed)
Patient stated that he has been taking the Trazodone 50mg  at bedtime. Stated that he is still having the anxiety and doesn't think this will help him throughout the day.  Stated that he will try it and will increase to 100mg  once daily at bedtime. Medication list updated.

## 2021-09-22 NOTE — Telephone Encounter (Signed)
We will need to discuss this, that medication is not something that should be used daily. If anxiety is worsening would like to have him start something different that is safer for daily and long term use.

## 2021-09-22 NOTE — Telephone Encounter (Signed)
Patient stated that he is taking 1/2 tablet daily.  Stated that the pharmacy did not give him the full Rx. Stated he will speak with the pharmacy.

## 2021-10-07 ENCOUNTER — Ambulatory Visit (INDEPENDENT_AMBULATORY_CARE_PROVIDER_SITE_OTHER): Payer: Medicare Other | Admitting: Nurse Practitioner

## 2021-10-07 ENCOUNTER — Encounter: Payer: Medicare Other | Admitting: Physician Assistant

## 2021-10-07 ENCOUNTER — Other Ambulatory Visit: Payer: Self-pay

## 2021-10-07 ENCOUNTER — Encounter: Payer: Self-pay | Admitting: Nurse Practitioner

## 2021-10-07 VITALS — BP 110/80 | HR 87 | Temp 97.0°F | Ht 67.0 in | Wt 174.8 lb

## 2021-10-07 DIAGNOSIS — Z7901 Long term (current) use of anticoagulants: Secondary | ICD-10-CM | POA: Diagnosis not present

## 2021-10-07 DIAGNOSIS — Z952 Presence of prosthetic heart valve: Secondary | ICD-10-CM

## 2021-10-07 DIAGNOSIS — I10 Essential (primary) hypertension: Secondary | ICD-10-CM

## 2021-10-07 DIAGNOSIS — F419 Anxiety disorder, unspecified: Secondary | ICD-10-CM | POA: Diagnosis not present

## 2021-10-07 DIAGNOSIS — F5101 Primary insomnia: Secondary | ICD-10-CM

## 2021-10-07 LAB — POCT INR: INR: 1.1 — AB (ref 2.0–3.0)

## 2021-10-07 MED ORDER — TRAZODONE HCL 50 MG PO TABS: 75.0000 mg | ORAL_TABLET | Freq: Every day | ORAL | 1 refills | Status: DC

## 2021-10-07 MED ORDER — LORAZEPAM 0.5 MG PO TABS: 0.2500 mg | ORAL_TABLET | Freq: Every day | ORAL | 0 refills | Status: DC | PRN

## 2021-10-07 NOTE — Patient Instructions (Signed)
Okay to continue trazodone 75 mg by mouth daily as bedtime for sleep.   To use lorazepam as needed for panic attacks only

## 2021-10-07 NOTE — Progress Notes (Signed)
Careteam: Patient Care Team: [name], NP as PCP - General (Geriatric Medicine)  PLACE OF SERVICE:  [place]  Advanced Directive information    Allergies  Allergen Reactions   Aspirin     Other reaction(s): Other (See Comments) Can't take due to taking blood thinners   Morphine And Related Other (See Comments)    Other reaction(s): Other (See Comments) Hallucinations     Nitroglycerin     Severe headache   Promethazine Other (See Comments)    Other reaction(s): Other (See Comments) Hallucinations    Promethazine Hcl Other (See Comments)    Chief Complaint  Patient presents with   Acute Visit    INR check.Patient has been having panic attacks, anxiety. Patient has migraine that has not gone away for about a week. Sleep pattern still not consistent. Mixed up on trazadone dose. Concerns about lorazepam and trazadone. Questions about warfarin dose.     HPI: Patient is a 75 y.o. male for follow up INR and sleep/anxiety.   INR today was 1.1 on finger stick- not missed any doses, has been consistent with medication.  Last INR was 3.1 and he was on coumadin 2 mg daily.   Reports he called because he was not sleeping and sleep pattern has been off and having increase in panic attack and had lose of vision in the peripheral.  He took tylenol which was generally effective.  Went to [facility] with very comprehensive exam.  He said that eye MD wanted to get MRI if it persisted which it has. Plans to follow up in regards to this.  He has had a long standing hx of migraine but typically does not last this long.  Also with blurry vision that he has never had before.   He was going off lorazapam and going to 2 tablets of trazodone to equal 100 mg but that was too much so he reduce medication to 75 mg daily at bedtime.  He has been off lorazepam for 10 days having panic attacks.   His foot has healed.   Review of Systems:  Review of Systems  Constitutional:   Negative for chills, fever and weight loss.  HENT:  Negative for tinnitus.   Respiratory:  Negative for cough, sputum production and shortness of breath.   Cardiovascular:  Negative for chest pain, palpitations and leg swelling.  Gastrointestinal:  Negative for abdominal pain, constipation, diarrhea and heartburn.  Genitourinary:  Negative for dysuria, frequency and urgency.  Musculoskeletal:  Negative for back pain, falls, joint pain and myalgias.  Skin: Negative.   Neurological:  Negative for dizziness and headaches.  Psychiatric/Behavioral:  Negative for depression and memory loss. The patient is nervous/anxious and has insomnia.    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   Past Surgical History:  Procedure Laterality Date   AORTIC VALVE REPLACEMENT  mechanical   BIOPSY  03/28/2021   Procedure: BIOPSY;  Surgeon: Ronald Lobo, MD;  [facility];  Service: Endoscopy;;   BLADDER SURGERY  1983   Bladder Cancer Surgery    ESOPHAGOGASTRODUODENOSCOPY (EGD) WITH PROPOFOL N/A 03/28/2021   Procedure: ESOPHAGOGASTRODUODENOSCOPY (EGD) WITH PROPOFOL;  Surgeon: Ronald Lobo, MD;  [facility];  Service: Endoscopy;  Laterality: N/A;   KNEE SURGERY Right 1980   KNEE SURGERY Left 1982   OTHER SURGICAL HISTORY  1992   stomach muscles removed due to Coumadin caused bleeding.   TONSILLECTOMY  1965   Social History:   reports that he has never smoked. His smokeless tobacco  use includes snuff. He reports that he does not currently use alcohol. He reports that he does not use drugs.  Family History  Problem Relation Age of Onset   Heart disease Neg Hx     Medications: Patient's Medications  New Prescriptions   No medications on file  Previous Medications   ACETAMINOPHEN (TYLENOL) 500 MG TABLET    Take 500 mg by mouth daily as needed for moderate pain.   ALBUTEROL (VENTOLIN HFA) 108 (90 BASE) MCG/ACT INHALER    Inhale 1 puff into the lungs every 6 (six) hours as needed for wheezing or shortness of breath.   CARBAMIDE PEROXIDE (DEBROX) 6.5 % OTIC SOLUTION    Place 5 drops into the left ear 2 (two) times daily.   CHOLECALCIFEROL (VITAMIN D-3 PO)    Take 1 tablet by mouth 2 (two) times daily. Dose unknown   DOCUSATE SODIUM (COLACE) 100 MG CAPSULE    Take 100 mg by mouth as needed for mild constipation.   FUROSEMIDE (LASIX) 20 MG TABLET    Take 1 tablet (20 mg total) by mouth daily as needed for fluid or edema (3lb weight gain).   IRON, FERROUS SULFATE, 325 (65 FE) MG TABS    Take 325 mg by mouth in the morning and at bedtime.   LORATADINE (CLARITIN) 10 MG TABLET    Take 1 tablet (10 mg total) by mouth daily.   LORAZEPAM (ATIVAN) 0.5 MG TABLET    Take 0.5 tablets (0.25 mg total) by mouth at bedtime as needed for anxiety.   LOVASTATIN (MEVACOR) 40 MG TABLET    Take 1 tablet (40 mg total) by mouth at bedtime.   PANTOPRAZOLE (PROTONIX) 40 MG TABLET    Take 1 tablet (40 mg total) by mouth 2 (two) times daily.   PROPYLENE GLYCOL (SYSTANE BALANCE) 0.6 % SOLN    Apply 1 drop to eye as needed (for dry eye).   RAMIPRIL (ALTACE) 10 MG CAPSULE    Take 2 capsules (20 mg total) by mouth daily.   TRAZODONE (DESYREL) 50 MG TABLET    Take 2 tablets (100 mg total) by mouth at bedtime.   VITAMIN B-12 (CYANOCOBALAMIN) 1000 MCG TABLET    Take 1,000 mcg by mouth daily.   WARFARIN (COUMADIN) 1 MG TABLET    Take 3.5 tablets (3.5 mg total) by mouth daily.  Modified Medications   No  medications on file  Discontinued Medications   No medications on file    Physical Exam:  Vitals:   10/07/21 1144  BP: 110/80  Pulse: 87  Temp: (!) 97 F (36.1 C)  TempSrc: Temporal  SpO2: 96%  Weight: 174 lb 12.8 oz (79.3 kg)  Height: _0  (1.702 m)   Body mass index is 27.38 kg/m. Wt Readings from Last 3 Encounters:  10/07/21 174 lb 12.8  oz (79.3 kg)  08/21/21 177 lb (80.3 kg)  07/27/21 179 lb (81.2 kg)    Physical Exam Constitutional:      General: He is not in acute distress.    Appearance: He is well-developed. He is not diaphoretic.  HENT:     Head: Normocephalic and atraumatic.     Right Ear: External ear normal.     Left Ear: External ear normal.     Mouth/Throat:     Pharynx: No oropharyngeal exudate.  Eyes:     Conjunctiva/sclera: Conjunctivae normal.     Pupils: Pupils are equal, round, and reactive to light.  Cardiovascular:     Rate and Rhythm: Normal rate and regular rhythm.     Heart sounds: Murmur heard.  Pulmonary:     Effort: Pulmonary effort is normal.     Breath sounds: Normal breath sounds.  Abdominal:     General: Bowel sounds are normal.     Palpations: Abdomen is soft.  Musculoskeletal:        General: No tenderness.     Cervical back: Normal range of motion and neck supple.     Right lower leg: No edema.     Left lower leg: No edema.  Skin:    General: Skin is warm and dry.  Neurological:     Mental Status: He is alert and oriented to person, place, and time.    Labs reviewed: Basic Metabolic Panel: Recent Labs    03/28/21 0905 04/18/21 1607 04/19/21 0438 04/20/21 0513 04/21/21 0213 04/27/21 1030  NA 138   < > 136 137 133* 136  K 4.2   < > 4.2 4.1 3.8 4.2  CL 109   < > 110 111 105 107  CO2 26   < > 21* _0 GLUCOSE 94   < > 95 87 104* 96  BUN 16   < > 28* _1 CREATININE 1.30*   < > 1.40* 1.40* 1.39* 1.19*  CALCIUM 8.1*   < > 8.5* 8.6* 8.7* 8.8  MG 1.7  --  1.8  --   --   --   PHOS 3.2  --  3.6  --   --    --    < > = values in this interval not displayed.   Liver Function Tests: Recent Labs    03/27/21 1532 04/18/21 1607 04/19/21 0438 04/27/21 1030  AST 17 15 12* 16  ALT _2 ALKPHOS 78 77 67  --   BILITOT 0.5 0.5 0.5 0.3  PROT 6.7 5.5* 4.6* 5.6*  ALBUMIN 3.3* 2.9* 2.5*  --    No results for input(s): LIPASE, AMYLASE in the last 8760 hours. No results for input(s): AMMONIA in the last 8760 hours. CBC: Recent Labs    04/21/21 0213 04/27/21 1030 05/19/21 1159  WBC 7.3 5.8 5.3  NEUTROABS 5.2 3,706 3,535  HGB 9.3* 10.9* 11.7*  HCT 29.5* 35.4* 38.8  MCV 80.4 82.1 85.7  PLT 256 341 285   Lipid Panel: Recent Labs    03/09/21 1222  CHOL 185  HDL 67  LDLCALC 91  TRIG 171*  CHOLHDL 2.8   TSH: No results for input(s): TSH in the last 8760 hours. A1C: No results found for: HGBA1C   Assessment/Plan 1. Anticoagulant long-term use INR of 1 with in office machine, reports it has never been that low in the past and consistent with medication. Will follow up lab to confirm -  CBC with Differential/Platelet - POC INR - Protime-INR  2. H/O aortic valve replacement -on coumadin 2 mg daily - CBC with Differential/Platelet  3. Essential hypertension --stable. Goal bp <140/90. Continue on current regimen with low sodium diet.  - CBC with Differential/Platelet - CMP with eGFR([facility])  4. Anxiety -reports recently worse and migraine contributing. He feels panic occasionally and has been out of lorazepam. Will refill for him to use on an as needed basis for panic. Continues on trazodone for sleep/depression/anxiety.  - LORazepam (ATIVAN) 0.5 MG tablet; Take 0.5 tablets (0.25 mg total) by mouth daily as needed (for panic).  Dispense: 30 tablet; Refill: 0  5. Primary insomnia -has been taking trazodone 75 mg daily at bedtime with good effect. hd - traZODone (DESYREL) 50 MG tablet; Take 1.5 tablets (75 mg total) by mouth at bedtime.  Dispense: 45 tablet; Refill:  1   Cearra Portnoy [facility]. [facility], [facility] [phone]

## 2021-10-08 LAB — CBC WITH DIFFERENTIAL/PLATELET
Absolute Monocytes: 463 cells/uL (ref 200–950)
Basophils Absolute: 42 cells/uL (ref 0–200)
Basophils Relative: 0.8 %
Eosinophils Absolute: 322 cells/uL (ref 15–500)
Eosinophils Relative: 6.2 %
HCT: 43.8 % (ref 38.5–50.0)
Hemoglobin: 14.6 g/dL (ref 13.2–17.1)
Lymphs Abs: 1040 cells/uL (ref 850–3900)
MCH: 29.8 pg (ref 27.0–33.0)
MCHC: 33.3 g/dL (ref 32.0–36.0)
MCV: 89.4 fL (ref 80.0–100.0)
MPV: 11.1 fL (ref 7.5–12.5)
Monocytes Relative: 8.9 %
Neutro Abs: 3333 cells/uL (ref 1500–7800)
Neutrophils Relative %: 64.1 %
Platelets: 235 10*3/uL (ref 140–400)
RBC: 4.9 10*6/uL (ref 4.20–5.80)
RDW: 12.9 % (ref 11.0–15.0)
Total Lymphocyte: 20 %
WBC: 5.2 10*3/uL (ref 3.8–10.8)

## 2021-10-08 LAB — COMPLETE METABOLIC PANEL WITH GFR
AG Ratio: 1.4 (calc) (ref 1.0–2.5)
ALT: 9 U/L (ref 9–46)
AST: 17 U/L (ref 10–35)
Albumin: 3.5 g/dL — ABNORMAL LOW (ref 3.6–5.1)
Alkaline phosphatase (APISO): 80 U/L (ref 35–144)
BUN/Creatinine Ratio: 14 (calc) (ref 6–22)
BUN: 18 mg/dL (ref 7–25)
CO2: 21 mmol/L (ref 20–32)
Calcium: 8.9 mg/dL (ref 8.6–10.3)
Chloride: 106 mmol/L (ref 98–110)
Creat: 1.33 mg/dL — ABNORMAL HIGH (ref 0.70–1.28)
Globulin: 2.5 g/dL (calc) (ref 1.9–3.7)
Glucose, Bld: 80 mg/dL (ref 65–99)
Potassium: 4.4 mmol/L (ref 3.5–5.3)
Sodium: 139 mmol/L (ref 135–146)
Total Bilirubin: 0.4 mg/dL (ref 0.2–1.2)
Total Protein: 6 g/dL — ABNORMAL LOW (ref 6.1–8.1)
eGFR: 56 mL/min/{1.73_m2} — ABNORMAL LOW (ref >=60)

## 2021-10-08 LAB — PROTIME-INR
INR: 1
Prothrombin Time: 10.1 s (ref 9.0–11.5)

## 2021-10-09 ENCOUNTER — Ambulatory Visit: Payer: Medicare Other | Admitting: Nurse Practitioner

## 2021-10-09 ENCOUNTER — Other Ambulatory Visit: Payer: Self-pay

## 2021-10-09 DIAGNOSIS — Z952 Presence of prosthetic heart valve: Secondary | ICD-10-CM

## 2021-10-09 DIAGNOSIS — Z9229 Personal history of other drug therapy: Secondary | ICD-10-CM

## 2021-10-09 DIAGNOSIS — Z7901 Long term (current) use of anticoagulants: Secondary | ICD-10-CM

## 2021-10-15 ENCOUNTER — Ambulatory Visit (INDEPENDENT_AMBULATORY_CARE_PROVIDER_SITE_OTHER): Payer: Medicare Other | Admitting: Adult Health

## 2021-10-15 ENCOUNTER — Other Ambulatory Visit: Payer: Self-pay

## 2021-10-15 ENCOUNTER — Encounter: Payer: Self-pay | Admitting: Adult Health

## 2021-10-15 VITALS — BP 120/70 | HR 81 | Temp 97.1°F | Resp 16 | Ht 67.0 in | Wt 177.2 lb

## 2021-10-15 DIAGNOSIS — R002 Palpitations: Secondary | ICD-10-CM

## 2021-10-15 DIAGNOSIS — I38 Endocarditis, valve unspecified: Secondary | ICD-10-CM | POA: Diagnosis not present

## 2021-10-15 DIAGNOSIS — Z7901 Long term (current) use of anticoagulants: Secondary | ICD-10-CM | POA: Diagnosis not present

## 2021-10-15 LAB — POCT INR: INR: 2.6 (ref 2.0–3.0)

## 2021-10-15 MED ORDER — WARFARIN SODIUM 3 MG PO TABS: 3.0000 mg | ORAL_TABLET | Freq: Every day | ORAL | 0 refills | Status: DC

## 2021-10-15 NOTE — Progress Notes (Signed)
Location:  [facility] Service:   clinic    CODE STATUS:   Allergies  Allergen Reactions   Aspirin     Other reaction(s): Other (See Comments) Can't take due to taking blood thinners   Morphine And Related Other (See Comments)    Other reaction(s): Other (See Comments) Hallucinations     Nitroglycerin     Severe headache   Promethazine Other (See Comments)    Other reaction(s): Other (See Comments) Hallucinations    Promethazine Hcl Other (See Comments)    Chief Complaint  Patient presents with   Follow-up    1 week follow up PT/INR    HPI:  His INR today is 2.6 with goal INR 2.5-3.5 he is presently taking 3 mg daily. 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 He denies any palpitations; no bleeding present. No missed doses of coumadin.  I did hear 1-2 second pause while listening to his heart   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[place]) 03/28/2021   Closed compression fracture of body of L1 vertebra (HCC) 03/09/3266   Complication of anesthesia    PONV   Compression fracture of T12 vertebra (HCC) 03/28/2021   Depression    History of bladder cancer    History of pneumonia 1959   History of prostate cancer    History of scarlet fever 1956   History of stroke 2009   Hx of long term use of blood thinners    Hypertension    Long term current use of anticoagulant 03/22/2011   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    Past Surgical History:  Procedure Laterality Date   AORTIC VALVE REPLACEMENT     mechanical   BIOPSY  03/28/2021   Procedure: BIOPSY;  Surgeon: Ronald Lobo, MD;  Location: [facility];  Service: Endoscopy;;   BLADDER SURGERY  1983   Bladder Cancer Surgery    ESOPHAGOGASTRODUODENOSCOPY (EGD) WITH PROPOFOL N/A 03/28/2021   Procedure: ESOPHAGOGASTRODUODENOSCOPY (EGD) WITH PROPOFOL;  Surgeon: Ronald Lobo, MD;  Location: [facility];  Service: Endoscopy;  Laterality: N/A;   KNEE  SURGERY Right 1980   KNEE SURGERY Left 1982   OTHER SURGICAL HISTORY  1992   stomach muscles removed due to Coumadin caused bleeding.   TONSILLECTOMY  1965    Social History   Socioeconomic History   Marital status: Widowed    Spouse name: Not on file   Number of children: Not on file   Years of education: Not on file   Highest education level: Not on file  Occupational History   Not on file  Tobacco Use   Smoking status: Never   Smokeless tobacco: Current    Types: Snuff   Tobacco comments:    4-5 pouches a day  Vaping Use   Vaping Use: Never used  Substance and Sexual Activity   Alcohol use: Not Currently    Comment: 15-20 drinks per week.   Drug use: Never   Sexual activity: Not on file  Other Topics Concern   Not on file  Social History Narrative   Tobacco use, amount per day now: Dip 4 plugs per day.   Past tobacco use, amount per day:   How many years did you use tobacco: 40   Alcohol use (drinks per week): 15-25 drinks.   Diet: poor   Do you drink/eat things with caffeine: Yes   Marital status: Widowed.  What year were you married? 1973   Do you live in a house, apartment, assisted living, condo, trailer, etc.? House   Is it one or more stories? One   How many persons live in your home? Three   Do you have pets in your home?( please list) No   Highest Level of education completed? PhD Candidate.   Current or past profession: Tourist information centre manager, Principle, and Leisure centre manager.   Do you exercise?  No                                Type and how often?   Do you have a living will? Yes   Do you have a DNR form? Yes                                  If not, do you want to discuss one?   Do you have signed POA/HPOA forms?  Yes                      If so, please bring to you appointment      Do you have any difficulty bathing or dressing yourself?    Do you have any difficulty preparing food or eating?   Do you have any difficulty managing your  medications?   Do you have any difficulty managing your finances?   Do you have any difficulty affording your medications?    Social Determinants of Health   Financial Resource Strain: Not on file  Food Insecurity: Not on file  Transportation Needs: Not on file  Physical Activity: Not on file  Stress: Not on file  Social Connections: Not on file  Intimate Partner Violence: Not on file   Family History  Problem Relation Age of Onset   Heart disease Neg Hx       VITAL SIGNS BP 120/70    Pulse 81    Temp (!) 97.1 F (36.2 C)    Resp 16    Ht 5\' 7"  (1.702 m)    Wt 177 lb 3.2 oz (80.4 kg)    SpO2 94%    BMI 27.75 kg/m   Outpatient Encounter Medications as of 10/15/2021  Medication Sig   acetaminophen (TYLENOL) 500 MG tablet Take 500 mg by mouth daily as needed for moderate pain.   albuterol (VENTOLIN HFA) 108 (90 Base) MCG/ACT inhaler Inhale 1 puff into the lungs every 6 (six) hours as needed for wheezing or shortness of breath.   carbamide peroxide (DEBROX) 6.5 % OTIC solution Place 5 drops into the left ear 2 (two) times daily.   Cholecalciferol (VITAMIN D-3 PO) Take 1 tablet by mouth 2 (two) times daily. Dose unknown   docusate sodium (COLACE) 100 MG capsule Take 100 mg by mouth as needed for mild constipation.   furosemide (LASIX) 20 MG tablet Take 1 tablet (20 mg total) by mouth daily as needed for fluid or edema (3lb weight gain).   Iron, Ferrous Sulfate, 325 (65 Fe) MG TABS Take 325 mg by mouth in the morning and at bedtime.   loratadine (CLARITIN) 10 MG tablet Take 1 tablet (10 mg total) by mouth daily.   LORazepam (ATIVAN) 0.5 MG tablet Take 0.5 tablets (0.25 mg total) by mouth daily as needed (for panic).   lovastatin (MEVACOR) 40 MG tablet Take 1 tablet (40 mg total) by  mouth at bedtime.   pantoprazole (PROTONIX) 40 MG tablet Take 1 tablet (40 mg total) by mouth 2 (two) times daily.   Propylene Glycol (SYSTANE BALANCE) 0.6 % SOLN Apply 1 drop to eye as needed (for dry  eye).   ramipril (ALTACE) 10 MG capsule Take 2 capsules (20 mg total) by mouth daily.   traZODone (DESYREL) 50 MG tablet Take 1.5 tablets (75 mg total) by mouth at bedtime.   vitamin B-12 (CYANOCOBALAMIN) 1000 MCG tablet Take 1,000 mcg by mouth daily.   warfarin (COUMADIN) 1 MG tablet Take 4 tablets (4 mg total) by mouth daily. X 3 days then decrease to 3 mg daily and recheck INR in 1 week   No facility-administered encounter medications on file as of 10/15/2021.     SIGNIFICANT DIAGNOSTIC EXAMS  TODAY  10-15-21: INR 2.6 on 3 mg daily   Review of Systems  Constitutional:  Negative for malaise/fatigue.  Respiratory:  Negative for cough and shortness of breath.   Cardiovascular:  Negative for chest pain, palpitations and leg swelling.  Gastrointestinal:  Negative for abdominal pain, constipation and heartburn.  Musculoskeletal:  Negative for back pain, joint pain and myalgias.  Skin: Negative.   Neurological:  Negative for dizziness.  Psychiatric/Behavioral:  The patient is not nervous/anxious.    Physical Exam Constitutional:      General: He is not in acute distress.    Appearance: He is well-developed. He is not diaphoretic.  Neck:     Thyroid: No thyromegaly.  Cardiovascular:     Rate and Rhythm: Normal rate. Rhythm irregular.     Pulses: Normal pulses.     Heart sounds: Murmur heard.  Pulmonary:     Effort: Pulmonary effort is normal. No respiratory distress.     Breath sounds: Normal breath sounds.  Abdominal:     General: Bowel sounds are normal. There is no distension.     Palpations: Abdomen is soft.     Tenderness: There is no abdominal tenderness.  Musculoskeletal:        General: Normal range of motion.     Cervical back: Neck supple.     Right lower leg: No edema.     Left lower leg: No edema.  Lymphadenopathy:     Cervical: No cervical adenopathy.  Skin:    General: Skin is warm and dry.  Neurological:     Mental Status: He is alert and oriented to  person, place, and time.  Psychiatric:        Mood and Affect: Mood normal.     ASSESSMENT/ PLAN:  TODAY  Heart valve disorder Long term anticoagulation S/p AVR  For INR 2.6 will continue coumadin 3 mg daily and will repeat INR on 11-03-21. EKG demonstrates sinus rhythm. No significant findings; Joelene Millin made aware       Ok Edwards NP [facility]   [phone]

## 2021-10-15 NOTE — Patient Instructions (Signed)
Take warfarin 3 mg daily  Repeat INR on 11-03-21.

## 2021-10-16 ENCOUNTER — Ambulatory Visit: Payer: Medicare Other | Admitting: Adult Health

## 2021-10-16 ENCOUNTER — Other Ambulatory Visit: Payer: Self-pay | Admitting: Ophthalmology

## 2021-10-19 ENCOUNTER — Other Ambulatory Visit: Payer: Self-pay | Admitting: Nurse Practitioner

## 2021-10-21 ENCOUNTER — Other Ambulatory Visit (HOSPITAL_COMMUNITY): Payer: Self-pay | Admitting: Sports Medicine

## 2021-10-21 ENCOUNTER — Other Ambulatory Visit: Payer: Self-pay

## 2021-10-21 ENCOUNTER — Ambulatory Visit (HOSPITAL_COMMUNITY)
Admission: RE | Admit: 2021-10-21 | Discharge: 2021-10-21 | Disposition: A | Discharge status: Home / Self Care | Payer: Medicare Other | Source: Ambulatory Visit | Attending: Cardiology | Admitting: Cardiology

## 2021-10-21 DIAGNOSIS — M79605 Pain in left leg: Secondary | ICD-10-CM | POA: Insufficient documentation

## 2021-11-03 ENCOUNTER — Other Ambulatory Visit: Payer: Self-pay

## 2021-11-03 ENCOUNTER — Other Ambulatory Visit: Payer: Medicare Other

## 2021-11-03 DIAGNOSIS — I38 Endocarditis, valve unspecified: Secondary | ICD-10-CM

## 2021-11-04 LAB — PROTIME-INR
INR: 2.4 — ABNORMAL HIGH
Prothrombin Time: 22.8 s — ABNORMAL HIGH (ref 9.0–11.5)

## 2021-11-06 ENCOUNTER — Other Ambulatory Visit: Payer: Self-pay | Admitting: Ophthalmology

## 2021-11-06 DIAGNOSIS — H5310 Unspecified subjective visual disturbances: Secondary | ICD-10-CM

## 2021-11-13 ENCOUNTER — Telehealth: Payer: Self-pay

## 2021-11-13 NOTE — Telephone Encounter (Signed)
Discussed with patient and he verbalized his understanding and agreed. 

## 2021-11-13 NOTE — Telephone Encounter (Signed)
Already on Lorazepam 0.25 mg tablet as needed.Recommend taking Lorazepam 0.5 mg tablet one by mouth prior to MRI on 11/22/2021

## 2021-11-13 NOTE — Telephone Encounter (Signed)
Patient is scheduled for MRI of the brain that was ordered by Dr. Lucianne Lei  with [facility]. Patient states that he was told that he needs to have PCP send in order for sedation medication for this procedure. Patient is scheduled for MRI 11/22/21. Please advise.  Message routed to Marlowe Sax, NP

## 2021-11-15 ENCOUNTER — Other Ambulatory Visit: Payer: Self-pay | Admitting: Adult Health

## 2021-11-17 ENCOUNTER — Encounter: Payer: Medicare Other | Admitting: Nurse Practitioner

## 2021-11-18 ENCOUNTER — Ambulatory Visit (INDEPENDENT_AMBULATORY_CARE_PROVIDER_SITE_OTHER): Payer: Medicare Other | Admitting: Physician Assistant

## 2021-11-18 ENCOUNTER — Other Ambulatory Visit: Payer: Self-pay

## 2021-11-18 DIAGNOSIS — D229 Melanocytic nevi, unspecified: Secondary | ICD-10-CM

## 2021-11-18 DIAGNOSIS — D0439 Carcinoma in situ of skin of other parts of face: Secondary | ICD-10-CM | POA: Diagnosis not present

## 2021-11-18 DIAGNOSIS — D225 Melanocytic nevi of trunk: Secondary | ICD-10-CM

## 2021-11-18 DIAGNOSIS — C4492 Squamous cell carcinoma of skin, unspecified: Secondary | ICD-10-CM

## 2021-11-18 DIAGNOSIS — D485 Neoplasm of uncertain behavior of skin: Secondary | ICD-10-CM

## 2021-11-18 DIAGNOSIS — C44311 Basal cell carcinoma of skin of nose: Secondary | ICD-10-CM

## 2021-11-18 DIAGNOSIS — L578 Other skin changes due to chronic exposure to nonionizing radiation: Secondary | ICD-10-CM | POA: Diagnosis not present

## 2021-11-18 DIAGNOSIS — C4491 Basal cell carcinoma of skin, unspecified: Secondary | ICD-10-CM

## 2021-11-18 HISTORY — DX: Melanocytic nevi, unspecified: D22.9

## 2021-11-18 HISTORY — DX: Squamous cell carcinoma of skin, unspecified: C44.92

## 2021-11-18 HISTORY — DX: Basal cell carcinoma of skin, unspecified: C44.91

## 2021-11-18 NOTE — Patient Instructions (Signed)

## 2021-11-19 ENCOUNTER — Encounter: Payer: Self-pay | Admitting: Nurse Practitioner

## 2021-11-19 ENCOUNTER — Ambulatory Visit (INDEPENDENT_AMBULATORY_CARE_PROVIDER_SITE_OTHER): Payer: Medicare Other | Admitting: Nurse Practitioner

## 2021-11-19 DIAGNOSIS — Z0000 Encounter for general adult medical examination without abnormal findings: Secondary | ICD-10-CM

## 2021-11-19 DIAGNOSIS — Z1159 Encounter for screening for other viral diseases: Secondary | ICD-10-CM | POA: Diagnosis not present

## 2021-11-19 NOTE — Progress Notes (Signed)
Subjective:   Garrett Terry is a 76 y.o. male who presents for Medicare Annual/Subsequent preventive examination.  Review of Systems     Cardiac Risk Factors include: advanced age (>39men, >33 women);sedentary lifestyle;male gender;hypertension     Objective:    There were no vitals filed for this visit. There is no height or weight on file to calculate BMI.  Advanced Directives 11/19/2021 10/15/2021 08/21/2021 07/27/2021 07/14/2021 04/27/2021 04/19/2021  Does Patient Have a Medical Advance Directive? Yes No No No No No Yes  Type of Paramedic of [place];Living will;Out of facility DNR (pink MOST or yellow form) - - - - Press photographer;Living will;Out of facility DNR (pink MOST or yellow form) Healthcare Power of Attorney  Does patient want to make changes to medical advance directive? No - Patient declined - - - - Yes (MAU/Ambulatory/Procedural Areas - Information given) No - Patient declined  Copy of [place] in Chart? No - copy requested - - - - No - copy requested No - copy requested  Would patient like information on creating a medical advance directive? - No - Patient declined No - Patient declined No - Patient declined No - Patient declined - -    Current Medications (verified) Outpatient Encounter Medications as of 11/19/2021  Medication Sig   acetaminophen (TYLENOL) 500 MG tablet Take 500 mg by mouth daily as needed for moderate pain.   albuterol (VENTOLIN HFA) 108 (90 Base) MCG/ACT inhaler Inhale 1 puff into the lungs every 6 (six) hours as needed for wheezing or shortness of breath.   carbamide peroxide (DEBROX) 6.5 % OTIC solution Place 5 drops into the left ear 2 (two) times daily.   Cholecalciferol (VITAMIN D-3 PO) Take 1 tablet by mouth 2 (two) times daily. Dose unknown   docusate sodium (COLACE) 100 MG capsule Take 100 mg by mouth as needed for mild constipation.   furosemide (LASIX) 20 MG tablet Take 1 tablet  (20 mg total) by mouth daily as needed for fluid or edema (3lb weight gain).   Iron, Ferrous Sulfate, 325 (65 Fe) MG TABS Take 325 mg by mouth in the morning and at bedtime.   loratadine (CLARITIN) 10 MG tablet Take 1 tablet (10 mg total) by mouth daily.   LORazepam (ATIVAN) 0.5 MG tablet Take 0.5 tablets (0.25 mg total) by mouth daily as needed (for panic).   lovastatin (MEVACOR) 40 MG tablet TAKE ONE TABLET BY MOUTH AT BEDTIME   pantoprazole (PROTONIX) 40 MG tablet Take 1 tablet (40 mg total) by mouth 2 (two) times daily.   Propylene Glycol (SYSTANE BALANCE) 0.6 % SOLN Apply 1 drop to eye as needed (for dry eye).   ramipril (ALTACE) 10 MG capsule Take 2 capsules (20 mg total) by mouth daily.   traZODone (DESYREL) 50 MG tablet Take 1.5 tablets (75 mg total) by mouth at bedtime.   vitamin B-12 (CYANOCOBALAMIN) 1000 MCG tablet Take 1,000 mcg by mouth daily.   warfarin (COUMADIN) 3 MG tablet TAKE ONE TABLET BY MOUTH DAILY AT 4PM   No facility-administered encounter medications on file as of 11/19/2021.    Allergies (verified) Aspirin, Morphine and related, Nitroglycerin, Promethazine, and Promethazine hcl   History: 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[place]) 03/28/2021   Closed compression fracture of body of L1 vertebra (HCC) 2/54/2706   Complication of anesthesia    PONV   Compression fracture of T12 vertebra ([place]) 03/28/2021  Depression    History of bladder cancer    History of pneumonia 1959   History of prostate cancer    History of scarlet fever 1956   History of stroke 2009   Hx of long term use of blood thinners    Hypertension    Long term current use of anticoagulant 03/22/2011   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   Past Surgical History:  Procedure Laterality Date   AORTIC VALVE REPLACEMENT     mechanical   BIOPSY  03/28/2021   Procedure: BIOPSY;  Surgeon: Ronald Lobo, MD;  [facility]: [facility];  Service: Endoscopy;;   BLADDER SURGERY  1983    Bladder Cancer Surgery    ESOPHAGOGASTRODUODENOSCOPY (EGD) WITH PROPOFOL N/A 03/28/2021   Procedure: ESOPHAGOGASTRODUODENOSCOPY (EGD) WITH PROPOFOL;  Surgeon: Ronald Lobo, MD;  [facility];  Service: Endoscopy;  Laterality: N/A;   KNEE SURGERY Right 1980   KNEE SURGERY Left 1982   OTHER SURGICAL HISTORY  1992   stomach muscles removed due to Coumadin caused bleeding.   TONSILLECTOMY  1965   Family History  Problem Relation Age of Onset   Heart disease Neg Hx    Social History   Socioeconomic History   Marital status: Widowed    Spouse name: Not on file   Number of children: Not on file   Years of education: Not on file   Highest education level: Not on file  Occupational History   Not on file  Tobacco Use   Smoking status: Never   Smokeless tobacco: Current    Types: Snuff   Tobacco comments:    4-5 pouches a day  Vaping Use   Vaping Use: Never used  Substance and Sexual Activity   Alcohol use: Not Currently    Comment: 15-20 drinks per week.   Drug use: Never   Sexual activity: Not on file  Other Topics Concern   Not on file  Social History Narrative   Tobacco use, amount per day now: Dip 4 plugs per day.   Past tobacco use, amount per day:   How many years did you use tobacco: 40   Alcohol use (drinks per week): 15-25 drinks.   Diet: poor   Do you drink/eat things with caffeine: Yes   Marital status: Widowed.                                 What year were you married? 1973   Do you live in a house, apartment, assisted living, condo, trailer, etc.? House   Is it one or more stories? One   How many persons live in your home? Three   Do you have pets in your home?( please list) No   Highest Level of education completed? PhD Candidate.   Current or past profession: Tourist information centre manager, Principle, and Leisure centre manager.   Do you exercise?  No                                Type and how often?   Do you have a living will? Yes   Do you have a DNR form? Yes                                   If not, do you want to discuss one?   Do you  have signed POA/HPOA forms?  Yes                      If so, please bring to you appointment      Do you have any difficulty bathing or dressing yourself?    Do you have any difficulty preparing food or eating?   Do you have any difficulty managing your medications?   Do you have any difficulty managing your finances?   Do you have any difficulty affording your medications?    Social Determinants of Health   Financial Resource Strain: Not on file  Food Insecurity: Not on file  Transportation Needs: Not on file  Physical Activity: Not on file  Stress: Not on file  Social Connections: Not on file    Tobacco Counseling Ready to quit: Not Answered Counseling given: Not Answered Tobacco comments: 4-5 pouches a day   Clinical Intake:  Pre-visit preparation completed: Yes  Pain : 0-10 Pain Type: Chronic pain, Acute pain Pain Location: Other (Comment) (chronic shoulder pain and acute pain to left foot) Pain Onset: More than a month ago Pain Frequency: Intermittent     BMI - recorded: 27.75 Nutritional Status: BMI 25 -29 Overweight Diabetes: No  How often do you need to have someone help you when you read instructions, pamphlets, or other written materials from your doctor or pharmacy?: 1 - Never  Diabetic?no         Activities of Daily Living In your present state of health, do you have any difficulty performing the following activities: 11/19/2021 04/19/2021  Hearing? [place]? N -  Difficulty concentrating or making decisions? N -  Walking or climbing stairs? N -  Comment - -  Dressing or bathing? N -  Doing errands, shopping? N N  Preparing Food and eating ? N -  Using the Toilet? N -  In the past six months, have you accidently leaked urine? N -  Do you have problems with loss of bowel control? N -  Managing your Medications? N -  Managing your Finances? N -  Housekeeping or  managing your Housekeeping? N -  Some recent data might be hidden    Patient Care Team: [name], NP as PCP - General (Geriatric Medicine)  Indicate any recent Medical Services you may have received from other than [facility] providers in the past year (date may be approximate).     Assessment:   This is a routine wellness examination for [place].  Hearing/Vision screen Hearing Screening - Comments:: Patient wears hearing aids. Vision Screening - Comments:: Patient has no vision problems. Patient sees Dr. Eliseo Squires at [facility] care.  Dietary issues and exercise activities discussed: Current Exercise Habits: The patient does not participate in regular exercise at present, Exercise limited by: orthopedic condition(s)   Goals Addressed   None    Depression Screen PHQ 2/9 Scores 11/19/2021  Exception Documentation Other- indicate reason in comment box  Not completed Pt. was seeing someone,but didn't work out and looking for someone else.    Fall Risk Fall Risk  11/19/2021 10/15/2021 07/27/2021 07/14/2021 05/06/2021  Falls in the past year? 1 0 1 0 0  Number falls in past yr: 1 0 0 0 0  Injury with Fall? 1 0 1 0 0  Risk for fall due to : History of fall(s) No Fall Risks No Fall Risks No Fall Risks No Fall Risks  Follow up Falls evaluation completed  Falls evaluation completed Falls evaluation completed Falls evaluation completed Falls evaluation completed;Education provided;Falls prevention discussed    FALL RISK PREVENTION PERTAINING TO THE HOME:  Any stairs in or around the home? No  If so, are there any without handrails? No  Home free of loose throw rugs in walkways, pet beds, electrical cords, etc? Yes  Adequate lighting in your home to reduce risk of falls? Yes   ASSISTIVE DEVICES UTILIZED TO PREVENT FALLS:  Life alert? No  Use of a cane, walker or w/c? No  Grab bars in the bathroom? Yes  Shower chair or bench in shower? Yes  Elevated toilet seat or a handicapped toilet?  No   TIMED UP AND GO:  Was the test performed? No .   Cognitive Function:     6CIT Screen 11/19/2021  What Year? 0 points  What month? 0 points  What time? 0 points  Count back from 20 0 points  Months in reverse 0 points  Repeat phrase 4 points  Total Score 4    Immunizations Immunization History  Administered Date(s) Administered   Fluad Quad(high Dose 65+) 07/01/2021   Influenza Inj Mdck Quad Pf 07/08/2016   Influenza Split 08/06/2011, 08/14/2012, 07/20/2013, 07/19/2014   Influenza, High Dose Seasonal PF 07/26/2017, 07/23/2019   Influenza, Seasonal, Injecte, Preservative Fre 07/28/2015   Influenza,inj,Quad PF,6+ Mos 08/21/2018, 08/11/2020   PFIZER(Purple Top)SARS-COV-2 Vaccination 11/01/2019, 11/20/2019, 09/24/2020   Pneumococcal Conjugate-13 07/19/2014   Pneumococcal Polysaccharide-23 07/28/2015   Tdap 03/20/2020    TDAP status: Up to date  Flu Vaccine status: Up to date  Pneumococcal vaccine status: Up to date  Covid-19 vaccine status: Information provided on how to obtain vaccines.   Qualifies for Shingles Vaccine? Yes   Zostavax completed No   Shingrix Completed?: No.    Education has been provided regarding the importance of this vaccine. Patient has been advised to call insurance company to determine out of pocket expense if they have not yet received this vaccine. Advised may also receive vaccine at local pharmacy or Health Dept. Verbalized acceptance and understanding.  Screening Tests Health Maintenance  Topic Date Due   Zoster Vaccines- Shingrix (1 of 2) Never done   Fecal DNA (Cologuard)  Never done   COVID-19 Vaccine (4 - Booster for Pfizer series) 11/19/2020   TETANUS/TDAP  03/20/2030   Pneumonia Vaccine 56+ Years old  Completed   INFLUENZA VACCINE  Completed   Hepatitis C Screening  Completed   HPV VACCINES  Aged Out    Health Maintenance  Health Maintenance Due  Topic Date Due   Zoster Vaccines- Shingrix (1 of 2) Never done   Fecal DNA  (Cologuard)  Never done   COVID-19 Vaccine (4 - Booster for Pfizer series) 11/19/2020    Colorectal cancer screening: Type of screening: Cologuard. Completed 06/02/2021. Repeat every 3 years  Lung Cancer Screening: (Low Dose CT Chest recommended if Age 100-80 years, 30 pack-year currently smoking OR have quit w/in 15years.) does not qualify.   Lung Cancer Screening Referral: na  Additional Screening:  Hepatitis C Screening: does qualify; Complete with next appt  Vision Screening: Recommended annual ophthalmology exams for early detection of glaucoma and other disorders of the eye. Is the patient up to date with their annual eye exam?  Yes  Who is the provider or what is the name of the office in which the patient attends annual eye exams? Dr Eliseo Squires If pt is not established with a provider, would they like to be referred  to a provider to establish care? No .   Dental Screening: Recommended annual dental exams for proper oral hygiene  Community Resource Referral / Chronic Care Management: CRR required this visit?  No   CCM required this visit?  No      Plan:     I have personally reviewed and noted the following in the patients chart:   Medical and social history Use of alcohol, tobacco or illicit drugs  Current medications and supplements including opioid prescriptions. Patient is not currently taking opioid prescriptions. Functional ability and status Nutritional status Physical activity Advanced directives List of other physicians Hospitalizations, surgeries, and ER visits in previous 12 months Vitals Screenings to include cognitive, depression, and falls Referrals and appointments  In addition, I have reviewed and discussed with patient certain preventive protocols, quality metrics, and best practice recommendations. A written personalized care plan for preventive services as well as general preventive health recommendations were provided to patient.     [name], NP   11/19/2021    Virtual Visit via Telephone Note  I connected withNAME@ on 11/19/21 at  4:15 PM EST by telephone and verified that I am speaking with the correct person using two identifiers.  Location: Patient: home Provider: [facility]   I discussed the limitations, risks, security and privacy concerns of performing an evaluation and management service by telephone and the availability of in person appointments. I also discussed with the patient that there may be a patient responsible charge related to this service. The patient expressed understanding and agreed to proceed.   I discussed the assessment and treatment plan with the patient. The patient was provided an opportunity to ask questions and all were answered. The patient agreed with the plan and demonstrated an understanding of the instructions.   The patient was advised to call back or seek an in-person evaluation if the symptoms worsen or if the condition fails to improve as anticipated.  I provided 18 minutes of non-face-to-face time during this encounter.  Carlos American. Harle Battiest Avs printed and mailed

## 2021-11-19 NOTE — Patient Instructions (Signed)
[name] , Thank you for taking time to come for your Medicare Wellness Visit. I appreciate your ongoing commitment to your health goals. Please review the following plan we discussed and let me know if I can assist you in the future.   Screening recommendations/referrals: Colonoscopy to schedule follow up for abnormal cologuard  Recommended yearly ophthalmology/optometry visit for glaucoma screening and checkup Recommended yearly dental visit for hygiene and checkup  Vaccinations: Influenza vaccine up to date Pneumococcal vaccine up to date Tdap vaccine up to date Shingles vaccine RECOMMENDED to get at local pharmacy     Advanced directives: to bring to office so we can place on file.   Conditions/risks identified: advance age, fall risk, risk of stroke  Next appointment: yearly for awv  Preventive Care 74 Years and Older, Male Preventive care refers to lifestyle choices and visits with your health care provider that can promote health and wellness. What does preventive care include? A yearly physical exam. This is also called an annual well check. Dental exams once or twice a year. Routine eye exams. Ask your health care provider how often you should have your eyes checked. Personal lifestyle choices, including: Daily care of your teeth and gums. Regular physical activity. Eating a healthy diet. Avoiding tobacco and drug use. Limiting alcohol use. Practicing safe sex. Taking low doses of aspirin every day. Taking vitamin and mineral supplements as recommended by your health care provider. What happens during an annual well check? The services and screenings done by your health care provider during your annual well check will depend on your age, overall health, lifestyle risk factors, and family history of disease. Counseling  Your health care provider may ask you questions about your: Alcohol use. Tobacco use. Drug use. Emotional well-being. Home and relationship  well-being. Sexual activity. Eating habits. History of falls. Memory and ability to understand (cognition). Work and work Statistician. Screening  You may have the following tests or measurements: Height, weight, and BMI. Blood pressure. Lipid and cholesterol levels. These may be checked every 5 years, or more frequently if you are over 29 years old. Skin check. Lung cancer screening. You may have this screening every year starting at age 1 if you have a 30-pack-year history of smoking and currently smoke or have quit within the past 15 years. Fecal occult blood test (FOBT) of the stool. You may have this test every year starting at age 91. Flexible sigmoidoscopy or colonoscopy. You may have a sigmoidoscopy every 5 years or a colonoscopy every 10 years starting at age 19. Prostate cancer screening. Recommendations will vary depending on your family history and other risks. Hepatitis C blood test. Hepatitis B blood test. Sexually transmitted disease (STD) testing. Diabetes screening. This is done by checking your blood sugar (glucose) after you have not eaten for a while (fasting). You may have this done every 1-3 years. Abdominal aortic aneurysm (AAA) screening. You may need this if you are a current or former smoker. Osteoporosis. You may be screened starting at age 75 if you are at high risk. Talk with your health care provider about your test results, treatment options, and if necessary, the need for more tests. Vaccines  Your health care provider may recommend certain vaccines, such as: Influenza vaccine. This is recommended every year. Tetanus, diphtheria, and acellular pertussis (Tdap, Td) vaccine. You may need a Td booster every 10 years. Zoster vaccine. You may need this after age 1. Pneumococcal 13-valent conjugate (PCV13) vaccine. One dose is recommended after  age 18. Pneumococcal polysaccharide (PPSV23) vaccine. One dose is recommended after age 41. Talk to your health care  provider about which screenings and vaccines you need and how often you need them. This information is not intended to replace advice given to you by your health care provider. Make sure you discuss any questions you have with your health care provider. Document Released: 11/14/2015 Document Revised: 07/07/2016 Document Reviewed: 08/19/2015 Elsevier Interactive Patient Education  2017 [place] Prevention in the Home Falls can cause injuries. They can happen to people of all ages. There are many things you can do to make your home safe and to help prevent falls. What can I do on the outside of my home? Regularly fix the edges of walkways and driveways and fix any cracks. Remove anything that might make you trip as you walk through a door, such as a raised step or threshold. Trim any bushes or trees on the path to your home. Use bright outdoor lighting. Clear any walking paths of anything that might make someone trip, such as rocks or tools. Regularly check to see if handrails are loose or broken. Make sure that both sides of any steps have handrails. Any raised decks and porches should have guardrails on the edges. Have any leaves, snow, or ice cleared regularly. Use sand or salt on walking paths during winter. Clean up any spills in your garage right away. This includes oil or grease spills. What can I do in the bathroom? Use night lights. Install grab bars by the toilet and in the tub and shower. Do not use towel bars as grab bars. Use non-skid mats or decals in the tub or shower. If you need to sit down in the shower, use a plastic, non-slip stool. Keep the floor dry. Clean up any water that spills on the floor as soon as it happens. Remove soap buildup in the tub or shower regularly. Attach bath mats securely with double-sided non-slip rug tape. Do not have throw rugs and other things on the floor that can make you trip. What can I do in the bedroom? Use night lights. Make  sure that you have a light by your bed that is easy to reach. Do not use any sheets or blankets that are too big for your bed. They should not hang down onto the floor. Have a firm chair that has side arms. You can use this for support while you get dressed. Do not have throw rugs and other things on the floor that can make you trip. What can I do in the kitchen? Clean up any spills right away. Avoid walking on wet floors. Keep items that you use a lot in easy-to-reach places. If you need to reach something above you, use a strong step stool that has a grab bar. Keep electrical cords out of the way. Do not use floor polish or wax that makes floors slippery. If you must use wax, use non-skid floor wax. Do not have throw rugs and other things on the floor that can make you trip. What can I do with my stairs? Do not leave any items on the stairs. Make sure that there are handrails on both sides of the stairs and use them. Fix handrails that are broken or loose. Make sure that handrails are as long as the stairways. Check any carpeting to make sure that it is firmly attached to the stairs. Fix any carpet that is loose or worn. Avoid having throw rugs  at the top or bottom of the stairs. If you do have throw rugs, attach them to the floor with carpet tape. Make sure that you have a light switch at the top of the stairs and the bottom of the stairs. If you do not have them, ask someone to add them for you. What else can I do to help prevent falls? Wear shoes that: Do not have high heels. Have rubber bottoms. Are comfortable and fit you well. Are closed at the toe. Do not wear sandals. If you use a stepladder: Make sure that it is fully opened. Do not climb a closed stepladder. Make sure that both sides of the stepladder are locked into place. Ask someone to hold it for you, if possible. Clearly mark and make sure that you can see: Any grab bars or handrails. First and last steps. Where the  edge of each step is. Use tools that help you move around (mobility aids) if they are needed. These include: Canes. Walkers. Scooters. Crutches. Turn on the lights when you go into a dark area. Replace any light bulbs as soon as they burn out. Set up your furniture so you have a clear path. Avoid moving your furniture around. If any of your floors are uneven, fix them. If there are any pets around you, be aware of where they are. Review your medicines with your doctor. Some medicines can make you feel dizzy. This can increase your chance of falling. Ask your doctor what other things that you can do to help prevent falls. This information is not intended to replace advice given to you by your health care provider. Make sure you discuss any questions you have with your health care provider. Document Released: 08/14/2009 Document Revised: 03/25/2016 Document Reviewed: 11/22/2014 Elsevier Interactive Patient Education  2017 [employer].

## 2021-11-19 NOTE — Progress Notes (Signed)
This service is provided via telemedicine  No vital signs collected/recorded due to the encounter was a telemedicine visit.   Location of patient (ex: home, work):  Home  Patient consents to a telephone visit:  Yes, see encounter dated 11/19/2021  Location of the provider (ex: office, home):  [place]  Name of any referring provider:  N/A  Names of all persons participating in the telemedicine service and their role in the encounter:  Sherrie Mustache, Nurse Practitioner, Carroll Kinds, CMA, and patient.   Time spent on call:  11 minutes with medical assistant

## 2021-11-22 ENCOUNTER — Other Ambulatory Visit: Payer: Self-pay

## 2021-11-22 ENCOUNTER — Ambulatory Visit
Admission: RE | Admit: 2021-11-22 | Discharge: 2021-11-22 | Disposition: A | Discharge status: Home / Self Care | Payer: Medicare Other | Source: Ambulatory Visit | Attending: Ophthalmology | Admitting: Ophthalmology

## 2021-11-22 DIAGNOSIS — H5310 Unspecified subjective visual disturbances: Secondary | ICD-10-CM

## 2021-11-23 ENCOUNTER — Encounter: Payer: Self-pay | Admitting: Physician Assistant

## 2021-11-23 NOTE — Progress Notes (Signed)
Follow-Up Visit   Subjective  Garrett Terry is a 76 y.o. male who presents for the following: Skin Problem (Pt here for a BX noted from previous DOS).   The following portions of the chart were reviewed this encounter and updated as appropriate:  Tobacco   Allergies   Meds   Problems   Med Hx   Surg Hx   Fam Hx       Objective  Well appearing patient in no apparent distress; mood and affect are within normal limits.  All skin waist up examined.  Right Alar Crease Pearly papule with telangectasia.        right temple Hyperkeratotic scale with pink base        Neck - Anterior Hyperkeratotic scale with pink base        Right Lower Back Bichromic dark nested macule.         Assessment & Plan  Neoplasm of uncertain behavior of skin (4) Right Alar Crease  Skin / nail biopsy Type of biopsy: tangential   Informed consent: discussed and consent obtained   Timeout: patient name, date of birth, surgical site, and procedure verified   Procedure prep:  Patient was prepped and draped in usual sterile fashion (Non sterile) Prep type:  Chlorhexidine Anesthesia: the lesion was anesthetized in a standard fashion   Anesthetic:  1% lidocaine w/ epinephrine 1-100,000 local infiltration Instrument used: flexible razor blade   Outcome: patient tolerated procedure well   Post-procedure details: wound care instructions given    Specimen 1 - Surgical pathology Differential Diagnosis: bcc vs scc  Check Margins: No  right temple  Skin / nail biopsy Type of biopsy: tangential   Informed consent: discussed and consent obtained   Timeout: patient name, date of birth, surgical site, and procedure verified   Procedure prep:  Patient was prepped and draped in usual sterile fashion (Non sterile) Prep type:  Chlorhexidine Anesthesia: the lesion was anesthetized in a standard fashion   Anesthetic:  1% lidocaine w/ epinephrine 1-100,000 local infiltration Instrument  used: flexible razor blade   Outcome: patient tolerated procedure well   Post-procedure details: wound care instructions given    Specimen 2 - Surgical pathology Differential Diagnosis: bcc vs scc  Check Margins: No  Neck - Anterior  Skin / nail biopsy Type of biopsy: tangential   Informed consent: discussed and consent obtained   Timeout: patient name, date of birth, surgical site, and procedure verified   Procedure prep:  Patient was prepped and draped in usual sterile fashion (Non sterile) Prep type:  Chlorhexidine Anesthesia: the lesion was anesthetized in a standard fashion   Anesthetic:  1% lidocaine w/ epinephrine 1-100,000 local infiltration Instrument used: flexible razor blade   Outcome: patient tolerated procedure well   Post-procedure details: wound care instructions given    Specimen 3 - Surgical pathology Differential Diagnosis: bcc vs scc  Check Margins: No  Right Lower Back  Skin / nail biopsy Type of biopsy: tangential   Informed consent: discussed and consent obtained   Timeout: patient name, date of birth, surgical site, and procedure verified   Procedure prep:  Patient was prepped and draped in usual sterile fashion (Non sterile) Prep type:  Chlorhexidine Anesthesia: the lesion was anesthetized in a standard fashion   Anesthetic:  1% lidocaine w/ epinephrine 1-100,000 local infiltration Instrument used: flexible razor blade   Outcome: patient tolerated procedure well   Post-procedure details: wound care instructions given    Specimen 4 -  Surgical pathology Differential Diagnosis: r/o atypia  Check Margins: yes    I, Rio Taber, PA-C, have reviewed all documentation's for this visit.  The documentation on 11/23/21 for the exam, diagnosis, procedures and orders are all accurate and complete.

## 2021-11-24 ENCOUNTER — Telehealth: Payer: Self-pay | Admitting: *Deleted

## 2021-11-24 NOTE — Telephone Encounter (Signed)
Referral sent to [facility] surgery [facility] for mohs and other surgery made with [facility] [place] for treatment.

## 2021-11-24 NOTE — Telephone Encounter (Signed)
-----   Message from Warren Danes, [place] sent at 11/23/2021  3:07 PM EST ----- 1 mohs, 2 edc, 4 ws

## 2021-11-24 NOTE — Telephone Encounter (Signed)
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 Told patient to call us if he hasn't heard anything from skin surgery center within 2 weeks. Also per [facility] [place] patient needs to take amoxicillin prior to procedure.

## 2021-11-30 ENCOUNTER — Other Ambulatory Visit: Payer: Self-pay

## 2021-11-30 ENCOUNTER — Ambulatory Visit (INDEPENDENT_AMBULATORY_CARE_PROVIDER_SITE_OTHER): Payer: Medicare Other | Admitting: Nurse Practitioner

## 2021-11-30 ENCOUNTER — Encounter: Payer: Self-pay | Admitting: Nurse Practitioner

## 2021-11-30 VITALS — BP 138/72 | HR 76 | Temp 97.3°F | Ht 67.0 in | Wt 176.0 lb

## 2021-11-30 DIAGNOSIS — F1021 Alcohol dependence, in remission: Secondary | ICD-10-CM | POA: Diagnosis not present

## 2021-11-30 DIAGNOSIS — F5101 Primary insomnia: Secondary | ICD-10-CM

## 2021-11-30 DIAGNOSIS — J309 Allergic rhinitis, unspecified: Secondary | ICD-10-CM

## 2021-11-30 DIAGNOSIS — Z9229 Personal history of other drug therapy: Secondary | ICD-10-CM

## 2021-11-30 DIAGNOSIS — D6869 Other thrombophilia: Secondary | ICD-10-CM

## 2021-11-30 DIAGNOSIS — F419 Anxiety disorder, unspecified: Secondary | ICD-10-CM

## 2021-11-30 DIAGNOSIS — Z7901 Long term (current) use of anticoagulants: Secondary | ICD-10-CM

## 2021-11-30 DIAGNOSIS — I700 Atherosclerosis of aorta: Secondary | ICD-10-CM | POA: Diagnosis not present

## 2021-11-30 DIAGNOSIS — Z952 Presence of prosthetic heart valve: Secondary | ICD-10-CM

## 2021-11-30 DIAGNOSIS — I10 Essential (primary) hypertension: Secondary | ICD-10-CM

## 2021-11-30 LAB — POCT INR: INR: 2.7 (ref 2.0–3.0)

## 2021-11-30 MED ORDER — TRAZODONE HCL 100 MG PO TABS: 100.0000 mg | ORAL_TABLET | Freq: Every day | ORAL | 1 refills | Status: DC

## 2021-11-30 MED ORDER — FLUTICASONE PROPIONATE 50 MCG/ACT NA SUSP: 1.0000 | Freq: Every day | NASAL | 6 refills | Status: DC

## 2021-11-30 NOTE — Progress Notes (Signed)
Careteam: Patient Care Team: [name], NP as PCP - General (Geriatric Medicine)  PLACE OF SERVICE:  [facility]  Advanced Directive information    Allergies  Allergen Reactions   Aspirin     Other reaction(s): Other (See Comments) Can't take due to taking blood thinners   Morphine And Related Other (See Comments)    Other reaction(s): Other (See Comments) Hallucinations     Nitroglycerin     Severe headache   Promethazine Other (See Comments)    Other reaction(s): Other (See Comments) Hallucinations    Promethazine Hcl Other (See Comments)    Chief Complaint  Patient presents with   Medical Management of Chronic Issues    6 month follow-up. Patient denies receiving any vaccines since last visit. Discuss need for shingrix, covid boosters and cologuard or post pone if patient refuses. Discuss nasal blockage in left nostril (x 3 months), tried nasal spray. Discuss sleep medication, ineffective at current recommendation. Refill trazodone. Discuss Hip pain (right is worse) and shoulder pain. Patient c/o left calf muscle pain, constant. FYI, MRI of brain recently. Positive on depression screening, scored 8.    Anticoagulation    PT/INR check. no missed doses, no unusual bleeding, no major diet changes.     HPI: Patient is a 76 y.o. male here for INR/PT check and follow up.    Pt with ongoing pain to shoulders, hip, legs and feet. Has appointment with physical therapy tomorrow and has orthopedic appt for left calf muscle tear and broke left metatarsals. Takes tylenol  BID 1500 mg pills. Takes no NSAIDS.   Concerned about congestion, ongoing nasal congestion, has used flonase in the past   Would like a list of therapists. Reports he does not feel like his current therapist is a good fit.   He went up on trazodone to 2 tablets and working well. Would like refill for 100 mg nightly- this was previously prescribed but felt like there was side effects with it. Now  doing well on this dose. Has not use ativan in a while.    MRI of the brain done due to ongoing headaches and visual disturbances, without acute findings.   INR today of 2.7, no missed doses, continues on coumadin 3 mg daily. No LE edema, chest pains, shortness of breath, no abnormal bruising or bleeding. No changes in diet.   Review of Systems:  Review of Systems  Constitutional:  Negative for chills, fever, malaise/fatigue and weight loss.  Respiratory:  Positive for cough. Negative for shortness of breath and wheezing.        Congested  Cardiovascular:  Negative for chest pain, palpitations and leg swelling.  Gastrointestinal:  Positive for heartburn. Negative for abdominal pain, constipation, diarrhea, nausea and vomiting.       GERD is stable on pantoprazole, past few weeks.  Genitourinary: Negative.  Negative for hematuria.  Musculoskeletal:  Positive for joint pain and myalgias.  Neurological:  Positive for headaches.       History of migraines, tylenol helps  Endo/Heme/Allergies:  Does not bruise/bleed easily.  Psychiatric/Behavioral:  Positive for depression.    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[place]) 03/28/2021   Closed compression fracture of body of L1 vertebra ([place]) 7/90/2409   Complication of anesthesia    PONV   Compression fracture of T12 vertebra ([place]) 03/28/2021   Depression    History of bladder cancer    History of pneumonia 1959  History of prostate cancer    History of scarlet fever 1956   History of stroke 2009   Hx of long term use of blood thinners    Hypertension    Long term current use of anticoagulant 03/22/2011   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   Past Surgical History:  Procedure Laterality Date   AORTIC VALVE REPLACEMENT     mechanical   BIOPSY  03/28/2021   Procedure: BIOPSY;  Surgeon: Ronald Lobo, MD;  Location: [facility];  Service: Endoscopy;;   BLADDER SURGERY  1983   Bladder Cancer Surgery     ESOPHAGOGASTRODUODENOSCOPY (EGD) WITH PROPOFOL N/A 03/28/2021   Procedure: ESOPHAGOGASTRODUODENOSCOPY (EGD) WITH PROPOFOL;  Surgeon: Ronald Lobo, MD;  Location[facility];  Service: Endoscopy;  Laterality: N/A;   KNEE SURGERY Right 1980   KNEE SURGERY Left 1982   OTHER SURGICAL HISTORY  1992   stomach muscles removed due to Coumadin caused bleeding.   SKIN LESION EXCISION     Some cancerous   TONSILLECTOMY  1965   Social History:   reports that he has never smoked. His smokeless tobacco use includes snuff. He reports that he does not currently use alcohol. He reports that he does not use drugs.  Family History  Problem Relation Age of Onset   Heart disease Neg Hx     Medications: Patient's Medications  New Prescriptions   FLUTICASONE (FLONASE) 50 MCG/ACT NASAL SPRAY    Place 1 spray into both nostrils daily.  Previous Medications   ACETAMINOPHEN (TYLENOL) 500 MG TABLET    Take 500 mg by mouth daily as needed for moderate pain.   ALBUTEROL (VENTOLIN HFA) 108 (90 BASE) MCG/ACT INHALER    Inhale 1 puff into the lungs every 6 (six) hours as needed for wheezing or shortness of breath.   CARBAMIDE PEROXIDE (DEBROX) 6.5 % OTIC SOLUTION    Place 5 drops into the left ear 2 (two) times daily.   CHOLECALCIFEROL (VITAMIN D-3 PO)    Take 1 tablet by mouth 2 (two) times daily. Dose unknown   DOCUSATE SODIUM (COLACE) 100 MG CAPSULE    Take 100 mg by mouth as needed for mild constipation.   FUROSEMIDE (LASIX) 20 MG TABLET    Take 1 tablet (20 mg total) by mouth daily as needed for fluid or edema (3lb weight gain).   IRON, FERROUS SULFATE, 325 (65 FE) MG TABS    Take 325 mg by mouth in the morning and at bedtime.   LORATADINE (CLARITIN) 10 MG TABLET    Take 1 tablet (10 mg total) by mouth daily.   LORAZEPAM (ATIVAN) 0.5 MG TABLET    Take 0.5 tablets (0.25 mg total) by mouth daily as needed (for panic).   LOVASTATIN (MEVACOR) 40 MG TABLET    TAKE ONE TABLET BY MOUTH AT BEDTIME   PANTOPRAZOLE  (PROTONIX) 40 MG TABLET    Take 1 tablet (40 mg total) by mouth 2 (two) times daily.   PROPYLENE GLYCOL (SYSTANE BALANCE) 0.6 % SOLN    Apply 1 drop to eye as needed (for dry eye).   RAMIPRIL (ALTACE) 10 MG CAPSULE    Take 2 capsules (20 mg total) by mouth daily.   VITAMIN B-12 (CYANOCOBALAMIN) 1000 MCG TABLET    Take 1,000 mcg by mouth daily.   WARFARIN (COUMADIN) 3 MG TABLET    TAKE ONE TABLET BY MOUTH DAILY AT 4PM  Modified Medications   Modified Medication Previous Medication   TRAZODONE (DESYREL) 100 MG TABLET traZODone (DESYREL)  50 MG tablet      Take 1 tablet (100 mg total) by mouth at bedtime.    Take 1.5 tablets (75 mg total) by mouth at bedtime.  Discontinued Medications   No medications on file    Physical Exam:  Vitals:   11/30/21 0908  BP: 138/72  Pulse: 76  Temp: (!) 97.3 F (36.3 C)  TempSrc: Temporal  SpO2: 98%  Weight: 176 lb (79.8 kg)  Height: 5\' 7"  (1.702 m)   Body mass index is 27.57 kg/m. Wt Readings from Last 3 Encounters:  11/30/21 176 lb (79.8 kg)  10/15/21 177 lb 3.2 oz (80.4 kg)  10/07/21 174 lb 12.8 oz (79.3 kg)    Physical Exam Constitutional:      General: He is not in acute distress.    Appearance: Normal appearance. He is normal weight. He is not ill-appearing.  HENT:     Right Ear: Tympanic membrane, ear canal and external ear normal.     Left Ear: Tympanic membrane, ear canal and external ear normal.     Nose: Congestion present.     Comments: Left nostril sounds congested when inhaled. Right nostril is clear.     Mouth/Throat:     Mouth: Mucous membranes are moist.     Pharynx: Oropharynx is clear. No oropharyngeal exudate or posterior oropharyngeal erythema.  Eyes:     Conjunctiva/sclera: Conjunctivae normal.  Cardiovascular:     Rate and Rhythm: Normal rate and regular rhythm.     Pulses: Normal pulses.     Heart sounds: Normal heart sounds.  Pulmonary:     Effort: Pulmonary effort is normal.     Breath sounds: Normal breath  sounds.  Abdominal:     General: Bowel sounds are normal.  Musculoskeletal:        General: Signs of injury present.     Cervical back: Normal range of motion.     Comments: Right hip pain, left metatarsals. Radiates up left leg.   Neurological:     Mental Status: He is alert and oriented to person, place, and time.  Psychiatric:        Behavior: Behavior normal.        Thought Content: Thought content normal.        Judgment: Judgment normal.     Comments: Pt is seeking another therapist     Labs reviewed: Basic Metabolic Panel: Recent Labs    03/28/21 0905 04/18/21 1607 04/19/21 0438 04/20/21 0513 04/21/21 0213 04/27/21 1030 10/07/21 1235  NA 138   < > 136   < > 133* 136 139  K 4.2   < > 4.2   < > 3.8 4.2 4.4  CL 109   < > 110   < > 105 107 106  CO2 26   < > 21*   < > 22 22 21   GLUCOSE 94   < > 95   < > 104* 96 80  BUN 16   < > 28*   < > 18 16 18   CREATININE 1.30*   < > 1.40*   < > 1.39* 1.19* 1.33*  CALCIUM 8.1*   < > 8.5*   < > 8.7* 8.8 8.9  MG 1.7  --  1.8  --   --   --   --   PHOS 3.2  --  3.6  --   --   --   --    < > = values in this interval not displayed.  Liver Function Tests: Recent Labs    03/27/21 1532 04/18/21 1607 04/19/21 0438 04/27/21 1030 10/07/21 1235  AST 17 15 12* 16 17  ALT 10 11 9 9 9   ALKPHOS 78 77 67  --   --   BILITOT 0.5 0.5 0.5 0.3 0.4  PROT 6.7 5.5* 4.6* 5.6* 6.0*  ALBUMIN 3.3* 2.9* 2.5*  --   --    No results for input(s): LIPASE, AMYLASE in the last 8760 hours. No results for input(s): AMMONIA in the last 8760 hours. CBC: Recent Labs    04/27/21 1030 05/19/21 1159 10/07/21 1235  WBC 5.8 5.3 5.2  NEUTROABS 3,706 3,535 3,333  HGB 10.9* 11.7* 14.6  HCT 35.4* 38.8 43.8  MCV 82.1 85.7 89.4  PLT 341 285 235   Lipid Panel: Recent Labs    03/09/21 1222  CHOL 185  HDL 67  LDLCALC 91  TRIG 171*  CHOLHDL 2.8   TSH: No results for input(s): TSH in the last 8760 hours. A1C: No results found for:  HGBA1C   Assessment/Plan 1. Hx of long term use of blood thinners Due to hx of AVR, INR goal 2.5-3.5 At goal on coumadin 3 mg daily - POC INR  2. 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 H/O aortic valve replacement -continue on coumadin for anticoagulation. No worsening shortness of breath, chest   4. Alcohol dependence in remission ([place]) 8 months without ETOH this month.   5. Aortic atherosclerosis ([place]) Continues on coumadin and statin   6. Allergic rhinitis, unspecified seasonality, unspecified trigger Ongoing, continues on claritin. Ca add flonase - fluticasone (FLONASE) 50 MCG/ACT nasal spray; Place 1 spray into both nostrils daily.  Dispense: 16 g; Refill: 6  7. Primary insomnia - has been doing much better on trazodone 100 mg daily. Continue with lifestyle modifications.  - traZODone (DESYREL) 100 MG tablet; Take 1 tablet (100 mg total) by mouth at bedtime.  Dispense: 90 tablet; Refill: 1  8. Anxiety -ongoing but stable continues to meet with counselor.   9. Essential hypertension -Blood pressure well controlled Continue current medications Recheck metabolic panel     Next appt: Return in about 4 weeks (around 12/28/2021) for routine follow up/INR.  Garrett Terry. [place], [facility] [phone]

## 2021-11-30 NOTE — Patient Instructions (Signed)
Okay to use debrox Okay to increase trazodone 2 tablets at bedtime.  INR is in range. Continue current coumadin 3 mg daily    [facility]  [address] [place], [place] [address] Phone: [facility] [address] [place], [place] [address] Phone: [phone]  [facility] [address] [place], [place] [address] Phone [phone]  [facility] [place], [place], [place], [place] [address], [address] [place], [place] [address] [phone] In home visit available with prior arrangement

## 2021-12-23 ENCOUNTER — Encounter: Payer: Medicare Other | Admitting: Physician Assistant

## 2021-12-28 ENCOUNTER — Ambulatory Visit (INDEPENDENT_AMBULATORY_CARE_PROVIDER_SITE_OTHER): Payer: Medicare Other | Admitting: Nurse Practitioner

## 2021-12-28 ENCOUNTER — Encounter: Payer: Self-pay | Admitting: Nurse Practitioner

## 2021-12-28 ENCOUNTER — Other Ambulatory Visit: Payer: Self-pay

## 2021-12-28 VITALS — BP 128/84 | HR 82 | Temp 97.3°F | Ht 67.0 in | Wt 180.0 lb

## 2021-12-28 DIAGNOSIS — D6869 Other thrombophilia: Secondary | ICD-10-CM

## 2021-12-28 DIAGNOSIS — Z952 Presence of prosthetic heart valve: Secondary | ICD-10-CM

## 2021-12-28 DIAGNOSIS — Z7901 Long term (current) use of anticoagulants: Secondary | ICD-10-CM

## 2021-12-28 LAB — POCT INR: INR: 3.2 — AB (ref 2.0–3.0)

## 2021-12-28 NOTE — Patient Instructions (Signed)
Continue coumadin 3 mg

## 2021-12-28 NOTE — Progress Notes (Signed)
Careteam: Patient Care Team: [name], NP as PCP - General (Geriatric Medicine)  PLACE OF SERVICE:  [place] Directive information Does Patient Have a Medical Advance Directive?: Yes, Type of Advance Directive: [place];Living will;Out of facility DNR (pink MOST or yellow form), Pre-existing out of facility DNR order (yellow form or pink MOST form): Yellow form placed in chart (order not valid for inpatient use), Does patient want to make changes to medical advance directive?: No - Patient declined  Allergies  Allergen Reactions   Aspirin     Other reaction(s): Other (See Comments) Can't take due to taking blood thinners   Morphine And Related Other (See Comments)    Other reaction(s): Other (See Comments) Hallucinations     Nitroglycerin     Severe headache   Promethazine Other (See Comments)    Other reaction(s): Other (See Comments) Hallucinations    Promethazine Hcl Other (See Comments)    Chief Complaint  Patient presents with   Anticoagulation    4 week PT/INR check. No missed doses, no diet changes, and no bleeding. 3.2 today     HPI: Patient is a 76 y.o. male for INR check  Taking coumadin 3 mg daily. No missed doses.  No abnormal bruising or bleeding.   Plans to get shot in both hips due to pain  Had hand injection that bled a little more than expected  Plans to have some skin lesion removed, having skin surgery center remove.  Reports he is having problems in left and right eye were there is a pattern and very similar to when he had an aura with his headaches.  He still is having floaters and feels like he is having trouble with his vision.  Headaches are better. No frequent headaches.  Vision is clouded.  Looking for another option.  Review of Systems:  Review of Systems  Constitutional:  Negative for chills and fever.  Eyes:  Positive for blurred vision. Negative for double vision and pain.  Respiratory:   Negative for shortness of breath.   Cardiovascular:  Negative for chest pain and leg swelling.  Musculoskeletal:  Positive for joint pain and myalgias.  Neurological:  Positive for headaches. Negative for dizziness and weakness.   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[place]) 03/28/2021   Closed compression fracture of body of L1 vertebra ([place]) 9/89/2119   Complication of anesthesia    PONV   Compression fracture of T12 vertebra ([facility]) 03/28/2021   Depression    History of bladder cancer    History of pneumonia 1959   History of prostate cancer    History of scarlet fever 1956   History of stroke 2009   Hx of long term use of blood thinners    Hypertension    Long term current use of anticoagulant 03/22/2011   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   Past Surgical History:  Procedure Laterality Date   AORTIC VALVE REPLACEMENT     mechanical   BIOPSY  03/28/2021   Procedure: BIOPSY;  Surgeon: Ronald Lobo, MD;  [facility];  Service: Endoscopy;;   BLADDER SURGERY  1983   Bladder Cancer Surgery    ESOPHAGOGASTRODUODENOSCOPY (EGD) WITH PROPOFOL N/A 03/28/2021   Procedure: ESOPHAGOGASTRODUODENOSCOPY (EGD) WITH PROPOFOL;  Surgeon: Ronald Lobo, MD;  [facility];  Service: Endoscopy;  Laterality: N/A;   KNEE SURGERY Right 1980   KNEE SURGERY Left 1982   OTHER SURGICAL  HISTORY  1992   stomach muscles removed due to Coumadin caused bleeding.   SKIN LESION EXCISION     Some cancerous   TONSILLECTOMY  1965   Social History:   reports that he has never smoked. His smokeless tobacco use includes snuff. He reports that he does not currently use alcohol. He reports that he does not use drugs.  Family History  Problem Relation Age of Onset   Heart disease Neg Hx     Medications: Patient's Medications  New Prescriptions   No medications on file  Previous Medications   ACETAMINOPHEN (TYLENOL) 500 MG TABLET    Take 500 mg by mouth daily as  needed for moderate pain.   ALBUTEROL (VENTOLIN HFA) 108 (90 BASE) MCG/ACT INHALER    Inhale 1 puff into the lungs every 6 (six) hours as needed for wheezing or shortness of breath.   CARBAMIDE PEROXIDE (DEBROX) 6.5 % OTIC SOLUTION    Place 5 drops into the left ear 2 (two) times daily.   CHOLECALCIFEROL (VITAMIN D-3 PO)    Take 1 tablet by mouth 2 (two) times daily. Dose unknown   DOCUSATE SODIUM (COLACE) 100 MG CAPSULE    Take 100 mg by mouth as needed for mild constipation.   FLUTICASONE (FLONASE) 50 MCG/ACT NASAL SPRAY    Place 1 spray into both nostrils daily.   FUROSEMIDE (LASIX) 20 MG TABLET    Take 1 tablet (20 mg total) by mouth daily as needed for fluid or edema (3lb weight gain).   IRON, FERROUS SULFATE, 325 (65 FE) MG TABS    Take 325 mg by mouth in the morning and at bedtime.   LORATADINE (CLARITIN) 10 MG TABLET    Take 1 tablet (10 mg total) by mouth daily.   LOVASTATIN (MEVACOR) 40 MG TABLET    TAKE ONE TABLET BY MOUTH AT BEDTIME   PANTOPRAZOLE (PROTONIX) 40 MG TABLET    Take 1 tablet (40 mg total) by mouth 2 (two) times daily.   PROPYLENE GLYCOL (SYSTANE BALANCE) 0.6 % SOLN    Apply 1 drop to eye as needed (for dry eye).   RAMIPRIL (ALTACE) 10 MG CAPSULE    Take 2 capsules (20 mg total) by mouth daily.   TRAZODONE (DESYREL) 100 MG TABLET    Take 1 tablet (100 mg total) by mouth at bedtime.   VITAMIN B-12 (CYANOCOBALAMIN) 1000 MCG TABLET    Take 1,000 mcg by mouth daily.   WARFARIN (COUMADIN) 3 MG TABLET    TAKE ONE TABLET BY MOUTH DAILY AT 4PM  Modified Medications   No medications on file  Discontinued Medications   LORAZEPAM (ATIVAN) 0.5 MG TABLET    Take 0.5 tablets (0.25 mg total) by mouth daily as needed (for panic).    Physical Exam:  Vitals:   12/28/21 1059  BP: 128/84  Pulse: 82  Temp: (!) 97.3 F (36.3 C)  TempSrc: Temporal  SpO2: 99%  Weight: 180 lb (81.6 kg)  Height: 5\' 7"  (1.702 m)   Body mass index is 28.19 kg/m. Wt Readings from Last 3 Encounters:   12/28/21 180 lb (81.6 kg)  11/30/21 176 lb (79.8 kg)  10/15/21 177 lb 3.2 oz (80.4 kg)    Physical Exam Constitutional:      General: He is not in acute distress.    Appearance: He is well-developed. He is not diaphoretic.  HENT:     Head: Normocephalic and atraumatic.  Eyes:     Conjunctiva/sclera: Conjunctivae normal.  Pupils: Pupils are equal, round, and reactive to light.  Cardiovascular:     Rate and Rhythm: Normal rate and regular rhythm.     Heart sounds: Murmur heard.  Pulmonary:     Effort: Pulmonary effort is normal.     Breath sounds: Normal breath sounds.  Musculoskeletal:     Cervical back: Normal range of motion and neck supple.     Right lower leg: No edema.     Left lower leg: No edema.  Skin:    General: Skin is warm and dry.  Neurological:     Mental Status: He is alert and oriented to person, place, and time.    Labs reviewed: Basic Metabolic Panel: Recent Labs    03/28/21 0905 04/18/21 1607 04/19/21 0438 04/20/21 0513 04/21/21 0213 04/27/21 1030 10/07/21 1235  NA 138   < > 136   < > 133* 136 139  K 4.2   < > 4.2   < > 3.8 4.2 4.4  CL 109   < > 110   < > 105 107 106  CO2 26   < > 21*   < > 22 22 21   GLUCOSE 94   < > 95   < > 104* 96 80  BUN 16   < > 28*   < > 18 16 18   CREATININE 1.30*   < > 1.40*   < > 1.39* 1.19* 1.33*  CALCIUM 8.1*   < > 8.5*   < > 8.7* 8.8 8.9  MG 1.7  --  1.8  --   --   --   --   PHOS 3.2  --  3.6  --   --   --   --    < > = values in this interval not displayed.   Liver Function Tests: Recent Labs    03/27/21 1532 04/18/21 1607 04/19/21 0438 04/27/21 1030 10/07/21 1235  AST 17 15 12* 16 17  ALT 10 11 9 9 9   ALKPHOS 78 77 67  --   --   BILITOT 0.5 0.5 0.5 0.3 0.4  PROT 6.7 5.5* 4.6* 5.6* 6.0*  ALBUMIN 3.3* 2.9* 2.5*  --   --    No results for input(s): LIPASE, AMYLASE in the last 8760 hours. No results for input(s): AMMONIA in the last 8760 hours. CBC: Recent Labs    04/27/21 1030 05/19/21 1159  10/07/21 1235  WBC 5.8 5.3 5.2  NEUTROABS 3,706 3,535 3,333  HGB 10.9* 11.7* 14.6  HCT 35.4* 38.8 43.8  MCV 82.1 85.7 89.4  PLT 341 285 235   Lipid Panel: Recent Labs    03/09/21 1222  CHOL 185  HDL 67  LDLCALC 91  TRIG 171*  CHOLHDL 2.8   TSH: No results for input(s): TSH in the last 8760 hours. A1C: No results found for: HGBA1C   Assessment/Plan 1. Acquired thrombophilia ([facility]) Due to aortic valve replacement. Currently on coumadin.  - POC INR  2. Long-term (current) use of anticoagulants, INR goal 2.5-3.5 - POC INR at goal 3.2  3. H/O aortic valve replacement Stable. Continues on coumadin. Without chest pains, shortness of breath or LE edema. Did not make cardiologist appt when referral was made last year. Would like another referral at this time.  - Ambulatory referral to Cardiology    Return in about 4 weeks (around 01/25/2022) for INR. Carlos American. [place], [facility] [phone]

## 2022-01-11 ENCOUNTER — Ambulatory Visit (INDEPENDENT_AMBULATORY_CARE_PROVIDER_SITE_OTHER): Payer: Medicare Other | Admitting: Physician Assistant

## 2022-01-11 ENCOUNTER — Other Ambulatory Visit: Payer: Self-pay

## 2022-01-11 ENCOUNTER — Encounter: Payer: Self-pay | Admitting: Physician Assistant

## 2022-01-11 DIAGNOSIS — D225 Melanocytic nevi of trunk: Secondary | ICD-10-CM | POA: Diagnosis not present

## 2022-01-11 DIAGNOSIS — D229 Melanocytic nevi, unspecified: Secondary | ICD-10-CM

## 2022-01-11 DIAGNOSIS — D0430 Carcinoma in situ of skin of unspecified part of face: Secondary | ICD-10-CM

## 2022-01-11 DIAGNOSIS — D0439 Carcinoma in situ of skin of other parts of face: Secondary | ICD-10-CM | POA: Diagnosis not present

## 2022-01-11 NOTE — Patient Instructions (Signed)

## 2022-01-11 NOTE — Progress Notes (Signed)
? ?  Follow-Up Visit ?  ?Subjective  ?Garrett Terry is a 76 y.o. male who presents for the following: Procedure (Here to treat right temple( CIS) and right lower back. (Moderate atypia)). ? ? ?The following portions of the chart were reviewed this encounter and updated as appropriate:  Tobacco  Allergies  Meds  Problems  Med Hx  Surg Hx  Fam Hx   ?  ? ?Objective  ?Well appearing patient in no apparent distress; mood and affect are within normal limits. ? ?All skin waist up examined. ? ?Right Lower Back ?Pink macule ? ?Right Temple ?Lesion identified by Robyne Askew, PA and nurse in room.  Pink macule ? ? ?Assessment & Plan  ?Atypical mole ?Right Lower Back ? ?Epidermal / dermal shaving - Right Lower Back ? ?Lesion diameter (cm):  1.2 ?Informed consent: discussed and consent obtained   ?Timeout: patient name, date of birth, surgical site, and procedure verified   ?Procedure prep:  Patient was prepped and draped in usual sterile fashion ?Prep type:  Chlorhexidine ?Anesthesia: the lesion was anesthetized in a standard fashion   ?Anesthetic:  1% lidocaine w/ epinephrine 1-100,000 local infiltration ?Instrument used: DermaBlade   ?Hemostasis achieved with: ferric subsulfate and electrodesiccation   ?Outcome: patient tolerated procedure well   ?Post-procedure details: sterile dressing applied and wound care instructions given   ?Dressing type: petrolatum gauze, petrolatum and bandage   ? ?Specimen 1 - Surgical pathology ?Differential Diagnosis: R/O Atypia - wider shave (cautery) ?[id number] ?Check Margins: YES ? 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 ?Destruction of lesion ?Complexity: simple   ?Destruction method: electrodesiccation and curettage   ?Informed consent: discussed and consent obtained   ?Timeout:  patient name, date of birth, surgical site, and procedure verified ?Anesthesia: the lesion was anesthetized in a standard fashion   ?Anesthetic:  1% lidocaine w/  epinephrine 1-100,000 local infiltration ?Curettage performed in three different directions: Yes   ?Electrodesiccation performed over the curetted area: Yes   ?Curettage cycles:  3 ?Margin per side (cm):  0.1 ?Final wound size (cm):  1.1 ?Hemostasis achieved with:  ferric subsulfate and electrodesiccation ?Outcome: patient tolerated procedure well with no complications   ?Post-procedure details: wound care instructions given   ?Additional details:  Wound innoculated with 5 fluorouracil solution. ? ? ? ?I, Milah Recht, PA-C, have reviewed all documentation's for this visit.  The documentation on 01/11/22 for the exam, diagnosis, procedures and orders are all accurate and complete. ?

## 2022-01-18 ENCOUNTER — Telehealth: Payer: Self-pay | Admitting: *Deleted

## 2022-01-18 NOTE — Telephone Encounter (Signed)
Left patient a message for pathology results.  ?

## 2022-01-18 NOTE — Telephone Encounter (Signed)
-----   Message from Warren Danes, [place] sent at 01/18/2022 12:29 PM EDT ----- ?Clear margins RTC if recur. Recheck 6-9 months ?

## 2022-01-19 NOTE — Telephone Encounter (Signed)
Phone call from patient returning our call. Pathology results given to the patient.  ?

## 2022-01-19 NOTE — Telephone Encounter (Signed)
-----   Message from Warren Danes, [place] sent at 01/18/2022 12:29 PM EDT ----- ?Clear margins RTC if recur. Recheck 6-9 months ?

## 2022-01-21 ENCOUNTER — Encounter: Payer: Self-pay | Admitting: Cardiology

## 2022-01-21 NOTE — Progress Notes (Signed)
?  ?Cardiology Office Note ? ? ?Date:  01/22/2022  ? ?ID:  Garrett Terry, DOB June 02, 1946, MRN [id number] ? ?PCP:  [name], NP  ?Cardiologist:   None ?Referring:  [name], NP ? ?Chief Complaint  ?Patient presents with  ? AVR  ? ? ?  ?History of Present Illness: ?Garrett Terry is a 76 y.o. male who presents for follow up of AVR.    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  He has been on anticoagulation since that time.  This is his first visit with a cardiologist here and he is establishing.  However, he was in the hospital a couple of times last year.  He is a recovering alcoholic.  He has had sigmoid diverticulitis with resultant anemia.  He had acute renal insufficiency.  I reviewed the 2 hospitalizations.  I do see an echocardiogram from last year that demonstrated normal mechanical aortic valve replacement.  There was some aortic dilatation measuring 40 mm.  There were no other significant abnormalities. ? ?He actually feels well.  He denies any chest pressure, neck or arm discomfort.  He has had no palpitations, presyncope or syncope.  He is limited a little bit by hip pain.  However, he is able to do some pool exercises.  With this he denies any chest pressure, neck or arm discomfort.  He has had no further evidence of any GI bleeding. ? ? ?Past Medical History:  ?Diagnosis Date  ? Anxiety   ? 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 Atypical mole 11/18/2021  ? Right Lower Back (moderate with halo nevus effect) (wider shave)  ? Closed compression fracture of body of L1 vertebra ([place]) 03/28/2021  ? Complication of anesthesia   ? PONV  ? Compression fracture of T12 vertebra ([place]) 03/28/2021  ? Depression   ? History of bladder cancer   ? History of pneumonia 1959  ? History of prostate cancer   ? History of scarlet fever 1956  ? History of stroke 2009  ? Hypertension   ? Long term current use of  anticoagulant 03/22/2011  ? 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 Right Alar Crease  ? 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 Right Temple (in situ) (curet and 5FU)  ? ? ?Past Surgical History:  ?Procedure Laterality Date  ? 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 BIOPSY  03/28/2021  ? Procedure: BIOPSY;  Surgeon: Ronald Lobo, MD;  Location: [facility];  Service: Endoscopy;;  ? [place]  ? Bladder Cancer Surgery   ? ESOPHAGOGASTRODUODENOSCOPY (EGD) WITH PROPOFOL N/A 03/28/2021  ? Procedure: ESOPHAGOGASTRODUODENOSCOPY (EGD) WITH PROPOFOL;  Surgeon: Ronald Lobo, MD;  Location: [facility];  Service: Endoscopy;  Laterality: N/A;  ? KNEE SURGERY Right 1980  ? KNEE SURGERY Left 1982  ? OTHER SURGICAL HISTORY  1992  ? stomach muscles removed due to Coumadin caused bleeding.  ? SKIN LESION EXCISION    ? Some cancerous  ? TONSILLECTOMY  1965  ? ? ? ?Current Outpatient Medications  ?Medication Sig Dispense Refill  ? acetaminophen (TYLENOL) 500 MG tablet Take 500 mg by mouth daily as needed for moderate pain.    ? albuterol (VENTOLIN HFA) 108 (90 Base) MCG/ACT inhaler Inhale 1 puff into the lungs every 6 (six) hours as needed for wheezing or shortness of breath. 18 g  1  ? carbamide peroxide (DEBROX) 6.5 % OTIC solution Place 5 drops into the left ear 2 (two) times daily. 15 mL 0  ? Cholecalciferol (VITAMIN D-3 PO) Take 1 tablet by mouth 2 (two) times daily. Dose unknown    ? docusate sodium (COLACE) 100 MG capsule Take 100 mg by mouth as needed for mild constipation.    ? fluticasone (FLONASE) 50 MCG/ACT nasal spray Place 1 spray into both nostrils daily. 16 g 6  ? furosemide (LASIX) 20 MG tablet Take 1 tablet (20 mg total) by mouth daily as needed for fluid or edema (3lb weight gain).    ? Iron, Ferrous Sulfate, 325 (65 Fe) MG TABS Take 325 mg by mouth in the morning and at bedtime. 180 tablet 3  ? loratadine (CLARITIN) 10 MG tablet Take 1 tablet (10 mg  total) by mouth daily. 30 tablet 11  ? lovastatin (MEVACOR) 40 MG tablet TAKE ONE TABLET BY MOUTH AT BEDTIME 90 tablet 1  ? pantoprazole (PROTONIX) 40 MG tablet Take 1 tablet (40 mg total) by mouth 2 (two) times daily. 180 tablet 3  ? Propylene Glycol (SYSTANE BALANCE) 0.6 % SOLN Apply 1 drop to eye as needed (for dry eye).    ? ramipril (ALTACE) 10 MG capsule Take 2 capsules (20 mg total) by mouth daily. 180 capsule 1  ? traZODone (DESYREL) 100 MG tablet Take 1 tablet (100 mg total) by mouth at bedtime. 90 tablet 1  ? vitamin B-12 (CYANOCOBALAMIN) 1000 MCG tablet Take 1,000 mcg by mouth daily.    ? warfarin (COUMADIN) 3 MG tablet TAKE ONE TABLET BY MOUTH DAILY AT 4PM 30 tablet 5  ? ?No current facility-administered medications for this visit.  ? ? ?Allergies:   Aspirin, Morphine and related, Nitroglycerin, Promethazine, and Promethazine hcl  ? ? ?Social History:  The patient  reports that he has never smoked. His smokeless tobacco use includes snuff. He reports that he does not currently use alcohol. He reports that he does not use drugs.  ? ?Family History: Noncontributory ? ?ROS:  Please see the history of present illness.   Otherwise, review of systems are positive for none.   All other systems are reviewed and negative.  ? ? ?PHYSICAL EXAM: ?VS:  BP 106/76   Pulse 94   Ht '5\' 9"'  (1.753 m)   Wt 173 lb 6.4 oz (78.7 kg)   SpO2 97%   BMI 25.61 kg/m?  , BMI Body mass index is 25.61 kg/m?. ?GENERAL:  Well appearing ?HEENT:  Pupils equal round and reactive, fundi not visualized, oral mucosa unremarkable ?NECK:  No jugular venous distention, waveform within normal limits, carotid upstroke brisk and symmetric, no bruits, no thyromegaly ?LYMPHATICS:  No cervical, inguinal adenopathy ?LUNGS:  Clear to auscultation bilaterally ?BACK:  No CVA tenderness ?CHEST:  Unremarkable ?HEART:  PMI not displaced or sustained,S1 and mechanical noS2 within normal limits, no S3, no S4, no clicks, no rubs, no murmurs ?ABD:  Flat,  positive bowel sounds normal in frequency in pitch, no bruits, no rebound, no guarding, no midline pulsatile mass, no hepatomegaly, no splenomegaly ?EXT:  2 plus pulses throughout, no edema, no cyanosis no clubbing ?SKIN:  No rashes no nodules ?NEURO:  Cranial nerves II through XII grossly intact, motor grossly intact throughout ?PSYCH:  Cognitively intact, oriented to person place and time ? ? ? ?EKG:  EKG is ordered today. ?The ekg ordered today demonstrates NSR.   Rate 94, left axis deviation, interventricular conduction delay, premature  ventricular contractions, no change from previous ? ? ?Recent Labs: ?04/19/2021: Magnesium 1.8 ?10/07/2021: ALT 9; BUN 18; Creat 1.33; Hemoglobin 14.6; Platelets 235; Potassium 4.4; Sodium 139  ? ? ?Lipid Panel ?   ?Component Value Date/Time  ? CHOL 185 03/09/2021 1222  ? TRIG 171 (H) 03/09/2021 1222  ? HDL 67 03/09/2021 1222  ? CHOLHDL 2.8 03/09/2021 1222  ? [place] 91 03/09/2021 1222  ? ?  ? ?Wt Readings from Last 3 Encounters:  ?01/22/22 173 lb 6.4 oz (78.7 kg)  ?12/28/21 180 lb (81.6 kg)  ?11/30/21 176 lb (79.8 kg)  ?  ? ? ?Other studies Reviewed: ?Additional studies/ records that were reviewed today include: Hospital records. ?Review of the above records demonstrates:  Please see elsewhere in the note.   ? ? ?ASSESSMENT AND PLAN: ? ?AVR: The patient has had a stable aortic valve replacement.  He has his Coumadin followed by his primary provider.  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  He understands that I would like him to have his INR closer to 3 up to 3-1/2.  He understands endocarditis prophylaxis.  He would always needs bridging Lovenox.  No further imaging is indicated at this point.  I will be checking routine blood work including a CBC, be met. ? ?CKD: His last creatinine was 1.33.  We can follow this going forward. ? ? ?Current medicines are reviewed at length with the patient today.  The patient does  not have concerns regarding medicines. ? ?The following changes have been made:  no change ? ?Labs/ tests ordered today include:  ? ?Orders Placed This Encounter  ?Procedures  ? Basic metabolic panel  ? CBC  ? EKG 1

## 2022-01-22 ENCOUNTER — Other Ambulatory Visit: Payer: Self-pay

## 2022-01-22 ENCOUNTER — Encounter: Payer: Self-pay | Admitting: Cardiology

## 2022-01-22 ENCOUNTER — Ambulatory Visit (INDEPENDENT_AMBULATORY_CARE_PROVIDER_SITE_OTHER): Payer: Medicare Other | Admitting: Cardiology

## 2022-01-22 VITALS — BP 106/76 | HR 94 | Ht 69.0 in | Wt 173.4 lb

## 2022-01-22 DIAGNOSIS — Z952 Presence of prosthetic heart valve: Secondary | ICD-10-CM

## 2022-01-22 NOTE — Patient Instructions (Signed)
Medication Instructions:  ?Your physician recommends that you continue on your current medications as directed. Please refer to the Current Medication list given to you today. ? ?*If you need a refill on your cardiac medications before your next appointment, please call your pharmacy* ? ? ?Lab Work: ?BMET, CBC today ? ?If you have labs (blood work) drawn today and your tests are completely normal, you will receive your results only by: ?MyChart Message (if you have MyChart) OR ?A paper copy in the mail ?If you have any lab test that is abnormal or we need to change your treatment, we will call you to review the results. ? ?Follow-Up: ?At [facility], you and your health needs are our priority.  As part of our continuing mission to provide you with exceptional heart care, we have created designated Provider Care Teams.  These Care Teams include your primary Cardiologist (physician) and Advanced Practice Providers (APPs -  Physician Assistants and Nurse Practitioners) who all work together to provide you with the care you need, when you need it. ? ?We recommend signing up for the patient portal called "MyChart".  Sign up information is provided on this After Visit Summary.  MyChart is used to connect with patients for Virtual Visits (Telemedicine).  Patients are able to view lab/test results, encounter notes, upcoming appointments, etc.  Non-urgent messages can be sent to your provider as well.   ?To learn more about what you can do with MyChart, go to [email or web address].   ? ?Your next appointment:   ?12 month(s) ? ?The format for your next appointment:   ?In Person ? ?Provider:   ?Dr. Percival Spanish ? ?

## 2022-01-23 LAB — CBC
Hematocrit: 44.1 % (ref 37.5–51.0)
Hemoglobin: 14.8 g/dL (ref 13.0–17.7)
MCH: 30.1 pg (ref 26.6–33.0)
MCHC: 33.6 g/dL (ref 31.5–35.7)
MCV: 90 fL (ref 79–97)
Platelets: 265 10*3/uL (ref 150–450)
RBC: 4.92 x10E6/uL (ref 4.14–5.80)
RDW: 12.9 % (ref 11.6–15.4)
WBC: 9.8 10*3/uL (ref 3.4–10.8)

## 2022-01-23 LAB — BASIC METABOLIC PANEL
BUN/Creatinine Ratio: 12 (ref 10–24)
BUN: 18 mg/dL (ref 8–27)
CO2: 23 mmol/L (ref 20–29)
Calcium: 9.7 mg/dL (ref 8.6–10.2)
Chloride: 100 mmol/L (ref 96–106)
Creatinine, Ser: 1.46 mg/dL — ABNORMAL HIGH (ref 0.76–1.27)
Glucose: 75 mg/dL (ref 70–99)
Potassium: 5.4 mmol/L — ABNORMAL HIGH (ref 3.5–5.2)
Sodium: 137 mmol/L (ref 134–144)
eGFR: 50 mL/min/{1.73_m2} — ABNORMAL LOW (ref >59)

## 2022-01-25 ENCOUNTER — Encounter: Payer: Self-pay | Admitting: Nurse Practitioner

## 2022-01-25 ENCOUNTER — Other Ambulatory Visit: Payer: Self-pay

## 2022-01-25 ENCOUNTER — Ambulatory Visit (INDEPENDENT_AMBULATORY_CARE_PROVIDER_SITE_OTHER): Payer: Medicare Other | Admitting: Nurse Practitioner

## 2022-01-25 VITALS — BP 118/70 | HR 103 | Temp 97.1°F | Ht 69.0 in | Wt 175.0 lb

## 2022-01-25 DIAGNOSIS — D6869 Other thrombophilia: Secondary | ICD-10-CM | POA: Diagnosis not present

## 2022-01-25 DIAGNOSIS — Z952 Presence of prosthetic heart valve: Secondary | ICD-10-CM | POA: Diagnosis not present

## 2022-01-25 DIAGNOSIS — Z7901 Long term (current) use of anticoagulants: Secondary | ICD-10-CM

## 2022-01-25 LAB — POCT INR: INR: 2.2 (ref 2.0–3.0)

## 2022-01-25 MED ORDER — WARFARIN SODIUM 1 MG PO TABS: ORAL_TABLET | ORAL | 1 refills | Status: DC

## 2022-01-25 MED ORDER — WARFARIN SODIUM 3 MG PO TABS: ORAL_TABLET | ORAL | 5 refills | Status: DC

## 2022-01-25 NOTE — Patient Instructions (Signed)
To take half tablet of 1 mg along with 3 mg to = 3.5 mg total ?Will try to get to closer to 3.5 (goal now 3-3.5)  ? ?

## 2022-01-25 NOTE — Progress Notes (Signed)
? ? ?Careteam: ?Patient Care Team: ?[name], NP as PCP - General (Geriatric Medicine) ? ?PLACE OF SERVICE:  ?[facility]  ?Advanced Directive information ?  ? ?Allergies  ?Allergen Reactions  ? Aspirin   ?  Other reaction(s): Other (See Comments) ?Can't take due to taking blood thinners  ? Morphine And Related Other (See Comments)  ?  Other reaction(s): Other (See Comments) ?Hallucinations ? ?  ? Nitroglycerin   ?  Severe headache  ? Promethazine Other (See Comments)  ?  Other reaction(s): Other (See Comments) ?Hallucinations ?  ? Promethazine Hcl Other (See Comments)  ? ? ?Chief Complaint  ?Patient presents with  ? Anticoagulation  ?  PT/INR check. No missed doses, no bleeding, and no major diet changes. Per cardiologist range is 2.5-3.5 (preferred closer to 3.5). PT/INR today was 2.2  ? ? ? ?HPI: Patient is a 76 y.o. male for INR ?INR today 2.2, on coumadin 3 mg daily without missed doses ?He has been on 3 mg consistently having INR in range between 2.5-3.5 per cardiologist INR should be between 3-3.5 ?Previously INR 3.2 ?No dietary modification ?He is now taking Claritin daily  ?No abnormal bruising or bleeding ? ?Review of Systems:  ?Review of Systems  ?Constitutional:  Negative for chills, fever and weight loss.  ?Respiratory:  Negative for cough, sputum production and shortness of breath.   ?Cardiovascular:  Negative for chest pain, palpitations and leg swelling.  ?Gastrointestinal:  Negative for abdominal pain and blood in stool.  ?Genitourinary:  Negative for hematuria.  ?Skin: Negative.   ? ?Past Medical History:  ?Diagnosis Date  ? Anxiety   ? Aortic atherosclerosis ([place]) 03/28/2021  ? Atypical mole 11/18/2021  ? Right Lower Back (moderate with halo nevus effect) (wider shave)  ? Closed compression fracture of body of L1 vertebra ([place]) 03/28/2021  ? Complication of anesthesia   ? PONV  ? Compression fracture of T12 vertebra ([place]) 03/28/2021  ? Depression   ? History of bladder cancer   ? History  of pneumonia 1959  ? History of prostate cancer   ? History of scarlet fever 1956  ? History of stroke 2009  ? Hypertension   ? Long term current use of anticoagulant 03/22/2011  ? 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 Right Alar Crease  ? 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 Right Temple (in situ) (curet and 5FU)  ? ?Past Surgical History:  ?Procedure Laterality Date  ? 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 BIOPSY  03/28/2021  ? Procedure: BIOPSY;  Surgeon: Ronald Lobo, MD;  Location: [facility];  Service: Endoscopy;;  ? [place]  ? Bladder Cancer Surgery   ? ESOPHAGOGASTRODUODENOSCOPY (EGD) WITH PROPOFOL N/A 03/28/2021  ? Procedure: ESOPHAGOGASTRODUODENOSCOPY (EGD) WITH PROPOFOL;  Surgeon: Ronald Lobo, MD;  Location: [facility];  Service: Endoscopy;  Laterality: N/A;  ? KNEE SURGERY Right 1980  ? KNEE SURGERY Left 1982  ? OTHER SURGICAL HISTORY  1992  ? stomach muscles removed due to Coumadin caused bleeding.  ? SKIN LESION EXCISION    ? Some cancerous  ? TONSILLECTOMY  1965  ? ?Social History: ?  reports that he has never smoked. His smokeless tobacco use includes snuff. He reports that he does not currently use alcohol. He reports that he does not use drugs. ? ?Family History  ?Problem Relation Age of Onset  ? Heart disease Neg Hx   ? ? ?  Medications: ?Patient's Medications  ?New Prescriptions  ? No medications on file  ?Previous Medications  ? ACETAMINOPHEN (TYLENOL) 500 MG TABLET    Take 500 mg by mouth daily as needed for moderate pain.  ? ALBUTEROL (VENTOLIN HFA) 108 (90 BASE) MCG/ACT INHALER    Inhale 1 puff into the lungs every 6 (six) hours as needed for wheezing or shortness of breath.  ? CARBAMIDE PEROXIDE (DEBROX) 6.5 % OTIC SOLUTION    Place 5 drops into the left ear 2 (two) times daily.  ? CHOLECALCIFEROL (VITAMIN D-3 PO)    Take 1 tablet by mouth 2 (two) times daily. Dose unknown  ? DOCUSATE SODIUM (COLACE) 100 MG CAPSULE     Take 100 mg by mouth as needed for mild constipation.  ? FLUTICASONE (FLONASE) 50 MCG/ACT NASAL SPRAY    Place 1 spray into both nostrils daily.  ? FUROSEMIDE (LASIX) 20 MG TABLET    Take 1 tablet (20 mg total) by mouth daily as needed for fluid or edema (3lb weight gain).  ? IRON, FERROUS SULFATE, 325 (65 FE) MG TABS    Take 325 mg by mouth in the morning and at bedtime.  ? LORATADINE (CLARITIN) 10 MG TABLET    Take 1 tablet (10 mg total) by mouth daily.  ? LOVASTATIN (MEVACOR) 40 MG TABLET    TAKE ONE TABLET BY MOUTH AT BEDTIME  ? PANTOPRAZOLE (PROTONIX) 40 MG TABLET    Take 1 tablet (40 mg total) by mouth 2 (two) times daily.  ? PROPYLENE GLYCOL (SYSTANE BALANCE) 0.6 % SOLN    Apply 1 drop to eye as needed (for dry eye).  ? RAMIPRIL (ALTACE) 10 MG CAPSULE    Take 2 capsules (20 mg total) by mouth daily.  ? TRAZODONE (DESYREL) 100 MG TABLET    Take 1 tablet (100 mg total) by mouth at bedtime.  ? VITAMIN B-12 (CYANOCOBALAMIN) 1000 MCG TABLET    Take 1,000 mcg by mouth daily.  ? WARFARIN (COUMADIN) 3 MG TABLET    TAKE ONE TABLET BY MOUTH DAILY AT 4PM  ?Modified Medications  ? No medications on file  ?Discontinued Medications  ? No medications on file  ? ? ?Physical Exam: ? ?Vitals:  ? 01/25/22 1501  ?BP: 118/70  ?Pulse: (!) 103  ?Temp: (!) 97.1 ?F (36.2 ?C)  ?TempSrc: Temporal  ?SpO2: 97%  ?Weight: 175 lb (79.4 kg)  ?Height: '5\' 9"'$  (1.753 m)  ? ?Body mass index is 25.84 kg/m?. ?Wt Readings from Last 3 Encounters:  ?01/25/22 175 lb (79.4 kg)  ?01/22/22 173 lb 6.4 oz (78.7 kg)  ?12/28/21 180 lb (81.6 kg)  ? ? ?Physical Exam ?Constitutional:   ?   General: He is not in acute distress. ?   Appearance: He is well-developed. He is not diaphoretic.  ?HENT:  ?   Head: Normocephalic and atraumatic.  ?Eyes:  ?   Conjunctiva/sclera: Conjunctivae normal.  ?   Pupils: Pupils are equal, round, and reactive to light.  ?Cardiovascular:  ?   Rate and Rhythm: Normal rate and regular rhythm.  ?   Heart sounds: Murmur heard.   ?Pulmonary:  ?   Effort: Pulmonary effort is normal.  ?   Breath sounds: Normal breath sounds.  ?Musculoskeletal:  ?   Cervical back: Normal range of motion and neck supple.  ?   Right lower leg: No edema.  ?   Left lower leg: No edema.  ?Skin: ?   General: Skin is warm and dry.  ?  Neurological:  ?   Mental Status: He is alert and oriented to person, place, and time.  ? ? ?Labs reviewed: ?Basic Metabolic Panel: ?Recent Labs  ?  03/28/21 ?0905 04/18/21 ?1607 04/19/21 ?0438 04/20/21 ?3235 04/27/21 ?1030 10/07/21 ?1235 01/22/22 ?1539  ?NA 138   < > 136   < > 136 139 137  ?K 4.2   < > 4.2   < > 4.2 4.4 5.4*  ?CL 109   < > 110   < > 107 106 100  ?CO2 26   < > 21*   < > '22 21 23  '$ ?GLUCOSE 94   < > 95   < > 96 80 75  ?BUN 16   < > 28*   < > '16 18 18  '$ ?CREATININE 1.30*   < > 1.40*   < > 1.19* 1.33* 1.46*  ?CALCIUM 8.1*   < > 8.5*   < > 8.8 8.9 9.7  ?MG 1.7  --  1.8  --   --   --   --   ?PHOS 3.2  --  3.6  --   --   --   --   ? < > = values in this interval not displayed.  ? ?Liver Function Tests: ?Recent Labs  ?  03/27/21 ?1532 04/18/21 ?1607 04/19/21 ?0438 04/27/21 ?1030 10/07/21 ?1235  ?AST 17 15 12* 16 17  ?ALT '10 11 9 9 9  '$ ?ALKPHOS 78 77 67  --   --   ?BILITOT 0.5 0.5 0.5 0.3 0.4  ?PROT 6.7 5.5* 4.6* 5.6* 6.0*  ?ALBUMIN 3.3* 2.9* 2.5*  --   --   ? ?No results for input(s): LIPASE, AMYLASE in the last 8760 hours. ?No results for input(s): AMMONIA in the last 8760 hours. ?CBC: ?Recent Labs  ?  04/27/21 ?1030 05/19/21 ?1159 10/07/21 ?1235 01/22/22 ?1539  ?WBC 5.8 5.3 5.2 9.8  ?NEUTROABS [id number] 3,535 3,333  --   ?HGB 10.9* 11.7* 14.6 14.8  ?HCT 35.4* 38.8 43.8 44.1  ?MCV 82.1 85.7 89.4 90  ?PLT 341 285 235 265  ? ?Lipid Panel: ?Recent Labs  ?  03/09/21 ?5732  ?CHOL 185  ?HDL 67  ?[place] 91  ?TRIG 171*  ?CHOLHDL 2.8  ? ?TSH: ?No results for input(s): TSH in the last 8760 hours. ?A1C: ?No results found for: HGBA1C ? ? 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

## 2022-01-28 ENCOUNTER — Encounter: Payer: Self-pay | Admitting: *Deleted

## 2022-01-30 HISTORY — PX: SKIN SURGERY: SHX2413

## 2022-02-12 ENCOUNTER — Ambulatory Visit (INDEPENDENT_AMBULATORY_CARE_PROVIDER_SITE_OTHER): Payer: Medicare Other | Admitting: Nurse Practitioner

## 2022-02-12 ENCOUNTER — Encounter: Payer: Self-pay | Admitting: Nurse Practitioner

## 2022-02-12 VITALS — BP 138/82 | HR 89 | Temp 97.1°F | Ht 69.0 in | Wt 178.0 lb

## 2022-02-12 DIAGNOSIS — Z7901 Long term (current) use of anticoagulants: Secondary | ICD-10-CM

## 2022-02-12 DIAGNOSIS — F5101 Primary insomnia: Secondary | ICD-10-CM

## 2022-02-12 DIAGNOSIS — B351 Tinea unguium: Secondary | ICD-10-CM

## 2022-02-12 DIAGNOSIS — L600 Ingrowing nail: Secondary | ICD-10-CM | POA: Diagnosis not present

## 2022-02-12 DIAGNOSIS — J309 Allergic rhinitis, unspecified: Secondary | ICD-10-CM | POA: Diagnosis not present

## 2022-02-12 DIAGNOSIS — Z952 Presence of prosthetic heart valve: Secondary | ICD-10-CM

## 2022-02-12 LAB — POCT INR: INR: 2.4 (ref 2.0–3.0)

## 2022-02-12 MED ORDER — WARFARIN SODIUM 4 MG PO TABS: 4.0000 mg | ORAL_TABLET | Freq: Every day | ORAL | 0 refills | Status: DC

## 2022-02-12 NOTE — Patient Instructions (Addendum)
-  stop Claritin, start xyzal 5 mg (over the counter) daily for 4 weeks then switch back to claritin ?- to use nettipot daily (to get at pharmacy, over the counter)  to use in the evening.  ?Continue flonase ? ?INCREASE coumadin to 4 mg daily  ? ?Follow up INR in 2 weeks  ?

## 2022-02-12 NOTE — Progress Notes (Signed)
? ? ?Careteam: ?Patient Care Team: ?Garrett Chandler, NP as PCP - General (Geriatric Medicine) ? ?PLACE OF SERVICE:  ?[facility]  ?Advanced Directive information ?  ? ?Allergies  ?Allergen Reactions  ? Aspirin   ?  Other reaction(s): Other (See Comments) ?Can't take due to taking blood thinners  ? Morphine And Related Other (See Comments)  ?  Other reaction(s): Other (See Comments) ?Hallucinations ? ?  ? Nitroglycerin   ?  Severe headache  ? Promethazine Other (See Comments)  ?  Other reaction(s): Other (See Comments) ?Hallucinations ?  ? Promethazine Hcl Other (See Comments)  ? ? ?Chief Complaint  ?Patient presents with  ? Anticoagulation  ?  PT/INR check   ? Acute Visit  ?  Patient also c/o ear fullness. FYI seen cardiologist recently. Discuss referral to Dr.Patel for an ingrown toenail (left big toe)  ? ? ? ?HPI: Patient is a 76 y.o. male for 2 week INR check.  ?INR was low at last visit, 2.2, goal is 2.5-3.5 but cardiologist wants her INR to be between 3-3.5.  ?Coumadin was increased to 3.5 mg daily (up from 3 mg)  ?INR is 2.4 ? ?Ear fullness- lots of congestion and allergies. Using Flonase and Claritin.  ? 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  ? ?Left foot great toe- had ingrown nail removed years ago, no pain, redness or swelling but as part of toenail growing outward.  ? 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  ? ?Sleeping well at this time.  ? ? ?Review of Systems:  ?Review of Systems  ?Constitutional:  Negative for chills, fever and weight loss.  ?HENT:  Positive for congestion. Negative for ear discharge, ear pain, sore throat and tinnitus.   ?Respiratory:  Negative for cough, sputum production and shortness of breath.   ?Cardiovascular:  Negative for chest pain, palpitations and leg swelling.  ?Gastrointestinal:  Negative for abdominal pain, constipation, diarrhea and heartburn.  ?Genitourinary:  Negative for dysuria, frequency and urgency.  ?Musculoskeletal:  Negative for back pain,  falls, joint pain and myalgias.  ?Skin: Negative.   ?Neurological:  Negative for dizziness and headaches.  ?Endo/Heme/Allergies:  Positive for environmental allergies.  ?Psychiatric/Behavioral:  Negative for depression and memory loss. The patient does not have insomnia.   ? ?Past Medical History:  ?Diagnosis Date  ? Anxiety   ? Aortic atherosclerosis ([place]) 03/28/2021  ? Atypical mole 11/18/2021  ? Right Lower Back (moderate with halo nevus effect) (wider shave)  ? Closed compression fracture of body of L1 vertebra ([place]) 03/28/2021  ? Complication of anesthesia   ? PONV  ? Compression fracture of T12 vertebra ([place]) 03/28/2021  ? Depression   ? History of bladder cancer   ? History of pneumonia 1959  ? History of prostate cancer   ? History of scarlet fever 1956  ? History of stroke 2009  ? Hypertension   ? Long term current use of anticoagulant 03/22/2011  ? 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 Right Alar Crease  ? 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 Right Temple (in situ) (curet and 5FU)  ? ?Past Surgical History:  ?Procedure Laterality Date  ? 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 BIOPSY  03/28/2021  ? Procedure: BIOPSY;  Surgeon: Ronald Lobo, MD;  Location: [facility];  Service: Endoscopy;;  ? [place]  ? Bladder Cancer Surgery   ? ESOPHAGOGASTRODUODENOSCOPY (EGD) WITH PROPOFOL  N/A 03/28/2021  ? Procedure: ESOPHAGOGASTRODUODENOSCOPY (EGD) WITH PROPOFOL;  Surgeon: Ronald Lobo, MD;  Location: [facility];  Service: Endoscopy;  Laterality: N/A;  ? KNEE SURGERY Right 1980  ? KNEE SURGERY Left 1982  ? OTHER SURGICAL HISTORY  1992  ? stomach muscles removed due to Coumadin caused bleeding.  ? SKIN LESION EXCISION    ? Some cancerous  ? SKIN SURGERY  01/30/2022  ? Basal Cell on side of nose  ? TONSILLECTOMY  1965  ? ?Social History: ?  reports that he has never smoked. His smokeless tobacco use includes snuff. He reports that he does not currently  use alcohol. He reports that he does not use drugs. ? ?Family History  ?Problem Relation Age of Onset  ? Heart disease Neg Hx   ? ? ?Medications: ?Patient's Medications  ?New Prescriptions  ? No medications on file  ?Previous Medications  ? ACETAMINOPHEN (TYLENOL) 500 MG TABLET    Take 500 mg by mouth daily as needed for moderate pain.  ? ALBUTEROL (VENTOLIN HFA) 108 (90 BASE) MCG/ACT INHALER    Inhale 1 puff into the lungs every 6 (six) hours as needed for wheezing or shortness of breath.  ? CARBAMIDE PEROXIDE (DEBROX) 6.5 % OTIC SOLUTION    Place 5 drops into the left ear 2 (two) times daily.  ? CHOLECALCIFEROL (VITAMIN D-3 PO)    Take 1 tablet by mouth 2 (two) times daily. Dose unknown  ? DOCUSATE SODIUM (COLACE) 100 MG CAPSULE    Take 100 mg by mouth as needed for mild constipation.  ? FLUTICASONE (FLONASE) 50 MCG/ACT NASAL SPRAY    Place 1 spray into both nostrils daily.  ? FUROSEMIDE (LASIX) 20 MG TABLET    Take 1 tablet (20 mg total) by mouth daily as needed for fluid or edema (3lb weight gain).  ? IRON, FERROUS SULFATE, 325 (65 FE) MG TABS    Take 325 mg by mouth in the morning and at bedtime.  ? LORATADINE (CLARITIN) 10 MG TABLET    Take 1 tablet (10 mg total) by mouth daily.  ? LOVASTATIN (MEVACOR) 40 MG TABLET    TAKE ONE TABLET BY MOUTH AT BEDTIME  ? PANTOPRAZOLE (PROTONIX) 40 MG TABLET    Take 1 tablet (40 mg total) by mouth 2 (two) times daily.  ? PROPYLENE GLYCOL (SYSTANE BALANCE) 0.6 % SOLN    Apply 1 drop to eye as needed (for dry eye).  ? RAMIPRIL (ALTACE) 10 MG CAPSULE    Take 2 capsules (20 mg total) by mouth daily.  ? TRAZODONE (DESYREL) 100 MG TABLET    Take 1 tablet (100 mg total) by mouth at bedtime.  ? VITAMIN B-12 (CYANOCOBALAMIN) 1000 MCG TABLET    Take 1,000 mcg by mouth daily.  ? WARFARIN (COUMADIN) 1 MG TABLET    To take half tablet along with 3 mg to = 3.'5mg'$  coumadin  ? WARFARIN (COUMADIN) 3 MG TABLET    1 tablet by mouth To take along with half tablet of 1 mg to = 3.'5mg'$  coumadin   ?Modified Medications  ? No medications on file  ?Discontinued Medications  ? No medications on file  ? ? ?Physical Exam: ? ?Vitals:  ? 02/12/22 1020  ?BP: 138/82  ?Pulse: 89  ?Temp: (!) 97.1 ?F (36.2 ?C)  ?TempSrc: Temporal  ?SpO2: 98%  ?Weight: 178 lb (80.7 kg)  ?Height: '5\' 9"'$  (1.753 m)  ? ?Body mass index is 26.29 kg/m?. ?Wt Readings from Last 3 Encounters:  ?  02/12/22 178 lb (80.7 kg)  ?01/25/22 175 lb (79.4 kg)  ?01/22/22 173 lb 6.4 oz (78.7 kg)  ? ? ?Physical Exam ?Constitutional:   ?   General: He is not in acute distress. ?   Appearance: He is well-developed. He is not diaphoretic.  ?HENT:  ?   Head: Normocephalic and atraumatic.  ?   Mouth/Throat:  ?   Pharynx: No oropharyngeal exudate.  ?Eyes:  ?   Conjunctiva/sclera: Conjunctivae normal.  ?   Pupils: Pupils are equal, round, and reactive to light.  ?Cardiovascular:  ?   Rate and Rhythm: Normal rate and regular rhythm.  ?   Heart sounds: Murmur heard.  ?Pulmonary:  ?   Effort: Pulmonary effort is normal.  ?   Breath sounds: Normal breath sounds.  ?Abdominal:  ?   General: Bowel sounds are normal.  ?   Palpations: Abdomen is soft.  ?Musculoskeletal:     ?   General: No tenderness.  ?   Cervical back: Normal range of motion and neck supple.  ?Skin: ?   General: Skin is warm and dry.  ?Neurological:  ?   Mental Status: He is alert and oriented to person, place, and time.  ? ? ?Labs reviewed: ?Basic Metabolic Panel: ?Recent Labs  ?  03/28/21 ?0905 04/18/21 ?1607 04/19/21 ?0438 04/20/21 ?6237 04/27/21 ?1030 10/07/21 ?1235 01/22/22 ?1539  ?NA 138   < > 136   < > 136 139 137  ?K 4.2   < > 4.2   < > 4.2 4.4 5.4*  ?CL 109   < > 110   < > 107 106 100  ?CO2 26   < > 21*   < > '22 21 23  '$ ?GLUCOSE 94   < > 95   < > 96 80 75  ?BUN 16   < > 28*   < > '16 18 18  '$ ?CREATININE 1.30*   < > 1.40*   < > 1.19* 1.33* 1.46*  ?CALCIUM 8.1*   < > 8.5*   < > 8.8 8.9 9.7  ?MG 1.7  --  1.8  --   --   --   --   ?PHOS 3.2  --  3.6  --   --   --   --   ? < > = values in this interval not  displayed.  ? ?Liver Function Tests: ?Recent Labs  ?  03/27/21 ?1532 04/18/21 ?1607 04/19/21 ?0438 04/27/21 ?1030 10/07/21 ?1235  ?AST 17 15 12* 16 17  ?ALT '10 11 9 9 9  '$ ?ALKPHOS 78 77 67  --   --   ?BILITOT

## 2022-02-26 ENCOUNTER — Ambulatory Visit (INDEPENDENT_AMBULATORY_CARE_PROVIDER_SITE_OTHER): Payer: Medicare Other | Admitting: Nurse Practitioner

## 2022-02-26 ENCOUNTER — Encounter: Payer: Self-pay | Admitting: Podiatry

## 2022-02-26 ENCOUNTER — Ambulatory Visit (INDEPENDENT_AMBULATORY_CARE_PROVIDER_SITE_OTHER): Payer: Medicare Other | Admitting: Podiatry

## 2022-02-26 ENCOUNTER — Encounter: Payer: Self-pay | Admitting: Nurse Practitioner

## 2022-02-26 VITALS — BP 134/76 | HR 94 | Temp 97.1°F | Ht 69.0 in | Wt 179.0 lb

## 2022-02-26 DIAGNOSIS — Z952 Presence of prosthetic heart valve: Secondary | ICD-10-CM

## 2022-02-26 DIAGNOSIS — B351 Tinea unguium: Secondary | ICD-10-CM | POA: Diagnosis not present

## 2022-02-26 DIAGNOSIS — M79674 Pain in right toe(s): Secondary | ICD-10-CM

## 2022-02-26 DIAGNOSIS — M79675 Pain in left toe(s): Secondary | ICD-10-CM

## 2022-02-26 DIAGNOSIS — Z7901 Long term (current) use of anticoagulants: Secondary | ICD-10-CM | POA: Diagnosis not present

## 2022-02-26 LAB — POCT INR: INR: 5 — AB (ref 2.0–3.0)

## 2022-02-26 MED ORDER — CICLOPIROX 8 % EX SOLN: Freq: Every day | CUTANEOUS | 0 refills | Status: DC

## 2022-02-26 NOTE — Progress Notes (Signed)
? ? ?Careteam: ?Patient Care Team: ?[name], NP as PCP - General (Geriatric Medicine) ? ?PLACE OF SERVICE:  ?[facility]  ?Advanced Directive information ?  ? ?Allergies  ?Allergen Reactions  ? Aspirin   ?  Other reaction(s): Other (See Comments) ?Can't take due to taking blood thinners  ? Morphine And Related Other (See Comments)  ?  Other reaction(s): Other (See Comments) ?Hallucinations ? ?  ? Nitroglycerin   ?  Severe headache  ? Promethazine Other (See Comments)  ?  Other reaction(s): Other (See Comments) ?Hallucinations ?  ? Promethazine Hcl Other (See Comments)  ? ? ?Chief Complaint  ?Patient presents with  ? Anticoagulation  ?  PT/INR check   ? ? ? ?HPI: Patient is a 76 y.o. male for INR follow up ? ?When he was taking coumadin 3.5 INR was 2.4, he was increased to 4 mg daily and now INR 5.0 ?He has not had any changes in diet or medication.  ?GOAL INR 3-3.5 due to hx of aortic valve replacement.  ? ? ?Review of Systems:  ?Review of Systems  ?Constitutional:  Negative for chills, fever and malaise/fatigue.  ?Respiratory:  Negative for cough and hemoptysis.   ?Cardiovascular:  Negative for chest pain.  ?Gastrointestinal:  Negative for blood in stool.  ?Genitourinary:  Negative for hematuria.  ?Skin:  Negative for itching and rash.  ?Neurological:  Negative for dizziness and headaches.  ?Endo/Heme/Allergies:  Bruises/bleeds easily.  ? ?Past Medical History:  ?Diagnosis Date  ? Anxiety   ? Aortic atherosclerosis ([place]) 03/28/2021  ? Atypical mole 11/18/2021  ? Right Lower Back (moderate with halo nevus effect) (wider shave)  ? Closed compression fracture of body of L1 vertebra ([place]) 03/28/2021  ? Complication of anesthesia   ? PONV  ? Compression fracture of T12 vertebra ([place]) 03/28/2021  ? Depression   ? History of bladder cancer   ? History of pneumonia 1959  ? History of prostate cancer   ? History of scarlet fever 1956  ? History of stroke 2009  ? Hypertension   ? Long term current use of  anticoagulant 03/22/2011  ? 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 Right Alar Crease  ? 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 Right Temple (in situ) (curet and 5FU)  ? ?Past Surgical History:  ?Procedure Laterality Date  ? 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 BIOPSY  03/28/2021  ? Procedure: BIOPSY;  Surgeon: Ronald Lobo, MD;  Location: [facility];  Service: Endoscopy;;  ? [place]  ? Bladder Cancer Surgery   ? ESOPHAGOGASTRODUODENOSCOPY (EGD) WITH PROPOFOL N/A 03/28/2021  ? Procedure: ESOPHAGOGASTRODUODENOSCOPY (EGD) WITH PROPOFOL;  Surgeon: Ronald Lobo, MD;  Location: [facility];  Service: Endoscopy;  Laterality: N/A;  ? KNEE SURGERY Right 1980  ? KNEE SURGERY Left 1982  ? OTHER SURGICAL HISTORY  1992  ? stomach muscles removed due to Coumadin caused bleeding.  ? SKIN LESION EXCISION    ? Some cancerous  ? SKIN SURGERY  01/30/2022  ? Basal Cell on side of nose  ? TONSILLECTOMY  1965  ? ?Social History: ?  reports that he has never smoked. His smokeless tobacco use includes snuff. He reports that he does not currently use alcohol. He reports that he does not use drugs. ? ?Family History  ?Problem Relation Age of Onset  ? Heart disease Neg Hx   ? ? ?Medications: ?Patient's Medications  ?New  Prescriptions  ? No medications on file  ?Previous Medications  ? ACETAMINOPHEN (TYLENOL) 500 MG TABLET    Take 500 mg by mouth daily as needed for moderate pain.  ? ALBUTEROL (VENTOLIN HFA) 108 (90 BASE) MCG/ACT INHALER    Inhale 1 puff into the lungs every 6 (six) hours as needed for wheezing or shortness of breath.  ? CARBAMIDE PEROXIDE (DEBROX) 6.5 % OTIC SOLUTION    Place 5 drops into the left ear 2 (two) times daily.  ? CHOLECALCIFEROL (VITAMIN D-3 PO)    Take 1 tablet by mouth 2 (two) times daily. Dose unknown  ? DOCUSATE SODIUM (COLACE) 100 MG CAPSULE    Take 100 mg by mouth as needed for mild constipation.  ? FLUTICASONE (FLONASE) 50 MCG/ACT NASAL  SPRAY    Place 1 spray into both nostrils daily.  ? FUROSEMIDE (LASIX) 20 MG TABLET    Take 1 tablet (20 mg total) by mouth daily as needed for fluid or edema (3lb weight gain).  ? IRON, FERROUS SULFATE, 325 (65 FE) MG TABS    Take 325 mg by mouth in the morning and at bedtime.  ? LORATADINE (CLARITIN) 10 MG TABLET    Take 1 tablet (10 mg total) by mouth daily.  ? LOVASTATIN (MEVACOR) 40 MG TABLET    TAKE ONE TABLET BY MOUTH AT BEDTIME  ? PANTOPRAZOLE (PROTONIX) 40 MG TABLET    Take 1 tablet (40 mg total) by mouth 2 (two) times daily.  ? PROPYLENE GLYCOL (SYSTANE BALANCE) 0.6 % SOLN    Apply 1 drop to eye as needed (for dry eye).  ? RAMIPRIL (ALTACE) 10 MG CAPSULE    Take 2 capsules (20 mg total) by mouth daily.  ? TRAZODONE (DESYREL) 100 MG TABLET    Take 1 tablet (100 mg total) by mouth at bedtime.  ? VITAMIN B-12 (CYANOCOBALAMIN) 1000 MCG TABLET    Take 1,000 mcg by mouth daily.  ? WARFARIN (COUMADIN) 4 MG TABLET    Take 1 tablet (4 mg total) by mouth daily.  ?Modified Medications  ? No medications on file  ?Discontinued Medications  ? No medications on file  ? ? ?Physical Exam: ? ?Vitals:  ? 02/26/22 0930  ?BP: 134/76  ?Pulse: 94  ?Temp: (!) 97.1 ?F (36.2 ?C)  ?SpO2: 97%  ?Weight: 179 lb (81.2 kg)  ?Height: '5\' 9"'$  (1.753 m)  ? ?Body mass index is 26.43 kg/m?. ?Wt Readings from Last 3 Encounters:  ?02/26/22 179 lb (81.2 kg)  ?02/12/22 178 lb (80.7 kg)  ?01/25/22 175 lb (79.4 kg)  ? ? ?Physical Exam ?Constitutional:   ?   General: He is not in acute distress. ?   Appearance: He is well-developed. He is not diaphoretic.  ?HENT:  ?   Head: Normocephalic and atraumatic.  ?   Right Ear: External ear normal.  ?   Left Ear: External ear normal.  ?Eyes:  ?   Conjunctiva/sclera: Conjunctivae normal.  ?   Pupils: Pupils are equal, round, and reactive to light.  ?Cardiovascular:  ?   Rate and Rhythm: Normal rate and regular rhythm.  ?   Heart sounds: Murmur heard.  ?Pulmonary:  ?   Effort: Pulmonary effort is normal.  ?    Breath sounds: Normal breath sounds.  ?Abdominal:  ?   General: Bowel sounds are normal.  ?   Palpations: Abdomen is soft.  ?Musculoskeletal:     ?   General: No tenderness.  ?   Cervical back:  Normal range of motion and neck supple.  ?   Right lower leg: No edema.  ?   Left lower leg: No edema.  ?Skin: ?   General: Skin is warm and dry.  ?Neurological:  ?   Mental Status: He is alert and oriented to person, place, and time.  ?Psychiatric:     ?   Mood and Affect: Mood normal.  ? ? ?Labs reviewed: ?Basic Metabolic Panel: ?Recent Labs  ?  03/28/21 ?0905 04/18/21 ?1607 04/19/21 ?0438 04/20/21 ?3382 04/27/21 ?1030 10/07/21 ?1235 01/22/22 ?1539  ?NA 138   < > 136   < > 136 139 137  ?K 4.2   < > 4.2   < > 4.2 4.4 5.4*  ?CL 109   < > 110   < > 107 106 100  ?CO2 26   < > 21*   < > '22 21 23  '$ ?GLUCOSE 94   < > 95   < > 96 80 75  ?BUN 16   < > 28*   < > '16 18 18  '$ ?CREATININE 1.30*   < > 1.40*   < > 1.19* 1.33* 1.46*  ?CALCIUM 8.1*   < > 8.5*   < > 8.8 8.9 9.7  ?MG 1.7  --  1.8  --   --   --   --   ?PHOS 3.2  --  3.6  --   --   --   --   ? < > = values in this interval not displayed.  ? ?Liver Function Tests: ?Recent Labs  ?  03/27/21 ?1532 04/18/21 ?1607 04/19/21 ?0438 04/27/21 ?1030 10/07/21 ?1235  ?AST 17 15 12* 16 17  ?ALT '10 11 9 9 9  '$ ?ALKPHOS 78 77 67  --   --   ?BILITOT 0.5 0.5 0.5 0.3 0.4  ?PROT 6.7 5.5* 4.6* 5.6* 6.0*  ?ALBUMIN 3.3* 2.9* 2.5*  --   --   ? ?No results for input(s): LIPASE, AMYLASE in the last 8760 hours. ?No results for input(s): AMMONIA in the last 8760 hours. ?CBC: ?Recent Labs  ?  04/27/21 ?1030 05/19/21 ?1159 10/07/21 ?1235 01/22/22 ?1539  ?WBC 5.8 5.3 5.2 9.8  ?NEUTROABS [id number] 3,535 3,333  --   ?HGB 10.9* 11.7* 14.6 14.8  ?HCT 35.4* 38.8 43.8 44.1  ?MCV 82.1 85.7 89.4 90  ?PLT 341 285 235 265  ? ?Lipid Panel: ?Recent Labs  ?  03/09/21 ?5053  ?CHOL 185  ?HDL 67  ?[place] 91  ?TRIG 171*  ?CHOLHDL 2.8  ? ?TSH: ?No results for input(s): TSH in the last 8760 hours. ?A1C: ?No results found for:  HGBA1C ? ? ?Assessment/Plan ?1. Long-term (current) use of anticoagulants, INR goal 3-3.5 ?-INR now at 5 up from 2.4, no abnormal bruising or bleeding, reports no changes in medication or diet.  ?will decrease coumadin to

## 2022-02-26 NOTE — Patient Instructions (Addendum)
Decrease coumadin to 2 mg daily for 3 days then start coumadin 3.5 mg daily ?Follow up INR in 1 week  ? ?

## 2022-03-04 NOTE — Progress Notes (Signed)
?  Subjective:  ?Patient ID: Garrett Terry, male    DOB: [birth year],  MRN: [id number] ? ?Chief Complaint  ?Patient presents with  ? Nail Problem  ? ?76 y.o. male returns for the above complaint.  Patient presents with thickened elongated dystrophic toenails x10.  Patient is on blood thinner.  She would like for me to debride down.  She is not able to do it herself.  She would also like to discuss treatment options for nail fungus.  She does not take anything for it. ? ?Objective:  ?There were no vitals filed for this visit. ?Podiatric Exam: ?Vascular: dorsalis pedis and posterior tibial pulses are palpable bilateral. Capillary return is immediate. Temperature gradient is WNL. Skin turgor WNL  ?Sensorium: Normal Semmes Weinstein monofilament test. Normal tactile sensation bilaterally. ?Nail Exam: Pt has thick disfigured discolored nails with subungual debris noted bilateral entire nail hallux through fifth toenails.  Pain on palpation to the nails. ?Ulcer Exam: There is no evidence of ulcer or pre-ulcerative changes or infection. ?Orthopedic Exam: Muscle tone and strength are WNL. No limitations in general ROM. No crepitus or effusions noted.  ?Skin: No Porokeratosis. No infection or ulcers ? ? ? ?Assessment & Plan:  ? ?1. Pain due to onychomycosis of toenails of both feet   ? ? ?Patient was evaluated and treated and all questions answered. ? ?Onychomycosis with pain  ?-Nails palliatively debrided as below. ?-Educated on self-care ?-[place] was sent to help with nail fungus. ? ?Procedure: Nail Debridement ?Rationale: pain  ?Type of Debridement: manual, sharp debridement. ?Instrumentation: Nail nipper, rotary burr. ?Number of Nails: 10 ? ?Procedures and Treatment: Consent by patient was obtained for treatment procedures. The patient understood the discussion of treatment and procedures well. All questions were answered thoroughly reviewed. Debridement of mycotic and hypertrophic toenails, 1 through 5 bilateral and  clearing of subungual debris. No ulceration, no infection noted.  ?Return Visit-Office Procedure: Patient instructed to return to the office for a follow up visit 3 months for continued evaluation and treatment. ? ?Boneta Lucks, DPM ?  ? ?Return in about 3 months (around 05/28/2022). ? ?

## 2022-03-04 NOTE — Progress Notes (Signed)
? ? ?Careteam: ?Patient Care Team: ?[name], NP as PCP - General (Geriatric Medicine) ? ?PLACE OF SERVICE:  ?[facility]  ?Advanced Directive information ?  ? ?Allergies  ?Allergen Reactions  ? Aspirin   ?  Other reaction(s): Other (See Comments) ?Can't take due to taking blood thinners  ? Morphine And Related Other (See Comments)  ?  Other reaction(s): Other (See Comments) ?Hallucinations ? ?  ? Nitroglycerin   ?  Severe headache  ? Promethazine Other (See Comments)  ?  Other reaction(s): Other (See Comments) ?Hallucinations ?  ? Promethazine Hcl Other (See Comments)  ? ? ?Chief Complaint  ?Patient presents with  ? Follow-up  ?  1 week follow up for INR check. Patient is on last day of prednisone. Patient has not missed any doses of medication.  ? ? ? ?HPI: Patient is a 76 y.o. male to follow up in INR.  ?Last INR was 5.0 on coumadin 4 mg daily ?Coumadin decreased to 3.5 mg daily and taking this daily, he is on prednisone.  ?Todays INR 2.9, goal 3-3.5 ?No unusual bruising or bleeding.  ? ? ?Review of Systems:  ?Review of Systems  ?Constitutional:  Negative for chills and fever.  ?HENT:  Negative for tinnitus.   ?Respiratory:  Negative for hemoptysis and sputum production.   ?Cardiovascular:  Negative for chest pain, palpitations and leg swelling.  ?Gastrointestinal:  Negative for abdominal pain, blood in stool, heartburn and melena.  ?Genitourinary:  Negative for dysuria, frequency and urgency.  ?Musculoskeletal:  Positive for joint pain and myalgias. Negative for back pain and falls.  ?Skin: Negative.   ?Neurological:  Negative for dizziness and headaches.  ? ?Past Medical History:  ?Diagnosis Date  ? Anxiety   ? Aortic atherosclerosis ([place]) 03/28/2021  ? Atypical mole 11/18/2021  ? Right Lower Back (moderate with halo nevus effect) (wider shave)  ? Closed compression fracture of body of L1 vertebra ([place]) 03/28/2021  ? Complication of anesthesia   ? PONV  ? Compression fracture of T12 vertebra ([place])  03/28/2021  ? Depression   ? History of bladder cancer   ? History of pneumonia 1959  ? History of prostate cancer   ? History of scarlet fever 1956  ? History of stroke 2009  ? Hypertension   ? Long term current use of anticoagulant 03/22/2011  ? 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 Right Alar Crease  ? 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 Right Temple (in situ) (curet and 5FU)  ? ?Past Surgical History:  ?Procedure Laterality Date  ? 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 BIOPSY  03/28/2021  ? Procedure: BIOPSY;  Surgeon: Ronald Lobo, MD;  Location: [facility];  Service: Endoscopy;;  ? [place]  ? Bladder Cancer Surgery   ? ESOPHAGOGASTRODUODENOSCOPY (EGD) WITH PROPOFOL N/A 03/28/2021  ? Procedure: ESOPHAGOGASTRODUODENOSCOPY (EGD) WITH PROPOFOL;  Surgeon: Ronald Lobo, MD;  Location: [facility];  Service: Endoscopy;  Laterality: N/A;  ? KNEE SURGERY Right 1980  ? KNEE SURGERY Left 1982  ? OTHER SURGICAL HISTORY  1992  ? stomach muscles removed due to Coumadin caused bleeding.  ? SKIN LESION EXCISION    ? Some cancerous  ? SKIN SURGERY  01/30/2022  ? Basal Cell on side of nose  ? TONSILLECTOMY  1965  ? ?Social History: ?  reports that he has never smoked. His smokeless tobacco use includes snuff. He reports that  he does not currently use alcohol. He reports that he does not use drugs. ? ?Family History  ?Problem Relation Age of Onset  ? Heart disease Neg Hx   ? ? ?Medications: ?Patient's Medications  ?New Prescriptions  ? No medications on file  ?Previous Medications  ? ACETAMINOPHEN (TYLENOL) 500 MG TABLET    Take 500 mg by mouth daily as needed for moderate pain.  ? ALBUTEROL (VENTOLIN HFA) 108 (90 BASE) MCG/ACT INHALER    Inhale 1 puff into the lungs every 6 (six) hours as needed for wheezing or shortness of breath.  ? CARBAMIDE PEROXIDE (DEBROX) 6.5 % OTIC SOLUTION    Place 5 drops into the left ear 2 (two) times daily.  ? CHOLECALCIFEROL  (VITAMIN D-3 PO)    Take 1 tablet by mouth 2 (two) times daily. Dose unknown  ? CICLOPIROX (PENLAC) 8 % SOLUTION    Apply topically at bedtime. Apply over nail and surrounding skin. Apply daily over previous coat. After seven (7) days, may remove with alcohol and continue cycle.  ? DOCUSATE SODIUM (COLACE) 100 MG CAPSULE    Take 100 mg by mouth as needed for mild constipation.  ? FLUTICASONE (FLONASE) 50 MCG/ACT NASAL SPRAY    Place 1 spray into both nostrils daily.  ? FUROSEMIDE (LASIX) 20 MG TABLET    Take 1 tablet (20 mg total) by mouth daily as needed for fluid or edema (3lb weight gain).  ? IRON, FERROUS SULFATE, 325 (65 FE) MG TABS    Take 325 mg by mouth in the morning and at bedtime.  ? LORATADINE (CLARITIN) 10 MG TABLET    Take 1 tablet (10 mg total) by mouth daily.  ? LOVASTATIN (MEVACOR) 40 MG TABLET    TAKE ONE TABLET BY MOUTH AT BEDTIME  ? PANTOPRAZOLE (PROTONIX) 40 MG TABLET    Take 1 tablet (40 mg total) by mouth 2 (two) times daily.  ? PROPYLENE GLYCOL (SYSTANE BALANCE) 0.6 % SOLN    Apply 1 drop to eye as needed (for dry eye).  ? RAMIPRIL (ALTACE) 10 MG CAPSULE    Take 2 capsules (20 mg total) by mouth daily.  ? TRAZODONE (DESYREL) 100 MG TABLET    Take 1 tablet (100 mg total) by mouth at bedtime.  ? VITAMIN B-12 (CYANOCOBALAMIN) 1000 MCG TABLET    Take 1,000 mcg by mouth daily.  ? WARFARIN (COUMADIN) 4 MG TABLET    Take 1 tablet (4 mg total) by mouth daily.  ?Modified Medications  ? No medications on file  ?Discontinued Medications  ? No medications on file  ? ? ?Physical Exam: ? ?Vitals:  ? 03/05/22 1051  ?BP: 120/80  ?Pulse: 65  ?Temp: (!) 96.9 ?F (36.1 ?C)  ?SpO2: 95%  ?Height: '5\' 9"'$  (1.753 m)  ? ?Body mass index is 26.43 kg/m?. ?Wt Readings from Last 3 Encounters:  ?02/26/22 179 lb (81.2 kg)  ?02/12/22 178 lb (80.7 kg)  ?01/25/22 175 lb (79.4 kg)  ? ? ?Physical Exam ?Constitutional:   ?   General: He is not in acute distress. ?   Appearance: He is well-developed. He is not diaphoretic.  ?HENT:   ?   Head: Normocephalic and atraumatic.  ?   Mouth/Throat:  ?   Pharynx: No oropharyngeal exudate.  ?Cardiovascular:  ?   Rate and Rhythm: Normal rate and regular rhythm.  ?   Heart sounds: Normal heart sounds.  ?Pulmonary:  ?   Effort: Pulmonary effort is normal.  ?  Breath sounds: Normal breath sounds.  ?Musculoskeletal:     ?   General: No tenderness.  ?   Cervical back: Normal range of motion and neck supple.  ?   Right lower leg: No edema.  ?   Left lower leg: No edema.  ?Skin: ?   General: Skin is warm and dry.  ?Neurological:  ?   Mental Status: He is alert and oriented to person, place, and time.  ? ? ?Labs reviewed: ?Basic Metabolic Panel: ?Recent Labs  ?  03/28/21 ?0905 04/18/21 ?1607 04/19/21 ?0438 04/20/21 ?8891 04/27/21 ?1030 10/07/21 ?1235 01/22/22 ?1539  ?NA 138   < > 136   < > 136 139 137  ?K 4.2   < > 4.2   < > 4.2 4.4 5.4*  ?CL 109   < > 110   < > 107 106 100  ?CO2 26   < > 21*   < > '22 21 23  '$ ?GLUCOSE 94   < > 95   < > 96 80 75  ?BUN 16   < > 28*   < > '16 18 18  '$ ?CREATININE 1.30*   < > 1.40*   < > 1.19* 1.33* 1.46*  ?CALCIUM 8.1*   < > 8.5*   < > 8.8 8.9 9.7  ?MG 1.7  --  1.8  --   --   --   --   ?PHOS 3.2  --  3.6  --   --   --   --   ? < > = values in this interval not displayed.  ? ?Liver Function Tests: ?Recent Labs  ?  03/27/21 ?1532 04/18/21 ?1607 04/19/21 ?0438 04/27/21 ?1030 10/07/21 ?1235  ?AST 17 15 12* 16 17  ?ALT '10 11 9 9 9  '$ ?ALKPHOS 78 77 67  --   --   ?BILITOT 0.5 0.5 0.5 0.3 0.4  ?PROT 6.7 5.5* 4.6* 5.6* 6.0*  ?ALBUMIN 3.3* 2.9* 2.5*  --   --   ? ?No results for input(s): LIPASE, AMYLASE in the last 8760 hours. ?No results for input(s): AMMONIA in the last 8760 hours. ?CBC: ?Recent Labs  ?  04/27/21 ?1030 05/19/21 ?1159 10/07/21 ?1235 01/22/22 ?1539  ?WBC 5.8 5.3 5.2 9.8  ?NEUTROABS [id number] 3,535 3,333  --   ?HGB 10.9* 11.7* 14.6 14.8  ?HCT 35.4* 38.8 43.8 44.1  ?MCV 82.1 85.7 89.4 90  ?PLT 341 285 235 265  ? ?Lipid Panel: ?Recent Labs  ?  03/09/21 ?6945  ?CHOL 185  ?HDL 67   ?Albin 91  ?TRIG 171*  ?CHOLHDL 2.8  ? ?TSH: ?No results for input(s): TSH in the last 8760 hours. ?A1C: ?No results found for: HGBA1C ? ? ?Assessment/Plan ?1. Long-term (current) use of anticoagulants, INR g

## 2022-03-05 ENCOUNTER — Encounter: Payer: Self-pay | Admitting: Nurse Practitioner

## 2022-03-05 ENCOUNTER — Ambulatory Visit (INDEPENDENT_AMBULATORY_CARE_PROVIDER_SITE_OTHER): Payer: Medicare Other | Admitting: Nurse Practitioner

## 2022-03-05 VITALS — BP 120/80 | HR 65 | Temp 96.9°F | Ht 69.0 in

## 2022-03-05 DIAGNOSIS — R791 Abnormal coagulation profile: Secondary | ICD-10-CM | POA: Diagnosis not present

## 2022-03-05 DIAGNOSIS — E785 Hyperlipidemia, unspecified: Secondary | ICD-10-CM

## 2022-03-05 DIAGNOSIS — Z7901 Long term (current) use of anticoagulants: Secondary | ICD-10-CM

## 2022-03-05 DIAGNOSIS — Z952 Presence of prosthetic heart valve: Secondary | ICD-10-CM | POA: Diagnosis not present

## 2022-03-05 LAB — POCT INR: INR: 2.9 (ref 2.0–3.0)

## 2022-03-05 MED ORDER — WARFARIN SODIUM 1 MG PO TABS: ORAL_TABLET | ORAL | 0 refills | Status: DC

## 2022-03-05 MED ORDER — WARFARIN SODIUM 3 MG PO TABS: ORAL_TABLET | ORAL | 0 refills | Status: DC

## 2022-03-05 NOTE — Patient Instructions (Addendum)
To continue coumadin 3.5 mg daily, to follow up in 2-3 weeks. Fasting blood work prior to visit.  ?

## 2022-03-08 ENCOUNTER — Ambulatory Visit (INDEPENDENT_AMBULATORY_CARE_PROVIDER_SITE_OTHER): Payer: Medicare Other | Admitting: Family

## 2022-03-08 ENCOUNTER — Encounter: Payer: Self-pay | Admitting: Family

## 2022-03-08 VITALS — BP 132/64 | HR 95 | Temp 97.8°F | Resp 18 | Ht 68.0 in

## 2022-03-08 DIAGNOSIS — R42 Dizziness and giddiness: Secondary | ICD-10-CM | POA: Diagnosis not present

## 2022-03-08 DIAGNOSIS — R102 Pelvic and perineal pain: Secondary | ICD-10-CM | POA: Diagnosis not present

## 2022-03-08 LAB — POCT URINALYSIS DIPSTICK
Bilirubin, UA: NEGATIVE
Glucose, UA: NEGATIVE
Ketones, UA: NEGATIVE
Leukocytes, UA: NEGATIVE
Nitrite, UA: NEGATIVE
Protein, UA: NEGATIVE
Spec Grav, UA: 1.015 (ref 1.010–1.025)
Urobilinogen, UA: NEGATIVE E.U./dL — AB
pH, UA: 6 (ref 5.0–8.0)

## 2022-03-08 NOTE — Progress Notes (Signed)
? ?Provider: [name] ? ?[name], NP ? ?Patient Care Team: ?[name], NP as PCP - General (Geriatric Medicine) ? ?Extended Emergency Contact Information ?Primary Emergency Contact: [name] ?Mobile Phone: [phone] ?Relation: Significant other ?Interpreter needed? No ? ?Code Status:  DNR ?Goals of care: Advanced Directive information ? ?  03/08/2022  ?  1:48 PM  ?Advanced Directives  ?Does Patient Have a Medical Advance Directive? Yes  ?Type of Advance Directive Out of facility DNR (pink MOST or yellow form);Healthcare Power of Attorney  ?Does patient want to make changes to medical advance directive? No - Patient declined  ?Copy of Garrett Terry in Chart? Yes - validated most recent copy scanned in chart (See row information)  ?Pre-existing out of facility DNR order (yellow form or pink MOST form) Yellow form placed in chart (order not valid for inpatient use)  ? ? ? ?Chief Complaint  ?Patient presents with  ? Acute Visit  ?  Patient has CC of dizziness and would like Hemoglobin checked patient fainted recently  ? ? ?HPI:  ?Pt is a 76 y.o. male seen today for an acute visit for evaluation of dizziness x 4 days.He is here with wife who provides additional HPI information.states his INR was adjusted 4 days ago on Friday.Had Granddaughter's graduation celebration on Saturday felt wobbly on Saturday evening.slept good but had crazy dreams.Had some disorientation. Usually normal for him to get up 2-3 times at night to use the bathroom.Also had some lightheadedness.wife was following to asking him something then found him leaned to one side with brief passing out then he was back to his normal self. He did not fall.He denies any dizziness or lightheadedness today.Also denies any fever,chills,cough,fatigue,body aches,runny nose,chest tightness,chest pain,palpitation or shortness of breath. ?No recent contact with any sick persons with COVID-19. ? ? ? ?Past Medical  History:  ?Diagnosis Date  ? Anxiety   ? Aortic atherosclerosis ([place]) 03/28/2021  ? Atypical mole 11/18/2021  ? Right Lower Back (moderate with halo nevus effect) (wider shave)  ? Closed compression fracture of body of L1 vertebra ([place]) 03/28/2021  ? Complication of anesthesia   ? PONV  ? Compression fracture of T12 vertebra ([place]) 03/28/2021  ? Depression   ? History of bladder cancer   ? History of pneumonia 1959  ? History of prostate cancer   ? History of scarlet fever 1956  ? History of stroke 2009  ? Hypertension   ? Long term current use of anticoagulant 03/22/2011  ? 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 Right Alar Crease  ? 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 Right Temple (in situ) (curet and 5FU)  ? ?Past Surgical History:  ?Procedure Laterality Date  ? 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 BIOPSY  03/28/2021  ? Procedure: BIOPSY;  Surgeon: Ronald Lobo, MD;  [facility];  Service: Endoscopy;;  ? [place]  ? Bladder Cancer Surgery   ? ESOPHAGOGASTRODUODENOSCOPY (EGD) WITH PROPOFOL N/A 03/28/2021  ? Procedure: ESOPHAGOGASTRODUODENOSCOPY (EGD) WITH PROPOFOL;  Surgeon: Ronald Lobo, MD;  [facility];  Service: Endoscopy;  Laterality: N/A;  ? KNEE SURGERY Right 1980  ? KNEE SURGERY Left 1982  ? OTHER SURGICAL HISTORY  1992  ? stomach muscles removed due to Coumadin caused bleeding.  ? SKIN LESION EXCISION    ? Some cancerous  ? SKIN SURGERY  01/30/2022  ? Basal Cell on side  of nose  ? TONSILLECTOMY  1965  ? ? ?Allergies  ?Allergen Reactions  ? Aspirin   ?  Other reaction(s): Other (See Comments) ?Can't take due to taking blood thinners  ? Morphine And Related Other (See Comments)  ?  Other reaction(s): Other (See Comments) ?Hallucinations ? ?  ? Nitroglycerin   ?  Severe headache  ? Promethazine Other (See Comments)  ?  Other reaction(s): Other (See Comments) ?Hallucinations ?  ? Promethazine Hcl Other (See Comments)   ? ? ?Outpatient Encounter Medications as of 03/08/2022  ?Medication Sig  ? acetaminophen (TYLENOL) 500 MG tablet Take 500 mg by mouth daily as needed for moderate pain.  ? albuterol (VENTOLIN HFA) 108 (90 Base) MCG/ACT inhaler Inhale 1 puff into the lungs every 6 (six) hours as needed for wheezing or shortness of breath.  ? carbamide peroxide (DEBROX) 6.5 % OTIC solution Place 5 drops into the left ear 2 (two) times daily.  ? Cholecalciferol (VITAMIN D-3 PO) Take 1 tablet by mouth 2 (two) times daily. Dose unknown  ? ciclopirox (PENLAC) 8 % solution Apply topically at bedtime. Apply over nail and surrounding skin. Apply daily over previous coat. After seven (7) days, may remove with alcohol and continue cycle.  ? docusate sodium (COLACE) 100 MG capsule Take 100 mg by mouth as needed for mild constipation.  ? fluticasone (FLONASE) 50 MCG/ACT nasal spray Place 1 spray into both nostrils daily.  ? furosemide (LASIX) 20 MG tablet Take 1 tablet (20 mg total) by mouth daily as needed for fluid or edema (3lb weight gain).  ? Iron, Ferrous Sulfate, 325 (65 Fe) MG TABS Take 325 mg by mouth in the morning and at bedtime.  ? loratadine (CLARITIN) 10 MG tablet Take 1 tablet (10 mg total) by mouth daily.  ? lovastatin (MEVACOR) 40 MG tablet TAKE ONE TABLET BY MOUTH AT BEDTIME  ? pantoprazole (PROTONIX) 40 MG tablet Take 1 tablet (40 mg total) by mouth 2 (two) times daily.  ? Propylene Glycol (SYSTANE BALANCE) 0.6 % SOLN Apply 1 drop to eye as needed (for dry eye).  ? ramipril (ALTACE) 10 MG capsule Take 2 capsules (20 mg total) by mouth daily.  ? traZODone (DESYREL) 100 MG tablet Take 1 tablet (100 mg total) by mouth at bedtime.  ? vitamin B-12 (CYANOCOBALAMIN) 1000 MCG tablet Take 1,000 mcg by mouth daily.  ? warfarin (COUMADIN) 1 MG tablet To take half tablet along with 3 mg tablet to = 3.5 mg daily  ? warfarin (COUMADIN) 3 MG tablet To take along with 0.5 mg tablet to take 3.5 mg daily  ? ?No facility-administered encounter  medications on file as of 03/08/2022.  ? ? ?Review of Systems  ?Constitutional:  Negative for appetite change, chills, fatigue, fever and unexpected weight change.  ?HENT:  Negative for congestion, dental problem, ear discharge, ear pain, facial swelling, hearing loss, nosebleeds, postnasal drip, rhinorrhea, sinus pressure, sinus pain, sneezing, sore throat, tinnitus and trouble swallowing.   ?Eyes:  Negative for pain, discharge, redness, itching and visual disturbance.  ?Respiratory:  Negative for cough, chest tightness, shortness of breath and wheezing.   ?Cardiovascular:  Negative for chest pain, palpitations and leg swelling.  ?Gastrointestinal:  Negative for abdominal distention, abdominal pain, blood in stool, constipation, diarrhea, nausea and vomiting.  ?Endocrine: Negative for cold intolerance, heat intolerance, polydipsia, polyphagia and polyuria.  ?Genitourinary:  Negative for difficulty urinating, dysuria, flank pain and urgency.  ?     Voids 2-3 times during the  night which is usual for him   ?Musculoskeletal:  Negative for arthralgias, back pain, gait problem, joint swelling, myalgias, neck pain and neck stiffness.  ?Skin:  Negative for color change, pallor, rash and wound.  ?Neurological:  Negative for dizziness, speech difficulty, weakness, light-headedness, numbness and headaches.  ?     Brief passing out as above  ?Hematological:  Does not bruise/bleed easily.  ?Psychiatric/Behavioral:  Negative for agitation, behavioral problems, confusion, hallucinations, self-injury, sleep disturbance and suicidal ideas. The patient is not nervous/anxious.   ? ?Immunization History  ?Administered Date(s) Administered  ? Fluad Quad(high Dose 65+) 07/01/2021  ? Influenza Inj Mdck Quad Pf 07/08/2016  ? Influenza Split 08/06/2011, 08/14/2012, 07/20/2013, 07/19/2014  ? Influenza, High Dose Seasonal PF 07/26/2017, 07/23/2019  ? Influenza, Seasonal, Injecte, Preservative Fre 07/28/2015  ? Influenza,inj,Quad PF,6+ Mos  08/21/2018, 08/11/2020  ? PFIZER(Purple Top)SARS-COV-2 Vaccination 11/01/2019, 11/20/2019, 09/24/2020  ? Pneumococcal Conjugate-13 07/19/2014  ? Pneumococcal Polysaccharide-23 07/28/2015  ? Tdap 03/20/2020  ? ?Per

## 2022-03-09 LAB — CBC WITH DIFFERENTIAL/PLATELET
Absolute Monocytes: 1091 cells/uL — ABNORMAL HIGH (ref 200–950)
Basophils Absolute: 43 cells/uL (ref 0–200)
Basophils Relative: 0.4 %
Eosinophils Absolute: 385 cells/uL (ref 15–500)
Eosinophils Relative: 3.6 %
HCT: 44.9 % (ref 38.5–50.0)
Hemoglobin: 14.9 g/dL (ref 13.2–17.1)
Lymphs Abs: 1573 cells/uL (ref 850–3900)
MCH: 29.9 pg (ref 27.0–33.0)
MCHC: 33.2 g/dL (ref 32.0–36.0)
MCV: 90 fL (ref 80.0–100.0)
MPV: 10.1 fL (ref 7.5–12.5)
Monocytes Relative: 10.2 %
Neutro Abs: 7608 cells/uL (ref 1500–7800)
Neutrophils Relative %: 71.1 %
Platelets: 291 10*3/uL (ref 140–400)
RBC: 4.99 10*6/uL (ref 4.20–5.80)
RDW: 13.1 % (ref 11.0–15.0)
Total Lymphocyte: 14.7 %
WBC: 10.7 10*3/uL (ref 3.8–10.8)

## 2022-03-09 LAB — BASIC METABOLIC PANEL WITH GFR
BUN: 19 mg/dL (ref 7–25)
CO2: 29 mmol/L (ref 20–32)
Calcium: 8.9 mg/dL (ref 8.6–10.3)
Chloride: 102 mmol/L (ref 98–110)
Creat: 1.25 mg/dL (ref 0.70–1.28)
Glucose, Bld: 84 mg/dL (ref 65–139)
Potassium: 4.5 mmol/L (ref 3.5–5.3)
Sodium: 138 mmol/L (ref 135–146)
eGFR: 60 mL/min/{1.73_m2} (ref >=60)

## 2022-03-09 LAB — URINE CULTURE
MICRO NUMBER:: 13365615
Result:: NO GROWTH
SPECIMEN QUALITY:: ADEQUATE

## 2022-03-10 ENCOUNTER — Other Ambulatory Visit: Payer: Self-pay | Admitting: Nurse Practitioner

## 2022-03-10 DIAGNOSIS — Z7901 Long term (current) use of anticoagulants: Secondary | ICD-10-CM

## 2022-03-14 ENCOUNTER — Other Ambulatory Visit: Payer: Self-pay | Admitting: Nurse Practitioner

## 2022-03-14 DIAGNOSIS — I10 Essential (primary) hypertension: Secondary | ICD-10-CM

## 2022-03-15 ENCOUNTER — Other Ambulatory Visit: Payer: Medicare Other

## 2022-03-15 DIAGNOSIS — Z7901 Long term (current) use of anticoagulants: Secondary | ICD-10-CM

## 2022-03-15 DIAGNOSIS — D509 Iron deficiency anemia, unspecified: Secondary | ICD-10-CM

## 2022-03-15 DIAGNOSIS — Z952 Presence of prosthetic heart valve: Secondary | ICD-10-CM

## 2022-03-15 DIAGNOSIS — R790 Abnormal level of blood mineral: Secondary | ICD-10-CM

## 2022-03-15 DIAGNOSIS — E785 Hyperlipidemia, unspecified: Secondary | ICD-10-CM

## 2022-03-15 DIAGNOSIS — Z1159 Encounter for screening for other viral diseases: Secondary | ICD-10-CM

## 2022-03-16 LAB — COMPLETE METABOLIC PANEL WITH GFR
AG Ratio: 1.5 (calc) (ref 1.0–2.5)
ALT: 15 U/L (ref 9–46)
AST: 14 U/L (ref 10–35)
Albumin: 3.3 g/dL — ABNORMAL LOW (ref 3.6–5.1)
Alkaline phosphatase (APISO): 78 U/L (ref 35–144)
BUN/Creatinine Ratio: 10 (calc) (ref 6–22)
BUN: 14 mg/dL (ref 7–25)
CO2: 29 mmol/L (ref 20–32)
Calcium: 9 mg/dL (ref 8.6–10.3)
Chloride: 104 mmol/L (ref 98–110)
Creat: 1.37 mg/dL — ABNORMAL HIGH (ref 0.70–1.28)
Globulin: 2.2 g/dL (calc) (ref 1.9–3.7)
Glucose, Bld: 131 mg/dL — ABNORMAL HIGH (ref 65–99)
Potassium: 4.7 mmol/L (ref 3.5–5.3)
Sodium: 138 mmol/L (ref 135–146)
Total Bilirubin: 0.4 mg/dL (ref 0.2–1.2)
Total Protein: 5.5 g/dL — ABNORMAL LOW (ref 6.1–8.1)
eGFR: 54 mL/min/{1.73_m2} — ABNORMAL LOW (ref >=60)

## 2022-03-16 LAB — LIPID PANEL
Cholesterol: 188 mg/dL (ref <200)
HDL: 49 mg/dL (ref >=40)
LDL Cholesterol (Calc): 107 mg/dL (calc) — ABNORMAL HIGH
Non-HDL Cholesterol (Calc): 139 mg/dL (calc) — ABNORMAL HIGH (ref <130)
Total CHOL/HDL Ratio: 3.8 (calc) (ref <5.0)
Triglycerides: 202 mg/dL — ABNORMAL HIGH (ref <150)

## 2022-03-16 LAB — PROTIME-INR
INR: 3 — ABNORMAL HIGH
Prothrombin Time: 29.4 s — ABNORMAL HIGH (ref 9.0–11.5)

## 2022-03-16 LAB — HEPATITIS C ANTIBODY
Hepatitis C Ab: NONREACTIVE
SIGNAL TO CUT-OFF: 0.07 (ref <1.00)

## 2022-03-23 NOTE — Progress Notes (Unsigned)
Careteam: Patient Care Team: [name], NP as PCP - General (Geriatric Medicine)  PLACE OF SERVICE:  [place]  Advanced Directive information    Allergies  Allergen Reactions   Aspirin     Other reaction(s): Other (See Comments) Can't take due to taking blood thinners   Morphine And Related Other (See Comments)    Other reaction(s): Other (See Comments) Hallucinations     Nitroglycerin     Severe headache   Promethazine Other (See Comments)    Other reaction(s): Other (See Comments) Hallucinations    Promethazine Hcl Other (See Comments)    No chief complaint on file.    HPI: Patient is a 76 y.o. male ***  Review of Systems:  ROS***  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[place]) 03/28/2021   Atypical mole 11/18/2021   Right Lower Back (moderate with halo nevus effect) (wider shave)   Closed compression fracture of body of L1 vertebra ([facility]) 01/65/5374   Complication of anesthesia    PONV   Compression fracture of T12 vertebra ([facility]) 03/28/2021   Depression    History of bladder cancer    History of pneumonia 1959   History of prostate cancer    History of scarlet fever 1956   History of stroke 2009   Hypertension    Long term current use of anticoagulant 03/22/2011   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   Right Alar Crease   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   Right Temple (in situ) (curet and 5FU)   Past Surgical History:  Procedure Laterality Date   AORTIC VALVE REPLACEMENT     mechanical   BIOPSY  03/28/2021   Procedure: BIOPSY;  Surgeon: Ronald Lobo, MD;  [facility];  Service: Endoscopy;;   BLADDER SURGERY  1983   Bladder Cancer Surgery    ESOPHAGOGASTRODUODENOSCOPY (EGD) WITH PROPOFOL N/A 03/28/2021   Procedure: ESOPHAGOGASTRODUODENOSCOPY (EGD) WITH PROPOFOL;  Surgeon: Ronald Lobo, MD;  [facility];  Service: Endoscopy;   Laterality: N/A;   KNEE SURGERY Right 1980   KNEE SURGERY Left 1982   OTHER SURGICAL HISTORY  1992   stomach muscles removed due to Coumadin caused bleeding.   SKIN LESION EXCISION     Some cancerous   SKIN SURGERY  01/30/2022   Basal Cell on side of nose   TONSILLECTOMY  1965   Social History:   reports that he has never smoked. His smokeless tobacco use includes snuff. He reports that he does not currently use alcohol. He reports that he does not use drugs.  Family History  Problem Relation Age of Onset   Heart disease Neg Hx     Medications: Patient's Medications  New Prescriptions   No medications on file  Previous Medications   ACETAMINOPHEN (TYLENOL) 500 MG TABLET    Take 500 mg by mouth daily as needed for moderate pain.   ALBUTEROL (VENTOLIN HFA) 108 (90 BASE) MCG/ACT INHALER    Inhale 1 puff into the lungs every 6 (six) hours as needed for wheezing or shortness of breath.   CARBAMIDE PEROXIDE (DEBROX) 6.5 % OTIC SOLUTION    Place 5 drops into the left ear 2 (two) times daily.   CHOLECALCIFEROL (VITAMIN D-3 PO)    Take 1 tablet by mouth 2 (two) times daily. Dose unknown   CICLOPIROX (PENLAC) 8 % SOLUTION    Apply topically at bedtime. Apply over  nail and surrounding skin. Apply daily over previous coat. After seven (7) days, may remove with alcohol and continue cycle.   DOCUSATE SODIUM (COLACE) 100 MG CAPSULE    Take 100 mg by mouth as needed for mild constipation.   FLUTICASONE (FLONASE) 50 MCG/ACT NASAL SPRAY    Place 1 spray into both nostrils daily.   FUROSEMIDE (LASIX) 20 MG TABLET    Take 1 tablet (20 mg total) by mouth daily as needed for fluid or edema (3lb weight gain).   IRON, FERROUS SULFATE, 325 (65 FE) MG TABS    Take 325 mg by mouth in the morning and at bedtime.   LORATADINE (CLARITIN) 10 MG TABLET    Take 1 tablet (10 mg total) by mouth daily.   LOVASTATIN (MEVACOR) 40 MG TABLET    TAKE ONE TABLET BY MOUTH AT BEDTIME   PANTOPRAZOLE (PROTONIX) 40 MG TABLET     Take 1 tablet (40 mg total) by mouth 2 (two) times daily.   PROPYLENE GLYCOL (SYSTANE BALANCE) 0.6 % SOLN    Apply 1 drop to eye as needed (for dry eye).   RAMIPRIL (ALTACE) 10 MG CAPSULE    TAKE ONE CAPSULE BY MOUTH TWICE A DAY   TRAZODONE (DESYREL) 100 MG TABLET    Take 1 tablet (100 mg total) by mouth at bedtime.   VITAMIN B-12 (CYANOCOBALAMIN) 1000 MCG TABLET    Take 1,000 mcg by mouth daily.   WARFARIN (COUMADIN) 1 MG TABLET    To take half tablet along with 3 mg tablet to = 3.5 mg daily   WARFARIN (COUMADIN) 3 MG TABLET    To take along with 0.5 mg tablet to take 3.5 mg daily  Modified Medications   No medications on file  Discontinued Medications   No medications on file    Physical Exam:  There were no vitals filed for this visit. There is no height or weight on file to calculate BMI. Wt Readings from Last 3 Encounters:  02/26/22 179 lb (81.2 kg)  02/12/22 178 lb (80.7 kg)  01/25/22 175 lb (79.4 kg)    Physical Exam***  Labs reviewed: Basic Metabolic Panel: Recent Labs    03/28/21 0905 04/18/21 1607 04/19/21 0438 04/20/21 0513 01/22/22 1539 03/08/22 1618 03/15/22 0830  NA 138   < > 136   < > 137 138 138  K 4.2   < > 4.2   < > 5.4* 4.5 4.7  CL 109   < > 110   < > 100 102 104  CO2 26   < > 21*   < > '23 29 29  '$ GLUCOSE 94   < > 95   < > 75 84 131*  BUN 16   < > 28*   < > '18 19 14  '$ CREATININE 1.30*   < > 1.40*   < > 1.46* 1.25 1.37*  CALCIUM 8.1*   < > 8.5*   < > 9.7 8.9 9.0  MG 1.7  --  1.8  --   --   --   --   PHOS 3.2  --  3.6  --   --   --   --    < > = values in this interval not displayed.   Liver Function Tests: Recent Labs    03/27/21 1532 04/18/21 1607 04/19/21 0438 04/27/21 1030 10/07/21 1235 03/15/22 0830  AST 17 15 12* '16 17 14  '$ ALT '10 11 9 9 9 15  '$ ALKPHOS 78  77 67  --   --   --   BILITOT 0.5 0.5 0.5 0.3 0.4 0.4  PROT 6.7 5.5* 4.6* 5.6* 6.0* 5.5*  ALBUMIN 3.3* 2.9* 2.5*  --   --   --    No results for input(s): LIPASE, AMYLASE in the last  8760 hours. No results for input(s): AMMONIA in the last 8760 hours. CBC: Recent Labs    05/19/21 1159 10/07/21 1235 01/22/22 1539 03/08/22 1618  WBC 5.3 5.2 9.8 10.7  NEUTROABS 3,535 3,333  --  7,608  HGB 11.7* 14.6 14.8 14.9  HCT 38.8 43.8 44.1 44.9  MCV 85.7 89.4 90 90.0  PLT 285 235 265 291   Lipid Panel: Recent Labs    03/15/22 0830  CHOL 188  HDL 49  LDLCALC 107*  TRIG 202*  CHOLHDL 3.8   TSH: No results for input(s): TSH in the last 8760 hours. A1C: No results found for: HGBA1C   Assessment/Plan 1. Essential hypertension ***  2. Gastroesophageal reflux disease without esophagitis ***  3. H/O aortic valve replacement ***  4. Generalized anxiety disorder ***  5. Alcohol dependence in remission (HCC) ***  6. Iron deficiency anemia due to chronic blood loss ***  7. Mixed hyperlipidemia ***  8. Primary insomnia ***    No follow-ups on file.: *** Endia Moncur K. [place], [facility] [phone]

## 2022-03-24 ENCOUNTER — Encounter: Payer: Self-pay | Admitting: Nurse Practitioner

## 2022-03-24 ENCOUNTER — Ambulatory Visit (INDEPENDENT_AMBULATORY_CARE_PROVIDER_SITE_OTHER): Payer: Medicare Other | Admitting: Nurse Practitioner

## 2022-03-24 VITALS — BP 120/80 | HR 89 | Temp 97.2°F | Ht 68.0 in | Wt 182.5 lb

## 2022-03-24 DIAGNOSIS — F411 Generalized anxiety disorder: Secondary | ICD-10-CM | POA: Diagnosis not present

## 2022-03-24 DIAGNOSIS — F1021 Alcohol dependence, in remission: Secondary | ICD-10-CM

## 2022-03-24 DIAGNOSIS — Z952 Presence of prosthetic heart valve: Secondary | ICD-10-CM | POA: Diagnosis not present

## 2022-03-24 DIAGNOSIS — E440 Moderate protein-calorie malnutrition: Secondary | ICD-10-CM

## 2022-03-24 DIAGNOSIS — E782 Mixed hyperlipidemia: Secondary | ICD-10-CM

## 2022-03-24 DIAGNOSIS — F5101 Primary insomnia: Secondary | ICD-10-CM

## 2022-03-24 DIAGNOSIS — I10 Essential (primary) hypertension: Secondary | ICD-10-CM

## 2022-03-24 DIAGNOSIS — K219 Gastro-esophageal reflux disease without esophagitis: Secondary | ICD-10-CM | POA: Diagnosis not present

## 2022-03-24 DIAGNOSIS — D500 Iron deficiency anemia secondary to blood loss (chronic): Secondary | ICD-10-CM

## 2022-03-24 NOTE — Patient Instructions (Addendum)
Stop Claritin and switch to zyrtec Use nasal wash daily at bedtime  Add nutritional supplement- 3 meals daily with 30 gm protein

## 2022-03-25 ENCOUNTER — Encounter: Payer: Self-pay | Admitting: *Deleted

## 2022-03-25 NOTE — Telephone Encounter (Signed)
-----   Message from Minus Breeding, MD sent at 03/25/2022 10:04 AM EDT ----- I would probably see him back in the office again to explore this event before ordering a monitor.  We will call him and see if he wants to come in .  Thanks.  ----- Message ----- From: [name], NP Sent: 03/24/2022   9:14 AM EDT To: Minus Breeding, MD  Good Morning Dr Percival Spanish,  I saw Garrett Terry today and his girlfriend was expressing concern over his syncopal episode. Original episode was 1 year ago associated with GI bleed but ~2 weeks ago had another episode were in "blacked out" for several seconds and then came back around. He was seen at our office without acute findings. Blood work unremarkable.  No chest pains or palpitations. It also has not been often so I was not sure if you thought a monitor would be beneficial.  Thank you for your help. Janett Billow, NP

## 2022-03-25 NOTE — Telephone Encounter (Signed)
Left message for patient to call to schedule follow up appointment.

## 2022-04-01 NOTE — Telephone Encounter (Signed)
Left message for pt to call.

## 2022-04-13 NOTE — Progress Notes (Signed)
Cardiology Office Note   Date:  04/14/2022   ID:  Garrett Terry, Garrett Terry, Garrett Terry, MRN [id number]  PCP:  Sharon Seller, NP  Cardiologist:   None Referring:  Sharon Seller, NP  Chief Complaint  Patient presents with   AVR      History of Present Illness: Garrett Terry is a 76 y.o. male who presents for follow up of AVR.    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  He has been on anticoagulation since that time.   He returns for follow-up and says he is doing well.  He is been alcohol free for about 15 months.  He has had an episode to be very brief loss of consciousness when talking to his friend recently.  This is about 4 weeks ago.  He was sitting there and he just stopped responding and she said might have actually lost consciousness for about 60 seconds.  He came back and was fine.  She said this was very similar to what happened in 2022 when he was hospitalized.  I did review these records again for this visit and at that time he was thought to have GI bleeding and was anemic from diverticulitis.  However, he had an episode probably like that before and he has not really recall and so this may have been about the third event.  In the intervening months he has been doing well.  Not been having any presyncope or syncope.  He does not have any orthostatic symptoms.  He does not feel palpitations.  Has had no chest pressure, neck or arm discomfort.  He has had no weight gain or edema.  He has been fatigued.   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   Right Alar Crease   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   Right Temple (in situ) (curet and 5FU)    Past Surgical History:  Procedure Laterality Date   AORTIC VALVE REPLACEMENT     mechanical   BIOPSY  03/28/2021   Procedure: BIOPSY;  Surgeon: Bernette Redbird, MD;  Location: [facility];  Service: Endoscopy;;   BLADDER SURGERY  1983   Bladder Cancer Surgery    ESOPHAGOGASTRODUODENOSCOPY (EGD) WITH PROPOFOL N/A 03/28/2021   Procedure: ESOPHAGOGASTRODUODENOSCOPY (EGD) WITH PROPOFOL;  Surgeon: Bernette Redbird, MD;  Location: [facility];  Service: Endoscopy;  Laterality: N/A;   KNEE SURGERY Right 1980   KNEE SURGERY Left 1982   OTHER SURGICAL HISTORY  1992   stomach muscles removed due to Coumadin caused bleeding.   SKIN LESION EXCISION     Some cancerous   SKIN SURGERY  01/30/2022   Basal Cell on side of nose   TONSILLECTOMY  1965  Current Outpatient Medications  Medication Sig Dispense Refill   acetaminophen (TYLENOL) 500 MG tablet Take 500 mg by mouth daily as needed for moderate pain.     albuterol (VENTOLIN HFA) 108 (90 Base) MCG/ACT inhaler Inhale 1 puff into the lungs every 6 (six) hours as needed for wheezing or shortness of breath. 18 g 1   Cholecalciferol (VITAMIN D-3 PO) Take 1 tablet by mouth 2 (two) times daily. Dose unknown     ciclopirox (PENLAC) 8 % solution Apply topically at bedtime. Apply over nail and surrounding skin. Apply daily over previous coat. After seven (7) days, may remove with alcohol and continue cycle. 6.6 mL 0   docusate sodium (COLACE) 100 MG capsule Take 100 mg by mouth as needed for mild constipation.     fluticasone (FLONASE) 50 MCG/ACT nasal spray Place 1 spray  into both nostrils daily. 16 g 6   furosemide (LASIX) 20 MG tablet Take 1 tablet (20 mg total) by mouth daily as needed for fluid or edema (3lb weight gain).     Iron, Ferrous Sulfate, 325 (65 Fe) MG TABS Take 325 mg by mouth in the morning and at bedtime. 180 tablet 3   loratadine (CLARITIN) 10 MG tablet Take 1 tablet (10 mg total) by mouth daily. 30 tablet 11   lovastatin (MEVACOR) 40 MG tablet TAKE ONE TABLET BY MOUTH AT BEDTIME 90 tablet 1   pantoprazole (PROTONIX) 40 MG tablet Take 1 tablet (40 mg total) by mouth 2 (two) times daily. 180 tablet 3   Propylene Glycol (SYSTANE BALANCE) 0.6 % SOLN Apply 1 drop to eye as needed (for dry eye).     ramipril (ALTACE) 10 MG capsule TAKE ONE CAPSULE BY MOUTH TWICE A DAY 180 capsule 2   traZODone (DESYREL) 100 MG tablet Take 1 tablet (100 mg total) by mouth at bedtime. 90 tablet 1   vitamin B-12 (CYANOCOBALAMIN) 1000 MCG tablet Take 1,000 mcg by mouth daily.     warfarin (COUMADIN) 1 MG tablet To take half tablet along with 3 mg tablet to = 3.5 mg daily 30 tablet 0   warfarin (COUMADIN) 3 MG tablet To take along with 0.5 mg tablet to take 3.5 mg daily 30 tablet 0   No current facility-administered medications for this visit.    Allergies:   Aspirin, Morphine and related, Nitroglycerin, Promethazine, and Promethazine hcl    Social History:  The patient  reports that he has never smoked. His smokeless tobacco use includes snuff. He reports that he does not currently use alcohol. He reports that he does not use drugs.   Family History: Noncontributory  ROS:  Please see the history of present illness.   Otherwise, review of systems are positive for reflux, sinus problems, some vomiting at night related he thinks to both reflux and sinuses.   All other systems are reviewed and negative.    PHYSICAL EXAM: VS:  BP 118/62   Pulse 79   Ht 5\' 9"  (1.753 m)   Wt 180 lb 3.2 oz (81.7 kg)   SpO2 99%   BMI 26.61 kg/m  , BMI Body mass index is 26.61  kg/m. GENERAL:  Well appearing NECK:  No jugular venous distention, waveform within normal limits, carotid upstroke brisk and symmetric, questionable bruits, no thyromegaly LUNGS:  Clear to auscultation bilaterally CHEST:  Well healed sternotomy scar. HEART:  PMI not displaced or sustained,S1 and mechanical S2 within normal limits, no S3, no S4, no clicks, no rubs, no murmurs ABD:  Flat, positive bowel sounds normal in frequency in pitch, no bruits, no rebound, no guarding, no midline pulsatile mass, no hepatomegaly, no splenomegaly EXT:  2 plus pulses throughout, no edema, no cyanosis no clubbing    EKG:  EKG is ordered today. The ekg ordered today demonstrates NSR.   Rate 79, left axis deviation, interventricular conduction delay, premature ventricular contractions, no change from previous   Recent Labs: 04/19/2021: Magnesium 1.8 03/08/2022: Hemoglobin 14.9; Platelets 291 03/15/2022: ALT 15; BUN 14; Creat 1.37; Potassium 4.7; Sodium 138    Lipid Panel    Component Value Date/Time   CHOL 188 03/15/2022 0830   TRIG 202 (H) 03/15/2022 0830   HDL 49 03/15/2022 0830   CHOLHDL 3.8 03/15/2022 0830   LDLCALC 107 (H) 03/15/2022 0830      Wt Readings from Last 3 Encounters:  04/14/22 180 lb 3.2 oz (81.7 kg)  03/24/22 182 lb 8 oz (82.8 kg)  02/26/22 179 lb (81.2 kg)      Other studies Reviewed: Additional studies/ records that were reviewed today include: Labs. Review of the above records demonstrates:  Please see elsewhere in the note.     ASSESSMENT AND PLAN:  AVR: The patient has had a stable aortic valve replacement.  He has his Coumadin followed by his primary provider.  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  He understands that I would like him to have his INR closer to 3 up to 3-1/2.   I am going to check an echocardiogram again this year given the event described above.     CKD: His last creatinine was 1.37  which has been unchanged.   SYNCOPE: He understands the rules about not driving for 6 months after an event.  I am going to have him wear a 4-week monitor.  He is also going to get a wearable.  I will be checking the echo as above.  BRUIT: There is a questionable soft carotid bruit I will check carotid Dopplers  Current medicines are reviewed at length with the patient today.  The patient does not have concerns regarding medicines.  The following changes have been made: None  Labs/ tests ordered today include:    Orders Placed This Encounter  Procedures   Cardiac event monitor   EKG 12-Lead   ECHOCARDIOGRAM COMPLETE   VAS US CAROTID     Disposition:   FU with me in a couple of months after the above testing   Signed, Rollene Rotunda, MD  04/14/2022 5:26 PM     [facility]

## 2022-04-14 ENCOUNTER — Encounter: Payer: Self-pay | Admitting: Cardiology

## 2022-04-14 ENCOUNTER — Ambulatory Visit (INDEPENDENT_AMBULATORY_CARE_PROVIDER_SITE_OTHER): Payer: Medicare Other | Admitting: Cardiology

## 2022-04-14 ENCOUNTER — Ambulatory Visit (INDEPENDENT_AMBULATORY_CARE_PROVIDER_SITE_OTHER): Payer: Medicare Other | Admitting: Physician Assistant

## 2022-04-14 ENCOUNTER — Encounter: Payer: Self-pay | Admitting: Physician Assistant

## 2022-04-14 VITALS — BP 118/62 | HR 79 | Ht 69.0 in | Wt 180.2 lb

## 2022-04-14 DIAGNOSIS — Z952 Presence of prosthetic heart valve: Secondary | ICD-10-CM

## 2022-04-14 DIAGNOSIS — R55 Syncope and collapse: Secondary | ICD-10-CM | POA: Diagnosis not present

## 2022-04-14 DIAGNOSIS — Z85828 Personal history of other malignant neoplasm of skin: Secondary | ICD-10-CM

## 2022-04-14 DIAGNOSIS — L820 Inflamed seborrheic keratosis: Secondary | ICD-10-CM | POA: Diagnosis not present

## 2022-04-14 DIAGNOSIS — Z86018 Personal history of other benign neoplasm: Secondary | ICD-10-CM | POA: Diagnosis not present

## 2022-04-14 DIAGNOSIS — D485 Neoplasm of uncertain behavior of skin: Secondary | ICD-10-CM

## 2022-04-14 DIAGNOSIS — N182 Chronic kidney disease, stage 2 (mild): Secondary | ICD-10-CM

## 2022-04-14 DIAGNOSIS — L570 Actinic keratosis: Secondary | ICD-10-CM

## 2022-04-14 NOTE — Patient Instructions (Signed)
  Testing/Procedures:  Your physician has requested that you have a carotid duplex. This test is an ultrasound of the carotid arteries in your neck. It looks at blood flow through these arteries that supply the brain with blood. Allow one hour for this exam. There are no restrictions or special instructions. [place] physician has requested that you have an echocardiogram. Echocardiography is a painless test that uses sound waves to create images of your heart. It provides your doctor with information about the size and shape of your heart and how well your heart's chambers and valves are working. This procedure takes approximately one hour. There are no restrictions for this procedure. [place] has recommended that you wear an event monitor. Event monitors are medical devices that record the heart's electrical activity. Doctors most often [place] these monitors to diagnose arrhythmias. Arrhythmias are problems with the speed or rhythm of the heartbeat. The monitor is a small, portable device. You can wear one while you do your normal daily activities. This is usually used to diagnose what is causing palpitations/syncope (passing out).    Follow-Up: At [facility], you and your health needs are our priority.  As part of our continuing mission to provide you with exceptional heart care, we have created designated Provider Care Teams.  These Care Teams include your primary Cardiologist (physician) and Advanced Practice Providers (APPs -  Physician Assistants and Nurse Practitioners) who all work together to provide you with the care you need, when you need it.  We recommend signing up for the patient portal called "MyChart".  Sign up information is provided on this After Visit Summary.  MyChart is used to connect with patients for Virtual Visits (Telemedicine).  Patients are able to view lab/test results, encounter notes, upcoming appointments, etc.  Non-urgent  messages can be sent to your provider as well.   To learn more about what you can do with MyChart, go to [email or web address].    Your next appointment:   2 month(s)  The format for your next appointment:   In Person  Provider:   Minus Breeding MD    Important Information About Sugar

## 2022-04-14 NOTE — Progress Notes (Signed)
   Follow-Up Visit   Subjective  Garrett Terry is a 76 y.o. male who presents for the following: Follow-up (Previous history of bcc, scc and atypia with mohs treatment. Left scalp today has some pink scale).   The following portions of the chart were reviewed this encounter and updated as appropriate:  Tobacco  Allergies  Meds  Problems  Med Hx  Surg Hx  Fam Hx      Objective  Well appearing patient in no apparent distress; mood and affect are within normal limits.  All skin waist up examined.  Left Upper Mid Back Bichromic dark nested macule.        Scalp (4) Erythematous patches with gritty scale.   Assessment & Plan  Neoplasm of uncertain behavior of skin Left Upper Mid Back  Skin / nail biopsy Type of biopsy: tangential   Informed consent: discussed and consent obtained   Timeout: patient name, date of birth, surgical site, and procedure verified   Anesthesia: the lesion was anesthetized in a standard fashion   Anesthetic:  1% lidocaine w/ epinephrine 1-100,000 local infiltration Instrument used: flexible razor blade   Hemostasis achieved with: aluminum chloride   Outcome: patient tolerated procedure well   Post-procedure details: sterile dressing applied and wound care instructions given   Dressing type: bandage and petrolatum    Specimen 1 - Surgical pathology Differential Diagnosis: R/O Atypia  Check Margins: Yes  AK (actinic keratosis) (4) Scalp  Destruction of lesion - Scalp Complexity: simple   Destruction method: cryotherapy   Informed consent: discussed and consent obtained   Timeout:  patient name, date of birth, surgical site, and procedure verified Lesion destroyed using liquid nitrogen: Yes   Cryotherapy cycles:  1 Outcome: patient tolerated procedure well with no complications   Post-procedure details: wound care instructions given      I, Meda Dudzinski, PA-C, have reviewed all documentation's for this visit.  The  documentation on 04/14/22 for the exam, diagnosis, procedures and orders are all accurate and complete.

## 2022-04-14 NOTE — Patient Instructions (Signed)

## 2022-04-15 ENCOUNTER — Other Ambulatory Visit: Payer: Self-pay | Admitting: Nurse Practitioner

## 2022-04-15 ENCOUNTER — Encounter: Payer: Self-pay | Admitting: Radiology

## 2022-04-15 NOTE — Progress Notes (Signed)
Enrolled patient for a 30 day [employer] Event Monitor to be mailed to patients home  

## 2022-04-16 NOTE — Telephone Encounter (Signed)
This encounter was created in error - please disregard.

## 2022-04-21 ENCOUNTER — Other Ambulatory Visit: Payer: Self-pay | Admitting: Cardiology

## 2022-04-21 DIAGNOSIS — J309 Allergic rhinitis, unspecified: Secondary | ICD-10-CM

## 2022-04-21 NOTE — Telephone Encounter (Signed)
*  STAT* If patient is at the pharmacy, call can be transferred to refill team.   1. Which medications need to be refilled? (please list name of each medication and dose if known) need a mew prescription for Flonase  with new directions 1 time in the morning and 1 time in the evening  2. Which pharmacy/location (including street and city if local pharmacy) is medication to be sent to?[facility], [place]  3. Do they need a 30 day or 90 day supply?

## 2022-04-23 ENCOUNTER — Encounter: Payer: Self-pay | Admitting: Nurse Practitioner

## 2022-04-23 ENCOUNTER — Ambulatory Visit (INDEPENDENT_AMBULATORY_CARE_PROVIDER_SITE_OTHER): Payer: Medicare Other | Admitting: Nurse Practitioner

## 2022-04-23 VITALS — BP 138/76 | HR 78 | Temp 97.1°F | Ht 69.0 in | Wt 182.4 lb

## 2022-04-23 DIAGNOSIS — R55 Syncope and collapse: Secondary | ICD-10-CM

## 2022-04-23 DIAGNOSIS — Z7901 Long term (current) use of anticoagulants: Secondary | ICD-10-CM | POA: Diagnosis not present

## 2022-04-23 DIAGNOSIS — Z952 Presence of prosthetic heart valve: Secondary | ICD-10-CM

## 2022-04-23 DIAGNOSIS — D6869 Other thrombophilia: Secondary | ICD-10-CM | POA: Diagnosis not present

## 2022-04-23 DIAGNOSIS — D509 Iron deficiency anemia, unspecified: Secondary | ICD-10-CM

## 2022-04-23 DIAGNOSIS — J309 Allergic rhinitis, unspecified: Secondary | ICD-10-CM

## 2022-04-23 DIAGNOSIS — K5903 Drug induced constipation: Secondary | ICD-10-CM

## 2022-04-23 LAB — POCT INR: INR: 3.5 — AB (ref 2.0–3.0)

## 2022-04-23 MED ORDER — FLUTICASONE PROPIONATE 50 MCG/ACT NA SUSP: 1.0000 | Freq: Two times a day (BID) | NASAL | 6 refills | Status: DC

## 2022-04-23 MED ORDER — IRON (FERROUS SULFATE) 325 (65 FE) MG PO TABS: 325.0000 mg | ORAL_TABLET | Freq: Every day | ORAL | 3 refills | Status: DC

## 2022-04-26 ENCOUNTER — Ambulatory Visit: Payer: Medicare Other | Admitting: Nurse Practitioner

## 2022-04-28 ENCOUNTER — Ambulatory Visit (HOSPITAL_COMMUNITY)
Admission: RE | Admit: 2022-04-28 | Discharge: 2022-04-28 | Disposition: A | Discharge status: Home / Self Care | Payer: Medicare Other | Source: Ambulatory Visit | Attending: Cardiology | Admitting: Cardiology

## 2022-04-28 ENCOUNTER — Ambulatory Visit (HOSPITAL_BASED_OUTPATIENT_CLINIC_OR_DEPARTMENT_OTHER): Payer: Medicare Other

## 2022-04-28 DIAGNOSIS — R55 Syncope and collapse: Secondary | ICD-10-CM | POA: Diagnosis present

## 2022-04-28 LAB — ECHOCARDIOGRAM COMPLETE
AR max vel: 1.6 cm2
AV Area VTI: 1.64 cm2
AV Area mean vel: 1.61 cm2
AV Mean grad: 10 mmHg
AV Peak grad: 17 mmHg
Ao pk vel: 2.06 m/s
Area-P 1/2: 7.74 cm2
S' Lateral: 3.6 cm

## 2022-04-30 ENCOUNTER — Other Ambulatory Visit: Payer: Self-pay | Admitting: *Deleted

## 2022-04-30 DIAGNOSIS — I6529 Occlusion and stenosis of unspecified carotid artery: Secondary | ICD-10-CM

## 2022-05-07 ENCOUNTER — Ambulatory Visit (INDEPENDENT_AMBULATORY_CARE_PROVIDER_SITE_OTHER): Payer: Medicare Other | Admitting: Nurse Practitioner

## 2022-05-07 ENCOUNTER — Encounter: Payer: Self-pay | Admitting: *Deleted

## 2022-05-07 ENCOUNTER — Encounter: Payer: Self-pay | Admitting: Nurse Practitioner

## 2022-05-07 VITALS — BP 126/70 | HR 85 | Temp 97.3°F | Ht 69.0 in | Wt 180.4 lb

## 2022-05-07 DIAGNOSIS — I6529 Occlusion and stenosis of unspecified carotid artery: Secondary | ICD-10-CM

## 2022-05-07 DIAGNOSIS — D6869 Other thrombophilia: Secondary | ICD-10-CM

## 2022-05-07 DIAGNOSIS — Z952 Presence of prosthetic heart valve: Secondary | ICD-10-CM

## 2022-05-07 DIAGNOSIS — Z7901 Long term (current) use of anticoagulants: Secondary | ICD-10-CM | POA: Diagnosis not present

## 2022-05-07 LAB — POCT INR: INR: 3.2 — AB (ref 2.0–3.0)

## 2022-05-07 NOTE — Patient Instructions (Addendum)
To get COVID booster at pharmacy   To get Shingles vaccine at local pharmacy   Continue coumadin 3.5 mg daily

## 2022-05-07 NOTE — Progress Notes (Signed)
Careteam: Patient Care Team: [name], NP as PCP - General (Geriatric Medicine)  PLACE OF SERVICE:  [place]  Advanced Directive information    Allergies  Allergen Reactions   Aspirin     Other reaction(s): Other (See Comments) Can't take due to taking blood thinners   Morphine And Related Other (See Comments)    Other reaction(s): Other (See Comments) Hallucinations     Nitroglycerin     Severe headache   Promethazine Other (See Comments)    Other reaction(s): Other (See Comments) Hallucinations    Promethazine Hcl Other (See Comments)    Chief Complaint  Patient presents with   Medical Management of Chronic Issues    Patient presents today for a 2 week INR recheck.     HPI: Patient is a 76 y.o. male for INR check.  Has made dietary modifications for a healthier lifestyle.  INR today 3.2, 3.5 last check 2 weeks ago- at goal 3-3.5 . No changes in medications.   Constipation has improved, he decreased iron to daily  Review of Systems:  Review of Systems  Constitutional:  Negative for chills and fever.  HENT:  Negative for nosebleeds.   Cardiovascular:  Negative for chest pain and palpitations.  Gastrointestinal:  Negative for blood in stool.  Genitourinary:  Negative for hematuria.  Endo/Heme/Allergies:  Does not bruise/bleed easily.    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[place]) 03/28/2021   Atypical mole 11/18/2021   Right Lower Back (moderate with halo nevus effect) (wider shave)   Closed compression fracture of body of L1 vertebra (HCC) 81/44/8185   Complication of anesthesia    PONV   Compression fracture of T12 vertebra (HCC) 03/28/2021   Depression    History of bladder cancer    History of pneumonia 1959   History of prostate cancer    History of scarlet fever 1956   History of stroke 2009   Hypertension    Long term current use of anticoagulant 03/22/2011   Nodular basal cell carcinoma  (BCC) 11/18/2021   Right Alar Crease   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   Right Temple (in situ) (curet and 5FU)   Past Surgical History:  Procedure Laterality Date   AORTIC VALVE REPLACEMENT     mechanical   BIOPSY  03/28/2021   Procedure: BIOPSY;  Surgeon: Ronald Lobo, MD;  Location: [facility];  Service: Endoscopy;;   BLADDER SURGERY  1983   Bladder Cancer Surgery    ESOPHAGOGASTRODUODENOSCOPY (EGD) WITH PROPOFOL N/A 03/28/2021   Procedure: ESOPHAGOGASTRODUODENOSCOPY (EGD) WITH PROPOFOL;  Surgeon: Ronald Lobo, MD;  Location: [facility];  Service: Endoscopy;  Laterality: N/A;   KNEE SURGERY Right 1980   KNEE SURGERY Left 1982   OTHER SURGICAL HISTORY  1992   stomach muscles removed due to Coumadin caused bleeding.   SKIN LESION EXCISION     Some cancerous   SKIN SURGERY  01/30/2022   Basal Cell on side of nose   TONSILLECTOMY  1965   Social History:   reports that he has never smoked. His smokeless tobacco use includes snuff. He reports that he does not currently use alcohol. He reports that he does not use drugs.  Family History  Problem Relation Age of Onset   Heart disease Neg Hx     Medications: Patient's Medications  New Prescriptions   No medications on file  Previous Medications  ACETAMINOPHEN (TYLENOL) 500 MG TABLET    Take 500 mg by mouth daily as needed for moderate pain.   ALBUTEROL (VENTOLIN HFA) 108 (90 BASE) MCG/ACT INHALER    Inhale 1 puff into the lungs every 6 (six) hours as needed for wheezing or shortness of breath.   CHOLECALCIFEROL (VITAMIN D-3 PO)    Take 1 tablet by mouth 2 (two) times daily. Dose unknown   CICLOPIROX (PENLAC) 8 % SOLUTION    Apply topically at bedtime. Apply over nail and surrounding skin. Apply daily over previous coat. After seven (7) days, may remove with alcohol and continue cycle.   DOCUSATE SODIUM (COLACE) 100 MG CAPSULE    Take 100 mg by mouth as needed for mild constipation.    FLUTICASONE (FLONASE) 50 MCG/ACT NASAL SPRAY    Place 1 spray into both nostrils 2 (two) times daily.   FUROSEMIDE (LASIX) 20 MG TABLET    Take 1 tablet (20 mg total) by mouth daily as needed for fluid or edema (3lb weight gain).   IRON, FERROUS SULFATE, 325 (65 FE) MG TABS    Take 325 mg by mouth daily.   LORATADINE (CLARITIN) 10 MG TABLET    Take 1 tablet (10 mg total) by mouth daily.   LOVASTATIN (MEVACOR) 40 MG TABLET    TAKE ONE TABLET BY MOUTH AT BEDTIME   PANTOPRAZOLE (PROTONIX) 40 MG TABLET    Take 1 tablet (40 mg total) by mouth 2 (two) times daily.   PROPYLENE GLYCOL (SYSTANE BALANCE) 0.6 % SOLN    Apply 1 drop to eye as needed (for dry eye).   RAMIPRIL (ALTACE) 10 MG CAPSULE    TAKE ONE CAPSULE BY MOUTH TWICE A DAY   TRAZODONE (DESYREL) 100 MG TABLET    Take 1 tablet (100 mg total) by mouth at bedtime.   VITAMIN B-12 (CYANOCOBALAMIN) 1000 MCG TABLET    Take 1,000 mcg by mouth daily.   WARFARIN (COUMADIN) 3 MG TABLET    To take along with 0.5 mg tablet to take 3.5 mg daily  Modified Medications   No medications on file  Discontinued Medications   WARFARIN (COUMADIN) 1 MG TABLET    To take half tablet along with 3 mg tablet to = 3.5 mg daily    Physical Exam:  Vitals:   05/07/22 1102  BP: 126/70  Pulse: 85  Temp: (!) 97.3 F (36.3 C)  SpO2: 97%  Weight: 180 lb 6.4 oz (81.8 kg)  Height: '5\' 9"'$  (1.753 m)   Body mass index is 26.64 kg/m. Wt Readings from Last 3 Encounters:  05/07/22 180 lb 6.4 oz (81.8 kg)  04/23/22 182 lb 6.4 oz (82.7 kg)  04/14/22 180 lb 3.2 oz (81.7 kg)    Physical Exam Constitutional:      General: He is not in acute distress.    Appearance: He is well-developed. He is not diaphoretic.  Cardiovascular:     Rate and Rhythm: Normal rate and regular rhythm.     Heart sounds: Murmur heard.  Pulmonary:     Effort: Pulmonary effort is normal.     Breath sounds: Normal breath sounds.  Musculoskeletal:     Cervical back: Normal range of motion and  neck supple.     Right lower leg: No edema.     Left lower leg: No edema.  Skin:    General: Skin is warm and dry.  Neurological:     Mental Status: He is alert and oriented to person, place,  and time.     Labs reviewed: Basic Metabolic Panel: Recent Labs    01/22/22 1539 03/08/22 1618 03/15/22 0830  NA 137 138 138  K 5.4* 4.5 4.7  CL 100 102 104  CO2 '23 29 29  '$ GLUCOSE 75 84 131*  BUN '18 19 14  '$ CREATININE 1.46* 1.25 1.37*  CALCIUM 9.7 8.9 9.0   Liver Function Tests: Recent Labs    10/07/21 1235 03/15/22 0830  AST 17 14  ALT 9 15  BILITOT 0.4 0.4  PROT 6.0* 5.5*   No results for input(s): "LIPASE", "AMYLASE" in the last 8760 hours. No results for input(s): "AMMONIA" in the last 8760 hours. CBC: Recent Labs    05/19/21 1159 10/07/21 1235 01/22/22 1539 03/08/22 1618  WBC 5.3 5.2 9.8 10.7  NEUTROABS 3,535 3,333  --  7,608  HGB 11.7* 14.6 14.8 14.9  HCT 38.8 43.8 44.1 44.9  MCV 85.7 89.4 90 90.0  PLT 285 235 265 291   Lipid Panel: Recent Labs    03/15/22 0830  CHOL 188  HDL 49  LDLCALC 107*  TRIG 202*  CHOLHDL 3.8   TSH: No results for input(s): "TSH" in the last 8760 hours. A1C: No results found for: "HGBA1C"   Assessment/Plan 1. Long-term (current) use of anticoagulants, INR goal 3-3.5 - POCT INR 3.2 on coumadin 3.2, will continue current regimen.   2. 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 H/O aortic valve replacement -on coumadin for anticoagulation. No abnormal bruising or bleeding.  Continues on coumadin 3.5 mg daily   Return in about 4 weeks (around 06/04/2022). [facility] [employer]. [place], [facility] [phone]

## 2022-05-13 ENCOUNTER — Other Ambulatory Visit: Payer: Self-pay | Admitting: *Deleted

## 2022-05-13 DIAGNOSIS — Z952 Presence of prosthetic heart valve: Secondary | ICD-10-CM

## 2022-05-13 DIAGNOSIS — Z7901 Long term (current) use of anticoagulants: Secondary | ICD-10-CM

## 2022-05-13 MED ORDER — WARFARIN SODIUM 3 MG PO TABS: ORAL_TABLET | ORAL | 0 refills | Status: DC

## 2022-05-13 NOTE — Telephone Encounter (Signed)
Pharmacy requested refill

## 2022-05-26 ENCOUNTER — Other Ambulatory Visit: Payer: Self-pay | Admitting: Nurse Practitioner

## 2022-05-26 DIAGNOSIS — F5101 Primary insomnia: Secondary | ICD-10-CM

## 2022-06-07 ENCOUNTER — Encounter: Payer: Self-pay | Admitting: Nurse Practitioner

## 2022-06-07 ENCOUNTER — Ambulatory Visit (INDEPENDENT_AMBULATORY_CARE_PROVIDER_SITE_OTHER): Payer: Medicare Other | Admitting: Nurse Practitioner

## 2022-06-07 VITALS — BP 126/78 | HR 92 | Temp 97.9°F | Ht 69.0 in | Wt 181.0 lb

## 2022-06-07 DIAGNOSIS — Z952 Presence of prosthetic heart valve: Secondary | ICD-10-CM

## 2022-06-07 DIAGNOSIS — D6869 Other thrombophilia: Secondary | ICD-10-CM

## 2022-06-07 DIAGNOSIS — F5101 Primary insomnia: Secondary | ICD-10-CM | POA: Diagnosis not present

## 2022-06-07 DIAGNOSIS — Z7901 Long term (current) use of anticoagulants: Secondary | ICD-10-CM | POA: Diagnosis not present

## 2022-06-07 DIAGNOSIS — I6529 Occlusion and stenosis of unspecified carotid artery: Secondary | ICD-10-CM | POA: Diagnosis not present

## 2022-06-07 LAB — POCT INR: INR: 2.9 (ref 2.0–3.0)

## 2022-06-07 MED ORDER — TRAZODONE HCL 50 MG PO TABS: 50.0000 mg | ORAL_TABLET | Freq: Every day | ORAL | 1 refills | Status: DC

## 2022-06-07 NOTE — Patient Instructions (Signed)
Increase coumadin to 4 mg once weekly and coumadin 3.5 mg on all other days.    Follow up INR in 2 weeks

## 2022-06-07 NOTE — Progress Notes (Signed)
Careteam: Patient Care Team: Garrett Chandler, NP as PCP - General (Geriatric Medicine)  PLACE OF SERVICE:  [place]  Advanced Directive information    Allergies  Allergen Reactions   Aspirin     Other reaction(s): Other (See Comments) Can't take due to taking blood thinners   Morphine And Related Other (See Comments)    Other reaction(s): Other (See Comments) Hallucinations     Nitroglycerin     Severe headache   Promethazine Other (See Comments)    Other reaction(s): Other (See Comments) Hallucinations    Promethazine Hcl Other (See Comments)    Chief Complaint  Patient presents with   Anticoagulation    4 week PT/INR check. Discuss trazodone, patient is only taking 50 mg at bedtime and it seems effective. No belleding, no missed doses of blood thinner, and no major diet changes,      HPI: Patient is a 76 y.o. male who presents today for INR check. Has had no missed doses of medications or medication changes. Denies bleeding, bruising, dark stools, blood in urination.  INR today 2.9 goal is 3-3.5.  Currently taking coumadin 3.5 mg daily.   Review of Systems:  Review of Systems  Cardiovascular:  Negative for chest pain, palpitations and leg swelling.  Gastrointestinal:  Negative for abdominal pain, blood in stool and melena.  Genitourinary:  Negative for hematuria.  Endo/Heme/Allergies:  Does not bruise/bleed easily.    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   Right Alar Crease   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   Right Temple (in situ) (curet and 5FU)   Past Surgical History:  Procedure Laterality Date   AORTIC VALVE REPLACEMENT     mechanical   BIOPSY  03/28/2021   Procedure: BIOPSY;  Surgeon: Ronald Lobo, MD;  Location: [facility];  Service: Endoscopy;;   BLADDER SURGERY  1983   Bladder Cancer Surgery    ESOPHAGOGASTRODUODENOSCOPY (EGD) WITH PROPOFOL N/A 03/28/2021   Procedure: ESOPHAGOGASTRODUODENOSCOPY (EGD) WITH PROPOFOL;  Surgeon: Ronald Lobo, MD;  Location: [facility];  Service: Endoscopy;  Laterality: N/A;   KNEE SURGERY Right 1980   KNEE SURGERY Left 1982   OTHER SURGICAL HISTORY  1992   stomach muscles removed due to Coumadin caused bleeding.   SKIN LESION EXCISION     Some cancerous   SKIN SURGERY  01/30/2022   Basal Cell on side of nose   TONSILLECTOMY  1965   Social History:   reports that he has never smoked. His smokeless tobacco use includes snuff. He reports that he does not currently use alcohol. He reports that he does not use drugs.  Family History  Problem Relation Age of Onset   Heart disease Neg Hx     Medications: Patient's Medications  New Prescriptions  No medications on file  Previous Medications   ACETAMINOPHEN (TYLENOL) 500 MG TABLET    Take 500 mg by mouth daily as needed for moderate pain.   ALBUTEROL (VENTOLIN HFA) 108 (90 BASE) MCG/ACT INHALER    Inhale 1 puff into the lungs every 6 (six) hours as needed for wheezing or shortness of breath.   CHOLECALCIFEROL (VITAMIN D-3 PO)    Take 1 tablet by mouth 2 (two) times daily. Dose unknown   CICLOPIROX (PENLAC) 8 % SOLUTION    Apply topically at bedtime. Apply over nail and surrounding skin. Apply daily over previous coat. After seven (7) days, may remove with alcohol and continue cycle.   DOCUSATE SODIUM (COLACE)  100 MG CAPSULE    Take 100 mg by mouth as needed for mild constipation.   FLUTICASONE (FLONASE) 50 MCG/ACT NASAL SPRAY    Place 1 spray into both nostrils 2 (two) times daily.   FUROSEMIDE (LASIX) 20 MG TABLET    Take 1 tablet (20 mg total) by mouth daily as needed for fluid or edema (3lb weight gain).   IRON, FERROUS SULFATE, 325 (65 FE) MG TABS    Take 325 mg by mouth daily.   LORATADINE (CLARITIN) 10 MG TABLET    Take 1 tablet (10 mg total) by mouth daily.   LOVASTATIN (MEVACOR) 40 MG TABLET    TAKE ONE TABLET BY MOUTH AT BEDTIME   PANTOPRAZOLE (PROTONIX) 40 MG TABLET    Take 1 tablet (40 mg total) by mouth 2 (two) times daily.   PROPYLENE GLYCOL (SYSTANE BALANCE) 0.6 % SOLN    Apply 1 drop to eye as needed (for dry eye).   RAMIPRIL (ALTACE) 10 MG CAPSULE    TAKE ONE CAPSULE BY MOUTH TWICE A DAY   TRAZODONE (DESYREL) 100 MG TABLET    TAKE ONE TABLET BY MOUTH AT BEDTIME   VITAMIN B-12 (CYANOCOBALAMIN) 1000 MCG TABLET    Take 1,000 mcg by mouth daily.   WARFARIN (COUMADIN) 3 MG TABLET    To take along with 0.5 mg tablet to take 3.5 mg daily  Modified Medications   No medications on file  Discontinued Medications   No medications on file    Physical Exam:  Vitals:   06/07/22 1031  BP: 126/78  Pulse: 92  Temp: 97.9 F (36.6 C)  TempSrc: Temporal  SpO2: 96%  Weight: 82.1 kg  Height: '5\' 9"'$  (1.753 m)   Body mass index is 26.73 kg/m. Wt Readings from Last 3 Encounters:  06/07/22 82.1 kg  05/07/22 81.8 kg  04/23/22 82.7 kg    Physical Exam Cardiovascular:     Rate and Rhythm: Normal rate and regular rhythm.     Heart sounds: Murmur heard.  Pulmonary:     Effort: Pulmonary effort is normal.     Breath sounds: Normal breath sounds.  Abdominal:     General: Bowel sounds are normal.     Palpations: Abdomen is soft.     Tenderness: There is no abdominal tenderness.     Labs reviewed: Basic Metabolic Panel: Recent Labs    01/22/22 1539 03/08/22 1618 03/15/22 0830  NA  137 138 138  K 5.4* 4.5 4.7  CL 100 102 104  CO2 '23 29 29  '$ GLUCOSE 75 84 131*  BUN '18 19 14  '$ CREATININE 1.46* 1.25 1.37*  CALCIUM 9.7 8.9 9.0   Liver Function Tests: Recent Labs    10/07/21 1235 03/15/22 0830  AST 17 14  ALT 9 15  BILITOT 0.4 0.4  PROT 6.0* 5.5*   No results for input(s): "LIPASE", "AMYLASE" in the last 8760 hours. No results for input(s): "AMMONIA" in the last 8760 hours. CBC: Recent Labs    10/07/21 1235 01/22/22 1539 03/08/22 1618  WBC 5.2 9.8 10.7  NEUTROABS 3,333  --  7,608  HGB 14.6 14.8 14.9  HCT 43.8 44.1 44.9  MCV 89.4 90 90.0  PLT 235 265 291   Lipid Panel: Recent Labs    03/15/22 0830  CHOL 188  HDL 49  LDLCALC 107*  TRIG 202*  CHOLHDL 3.8   TSH: No results for input(s): "TSH" in the last 8760 hours. A1C: No results found for: "HGBA1C"   Assessment/Plan 1. Long-term (current) use of anticoagulants, INR goal 3.0- 3.5 -  POCT INR 2.9 on coumadin 3.5 mg daily,  - 4 mg on Mondays and 3.5 mg all other days, to follow up in 2 weeks. - POC INR  2. 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 H/o aortic valve replacement - on coumadin for anticoagulation.   4. Primary insomnia - endorses adequate sleep and doing well on trazodone 50 mg daily  - traZODone (DESYREL) 50 MG tablet; Take 1 tablet (50 mg total) by mouth at bedtime.  Dispense: 90 tablet; Refill: 1    Return in about 2 weeks (around 06/21/2022) for inr.  Student- Waunita Schooner, RN I personally was present during the history, physical exam and medical decision-making activities of this service and have verified that the service and findings are accurately documented in the student's note  [place] Nick K. [place], [facility] [phone]

## 2022-06-09 ENCOUNTER — Other Ambulatory Visit: Payer: Self-pay | Admitting: Nurse Practitioner

## 2022-06-09 DIAGNOSIS — Z952 Presence of prosthetic heart valve: Secondary | ICD-10-CM

## 2022-06-09 DIAGNOSIS — Z7901 Long term (current) use of anticoagulants: Secondary | ICD-10-CM

## 2022-06-21 ENCOUNTER — Ambulatory Visit (INDEPENDENT_AMBULATORY_CARE_PROVIDER_SITE_OTHER): Payer: Medicare Other | Admitting: Nurse Practitioner

## 2022-06-21 ENCOUNTER — Encounter: Payer: Self-pay | Admitting: Nurse Practitioner

## 2022-06-21 VITALS — BP 124/78 | HR 90 | Temp 97.1°F | Ht 69.0 in | Wt 182.0 lb

## 2022-06-21 DIAGNOSIS — F5101 Primary insomnia: Secondary | ICD-10-CM

## 2022-06-21 DIAGNOSIS — I6529 Occlusion and stenosis of unspecified carotid artery: Secondary | ICD-10-CM

## 2022-06-21 DIAGNOSIS — Z952 Presence of prosthetic heart valve: Secondary | ICD-10-CM

## 2022-06-21 DIAGNOSIS — Z7901 Long term (current) use of anticoagulants: Secondary | ICD-10-CM | POA: Diagnosis not present

## 2022-06-21 LAB — POCT INR: INR: 3.6 — AB (ref 2.0–3.0)

## 2022-06-21 MED ORDER — TRAZODONE HCL 50 MG PO TABS: 25.0000 mg | ORAL_TABLET | Freq: Every day | ORAL | 1 refills | Status: DC

## 2022-06-21 NOTE — Patient Instructions (Addendum)
Add 1 small serving of "leafy green" weekly Continue 4 mg once weekly on Wednesday, 3.5 mg coumadin all other days.  Follow up INR next Monday   Okay to decrease trazodone to 25 mg daily

## 2022-06-21 NOTE — Progress Notes (Signed)
Careteam: Patient Care Team: Garrett Chandler, NP as PCP - General (Geriatric Medicine)  PLACE OF SERVICE:   [place]  Advanced Directive information    Allergies  Allergen Reactions   Aspirin     Other reaction(s): Other (See Comments) Can't take due to taking blood thinners   Morphine And Related Other (See Comments)    Other reaction(s): Other (See Comments) Hallucinations     Nitroglycerin     Severe headache   Promethazine Other (See Comments)    Other reaction(s): Other (See Comments) Hallucinations    Promethazine Hcl Other (See Comments)    Chief Complaint  Patient presents with   Anticoagulation    2 week PT/INR check. No missed doses, no diet changes, and no unusual bleeding. Current dose of coumadin: 3.5 mg daily and 4 mg on 2 Mondays per last instructions      HPI: Patient is a 76 y.o. male   Presenting for INR check. Was subtherapeutic at last visit at 2.9 (goal 3-3.5)and warfarin was increased to 4.0 mg on Mondays and 3.5 daily for the remainder of the week. INR goal is 3.0-3.5. INR is 3.6 today. No missed doses. No dietary changes. No increase in bruising or bleeding.  Requesting to decrease trazadone to 25 mg because he is sleeping too much, well into the morning.   States allergies have been bothering him. Takes zyrtec and eye drops.   Review of Systems:  Review of Systems  Constitutional:  Negative for fever, malaise/fatigue and weight loss.  Respiratory:  Negative for shortness of breath.   Cardiovascular:  Negative for chest pain and leg swelling.  Gastrointestinal:  Negative for blood in stool and melena.  Genitourinary:  Negative for hematuria.  Endo/Heme/Allergies:  Positive for environmental allergies. Does not bruise/bleed easily.    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   Right Alar Crease   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   Right Temple (in situ) (curet and 5FU)   Past Surgical History:  Procedure Laterality Date   AORTIC VALVE REPLACEMENT     mechanical   BIOPSY  03/28/2021   Procedure: BIOPSY;  Surgeon: Ronald Lobo, MD;  [facility];  Service: Endoscopy;;   BLADDER SURGERY  1983   Bladder Cancer Surgery    ESOPHAGOGASTRODUODENOSCOPY (EGD) WITH PROPOFOL N/A 03/28/2021   Procedure: ESOPHAGOGASTRODUODENOSCOPY (EGD) WITH PROPOFOL;  Surgeon: Ronald Lobo, MD;  [facility];  Service: Endoscopy;  Laterality: N/A;   KNEE SURGERY Right 1980   KNEE SURGERY Left 1982   OTHER SURGICAL HISTORY  1992   stomach muscles removed due to Coumadin caused bleeding.   SKIN LESION EXCISION     Some cancerous   SKIN SURGERY  01/30/2022   Basal Cell on side of nose  TONSILLECTOMY  1965   Social History:   reports that he has never smoked. His smokeless tobacco use includes snuff. He reports that he does not currently use alcohol. He reports that he does not use drugs.  Family History  Problem Relation Age of Onset   Heart disease Neg Hx     Medications: Patient's Medications  New Prescriptions   No medications on file  Previous Medications   ACETAMINOPHEN (TYLENOL) 500 MG TABLET    Take 500 mg by mouth daily as needed for moderate pain.   ALBUTEROL (VENTOLIN HFA) 108 (90 BASE) MCG/ACT INHALER    Inhale 1 puff into the lungs every 6 (six) hours as needed for wheezing or  shortness of breath.   CHOLECALCIFEROL (VITAMIN D-3 PO)    Take 1 tablet by mouth 2 (two) times daily. Dose unknown   CICLOPIROX (PENLAC) 8 % SOLUTION    Apply topically at bedtime. Apply over nail and surrounding skin. Apply daily over previous coat. After seven (7) days, may remove with alcohol and continue cycle.   DOCUSATE SODIUM (COLACE) 100 MG CAPSULE    Take 100 mg by mouth as needed for mild constipation.   FLUTICASONE (FLONASE) 50 MCG/ACT NASAL SPRAY    Place 1 spray into both nostrils 2 (two) times daily.   FUROSEMIDE (LASIX) 20 MG TABLET    Take 1 tablet (20 mg total) by mouth daily as needed for fluid or edema (3lb weight gain).   IRON, FERROUS SULFATE, 325 (65 FE) MG TABS    Take 325 mg by mouth daily.   LORATADINE (CLARITIN) 10 MG TABLET    Take 1 tablet (10 mg total) by mouth daily.   LOVASTATIN (MEVACOR) 40 MG TABLET    TAKE ONE TABLET BY MOUTH AT BEDTIME   PANTOPRAZOLE (PROTONIX) 40 MG TABLET    Take 1 tablet (40 mg total) by mouth 2 (two) times daily.   PROPYLENE GLYCOL (SYSTANE BALANCE) 0.6 % SOLN    Apply 1 drop to eye as needed (for dry eye).   RAMIPRIL (ALTACE) 10 MG CAPSULE    TAKE ONE CAPSULE BY MOUTH TWICE A DAY   VITAMIN B-12 (CYANOCOBALAMIN) 1000 MCG TABLET    Take 1,000 mcg by mouth daily.   WARFARIN (COUMADIN) 3 MG TABLET    TAKE 1 TABLET BY MOUTH DAILY ALONG WITH 0.5 MG TABLET FOR TOTAL DOSE OF 3.5 MG DAILY  Modified Medications   Modified Medication Previous Medication   TRAZODONE (DESYREL) 50 MG TABLET traZODone (DESYREL) 50 MG tablet      Take 0.5 tablets (25 mg total) by mouth at bedtime.    Take 1 tablet (50 mg total) by mouth at bedtime.  Discontinued Medications   No medications on file    Physical Exam:  Vitals:   06/21/22 1010  BP: 124/78  Pulse: 90  Temp: (!) 97.1 F (36.2 C)  TempSrc: Temporal  SpO2: 97%  Weight: 182 lb (82.6 kg)  Height: '5\' 9"'$  (1.753 m)   Body mass index is 26.88 kg/m. Wt Readings from Last 3 Encounters:  06/21/22 182 lb  (82.6 kg)  06/07/22 181 lb (82.1 kg)  05/07/22 180 lb 6.4 oz (81.8 kg)    Physical Exam Constitutional:      General: He is not in acute distress.    Appearance: He is not toxic-appearing.  Cardiovascular:     Rate and Rhythm: Normal rate and regular rhythm.     Pulses: Normal pulses.     Heart  sounds: Murmur heard.  Musculoskeletal:     Right lower leg: No edema.     Left lower leg: No edema.  Neurological:     Mental Status: He is alert and oriented to person, place, and time. Mental status is at baseline.  Psychiatric:        Mood and Affect: Mood normal.        Behavior: Behavior normal.     Labs reviewed: Basic Metabolic Panel: Recent Labs    01/22/22 1539 03/08/22 1618 03/15/22 0830  NA 137 138 138  K 5.4* 4.5 4.7  CL 100 102 104  CO2 '23 29 29  '$ GLUCOSE 75 84 131*  BUN '18 19 14  '$ CREATININE 1.46* 1.25 1.37*  CALCIUM 9.7 8.9 9.0   Liver Function Tests: Recent Labs    10/07/21 1235 03/15/22 0830  AST 17 14  ALT 9 15  BILITOT 0.4 0.4  PROT 6.0* 5.5*   No results for input(s): "LIPASE", "AMYLASE" in the last 8760 hours. No results for input(s): "AMMONIA" in the last 8760 hours. CBC: Recent Labs    10/07/21 1235 01/22/22 1539 03/08/22 1618  WBC 5.2 9.8 10.7  NEUTROABS 3,333  --  7,608  HGB 14.6 14.8 14.9  HCT 43.8 44.1 44.9  MCV 89.4 90 90.0  PLT 235 265 291   Lipid Panel: Recent Labs    03/15/22 0830  CHOL 188  HDL 49  LDLCALC 107*  TRIG 202*  CHOLHDL 3.8   TSH: No results for input(s): "TSH" in the last 8760 hours. A1C: No results found for: "HGBA1C"   Assessment/Plan 1. Long-term (current) use of anticoagulants, INR goal 2.5-3.5 - 3.6 today, will have him continue same dose, continue on coumadin 3.5 mg daily, 4 mg on Wednesday and to add small serving of leafy greens this week - re-check next week - POC INR  2. H/O aortic valve replacement - on coumadin goal 3-3.5 - POC INR  3. Primary insomnia - decrease trazadone to 25 mg  daily - traZODone (DESYREL) 50 MG tablet; Take 0.5 tablets (25 mg total) by mouth at bedtime.  Dispense: 90 tablet; Refill: 1   Follow up in 1 week.   Student- Waunita Schooner, RN -I personally was present during the history, physical exam and medical decision-making activities of this service and have verified that the service and findings are accurately documented in the student's note  Janella Rogala K. [place], [facility] [phone]

## 2022-06-28 ENCOUNTER — Encounter: Payer: Self-pay | Admitting: Nurse Practitioner

## 2022-06-28 ENCOUNTER — Ambulatory Visit (INDEPENDENT_AMBULATORY_CARE_PROVIDER_SITE_OTHER): Payer: Medicare Other | Admitting: Nurse Practitioner

## 2022-06-28 ENCOUNTER — Ambulatory Visit: Payer: Medicare Other | Admitting: Nurse Practitioner

## 2022-06-28 VITALS — BP 132/82 | HR 71 | Temp 97.1°F | Resp 20 | Ht 69.0 in | Wt 181.2 lb

## 2022-06-28 DIAGNOSIS — Z7901 Long term (current) use of anticoagulants: Secondary | ICD-10-CM

## 2022-06-28 DIAGNOSIS — I6529 Occlusion and stenosis of unspecified carotid artery: Secondary | ICD-10-CM

## 2022-06-28 DIAGNOSIS — Z952 Presence of prosthetic heart valve: Secondary | ICD-10-CM | POA: Diagnosis not present

## 2022-06-28 LAB — POCT INR: INR: 3.7 — AB (ref 2.0–3.0)

## 2022-06-28 NOTE — Progress Notes (Signed)
Careteam: Patient Care Team: [name], NP as PCP - General (Geriatric Medicine)  PLACE OF SERVICE:  [place]  Advanced Directive information    Allergies  Allergen Reactions   Aspirin     Other reaction(s): Other (See Comments) Can't take due to taking blood thinners   Morphine And Related Other (See Comments)    Other reaction(s): Other (See Comments) Hallucinations     Nitroglycerin     Severe headache   Promethazine Other (See Comments)    Other reaction(s): Other (See Comments) Hallucinations    Promethazine Hcl Other (See Comments)    Chief Complaint  Patient presents with   Acute Visit    Patient presents today for a INR check     HPI: Patient is a 76 y.o. male for INR check, he has been taking coumdin 3.5 mg daily except for 4 mg on Mondays (weekly)   INR elevated a 3.7 today. (Up from 3.6 last week)  Goal is 3.0-3.5. He did eat more greens as instructed at last visit. Takes all medications including warfarin as prescribed. He went down to 25 mg of trazodone after last visit but otherwise no medication change.  -no abnormal bruising or bleeding.   Review of Systems:  Review of Systems  Constitutional:  Negative for fever, malaise/fatigue and weight loss.  Respiratory:  Negative for hemoptysis and shortness of breath.   Cardiovascular:  Negative for chest pain and leg swelling.  Gastrointestinal:  Negative for blood in stool and melena.  Genitourinary:  Negative for hematuria.  Psychiatric/Behavioral:  Negative for depression. The patient is not nervous/anxious.     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   Right Alar Crease   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   Right Temple (in situ) (curet and 5FU)   Past Surgical History:  Procedure Laterality Date   AORTIC VALVE REPLACEMENT     mechanical   BIOPSY  03/28/2021   Procedure: BIOPSY;  Surgeon: Ronald Lobo, MD;  [facility];  Service: Endoscopy;;   BLADDER SURGERY  1983   Bladder Cancer Surgery    ESOPHAGOGASTRODUODENOSCOPY (EGD) WITH PROPOFOL N/A 03/28/2021   Procedure: ESOPHAGOGASTRODUODENOSCOPY (EGD) WITH PROPOFOL;  Surgeon: Ronald Lobo, MD;  [facility];  Service: Endoscopy;  Laterality: N/A;   KNEE SURGERY Right 1980   KNEE SURGERY Left 1982   OTHER SURGICAL HISTORY  1992   stomach muscles removed due to Coumadin caused bleeding.   SKIN LESION EXCISION     Some cancerous   SKIN SURGERY  01/30/2022   Basal Cell on side of nose   TONSILLECTOMY  1965   Social History:   reports that he has never smoked. His smokeless tobacco use includes snuff. He reports that he does not currently use alcohol. He  reports that he does not use drugs.  Family History  Problem Relation Age of Onset   Heart disease Neg Hx     Medications: Patient's Medications  New Prescriptions   No medications on file  Previous Medications   ACETAMINOPHEN (TYLENOL) 500 MG TABLET    Take 500 mg by mouth daily as needed for moderate pain.   ALBUTEROL (VENTOLIN HFA) 108 (90 BASE) MCG/ACT INHALER    Inhale 1 puff into the lungs every 6 (six) hours as needed for wheezing or shortness of breath.   CHOLECALCIFEROL (VITAMIN D-3 PO)    Take 1 tablet by mouth 2 (two) times daily. Dose unknown   CICLOPIROX (PENLAC) 8 % SOLUTION    Apply topically at bedtime.  Apply over nail and surrounding skin. Apply daily over previous coat. After seven (7) days, may remove with alcohol and continue cycle.   DOCUSATE SODIUM (COLACE) 100 MG CAPSULE    Take 100 mg by mouth as needed for mild constipation.   FLUTICASONE (FLONASE) 50 MCG/ACT NASAL SPRAY    Place 1 spray into both nostrils 2 (two) times daily.   FUROSEMIDE (LASIX) 20 MG TABLET    Take 1 tablet (20 mg total) by mouth daily as needed for fluid or edema (3lb weight gain).   IRON, FERROUS SULFATE, 325 (65 FE) MG TABS    Take 325 mg by mouth daily.   LORATADINE (CLARITIN) 10 MG TABLET    Take 1 tablet (10 mg total) by mouth daily.   LOVASTATIN (MEVACOR) 40 MG TABLET    TAKE ONE TABLET BY MOUTH AT BEDTIME   PANTOPRAZOLE (PROTONIX) 40 MG TABLET    Take 1 tablet (40 mg total) by mouth 2 (two) times daily.   PROPYLENE GLYCOL (SYSTANE BALANCE) 0.6 % SOLN    Apply 1 drop to eye as needed (for dry eye).   RAMIPRIL (ALTACE) 10 MG CAPSULE    TAKE ONE CAPSULE BY MOUTH TWICE A DAY   TRAZODONE (DESYREL) 50 MG TABLET    Take 0.5 tablets (25 mg total) by mouth at bedtime.   VITAMIN B-12 (CYANOCOBALAMIN) 1000 MCG TABLET    Take 1,000 mcg by mouth daily.   WARFARIN (COUMADIN) 3 MG TABLET    TAKE 1 TABLET BY MOUTH DAILY ALONG WITH 0.5 MG TABLET FOR TOTAL DOSE OF 3.5 MG DAILY  Modified Medications   No medications on file  Discontinued Medications   No medications on file    Physical Exam:  Vitals:   06/28/22 1433  BP: 132/82  Pulse: 71  Resp: 20  Temp: (!) 97.1 F (36.2 C)  SpO2: 98%  Weight: 82.2 kg  Height: '5\' 9"'$  (1.753 m)   Body mass index is 26.76 kg/m. Wt Readings from Last 3 Encounters:  06/28/22 82.2 kg  06/21/22 82.6 kg  06/07/22 82.1 kg    Physical Exam Constitutional:      General: He is not in acute distress. Cardiovascular:     Rate and Rhythm: Normal rate.     Heart sounds: Murmur heard.  Pulmonary:     Effort: Pulmonary effort is normal. No respiratory distress.     Breath sounds:  Normal breath sounds. No wheezing.  Neurological:     Mental Status: He is alert and oriented to person, place, and time. Mental status is at baseline.  Psychiatric:        Mood and Affect: Mood normal.        Behavior: Behavior normal.     Labs  reviewed: Basic Metabolic Panel: Recent Labs    01/22/22 1539 03/08/22 1618 03/15/22 0830  NA 137 138 138  K 5.4* 4.5 4.7  CL 100 102 104  CO2 '23 29 29  '$ GLUCOSE 75 84 131*  BUN '18 19 14  '$ CREATININE 1.46* 1.25 1.37*  CALCIUM 9.7 8.9 9.0   Liver Function Tests: Recent Labs    10/07/21 1235 03/15/22 0830  AST 17 14  ALT 9 15  BILITOT 0.4 0.4  PROT 6.0* 5.5*   No results for input(s): "LIPASE", "AMYLASE" in the last 8760 hours. No results for input(s): "AMMONIA" in the last 8760 hours. CBC: Recent Labs    10/07/21 1235 01/22/22 1539 03/08/22 1618  WBC 5.2 9.8 10.7  NEUTROABS 3,333  --  7,608  HGB 14.6 14.8 14.9  HCT 43.8 44.1 44.9  MCV 89.4 90 90.0  PLT 235 265 291   Lipid Panel: Recent Labs    03/15/22 0830  CHOL 188  HDL 49  LDLCALC 107*  TRIG 202*  CHOLHDL 3.8   TSH: No results for input(s): "TSH" in the last 8760 hours. A1C: No results found for: "HGBA1C"   Assessment/Plan 1. Long-term (current) use of anticoagulants, INR goal 3-3.5 - INR 3.7 today, will reduce coumadin to 3.5 mg daily  and have him follow up in 2 weeks.  - POCT INR  2. H/O aortic valve replacement On coumadin, goal 3-3.5   Return in about 2 weeks (around 07/12/2022) for INR check .  Student- Waunita Schooner, RN -I personally was present during the history, physical exam and medical decision-making activities of this service and have verified that the service and findings are accurately documented in the student's note  Cyndel Griffey K. [place], [facility] [phone]

## 2022-06-28 NOTE — Patient Instructions (Signed)
Go back to coumadin 3.5 mg of coumadin daily We will recheck again in 2 weeks.

## 2022-06-30 ENCOUNTER — Ambulatory Visit: Payer: Medicare Other | Admitting: Family

## 2022-07-09 ENCOUNTER — Encounter: Payer: Self-pay | Admitting: Nurse Practitioner

## 2022-07-09 IMAGING — US US ABDOMEN COMPLETE
1 series · 15 of 25 positions shown · non-contrast
Comparison: 03/27/2021

CLINICAL DATA: Anemia, gastrointestinal bleeding

EXAM:
ABDOMEN ULTRASOUND COMPLETE

[Series 1: us abdomen complete mc & wl · 15 of 99 slices shown]
[im 1/99]
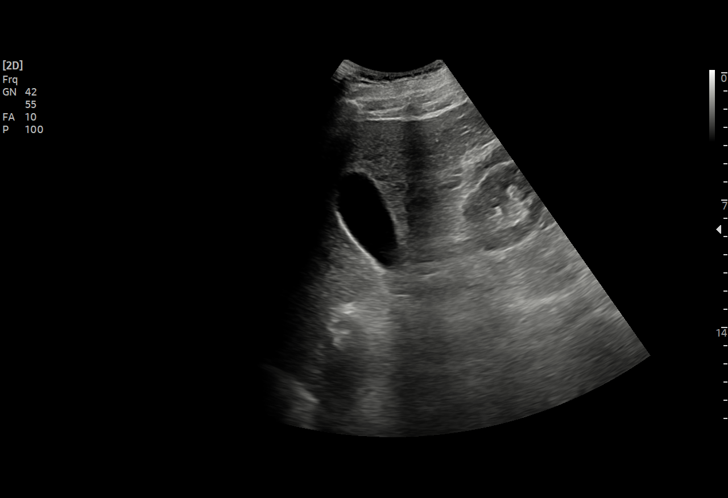
[im 9/99]
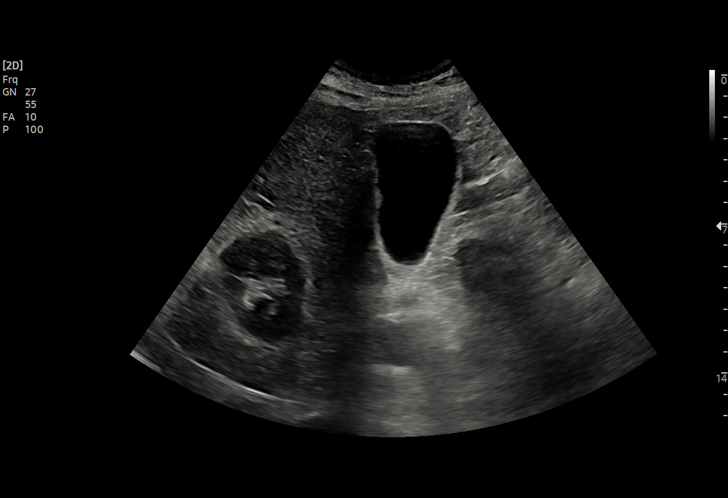
[im 17/99]
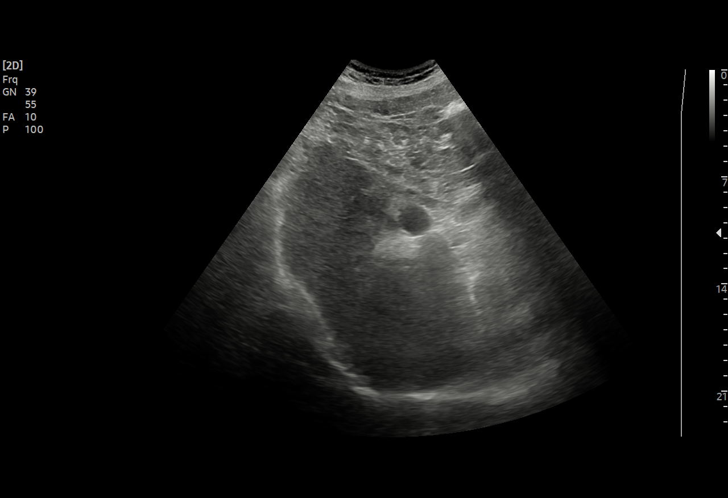
[im 21/99]
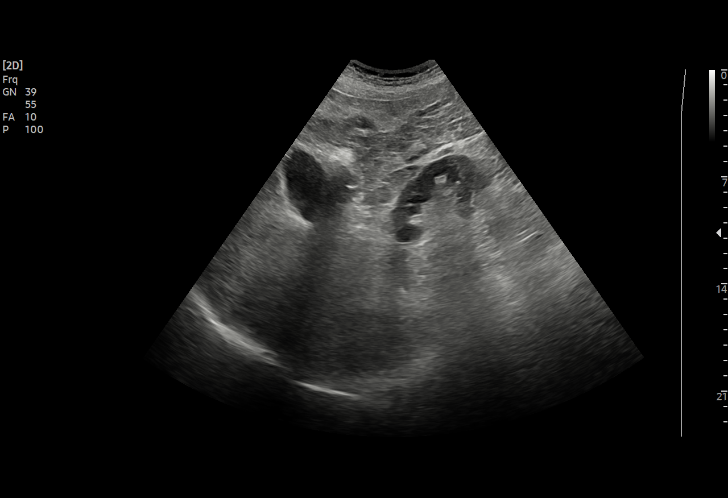
[im 29/99]
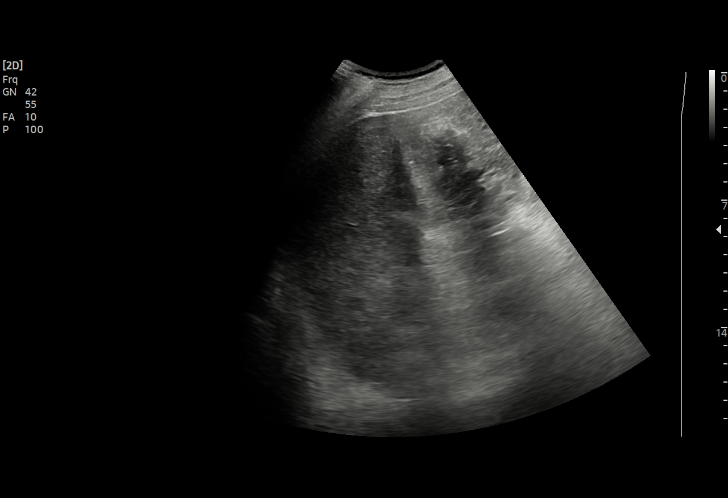
[im 37/99]
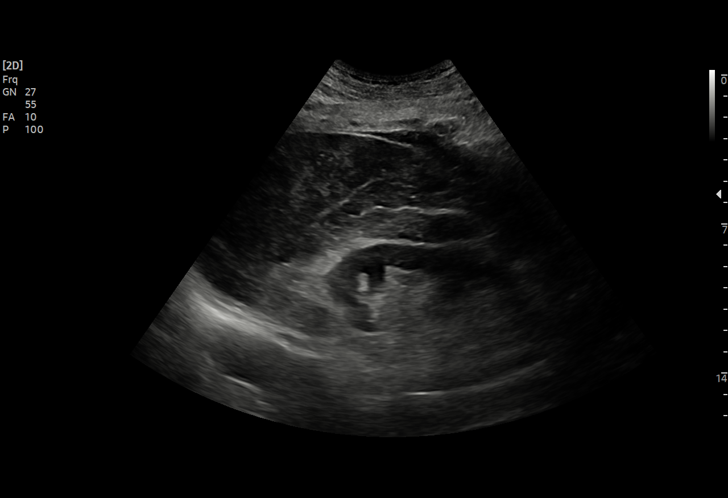
[im 41/99]
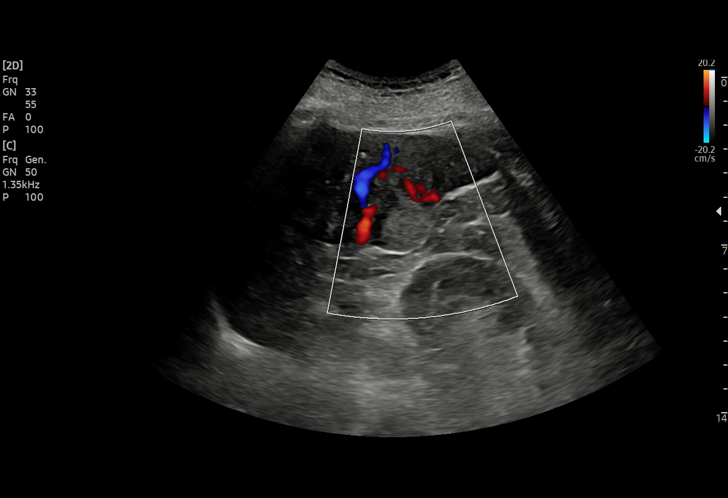
[im 50/99]
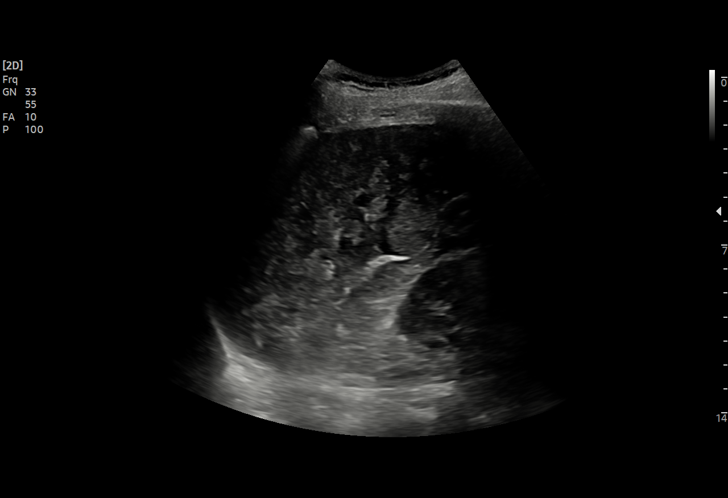
[im 58/99]
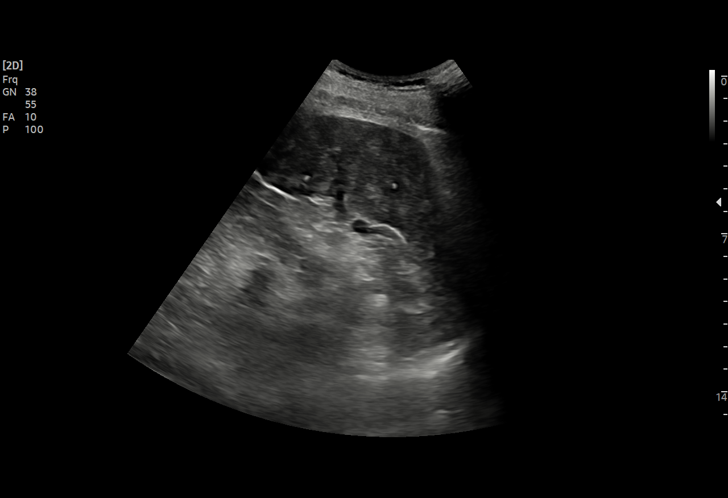
[im 62/99]
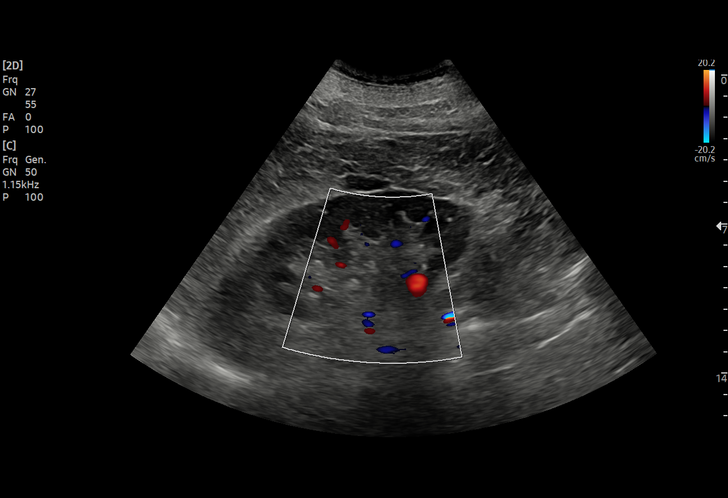
[im 70/99]
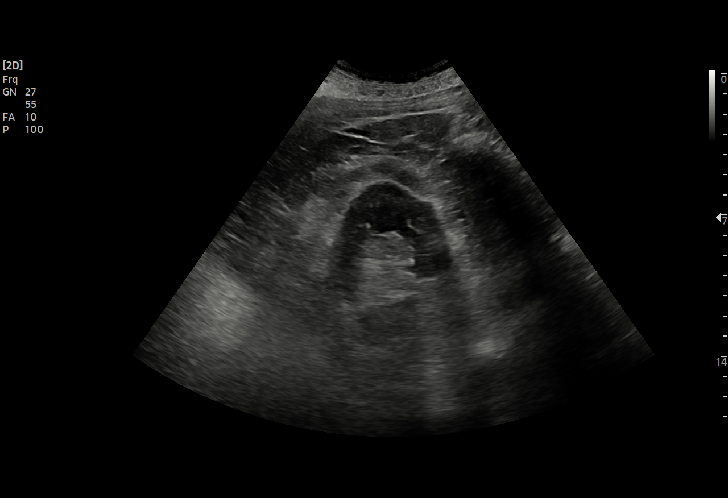
[im 78/99]
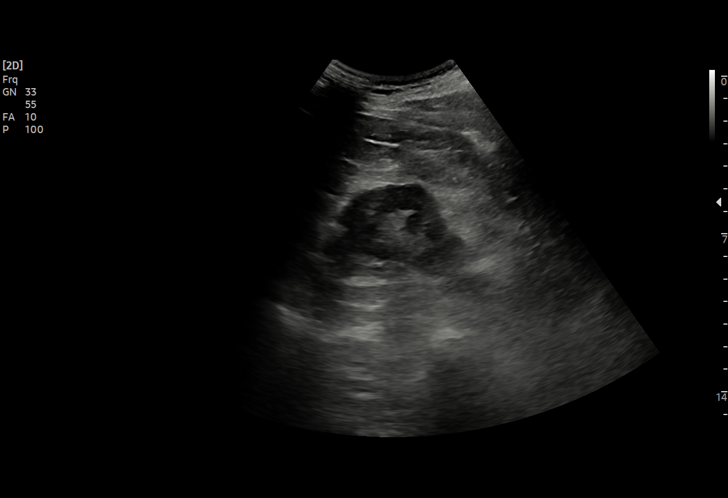
[im 82/99]
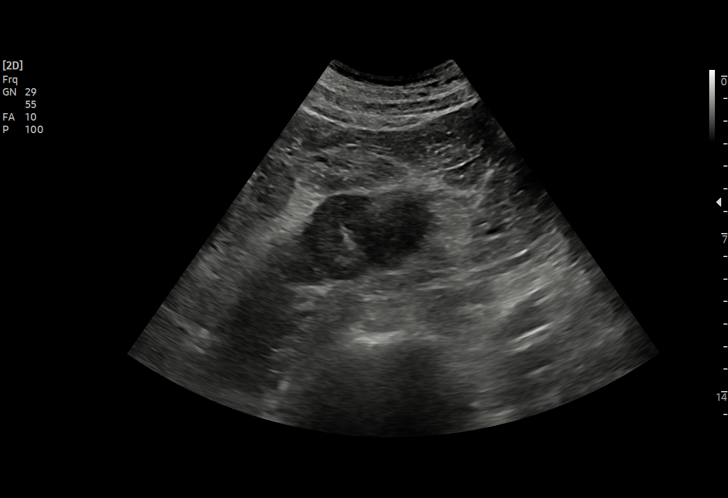
[im 90/99]
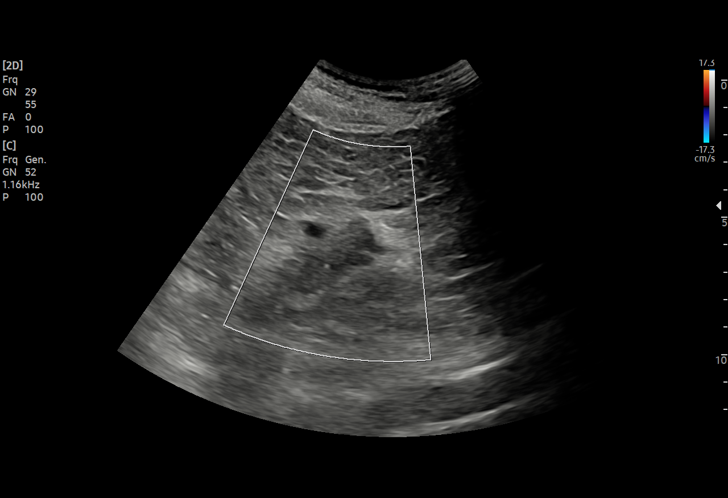
[im 99/99]
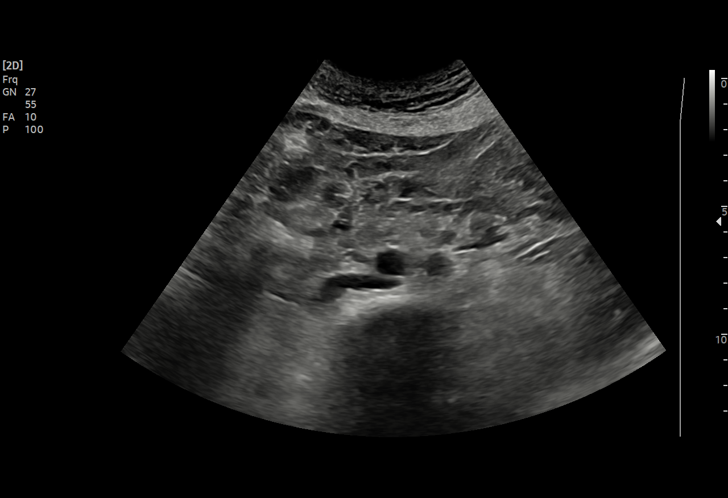

[15 of 25 positions shown; findings below may reference images not displayed]

FINDINGS: Gallbladder: No gallstones or wall thickening visualized. No
sonographic Murphy sign noted by sonographer.

Common bile duct: Diameter: 5 mm

Liver: No focal lesion identified. Within normal limits in
parenchymal echogenicity. Portal vein is patent on color Doppler
imaging with normal direction of blood flow towards the liver.

IVC: Not well visualized.

Pancreas: Visualized portions of the head and body are unremarkable.
Tail is obscured by bowel gas.

Spleen: Grossly normal in size. 2.4 x 1.9 x 2.1 cm hyperechoic focus
within the inferior aspect of the splenic hilum could reflect a
small splenic hemangioma. No other focal abnormalities.

Right Kidney: Length: 10.9 cm. Echogenicity within normal limits. 17
mm peripelvic cyst. No hydronephrosis or nephrolithiasis.

Left Kidney: Length: 10.3 cm. Normal echogenicity. 9 mm cortical
cyst midpole. No hydronephrosis or nephrolithiasis.

Abdominal aorta: No aneurysm visualized.

Other findings: None.
IMPRESSION: 1. Small bilateral renal cysts.
2. Indeterminate 2.4 cm hyperechoic focus inferior splenic hilum,
favor hemangioma.
3. Limited visualization of the IVC and pancreas due to bowel gas.

## 2022-07-12 ENCOUNTER — Encounter: Payer: Self-pay | Admitting: Nurse Practitioner

## 2022-07-12 ENCOUNTER — Ambulatory Visit (INDEPENDENT_AMBULATORY_CARE_PROVIDER_SITE_OTHER): Payer: Medicare Other | Admitting: Nurse Practitioner

## 2022-07-12 ENCOUNTER — Other Ambulatory Visit: Payer: Self-pay | Admitting: Nurse Practitioner

## 2022-07-12 VITALS — BP 122/74 | HR 97 | Temp 97.3°F | Ht 69.0 in | Wt 179.0 lb

## 2022-07-12 DIAGNOSIS — L821 Other seborrheic keratosis: Secondary | ICD-10-CM

## 2022-07-12 DIAGNOSIS — Z7901 Long term (current) use of anticoagulants: Secondary | ICD-10-CM | POA: Diagnosis not present

## 2022-07-12 DIAGNOSIS — I6529 Occlusion and stenosis of unspecified carotid artery: Secondary | ICD-10-CM | POA: Diagnosis not present

## 2022-07-12 DIAGNOSIS — Z66 Do not resuscitate: Secondary | ICD-10-CM

## 2022-07-12 DIAGNOSIS — Z23 Encounter for immunization: Secondary | ICD-10-CM

## 2022-07-12 DIAGNOSIS — Z952 Presence of prosthetic heart valve: Secondary | ICD-10-CM | POA: Diagnosis not present

## 2022-07-12 DIAGNOSIS — D6869 Other thrombophilia: Secondary | ICD-10-CM | POA: Diagnosis not present

## 2022-07-12 DIAGNOSIS — K219 Gastro-esophageal reflux disease without esophagitis: Secondary | ICD-10-CM

## 2022-07-12 LAB — POCT INR: INR: 2.8 (ref 2.0–3.0)

## 2022-07-12 NOTE — Progress Notes (Signed)
Careteam: Patient Care Team: [name], NP as PCP - General (Geriatric Medicine)  PLACE OF SERVICE:  [place] Directive information Does Patient Have a Medical Advance Directive?: Yes, Type of Advance Directive: Out of facility DNR (pink MOST or yellow form);[place];Living will, Pre-existing out of facility DNR order (yellow form or pink MOST form): Yellow form placed in chart (order not valid for inpatient use), Does patient want to make changes to medical advance directive?: No - Patient declined  Allergies  Allergen Reactions   Aspirin     Other reaction(s): Other (See Comments) Can't take due to taking blood thinners   Morphine And Related Other (See Comments)    Other reaction(s): Other (See Comments) Hallucinations     Nitroglycerin     Severe headache   Promethazine Other (See Comments)    Other reaction(s): Other (See Comments) Hallucinations    Promethazine Hcl Other (See Comments)    Chief Complaint  Patient presents with   Anticoagulation    PT/INR check. No missed doses, no major diet changes, and no unusual bleeding. Examine raised area on back, patients significant other noticed last night, however not sure how long present. Patients dermatology office closed, needs a referral to another dermatologist.      HPI: Patient is a 76 y.o. male for follow up INR.   Last check was 3.7 on 3/28, coumadin decreased to 3.5 mg daily, INR today at 2.7, goal INR between 3-3.5. due to aortic valve replacement.  No signs of bleeding or bruising noted.   Has some moles on his back that have been there, one area that was there last night but no longer present.   Review of Systems:  Review of Systems  Constitutional:  Negative for chills, fever and weight loss.  HENT:  Negative for tinnitus.   Respiratory:  Negative for cough, sputum production and shortness of breath.   Cardiovascular:  Negative for chest pain, palpitations and  leg swelling.  Gastrointestinal:  Negative for abdominal pain, constipation, diarrhea and heartburn.  Genitourinary:  Negative for dysuria, frequency and urgency.  Musculoskeletal:  Negative for back pain, falls, joint pain and myalgias.  Skin: Negative.   Neurological:  Negative for dizziness and headaches.  Psychiatric/Behavioral:  Negative for depression and memory loss. The patient does not have insomnia.     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   Right Alar Crease   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   Right Temple (in situ) (curet and 5FU)   Past Surgical History:  Procedure Laterality Date   AORTIC VALVE REPLACEMENT     mechanical   BIOPSY  03/28/2021   Procedure: BIOPSY;  Surgeon: Ronald Lobo, MD;  Location: [facility];  Service: Endoscopy;;   BLADDER SURGERY  1983   Bladder Cancer Surgery    ESOPHAGOGASTRODUODENOSCOPY (EGD) WITH PROPOFOL N/A 03/28/2021   Procedure: ESOPHAGOGASTRODUODENOSCOPY (EGD) WITH PROPOFOL;  Surgeon: Ronald Lobo, MD;  Location: [facility];  Service: Endoscopy;  Laterality: N/A;   KNEE SURGERY Right 1980   KNEE SURGERY Left 1982   OTHER SURGICAL HISTORY  1992   stomach muscles removed due to Coumadin caused bleeding.   SKIN LESION EXCISION     Some cancerous   SKIN SURGERY  01/30/2022   Basal Cell on side of nose    TONSILLECTOMY  1965   Social History:   reports that he has never smoked. His smokeless tobacco use includes snuff. He reports that he does not currently use alcohol. He reports that he does not use drugs.  Family History  Problem Relation Age of Onset   Heart disease Neg Hx     Medications: Patient's Medications  New Prescriptions   No medications on file  Previous Medications   ACETAMINOPHEN (TYLENOL) 500 MG TABLET    Take 500 mg by mouth daily as needed for moderate pain.   ALBUTEROL (VENTOLIN HFA) 108 (90 BASE) MCG/ACT INHALER    Inhale 1 puff into the lungs every 6 (six) hours as needed for wheezing or shortness of breath.   CHOLECALCIFEROL (VITAMIN D-3 PO)    Take 1 tablet by mouth 2 (two) times daily. Dose unknown   CICLOPIROX (PENLAC) 8 % SOLUTION    Apply topically at bedtime. Apply over nail and surrounding skin. Apply daily over previous coat. After seven (7) days, may remove with alcohol and continue cycle.   DOCUSATE SODIUM (COLACE) 100 MG CAPSULE    Take 100 mg by mouth as needed for mild constipation.   FLUTICASONE (FLONASE) 50 MCG/ACT NASAL SPRAY    Place 1 spray into both nostrils 2 (two) times daily.   FUROSEMIDE (LASIX) 20 MG TABLET    Take 1 tablet (20 mg total) by mouth daily as needed for fluid or edema (3lb weight gain).   IRON, FERROUS SULFATE, 325 (65 FE) MG TABS    Take 325 mg by mouth daily.   LORATADINE (CLARITIN) 10 MG TABLET    Take 1 tablet (10 mg total) by mouth daily.   LOVASTATIN (MEVACOR) 40 MG TABLET    TAKE ONE TABLET BY MOUTH AT BEDTIME   PANTOPRAZOLE (PROTONIX) 40 MG TABLET    TAKE ONE TABLET BY MOUTH TWICE A DAY   PROPYLENE GLYCOL (SYSTANE BALANCE) 0.6 % SOLN    Apply 1 drop to eye as needed (for dry eye).   RAMIPRIL (ALTACE) 10 MG CAPSULE    TAKE ONE CAPSULE BY MOUTH TWICE A DAY   TRAZODONE (DESYREL) 50 MG TABLET    Take 0.5 tablets (25 mg total) by mouth at bedtime.   VITAMIN B-12 (CYANOCOBALAMIN) 1000 MCG TABLET    Take 1,000 mcg by mouth daily.    WARFARIN (COUMADIN) 3 MG TABLET    TAKE 1 TABLET BY MOUTH DAILY ALONG WITH 0.5 MG TABLET FOR TOTAL DOSE OF 3.5 MG DAILY  Modified Medications   No medications on file  Discontinued Medications   No medications on file    Physical Exam:  Vitals:   07/12/22 1101  BP: 122/74  Pulse: 97  Temp: (!) 97.3 F (36.3 C)  TempSrc: Temporal  SpO2: 96%  Weight: 179 lb (81.2 kg)  Height: '5\' 9"'$  (1.753 m)   Body mass index is 26.43 kg/m. Wt Readings from Last 3 Encounters:  07/12/22 179  lb (81.2 kg)  06/28/22 181 lb 3.2 oz (82.2 kg)  06/21/22 182 lb (82.6 kg)    Physical Exam Constitutional:      General: He is not in acute distress.    Appearance: He is well-developed. He is not diaphoretic.  HENT:     Head: Normocephalic and atraumatic.     Right Ear: External ear normal.     Left Ear: External ear normal.     Mouth/Throat:     Pharynx: No oropharyngeal exudate.  Eyes:     Conjunctiva/sclera: Conjunctivae normal.     Pupils: Pupils are equal, round, and reactive to light.  Cardiovascular:     Rate and Rhythm: Normal rate and regular rhythm.     Heart sounds: Murmur heard.  Pulmonary:     Effort: Pulmonary effort is normal.     Breath sounds: Normal breath sounds.  Abdominal:     General: Bowel sounds are normal.     Palpations: Abdomen is soft.  Musculoskeletal:        General: No tenderness.     Cervical back: Normal range of motion and neck supple.     Right lower leg: No edema.     Left lower leg: No edema.  Skin:    General: Skin is warm and dry.     Comments: Seborrheic keratosis scattered on back  Neurological:     Mental Status: He is alert and oriented to person, place, and time.     Labs reviewed: Basic Metabolic Panel: Recent Labs    01/22/22 1539 03/08/22 1618 03/15/22 0830  NA 137 138 138  K 5.4* 4.5 4.7  CL 100 102 104  CO2 '23 29 29  '$ GLUCOSE 75 84 131*  BUN '18 19 14  '$ CREATININE 1.46* 1.25 1.37*  CALCIUM 9.7 8.9 9.0   Liver Function  Tests: Recent Labs    10/07/21 1235 03/15/22 0830  AST 17 14  ALT 9 15  BILITOT 0.4 0.4  PROT 6.0* 5.5*   No results for input(s): "LIPASE", "AMYLASE" in the last 8760 hours. No results for input(s): "AMMONIA" in the last 8760 hours. CBC: Recent Labs    10/07/21 1235 01/22/22 1539 03/08/22 1618  WBC 5.2 9.8 10.7  NEUTROABS 3,333  --  7,608  HGB 14.6 14.8 14.9  HCT 43.8 44.1 44.9  MCV 89.4 90 90.0  PLT 235 265 291   Lipid Panel: Recent Labs    03/15/22 0830  CHOL 188  HDL 49  LDLCALC 107*  TRIG 202*  CHOLHDL 3.8   TSH: No results for input(s): "TSH" in the last 8760 hours. A1C: No results found for: "HGBA1C"   Assessment/Plan 1. Long-term (current) use of anticoagulants, INR goal 3- 3.5 - POC INR below goal at 2.8 -will have him increase coumadin to 4 mg on every other Monday and to do coumadin 3.6 mg all other days.   2. 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 H/O aortic valve replacement -continues on coumadin, goal INR 3-3.5  4. Do not resuscitate - Do not attempt resuscitation (DNR)  5. Need for influenza vaccination - Flu Vaccine QUAD High Dose(Fluad)  6. Seborrheic keratosis -noted today, to monitor.   Follow up in 3 weeks for INR  Kayti Poss K. [place], [facility] [phone]

## 2022-07-12 NOTE — Patient Instructions (Signed)
Follow up INR on 10/6- Friday   Coumadin 3.5 mg daily except every other Monday take 4 mg   Take 4 mg today and then again in 2 weeks on Monday Coumadin 3.5 mg all other days

## 2022-07-14 ENCOUNTER — Encounter: Payer: Self-pay | Admitting: *Deleted

## 2022-07-14 DIAGNOSIS — I6523 Occlusion and stenosis of bilateral carotid arteries: Secondary | ICD-10-CM | POA: Insufficient documentation

## 2022-07-14 NOTE — Progress Notes (Deleted)
Cardiology Office Note   Date:  07/14/2022   ID:  Garrett Terry, Garrett Terry, Garrett Terry  PCP:  [name], NP  Cardiologist:   None Referring:  [name], NP  No chief complaint on file.     History of Present Illness: Garrett Terry is a 76 y.o. male who presents for follow up of AVR.    He had a history of mechanical aortic valve with a [place] strut Charmian Muff implanted at [facility] in January 1985.  He has been on anticoagulation since that time.   He returns for follow-up ***   ***  and says he is doing well.  He is been alcohol free for about 15 months.  He has had an episode to be very brief loss of consciousness when talking to his friend recently.  This is about 4 weeks ago.  He was sitting there and he just stopped responding and she said might have actually lost consciousness for about 60 seconds.  He came back and was fine.  She said this was very similar to what happened in 2022 when he was hospitalized.  I did review these records again for this visit and at that time he was thought to have GI bleeding and was anemic from diverticulitis.  However, he had an episode probably like that before and he has not really recall and so this may have been about the third event.  In the intervening months he has been doing well.  Not been having any presyncope or syncope.  He does not have any orthostatic symptoms.  He does not feel palpitations.  Has had no chest pressure, neck or arm discomfort.  He has had no weight gain or edema.  He has been fatigued.   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   Right Alar Crease   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   Right Temple (in situ) (curet and 5FU)    Past Surgical History:  Procedure Laterality Date   AORTIC VALVE REPLACEMENT     mechanical   BIOPSY  03/28/2021   Procedure: BIOPSY;  Surgeon: Ronald Lobo, MD;  Location: [facility];  Service: Endoscopy;;   BLADDER SURGERY  1983   Bladder Cancer Surgery    ESOPHAGOGASTRODUODENOSCOPY (EGD) WITH PROPOFOL N/A 03/28/2021   Procedure: ESOPHAGOGASTRODUODENOSCOPY (EGD) WITH PROPOFOL;  Surgeon: Ronald Lobo, MD;  Location: [facility];  Service: Endoscopy;  Laterality: N/A;   KNEE SURGERY Right 1980   KNEE SURGERY Left 1982   OTHER SURGICAL HISTORY  1992   stomach muscles removed due to Coumadin caused bleeding.   SKIN LESION EXCISION     Some cancerous   SKIN SURGERY  01/30/2022   Basal Cell on side of nose   TONSILLECTOMY  1965     Current Outpatient Medications  Medication Sig Dispense Refill   acetaminophen (TYLENOL) 500 MG tablet Take 500 mg by mouth daily as needed for moderate pain.     albuterol (VENTOLIN HFA) 108 (90 Base) MCG/ACT inhaler Inhale 1 puff into the lungs every 6 (six) hours as needed for wheezing or shortness of breath. 18 g 1   Cholecalciferol (VITAMIN D-3 PO) Take 1 tablet by mouth 2 (two) times daily. Dose unknown     ciclopirox (PENLAC) 8 % solution Apply topically at bedtime. Apply over nail and surrounding skin. Apply daily over previous coat. After seven (7) days, may remove with alcohol and continue cycle. 6.6 mL 0   docusate sodium (COLACE) 100 MG capsule Take 100 mg by mouth as needed for mild constipation.     fluticasone (FLONASE) 50 MCG/ACT nasal spray Place 1 spray  into both nostrils 2 (two) times daily. 16 g 6   furosemide (LASIX) 20 MG tablet Take 1 tablet (20 mg total) by mouth daily as needed for fluid or edema (3lb weight gain).     Iron, Ferrous Sulfate, 325 (65 Fe) MG TABS Take 325 mg by mouth daily. 180 tablet 3   loratadine (CLARITIN) 10 MG tablet Take 1 tablet (10 mg total) by mouth daily. 30 tablet 11   lovastatin (MEVACOR) 40 MG tablet TAKE ONE TABLET BY MOUTH AT BEDTIME 90 tablet 1   pantoprazole (PROTONIX) 40 MG tablet TAKE ONE TABLET BY MOUTH TWICE A DAY 180 tablet 3   Propylene Glycol (SYSTANE BALANCE) 0.6 % SOLN Apply 1 drop to eye as needed (for dry eye).     ramipril (ALTACE) 10 MG capsule TAKE ONE CAPSULE BY MOUTH TWICE A DAY 180 capsule 2   traZODone (DESYREL) 50 MG tablet Take 0.5 tablets (25 mg total) by mouth at bedtime. 90 tablet 1   vitamin B-12 (CYANOCOBALAMIN) 1000 MCG tablet Take 1,000 mcg by mouth daily.     warfarin (COUMADIN) 3 MG tablet TAKE 1 TABLET BY MOUTH DAILY ALONG WITH 0.5 MG TABLET FOR TOTAL DOSE OF 3.5 MG DAILY 30 tablet 0   No current facility-administered medications for this visit.    Allergies:   Aspirin, Morphine and related, Nitroglycerin, Promethazine, and Promethazine hcl   ROS:  Please see the history of present illness.   Otherwise, review of systems are positive for ***.   All other systems are reviewed and negative.    PHYSICAL EXAM: VS:  There were no vitals taken for this visit. , BMI There is no height or weight on file to calculate BMI. GENERAL:  Well appearing NECK:  No jugular venous distention, waveform within normal limits, carotid upstroke brisk and symmetric, no bruits, no thyromegaly LUNGS:  Clear to auscultation bilaterally CHEST:  Unremarkable HEART:  PMI not displaced or sustained,S1 and S2 within normal limits, no S3, no S4, no clicks, no rubs, *** murmurs ABD:  Flat, positive bowel sounds normal in frequency in pitch, no bruits, no rebound, no guarding, no midline pulsatile mass,  no hepatomegaly, no splenomegaly EXT:  2 plus pulses throughout, no edema, no cyanosis no clubbing    ***GENERAL:  Well appearing NECK:  No jugular venous distention, waveform within normal limits, carotid upstroke brisk and symmetric, questionable bruits, no thyromegaly LUNGS:  Clear to auscultation bilaterally CHEST:  Well healed sternotomy scar. HEART:  PMI not displaced or sustained,S1 and mechanical S2 within normal limits, no S3, no S4, no clicks, no rubs, no murmurs ABD:  Flat, positive bowel sounds normal in frequency in pitch, no bruits, no rebound, no guarding, no midline pulsatile mass, no hepatomegaly, no splenomegaly EXT:  2 plus pulses throughout, no edema, no cyanosis no clubbing    EKG:  EKG is *** ordered today. The ekg ordered today demonstrates ***.   Rate ***, left axis deviation, interventricular conduction delay, premature ventricular contractions, no change from previous   Recent Labs: 03/08/2022: Hemoglobin 14.9; Platelets 291 03/15/2022: ALT 15; BUN 14; Creat 1.37; Potassium 4.7; Sodium 138    Lipid Panel    Component Value Date/Time   CHOL 188 03/15/2022 0830   TRIG 202 (H) 03/15/2022 0830   HDL 49 03/15/2022 0830   CHOLHDL 3.8 03/15/2022 0830   LDLCALC 107 (H) 03/15/2022 0830      Wt Readings from Last 3 Encounters:  07/12/22 179 lb (81.2 kg)  06/28/22 181 lb 3.2 oz (82.2 kg)  06/21/22 182 lb (82.6 kg)      Other studies Reviewed: Additional studies/ records that were reviewed today include: ***. Review of the above records demonstrates:  Please see elsewhere in the note.     ASSESSMENT AND PLAN:  AVR:  *** The patient has had a stable aortic valve replacement.  He has his Coumadin followed by his primary provider.  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  He understands that I would like him to have his INR closer to 3 up to 3-1/2.   I am going to check an echocardiogram again  this year given the event described above.     CKD: His last creatinine was *** 1.37 which has been unchanged.   SYNCOPE: ***  He understands the rules about not driving for 6 months after an event.  I am going to have him wear a 4-week monitor.  He is also going to get a wearable.  I will be checking the echo as above.  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  Follow up in June 2024.  ***  There is a questionable soft carotid bruit I will check carotid Dopplers  Current medicines are reviewed at length with the patient today.  The patient does not have concerns regarding medicines.  The following changes have been made: ***  Labs/ tests ordered today include:  ***  No orders of the defined types were placed in this encounter.    Disposition:   FU with me in ***   Signed, Minus Breeding, MD  07/14/2022 7:25 AM    [facility]

## 2022-07-16 ENCOUNTER — Ambulatory Visit: Payer: Medicare Other | Admitting: Cardiology

## 2022-07-16 DIAGNOSIS — I6523 Occlusion and stenosis of bilateral carotid arteries: Secondary | ICD-10-CM

## 2022-07-16 DIAGNOSIS — N182 Chronic kidney disease, stage 2 (mild): Secondary | ICD-10-CM

## 2022-07-16 DIAGNOSIS — Z952 Presence of prosthetic heart valve: Secondary | ICD-10-CM

## 2022-07-16 DIAGNOSIS — R55 Syncope and collapse: Secondary | ICD-10-CM

## 2022-07-20 NOTE — Progress Notes (Unsigned)
Cardiology Office Note   Date:  07/21/2022   ID:  Trayveon, Beckford November 04, 1945, MRN [id number]  PCP:  [name], NP  Cardiologist:   None Referring:  [name], NP  Chief Complaint  Patient presents with   Dizziness      History of Present Illness: Garrett Terry is a 76 y.o. male who presents for follow up of AVR.    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  He has been on anticoagulation since that time.   He returns for follow-up he has had no new problems since I saw him.  He did go for cocaine the other day and got very hot and had near syncope but this was because he had not been hydrating and he was sitting on hot metal bench.  The patient denies any new symptoms such as chest discomfort, neck or arm discomfort. There has been no new shortness of breath, PND or orthopnea. There have been no reported palpitations.    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[place]) 03/28/2021   Atypical mole 11/18/2021   Right Lower Back (moderate with halo nevus effect) (wider shave)   Closed compression fracture of body of L1 vertebra (HCC) 01/60/1093   Complication of anesthesia    PONV   Compression fracture of T12 vertebra (HCC) 03/28/2021   Depression    History of bladder cancer    History of pneumonia 1959   History of prostate cancer    History of scarlet fever 1956   History of stroke 2009   Hypertension    Long term current use of anticoagulant 03/22/2011   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   Right Alar Crease   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   Right Temple (in situ) (curet and 5FU)    Past Surgical History:  Procedure Laterality Date   AORTIC VALVE REPLACEMENT      mechanical   BIOPSY  03/28/2021   Procedure: BIOPSY;  Surgeon: Ronald Lobo, MD;  Location: [facility];  Service: Endoscopy;;   BLADDER SURGERY  1983   Bladder Cancer Surgery    ESOPHAGOGASTRODUODENOSCOPY (EGD) WITH PROPOFOL N/A 03/28/2021   Procedure: ESOPHAGOGASTRODUODENOSCOPY (EGD) WITH PROPOFOL;  Surgeon: Ronald Lobo, MD;  Location: [facility];  Service: Endoscopy;  Laterality: N/A;   KNEE SURGERY Right 1980   KNEE SURGERY Left 1982   OTHER SURGICAL HISTORY  1992   stomach muscles removed due to Coumadin caused bleeding.   SKIN LESION EXCISION     Some cancerous   SKIN SURGERY  01/30/2022   Basal Cell on side of nose   TONSILLECTOMY  1965     Current Outpatient Medications  Medication Sig Dispense Refill   acetaminophen (TYLENOL) 500 MG tablet Take 500 mg by mouth daily as needed for moderate pain.     albuterol (VENTOLIN HFA) 108 (90 Base) MCG/ACT inhaler Inhale 1 puff into the lungs every 6 (six) hours as needed for wheezing or shortness of breath. 18 g 1   cetirizine (ZYRTEC) 10 MG tablet Take 10 mg by mouth daily. Take 1 tablet by mouth daily for allergies.     Cholecalciferol (VITAMIN D-3  PO) Take 1 tablet by mouth 2 (two) times daily. Dose unknown     docusate sodium (COLACE) 100 MG capsule Take 100 mg by mouth as needed for mild constipation.     fluticasone (FLONASE) 50 MCG/ACT nasal spray Place 1 spray into both nostrils 2 (two) times daily. 16 g 6   furosemide (LASIX) 20 MG tablet Take 1 tablet (20 mg total) by mouth daily as needed for fluid or edema (3lb weight gain).     Iron, Ferrous Sulfate, 325 (65 Fe) MG TABS Take 325 mg by mouth daily. 180 tablet 3   lovastatin (MEVACOR) 40 MG tablet TAKE ONE TABLET BY MOUTH AT BEDTIME 90 tablet 1   pantoprazole (PROTONIX) 40 MG tablet TAKE ONE TABLET BY MOUTH TWICE A DAY 180 tablet 3   Propylene Glycol (SYSTANE BALANCE) 0.6 % SOLN Apply 1 drop to eye as needed (for dry eye).     ramipril (ALTACE) 10 MG capsule TAKE  ONE CAPSULE BY MOUTH TWICE A DAY 180 capsule 2   traZODone (DESYREL) 50 MG tablet Take 0.5 tablets (25 mg total) by mouth at bedtime. 90 tablet 1   vitamin B-12 (CYANOCOBALAMIN) 1000 MCG tablet Take 1,000 mcg by mouth daily.     warfarin (COUMADIN) 3 MG tablet TAKE 1 TABLET BY MOUTH DAILY ALONG WITH 0.5 MG TABLET FOR TOTAL DOSE OF 3.5 MG DAILY 30 tablet 0   No current facility-administered medications for this visit.    Allergies:   Aspirin, Morphine and related, Nitroglycerin, Promethazine, and Promethazine hcl   ROS:  Please see the history of present illness.   Otherwise, review of systems are positive for none.   All other systems are reviewed and negative.    PHYSICAL EXAM: VS:  BP (!) 120/56   Pulse 98   Ht '5\' 9"'$  (1.753 m)   Wt 179 lb 9.6 oz (81.5 kg)   SpO2 97%   BMI 26.52 kg/m  , BMI Body mass index is 26.52 kg/m. GENERAL:  Well appearing NECK:  No jugular venous distention, waveform within normal limits, carotid upstroke brisk and symmetric, no bruits, no thyromegaly LUNGS:  Clear to auscultation bilaterally CHEST: Well-healed sternotomy scar HEART:  PMI not displaced or sustained,S1 and S2 mechanical within normal limits, no S3, no S4, no clicks, no rubs, 2 out of 6 apical systolic murmur radiating slightly at aortic outflow tract, no diastolic murmurs ABD:  Flat, positive bowel sounds normal in frequency in pitch, no bruits, no rebound, no guarding, no midline pulsatile mass, no hepatomegaly, no splenomegaly EXT:  2 plus pulses throughout, no edema, no cyanosis no clubbing   EKG:  EKG is not ordered today.   Recent Labs: 03/08/2022: Hemoglobin 14.9; Platelets 291 03/15/2022: ALT 15; BUN 14; Creat 1.37; Potassium 4.7; Sodium 138    Lipid Panel    Component Value Date/Time   CHOL 188 03/15/2022 0830   TRIG 202 (H) 03/15/2022 0830   HDL 49 03/15/2022 0830   CHOLHDL 3.8 03/15/2022 0830   LDLCALC 107 (H) 03/15/2022 0830      Wt Readings from Last 3 Encounters:   07/21/22 179 lb 9.6 oz (81.5 kg)  07/12/22 179 lb (81.2 kg)  06/28/22 181 lb 3.2 oz (82.2 kg)      Other studies Reviewed: Additional studies/ records that were reviewed today include: Echocardiogram. Review of the above records demonstrates:  Please see elsewhere in the note.     ASSESSMENT AND PLAN:  AVR: This was normal.  He has  his Coumadin followed and is up-to-date.  He would need a Lovenox bridge if he would ever come off of Coumadin because he has had previous TIAs.   CKD: His last creatinine was 1.37.  No change in therapy.   SYNCOPE: He has had no further syncope.  He was told about the 70-monthdriving rule.  He did not want to wear a monitor as above.  He would let me know however if he has another event.  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  Follow up in June 2024.    Current medicines are reviewed at length with the patient today.  The patient does not have concerns regarding medicines.  The following changes have been made: None  Labs/ tests ordered today include: None  No orders of the defined types were placed in this encounter.    Disposition:   FU with me in 12 months   Signed, JMinus Breeding MD  07/21/2022 10:37 AM    [facility]

## 2022-07-21 ENCOUNTER — Encounter: Payer: Self-pay | Admitting: Cardiology

## 2022-07-21 ENCOUNTER — Ambulatory Visit: Payer: Medicare Other | Attending: Cardiology | Admitting: Cardiology

## 2022-07-21 VITALS — BP 120/56 | HR 98 | Ht 69.0 in | Wt 179.6 lb

## 2022-07-21 DIAGNOSIS — N182 Chronic kidney disease, stage 2 (mild): Secondary | ICD-10-CM | POA: Diagnosis not present

## 2022-07-21 DIAGNOSIS — R55 Syncope and collapse: Secondary | ICD-10-CM | POA: Insufficient documentation

## 2022-07-21 DIAGNOSIS — Z952 Presence of prosthetic heart valve: Secondary | ICD-10-CM | POA: Insufficient documentation

## 2022-07-21 DIAGNOSIS — I6523 Occlusion and stenosis of bilateral carotid arteries: Secondary | ICD-10-CM | POA: Diagnosis not present

## 2022-07-21 DIAGNOSIS — I6529 Occlusion and stenosis of unspecified carotid artery: Secondary | ICD-10-CM | POA: Diagnosis not present

## 2022-07-21 NOTE — Patient Instructions (Signed)
Medication Instructions:  No changes *If you need a refill on your cardiac medications before your next appointment, please call your pharmacy*   Lab Work: None ordered If you have labs (blood work) drawn today and your tests are completely normal, you will receive your results only by: [place] (if you have MyChart) OR A paper copy in the mail If you have any lab test that is abnormal or we need to change your treatment, we will call you to review the results.   Testing/Procedures: None ordered   Follow-Up: At [facility], you and your health needs are our priority.  As part of our continuing mission to provide you with exceptional heart care, we have created designated Provider Care Teams.  These Care Teams include your primary Cardiologist (physician) and Advanced Practice Providers (APPs -  Physician Assistants and Nurse Practitioners) who all work together to provide you with the care you need, when you need it.  We recommend signing up for the patient portal called "MyChart".  Sign up information is provided on this After Visit Summary.  MyChart is used to connect with patients for Virtual Visits (Telemedicine).  Patients are able to view lab/test results, encounter notes, upcoming appointments, etc.  Non-urgent messages can be sent to your provider as well.   To learn more about what you can do with MyChart, go to [email or web address].    Your next appointment:   12 month(s)  The format for your next appointment:   In Person  Provider:   Dr. Percival Spanish   Important Information About Sugar

## 2022-07-25 ENCOUNTER — Other Ambulatory Visit: Payer: Self-pay | Admitting: Nurse Practitioner

## 2022-07-25 DIAGNOSIS — Z7901 Long term (current) use of anticoagulants: Secondary | ICD-10-CM

## 2022-07-26 LAB — COLOGUARD: Cologuard: POSITIVE — AB

## 2022-08-02 ENCOUNTER — Other Ambulatory Visit: Payer: Self-pay

## 2022-08-02 DIAGNOSIS — Z7901 Long term (current) use of anticoagulants: Secondary | ICD-10-CM

## 2022-08-02 DIAGNOSIS — Z952 Presence of prosthetic heart valve: Secondary | ICD-10-CM

## 2022-08-02 MED ORDER — WARFARIN SODIUM 3 MG PO TABS: ORAL_TABLET | ORAL | 0 refills | Status: DC

## 2022-08-02 MED ORDER — WARFARIN SODIUM 1 MG PO TABS
ORAL_TABLET | ORAL | 0 refills | Status: DC
Start: 2022-08-02 — End: 2022-08-20

## 2022-08-04 ENCOUNTER — Encounter: Payer: Self-pay | Admitting: Nurse Practitioner

## 2022-08-06 ENCOUNTER — Telehealth: Payer: Self-pay | Admitting: Nurse Practitioner

## 2022-08-06 ENCOUNTER — Encounter: Payer: Self-pay | Admitting: Nurse Practitioner

## 2022-08-06 ENCOUNTER — Ambulatory Visit (INDEPENDENT_AMBULATORY_CARE_PROVIDER_SITE_OTHER): Payer: Medicare Other | Admitting: Nurse Practitioner

## 2022-08-06 VITALS — BP 134/80 | HR 60 | Temp 96.8°F | Ht 69.0 in | Wt 178.0 lb

## 2022-08-06 DIAGNOSIS — I6523 Occlusion and stenosis of bilateral carotid arteries: Secondary | ICD-10-CM

## 2022-08-06 DIAGNOSIS — Z7901 Long term (current) use of anticoagulants: Secondary | ICD-10-CM

## 2022-08-06 DIAGNOSIS — D6869 Other thrombophilia: Secondary | ICD-10-CM | POA: Diagnosis not present

## 2022-08-06 DIAGNOSIS — Z952 Presence of prosthetic heart valve: Secondary | ICD-10-CM

## 2022-08-06 LAB — POCT INR: INR: 2.4 (ref 2.0–3.0)

## 2022-08-06 NOTE — Telephone Encounter (Signed)
Wife called to report pt having 2 injections at [facility] on 08/02/22. Wife says she does not know if that would have affected patient's coumadin at today's visit. Per wife: RT Hand, index finger and ring finger each had an injection for trigger finger.

## 2022-08-06 NOTE — Patient Instructions (Addendum)
Go back to coumadin 3.5 mg daily except Friday take coumadin 4 mg.   Follow up MONDAY OCTOBER 23rd

## 2022-08-06 NOTE — Progress Notes (Signed)
Careteam: Garrett Terry Care Team: [name], NP as PCP - General (Geriatric Medicine)  PLACE OF SERVICE:  [facility]  Advanced Directive information    Allergies  Allergen Reactions   Aspirin     Other reaction(s): Other (See Comments) Can't take due to taking blood thinners   Morphine And Related Other (See Comments)    Other reaction(s): Other (See Comments) Hallucinations     Nitroglycerin     Severe headache   Promethazine Other (See Comments)    Other reaction(s): Other (See Comments) Hallucinations    Promethazine Hcl Other (See Comments)    Chief Complaint  Garrett Terry presents with   Anticoagulation    PT/INR checkn no missed coumadin doses, no major diet changes, and no unusual bleeding      HPI: Garrett Terry is a 76 y.o. male for INR check.  Goal INR is 3-3.5 he is taking coumadin 3.5 mg daily except 4 mg every other Monday and INR now 2.4  No TIA like symptoms No abnormal bruising or bleeding, no chest pains or palpitations.   Having some difficulty getting to sleep then he has to get up to urinate.  Traditionally does not hydrate well.  Sinuses have been bothering him. Fall allergies   Lab Results  Component Value Date   INR 2.4 08/06/2022   INR 2.8 07/12/2022   INR 3.7 (A) 06/28/2022     Review of Systems:  Review of Systems  Constitutional:  Negative for chills, fever and weight loss.  HENT:  Negative for tinnitus.   Respiratory:  Negative for cough, sputum production and shortness of breath.   Cardiovascular:  Negative for chest pain, palpitations and leg swelling.  Gastrointestinal:  Negative for abdominal pain, constipation, diarrhea and heartburn.  Genitourinary:  Negative for dysuria, frequency and urgency.  Musculoskeletal:  Negative for back pain, falls, joint pain and myalgias.  Skin: Negative.   Neurological:  Negative for dizziness and headaches.  Psychiatric/Behavioral:  Negative for depression and memory loss. The Garrett Terry does  not have insomnia.     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   Right Alar Crease   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   Right Temple (in situ) (curet and 5FU)   Past Surgical History:  Procedure Laterality Date   AORTIC VALVE REPLACEMENT     mechanical   BIOPSY  03/28/2021   Procedure: BIOPSY;  Surgeon: Ronald Lobo, MD;  [facility];  Service: Endoscopy;;   BLADDER SURGERY  1983   Bladder Cancer Surgery    ESOPHAGOGASTRODUODENOSCOPY (EGD) WITH PROPOFOL N/A 03/28/2021   Procedure: ESOPHAGOGASTRODUODENOSCOPY (EGD) WITH PROPOFOL;  Surgeon: Ronald Lobo, MD;  [facility];  Service: Endoscopy;  Laterality: N/A;   KNEE SURGERY Right 1980   KNEE SURGERY Left 1982   OTHER  SURGICAL HISTORY  1992   stomach muscles removed due to Coumadin caused bleeding.   SKIN LESION EXCISION     Some cancerous   SKIN SURGERY  01/30/2022   Basal Cell on side of nose   TONSILLECTOMY  1965   Social History:   reports that he has never smoked. His smokeless tobacco use includes snuff. He reports that he does not currently use alcohol. He reports that he does not use drugs.  Family History  Problem Relation Age of Onset   Heart disease Neg Hx     Medications: Garrett Terry's Medications  New Prescriptions   No medications on file  Previous Medications    ACETAMINOPHEN (TYLENOL) 500 MG TABLET    Take 500 mg by mouth daily as needed for moderate pain.   ALBUTEROL (VENTOLIN HFA) 108 (90 BASE) MCG/ACT INHALER    Inhale 1 puff into the lungs every 6 (six) hours as needed for wheezing or shortness of breath.   CETIRIZINE (ZYRTEC) 10 MG TABLET    Take 10 mg by mouth daily. Take 1 tablet by mouth daily for allergies.   CHOLECALCIFEROL (VITAMIN D-3 PO)    Take 1 tablet by mouth 2 (two) times daily. Dose unknown   DOCUSATE SODIUM (COLACE) 100 MG CAPSULE    Take 100 mg by mouth as needed for mild constipation.   FLUTICASONE (FLONASE) 50 MCG/ACT NASAL SPRAY    Place 1 spray into both nostrils 2 (two) times daily.   FUROSEMIDE (LASIX) 20 MG TABLET    Take 1 tablet (20 mg total) by mouth daily as needed for fluid or edema (3lb weight gain).   IRON, FERROUS SULFATE, 325 (65 FE) MG TABS    Take 325 mg by mouth daily.   LOVASTATIN (MEVACOR) 40 MG TABLET    TAKE ONE TABLET BY MOUTH AT BEDTIME   PANTOPRAZOLE (PROTONIX) 40 MG TABLET    TAKE ONE TABLET BY MOUTH TWICE A DAY   PROPYLENE GLYCOL (SYSTANE BALANCE) 0.6 % SOLN    Apply 1 drop to eye as needed (for dry eye).   RAMIPRIL (ALTACE) 10 MG CAPSULE    TAKE ONE CAPSULE BY MOUTH TWICE A DAY   TRAZODONE (DESYREL) 50 MG TABLET    Take 0.5 tablets (25 mg total) by mouth at bedtime.   VITAMIN B-12 (CYANOCOBALAMIN) 1000 MCG TABLET    Take 1,000 mcg by mouth daily.   WARFARIN (COUMADIN) 1 MG TABLET    Take 1/2 tablet daily with '3mg'$  to total 3.'5mg'$  except every other Monday take 1 tablet along with '3mg'$  to total '4mg'$ .   WARFARIN (COUMADIN) 3 MG TABLET    TAKE 1 TABLET BY MOUTH DAILY ALONG WITH 0.5 MG TABLET FOR TOTAL DOSE OF 3.5 MG DAILY  Modified Medications   No medications on file  Discontinued Medications   No medications on file    Physical Exam:  Vitals:   08/04/22 1025  BP: 134/80  Pulse: 60  Temp: (!) 96.8 F (36 C)  SpO2: 96%  Weight: 178 lb (80.7 kg)  Height: '5\' 9"'$  (1.753 m)   Body mass index is 26.29  kg/m. Wt Readings from Last 3 Encounters:  08/04/22 178 lb (80.7 kg)  07/21/22 179 lb 9.6 oz (81.5 kg)  07/12/22 179 lb (81.2 kg)    Physical Exam Constitutional:      General: He is not in acute distress.    Appearance: He is well-developed. He is not diaphoretic.  HENT:     Head: Normocephalic  and atraumatic.     Right Ear: External ear normal.     Left Ear: External ear normal.     Mouth/Throat:     Pharynx: No oropharyngeal exudate.  Eyes:     Conjunctiva/sclera: Conjunctivae normal.     Pupils: Pupils are equal, round, and reactive to light.  Cardiovascular:     Rate and Rhythm: Normal rate and regular rhythm.     Heart sounds: Murmur heard.  Pulmonary:     Effort: Pulmonary effort is normal.     Breath sounds: Normal breath sounds.  Abdominal:     General: Bowel sounds are normal.     Palpations: Abdomen is soft.  Musculoskeletal:        General: No tenderness.     Cervical back: Normal range of motion and neck supple.     Right lower leg: No edema.     Left lower leg: No edema.  Skin:    General: Skin is warm and dry.  Neurological:     Mental Status: He is alert and oriented to person, place, and time.     Labs reviewed: Basic Metabolic Panel: Recent Labs    01/22/22 1539 03/08/22 1618 03/15/22 0830  NA 137 138 138  K 5.4* 4.5 4.7  CL 100 102 104  CO2 '23 29 29  '$ GLUCOSE 75 84 131*  BUN '18 19 14  '$ CREATININE 1.46* 1.25 1.37*  CALCIUM 9.7 8.9 9.0   Liver Function Tests: Recent Labs    10/07/21 1235 03/15/22 0830  AST 17 14  ALT 9 15  BILITOT 0.4 0.4  PROT 6.0* 5.5*   No results for input(s): "LIPASE", "AMYLASE" in the last 8760 hours. No results for input(s): "AMMONIA" in the last 8760 hours. CBC: Recent Labs    10/07/21 1235 01/22/22 1539 03/08/22 1618  WBC 5.2 9.8 10.7  NEUTROABS 3,333  --  7,608  HGB 14.6 14.8 14.9  HCT 43.8 44.1 44.9  MCV 89.4 90 90.0  PLT 235 265 291   Lipid Panel: Recent Labs    03/15/22 0830  CHOL 188   HDL 49  LDLCALC 107*  TRIG 202*  CHOLHDL 3.8   TSH: No results for input(s): "TSH" in the last 8760 hours. A1C: No results found for: "HGBA1C"   Assessment/Plan 1. Long-term (current) use of anticoagulants, INR goal 3-3.5 INR today 2.6, taking coumadin 3.'5mg'$  daily except 4 mg every other Monday.  - POC INR 2.6 which is below goal -will increase coumadin to 4 mg every Friday and continue 3.5 mg daily on all other days  2. 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 H/O aortic valve replacement -stable, continues on coumadin for anticoagulation goal 3-3.5   . 3 weeks for INR.  Carlos American. [place], [facility] [phone]

## 2022-08-09 ENCOUNTER — Ambulatory Visit: Payer: Medicare Other | Admitting: Nurse Practitioner

## 2022-08-16 ENCOUNTER — Telehealth: Payer: Self-pay | Admitting: *Deleted

## 2022-08-16 NOTE — Telephone Encounter (Signed)
Patient called wanting you to add Famotidine to his med list. When I called him back he had spelled the med with a s instead of a f.  I also found out Minda Ditto PA at [facility] put him on famotidine. Patient's wants to know if it can cause his INR to be off. He has an INR scheduled with you next Monday. Please advise.

## 2022-08-17 NOTE — Telephone Encounter (Signed)
Lets have him come to the lab on Friday for INR

## 2022-08-17 NOTE — Telephone Encounter (Signed)
Potentially could effect INR, we will recheck at appt on Monday, if he wants to have checked on Friday okay to move appt.

## 2022-08-18 NOTE — Telephone Encounter (Signed)
Called patient and left a detailed message. Garrett Terry wants patient to come in on Friday rather than wait till Monday. Explained to patient Garrett Terry is booked but [name] has afternoon openings. Waiting for patient to call back and schedule an appointment for Friday.

## 2022-08-19 IMAGING — CR DG FOOT COMPLETE 3+V*L*
3 series · 3 of 3 positions shown · non-contrast
Comparison: None.

CLINICAL DATA: Left foot and ankle pain after a fall.

EXAM:
LEFT FOOT - COMPLETE 3+ VIEW; LEFT ANKLE COMPLETE - 3+ VIEW

[foot ap]
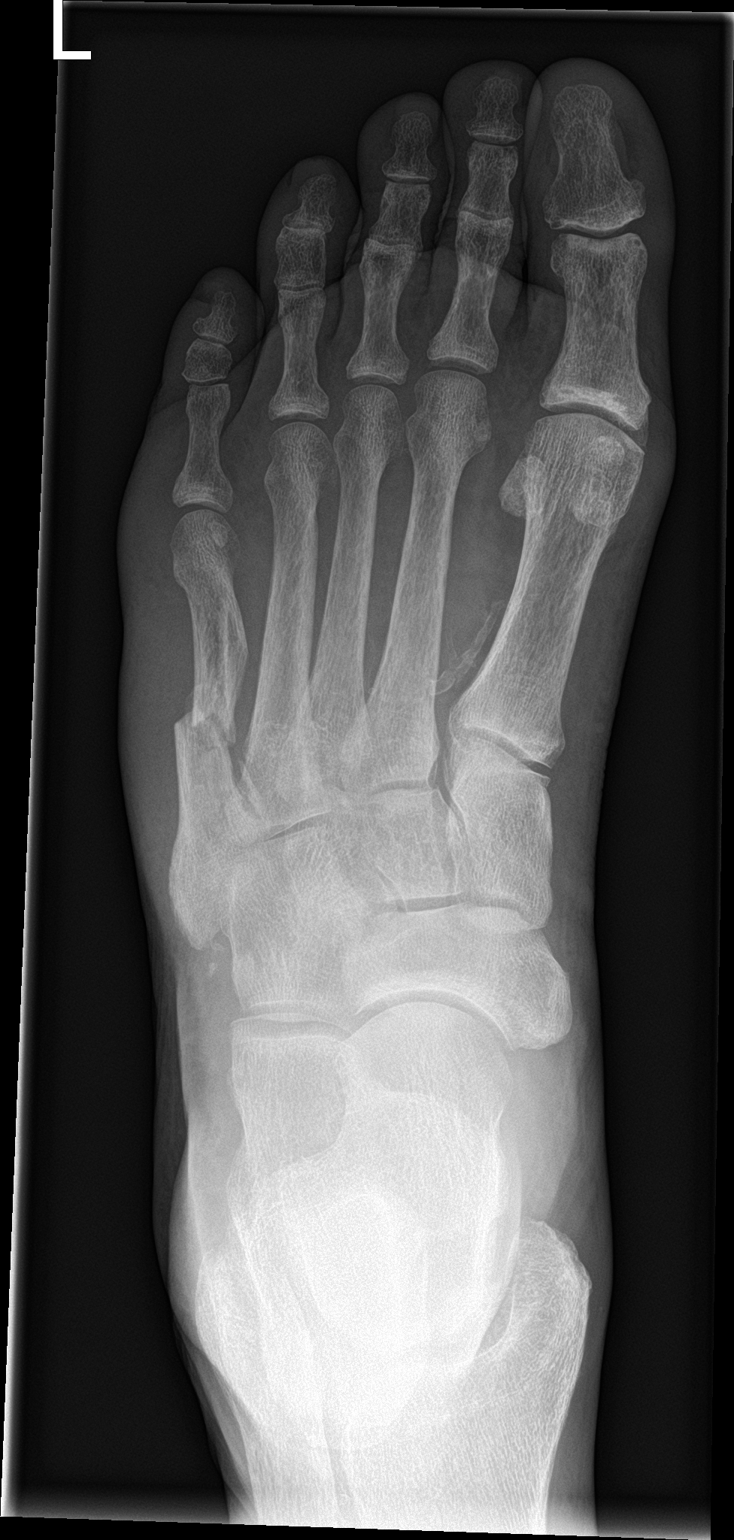

[foot obl]
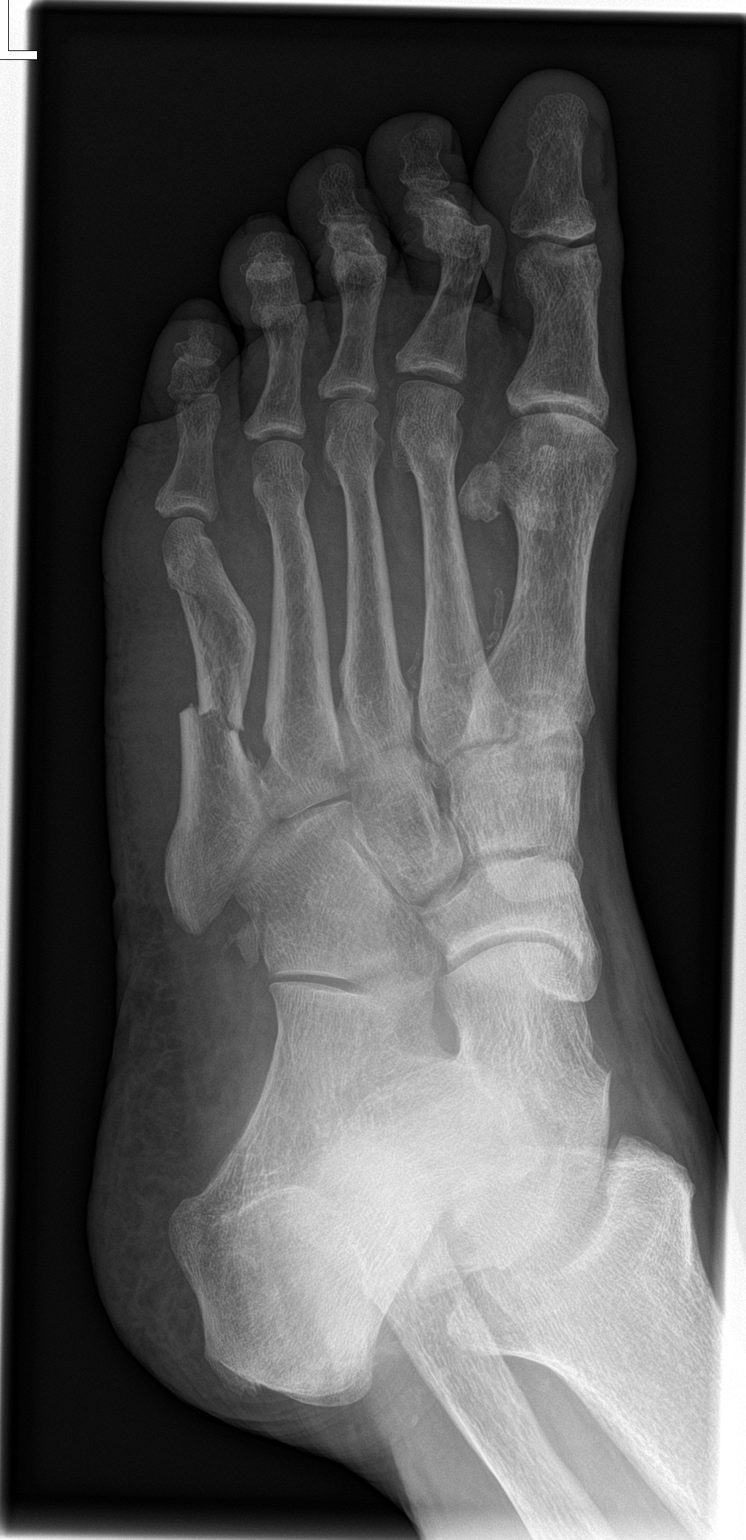

[foot lat]
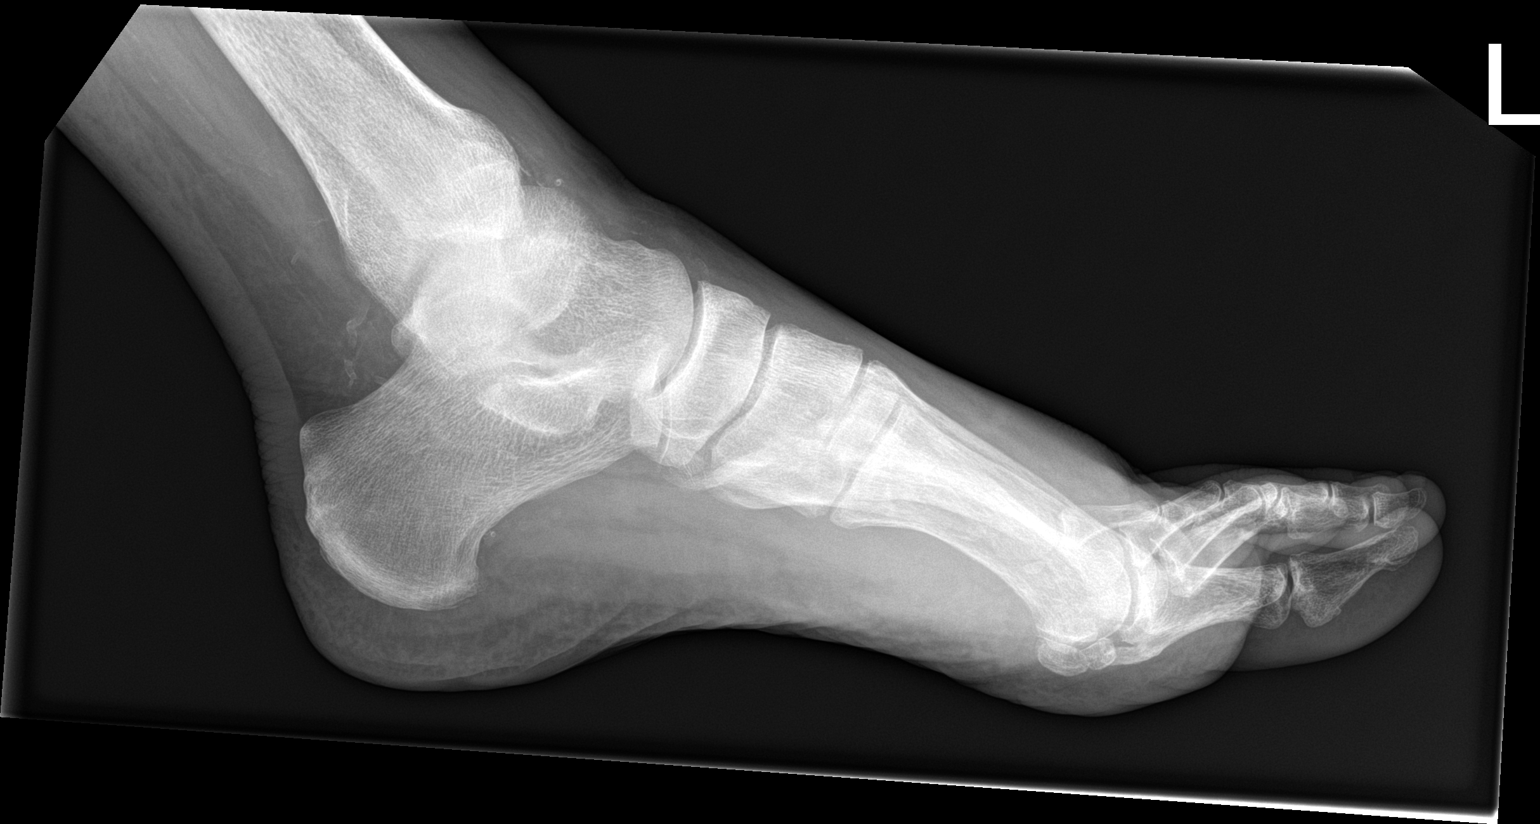

[3 of 3 positions shown; findings below may reference images not displayed]

FINDINGS: Three views of the left foot and three views of the left ankle are
obtained.

Left ankle demonstrates no evidence of acute fracture or
dislocation. Joint spaces are normal. No soft tissue swelling.

Left foot demonstrates acute appearing mostly transverse fracture of
the midshaft left fifth metatarsal bone with medial displacement of
the distal fracture fragment. There appears to be an underlying old
fracture deformity of the fifth metatarsal. Overlying soft tissue
swelling is present. No additional fractures are identified.
Vascular calcifications.
IMPRESSION: 1. No acute fracture or dislocation involving the left ankle.
2. Mostly transverse fracture of the midshaft left fifth metatarsal
bone with medial displacement of the distal fracture fragment. This
is superimposed on top of an apparent old fracture deformity.

## 2022-08-19 IMAGING — CR DG ANKLE COMPLETE 3+V*L*
3 series · 3 of 3 positions shown · non-contrast
Comparison: None.

CLINICAL DATA: Left foot and ankle pain after a fall.

EXAM:
LEFT FOOT - COMPLETE 3+ VIEW; LEFT ANKLE COMPLETE - 3+ VIEW

[ankle ap]
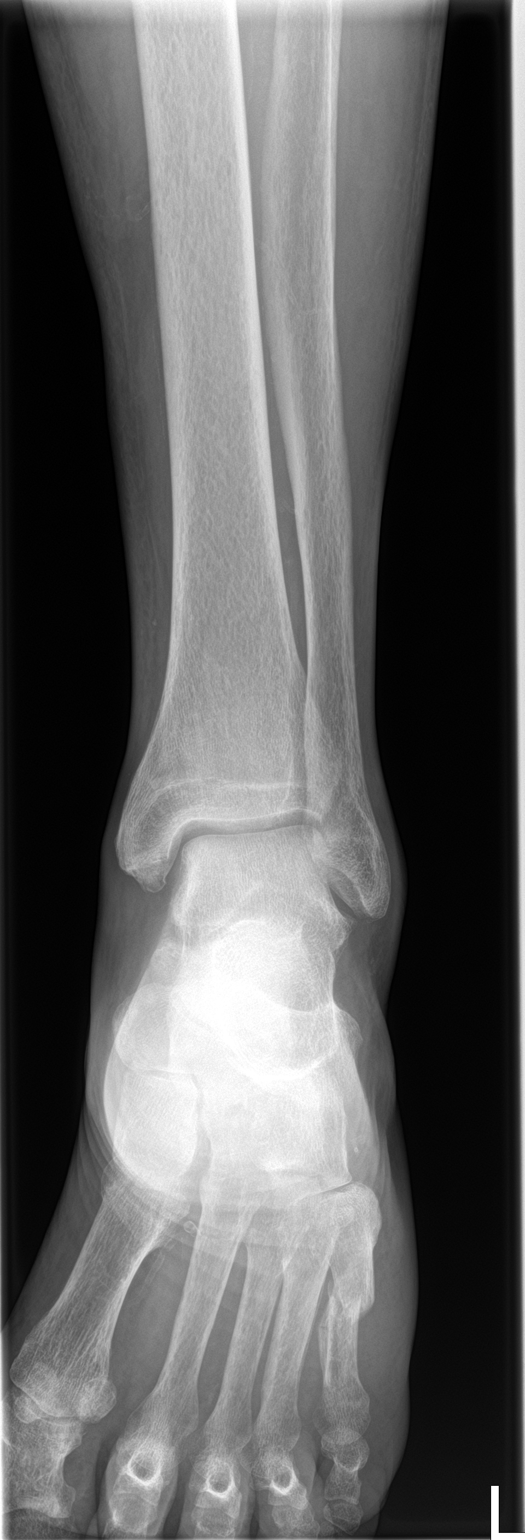

[ankle obl]
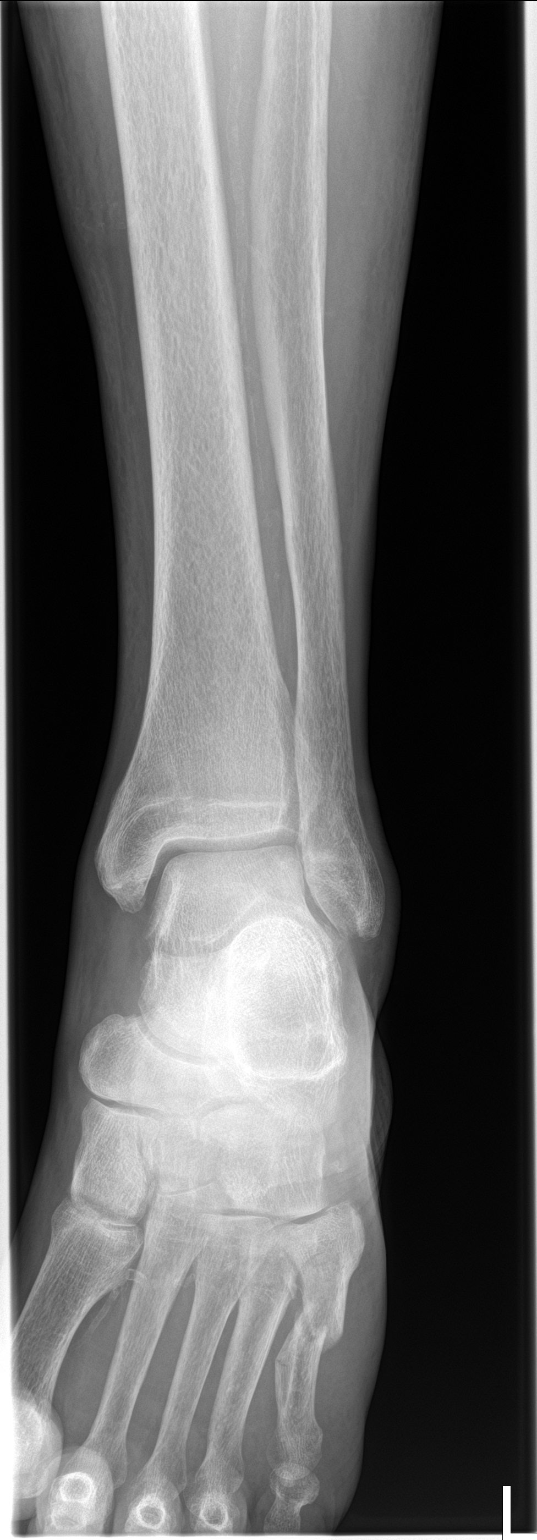

[ankle lat]
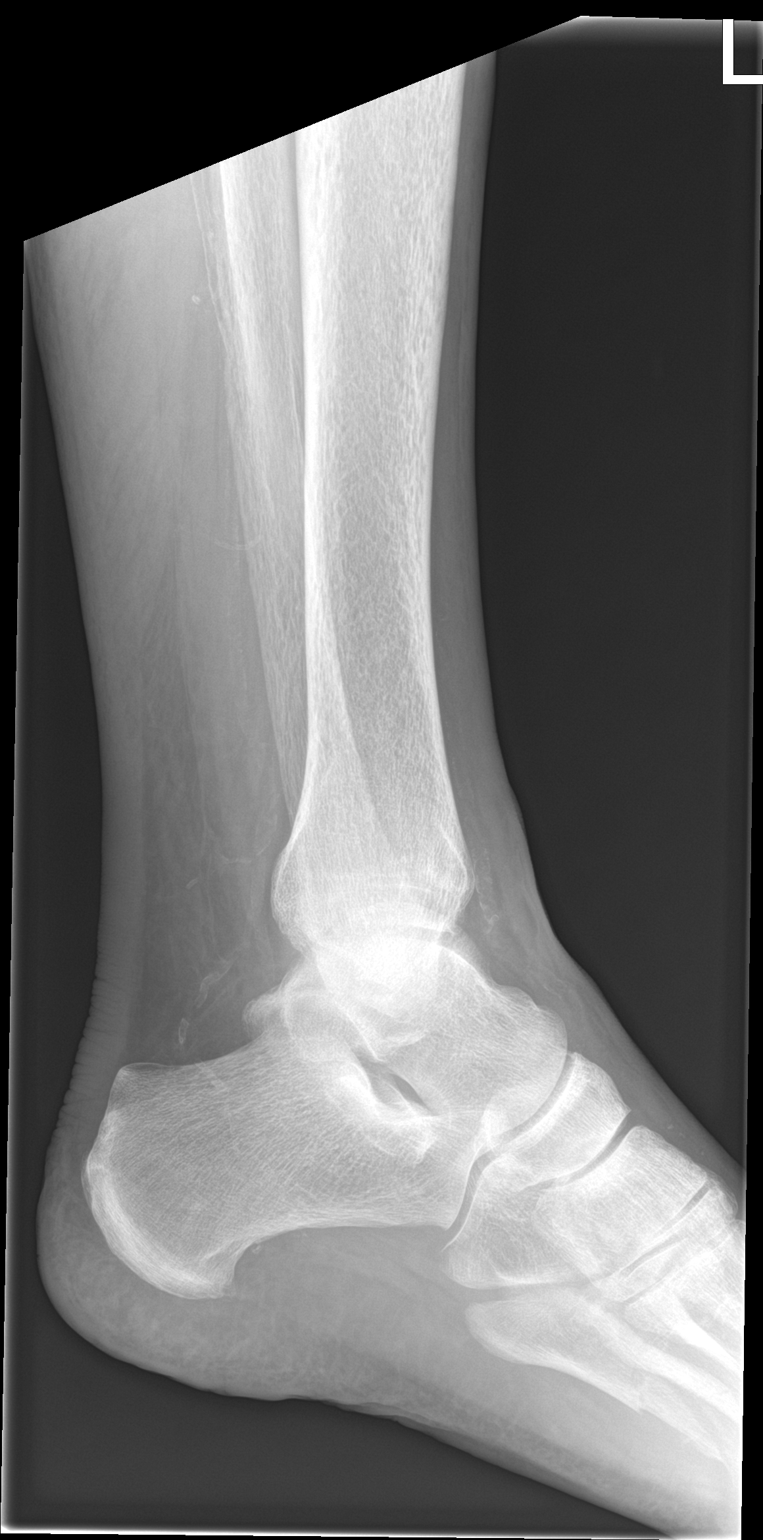

[3 of 3 positions shown; findings below may reference images not displayed]

FINDINGS: Three views of the left foot and three views of the left ankle are
obtained.

Left ankle demonstrates no evidence of acute fracture or
dislocation. Joint spaces are normal. No soft tissue swelling.

Left foot demonstrates acute appearing mostly transverse fracture of
the midshaft left fifth metatarsal bone with medial displacement of
the distal fracture fragment. There appears to be an underlying old
fracture deformity of the fifth metatarsal. Overlying soft tissue
swelling is present. No additional fractures are identified.
Vascular calcifications.
IMPRESSION: 1. No acute fracture or dislocation involving the left ankle.
2. Mostly transverse fracture of the midshaft left fifth metatarsal
bone with medial displacement of the distal fracture fragment. This
is superimposed on top of an apparent old fracture deformity.

## 2022-08-20 ENCOUNTER — Ambulatory Visit (INDEPENDENT_AMBULATORY_CARE_PROVIDER_SITE_OTHER): Payer: Medicare Other | Admitting: Nurse Practitioner

## 2022-08-20 ENCOUNTER — Encounter: Payer: Self-pay | Admitting: Nurse Practitioner

## 2022-08-20 VITALS — BP 132/80 | HR 89 | Temp 97.0°F | Resp 16 | Ht 69.0 in | Wt 182.6 lb

## 2022-08-20 DIAGNOSIS — K219 Gastro-esophageal reflux disease without esophagitis: Secondary | ICD-10-CM | POA: Diagnosis not present

## 2022-08-20 DIAGNOSIS — Z7901 Long term (current) use of anticoagulants: Secondary | ICD-10-CM | POA: Diagnosis not present

## 2022-08-20 DIAGNOSIS — I6529 Occlusion and stenosis of unspecified carotid artery: Secondary | ICD-10-CM | POA: Diagnosis not present

## 2022-08-20 DIAGNOSIS — Z952 Presence of prosthetic heart valve: Secondary | ICD-10-CM

## 2022-08-20 DIAGNOSIS — D6869 Other thrombophilia: Secondary | ICD-10-CM

## 2022-08-20 LAB — POCT INR: INR: 3.3 — AB (ref 2.0–3.0)

## 2022-08-20 NOTE — Progress Notes (Signed)
Careteam: Patient Care Team: [name], NP as PCP - General (Geriatric Medicine)  PLACE OF SERVICE:  [place]  Advanced Directive information    Allergies  Allergen Reactions   Aspirin     Other reaction(s): Other (See Comments) Can't take due to taking blood thinners   Morphine And Related Other (See Comments)    Other reaction(s): Other (See Comments) Hallucinations     Nitroglycerin     Severe headache   Promethazine Other (See Comments)    Other reaction(s): Other (See Comments) Hallucinations    Promethazine Hcl Other (See Comments)    Chief Complaint  Patient presents with   Medical Management of Chronic Issues    INR Check.    Immunizations    Discuss the need for Shingrix vaccine, and Covid Booster.     HPI: Patient is a 76 y.o. male for INR check.   Pt started on famotidine and questions if this has interfered with INR. He was taking famotidine for about 4 weeks and been off for ~10 days.  INR on 08/06/22 was low at 2.4 goal INR 3-3.5 Coumadin was increased to 4 mg every Friday and 3.5 mg coumadin every other day.   Lab Results  Component Value Date   INR 3.3 (A) 08/20/2022   INR 2.4 08/06/2022   INR 2.8 07/12/2022    He continues to use trazodone for sleep (using a quarter tablet) when the tinnitus gets worse he has a hard time sleeping and taking and additional quarter tablet  Review of Systems:  Review of Systems  Constitutional:  Negative for chills, fever and weight loss.  HENT:  Negative for tinnitus.   Respiratory:  Negative for cough, sputum production and shortness of breath.   Cardiovascular:  Negative for chest pain, palpitations and leg swelling.  Gastrointestinal:  Negative for abdominal pain, constipation, diarrhea and heartburn.  Genitourinary:  Negative for dysuria, frequency and urgency.  Musculoskeletal:  Negative for back pain, falls, joint pain and myalgias.  Skin: Negative.   Neurological:  Negative for  dizziness and headaches.  Psychiatric/Behavioral:  Negative for depression and memory loss. The patient does not have insomnia.     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   Right Alar Crease   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   Right Temple (in situ) (curet and 5FU)   Past Surgical History:  Procedure Laterality Date   AORTIC VALVE REPLACEMENT     mechanical   BIOPSY  03/28/2021   Procedure: BIOPSY;  Surgeon: Ronald Lobo, MD;  Location: [facility];  Service: Endoscopy;;   BLADDER SURGERY  1983   Bladder Cancer Surgery    ESOPHAGOGASTRODUODENOSCOPY (EGD) WITH PROPOFOL N/A 03/28/2021   Procedure: ESOPHAGOGASTRODUODENOSCOPY (EGD) WITH PROPOFOL;  Surgeon: Ronald Lobo, MD;  Location: [facility];  Service: Endoscopy;  Laterality: N/A;   KNEE SURGERY Right 1980   KNEE SURGERY Left 1982   OTHER SURGICAL HISTORY  1992   stomach muscles removed due to Coumadin caused bleeding.   SKIN LESION EXCISION     Some cancerous   SKIN SURGERY  01/30/2022   Basal Cell on side of nose   TONSILLECTOMY  1965   Social History:   reports that he has never smoked. His smokeless tobacco use includes snuff. He reports that he does not currently use alcohol. He reports that he does not use drugs.  Family History  Problem Relation Age of Onset   Heart disease Neg Hx      Medications: Patient's Medications  New Prescriptions   No medications on file  Previous Medications   ACETAMINOPHEN (TYLENOL) 500 MG TABLET    Take 500 mg by mouth daily as needed for moderate pain.   ALBUTEROL (VENTOLIN HFA) 108 (90 BASE) MCG/ACT INHALER    Inhale 1 puff into the lungs every 6 (six) hours as needed for wheezing or shortness of breath.   CETIRIZINE (ZYRTEC) 10 MG TABLET    Take 10 mg by mouth daily. Take 1 tablet by mouth daily for allergies.   CHOLECALCIFEROL (VITAMIN D-3 PO)    Take 1 tablet by mouth 2 (two) times daily. Dose unknown   DOCUSATE SODIUM (COLACE) 100 MG CAPSULE    Take 100 mg by mouth as needed for mild constipation.   FLUTICASONE (FLONASE) 50 MCG/ACT NASAL SPRAY    Place 1 spray into both nostrils 2 (two) times daily.   FUROSEMIDE (LASIX) 20 MG TABLET    Take 1 tablet (20 mg total) by mouth daily as needed for fluid or edema (3lb weight gain).   IRON, FERROUS SULFATE, 325 (65 FE) MG TABS    Take 325 mg by mouth daily.   LOVASTATIN (MEVACOR) 40 MG TABLET    TAKE ONE TABLET BY MOUTH AT BEDTIME   PANTOPRAZOLE (PROTONIX) 40 MG TABLET    TAKE ONE TABLET BY MOUTH TWICE A DAY   PROPYLENE GLYCOL (SYSTANE BALANCE) 0.6 % SOLN    Apply 1 drop to eye as needed (for dry eye).   RAMIPRIL (ALTACE) 10 MG CAPSULE    TAKE ONE CAPSULE BY MOUTH TWICE A DAY   TRAZODONE (DESYREL) 50 MG TABLET    Take 0.5 tablets (25 mg total) by mouth at bedtime.   VITAMIN B-12 (CYANOCOBALAMIN) 1000 MCG TABLET    Take 1,000 mcg by mouth daily.   WARFARIN (COUMADIN) 1 MG TABLET    Take by mouth as directed. Take 1/2 tablet daily with '3mg'$  to total 3.'5mg'$  except every other Friday take 1 tablet along with '3mg'$  to total '4mg'$ .   WARFARIN (COUMADIN) 3 MG TABLET    TAKE 1 TABLET BY MOUTH DAILY ALONG WITH 0.5 MG TABLET FOR TOTAL DOSE OF 3.5 MG DAILY  Modified Medications   No medications on file  Discontinued Medications   WARFARIN (COUMADIN) 1 MG TABLET    Take 1/2 tablet daily with '3mg'$  to total  3.'5mg'$  except every other Monday take 1 tablet along with '3mg'$  to total '4mg'$ .    Physical Exam:  Vitals:   08/20/22 1530  BP: 132/80  Pulse: 89  Resp: 16  Temp: (!) 97 F (36.1 C)  SpO2: 92%  Weight: 182 lb 9.6 oz (82.8 kg)  Height: '5\' 9"'$  (1.753 m)   Body mass index is 26.97 kg/m. Wt Readings from Last 3  Encounters:  08/20/22 182 lb 9.6 oz (82.8 kg)  08/04/22 178 lb (80.7 kg)  07/21/22 179 lb 9.6 oz (81.5 kg)    Physical Exam Constitutional:      General: He is not in acute distress.    Appearance: He is well-developed. He is not diaphoretic.  HENT:     Head: Normocephalic and atraumatic.     Right Ear: External ear normal.     Left Ear: External ear normal.     Mouth/Throat:     Pharynx: No oropharyngeal exudate.  Eyes:     Conjunctiva/sclera: Conjunctivae normal.     Pupils: Pupils are equal, round, and reactive to light.  Cardiovascular:     Rate and Rhythm: Normal rate and regular rhythm.     Heart sounds: Murmur heard.  Pulmonary:     Effort: Pulmonary effort is normal.     Breath sounds: Normal breath sounds.  Abdominal:     General: Bowel sounds are normal.     Palpations: Abdomen is soft.  Musculoskeletal:        General: No tenderness.     Cervical back: Normal range of motion and neck supple.     Right lower leg: No edema.     Left lower leg: No edema.  Skin:    General: Skin is warm and dry.  Neurological:     Mental Status: He is alert and oriented to person, place, and time.     Labs reviewed: Basic Metabolic Panel: Recent Labs    01/22/22 1539 03/08/22 1618 03/15/22 0830  NA 137 138 138  K 5.4* 4.5 4.7  CL 100 102 104  CO2 '23 29 29  '$ GLUCOSE 75 84 131*  BUN '18 19 14  '$ CREATININE 1.46* 1.25 1.37*  CALCIUM 9.7 8.9 9.0   Liver Function Tests: Recent Labs    10/07/21 1235 03/15/22 0830  AST 17 14  ALT 9 15  BILITOT 0.4 0.4  PROT 6.0* 5.5*   No results for input(s): "LIPASE", "AMYLASE" in the last 8760 hours. No results for  input(s): "AMMONIA" in the last 8760 hours. CBC: Recent Labs    10/07/21 1235 01/22/22 1539 03/08/22 1618  WBC 5.2 9.8 10.7  NEUTROABS 3,333  --  7,608  HGB 14.6 14.8 14.9  HCT 43.8 44.1 44.9  MCV 89.4 90 90.0  PLT 235 265 291   Lipid Panel: Recent Labs    03/15/22 0830  CHOL 188  HDL 49  LDLCALC 107*  TRIG 202*  CHOLHDL 3.8   TSH: No results for input(s): "TSH" in the last 8760 hours. A1C: No results found for: "HGBA1C"   Assessment/Plan 1. Long-term (current) use of anticoagulants, INR goal 3-3.5  - POC INR at goal 3.3 on current coumadin of 3.5 mg daily except 4 mg on fridays  2. 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 Continues on coumadin, INR goal of 3-3.5  3. H/O aortic valve replacement -stable, without shortness of breath or chest pains  4. Gastroesophageal reflux disease without esophagitis -doing better on dietary modifications and has not needed famotidine.    Return in about 2 weeks (around 09/03/2022) for INR . Carlos American. [place], [facility] [phone]

## 2022-08-23 ENCOUNTER — Ambulatory Visit: Payer: Medicare Other | Admitting: Nurse Practitioner

## 2022-08-29 ENCOUNTER — Other Ambulatory Visit: Payer: Self-pay | Admitting: Nurse Practitioner

## 2022-08-30 NOTE — Telephone Encounter (Signed)
Patient has request refill on medication Warfarin. Medication pend and sent to PCP [name], NP .

## 2022-09-06 ENCOUNTER — Encounter: Payer: Self-pay | Admitting: Nurse Practitioner

## 2022-09-06 ENCOUNTER — Ambulatory Visit (INDEPENDENT_AMBULATORY_CARE_PROVIDER_SITE_OTHER): Payer: Medicare Other | Admitting: Nurse Practitioner

## 2022-09-06 VITALS — BP 118/72 | HR 82 | Temp 97.1°F | Ht 69.0 in | Wt 182.2 lb

## 2022-09-06 DIAGNOSIS — Z952 Presence of prosthetic heart valve: Secondary | ICD-10-CM

## 2022-09-06 DIAGNOSIS — D6869 Other thrombophilia: Secondary | ICD-10-CM | POA: Diagnosis not present

## 2022-09-06 DIAGNOSIS — I6529 Occlusion and stenosis of unspecified carotid artery: Secondary | ICD-10-CM | POA: Diagnosis not present

## 2022-09-06 DIAGNOSIS — Z7901 Long term (current) use of anticoagulants: Secondary | ICD-10-CM

## 2022-09-06 LAB — POCT INR: POC INR: 3.9

## 2022-09-06 NOTE — Progress Notes (Unsigned)
Careteam: Patient Care Team: [name], NP as PCP - General (Geriatric Medicine)  PLACE OF SERVICE:  [place]  Advanced Directive information    Allergies  Allergen Reactions   Aspirin     Other reaction(s): Other (See Comments) Can't take due to taking blood thinners   Morphine And Related Other (See Comments)    Other reaction(s): Other (See Comments) Hallucinations     Nitroglycerin     Severe headache   Promethazine Other (See Comments)    Other reaction(s): Other (See Comments) Hallucinations    Promethazine Hcl Other (See Comments)    No chief complaint on file.    HPI: Patient is a 76 y.o. male for INR check INR today 3.9, he is taking 4 mg of coumadin on Friday and 3.5 mg every other day.    Lab Results  Component Value Date   INR 3.3 (A) 08/20/2022   INR 2.4 08/06/2022   INR 2.8 07/12/2022     Review of Systems:  ROS***  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[place]) 03/28/2021   Atypical mole 11/18/2021   Right Lower Back (moderate with halo nevus effect) (wider shave)   Closed compression fracture of body of L1 vertebra (HCC) 09/38/1829   Complication of anesthesia    PONV   Compression fracture of T12 vertebra (HCC) 03/28/2021   Depression    History of bladder cancer    History of pneumonia 1959   History of prostate cancer    History of scarlet fever 1956   History of stroke 2009   Hypertension    Long term current use of anticoagulant 03/22/2011   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   Right Alar Crease   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   Right Temple (in situ) (curet and 5FU)   Past Surgical History:  Procedure Laterality Date   AORTIC VALVE REPLACEMENT     mechanical   BIOPSY  03/28/2021   Procedure: BIOPSY;  Surgeon: Ronald Lobo, MD;  Location: [facility];  Service: Endoscopy;;   BLADDER SURGERY  1983   Bladder Cancer Surgery     ESOPHAGOGASTRODUODENOSCOPY (EGD) WITH PROPOFOL N/A 03/28/2021   Procedure: ESOPHAGOGASTRODUODENOSCOPY (EGD) WITH PROPOFOL;  Surgeon: Ronald Lobo, MD;  Location: [facility];  Service: Endoscopy;  Laterality: N/A;   KNEE SURGERY Right 1980   KNEE SURGERY Left 1982   OTHER SURGICAL HISTORY  1992   stomach muscles removed due to Coumadin caused bleeding.   SKIN LESION EXCISION     Some cancerous   SKIN SURGERY  01/30/2022   Basal Cell on side of nose   TONSILLECTOMY  1965   Social History:   reports that he has never smoked. His smokeless tobacco use includes snuff. He reports that he does not currently use alcohol. He reports that he does not use drugs.  Family History  Problem Relation Age of Onset   Heart disease Neg Hx     Medications: Patient's Medications  New Prescriptions   No medications on file  Previous Medications   ACETAMINOPHEN (TYLENOL) 500 MG TABLET    Take 500 mg by mouth daily as needed for moderate pain.   ALBUTEROL (VENTOLIN HFA) 108 (90 BASE) MCG/ACT INHALER    Inhale 1 puff into the lungs every 6 (six) hours as needed for wheezing or shortness of breath.   CETIRIZINE (ZYRTEC) 10 MG TABLET    Take  10 mg by mouth daily. Take 1 tablet by mouth daily for allergies.   CHOLECALCIFEROL (VITAMIN D-3 PO)    Take 1 tablet by mouth 2 (two) times daily. Dose unknown   DOCUSATE SODIUM (COLACE) 100 MG CAPSULE    Take 100 mg by mouth as needed for mild constipation.   FLUTICASONE (FLONASE) 50 MCG/ACT NASAL SPRAY    Place 1 spray into both nostrils 2 (two) times daily.   FUROSEMIDE (LASIX) 20 MG TABLET    Take 1 tablet (20 mg total) by mouth daily as needed for fluid or edema (3lb weight gain).   IRON, FERROUS SULFATE, 325 (65 FE) MG TABS    Take 325 mg by mouth daily.   LOVASTATIN (MEVACOR) 40 MG TABLET    TAKE ONE TABLET BY MOUTH AT BEDTIME   PANTOPRAZOLE (PROTONIX) 40 MG TABLET    TAKE ONE TABLET BY MOUTH TWICE A DAY   PROPYLENE GLYCOL (SYSTANE BALANCE) 0.6 % SOLN     Apply 1 drop to eye as needed (for dry eye).   RAMIPRIL (ALTACE) 10 MG CAPSULE    TAKE ONE CAPSULE BY MOUTH TWICE A DAY   TRAZODONE (DESYREL) 50 MG TABLET    Take 0.5 tablets (25 mg total) by mouth at bedtime.   VITAMIN B-12 (CYANOCOBALAMIN) 1000 MCG TABLET    Take 1,000 mcg by mouth daily.   WARFARIN (COUMADIN) 1 MG TABLET    TAKE 1/2 TABLET BY MOUTH DAILY WITH '3MG'$  TO TOTAL 3.5 MG, EXCEPT EVERY OTHER MONDAY, TAKE 1 TABLET ALONG WITH '3MG'$  TO TOTAL '4MG'$  AS DIRECTED   WARFARIN (COUMADIN) 3 MG TABLET    TAKE 1 TABLET BY MOUTH DAILY ALONG WITH 0.5 MG TABLET FOR TOTAL DOSE OF 3.5 MG DAILY  Modified Medications   No medications on file  Discontinued Medications   No medications on file    Physical Exam:  Vitals:   09/06/22 0914  BP: 118/72  Pulse: 82  Temp: (!) 97.1 F (36.2 C)  SpO2: 97%  Weight: 182 lb 3.2 oz (82.6 kg)  Height: '5\' 9"'$  (1.753 m)   Body mass index is 26.91 kg/m. Wt Readings from Last 3 Encounters:  09/06/22 182 lb 3.2 oz (82.6 kg)  08/20/22 182 lb 9.6 oz (82.8 kg)  08/04/22 178 lb (80.7 kg)    Physical Exam***  Labs reviewed: Basic Metabolic Panel: Recent Labs    01/22/22 1539 03/08/22 1618 03/15/22 0830  NA 137 138 138  K 5.4* 4.5 4.7  CL 100 102 104  CO2 '23 29 29  '$ GLUCOSE 75 84 131*  BUN '18 19 14  '$ CREATININE 1.46* 1.25 1.37*  CALCIUM 9.7 8.9 9.0   Liver Function Tests: Recent Labs    10/07/21 1235 03/15/22 0830  AST 17 14  ALT 9 15  BILITOT 0.4 0.4  PROT 6.0* 5.5*   No results for input(s): "LIPASE", "AMYLASE" in the last 8760 hours. No results for input(s): "AMMONIA" in the last 8760 hours. CBC: Recent Labs    10/07/21 1235 01/22/22 1539 03/08/22 1618  WBC 5.2 9.8 10.7  NEUTROABS 3,333  --  7,608  HGB 14.6 14.8 14.9  HCT 43.8 44.1 44.9  MCV 89.4 90 90.0  PLT 235 265 291   Lipid Panel: Recent Labs    03/15/22 0830  CHOL 188  HDL 49  LDLCALC 107*  TRIG 202*  CHOLHDL 3.8   TSH: No results for input(s): "TSH" in the last  8760 hours. A1C: No results found for: "HGBA1C"  Assessment/Plan There are no diagnoses linked to this encounter.  No follow-ups on file.: *** Ramah Langhans K. [place], [facility] [phone]

## 2022-09-06 NOTE — Patient Instructions (Signed)
Change coumadin 4 mg by mouth every other Friday, continue coumadin 3.5 mg daily on all other day- ( this Friday keep coumadin 3.5 mg)

## 2022-09-28 ENCOUNTER — Other Ambulatory Visit: Payer: Self-pay | Admitting: Nurse Practitioner

## 2022-09-28 DIAGNOSIS — Z7901 Long term (current) use of anticoagulants: Secondary | ICD-10-CM

## 2022-09-28 DIAGNOSIS — Z952 Presence of prosthetic heart valve: Secondary | ICD-10-CM

## 2022-10-04 ENCOUNTER — Telehealth: Payer: Self-pay

## 2022-10-04 ENCOUNTER — Ambulatory Visit (INDEPENDENT_AMBULATORY_CARE_PROVIDER_SITE_OTHER): Payer: Medicare Other | Admitting: Nurse Practitioner

## 2022-10-04 ENCOUNTER — Encounter: Payer: Self-pay | Admitting: Nurse Practitioner

## 2022-10-04 VITALS — BP 126/78 | HR 93 | Ht 69.0 in | Wt 182.2 lb

## 2022-10-04 DIAGNOSIS — I6529 Occlusion and stenosis of unspecified carotid artery: Secondary | ICD-10-CM

## 2022-10-04 DIAGNOSIS — D6869 Other thrombophilia: Secondary | ICD-10-CM | POA: Diagnosis not present

## 2022-10-04 DIAGNOSIS — J309 Allergic rhinitis, unspecified: Secondary | ICD-10-CM

## 2022-10-04 DIAGNOSIS — M546 Pain in thoracic spine: Secondary | ICD-10-CM | POA: Diagnosis not present

## 2022-10-04 DIAGNOSIS — Z7901 Long term (current) use of anticoagulants: Secondary | ICD-10-CM

## 2022-10-04 DIAGNOSIS — Z952 Presence of prosthetic heart valve: Secondary | ICD-10-CM

## 2022-10-04 LAB — POCT INR: POC INR: 3.9

## 2022-10-04 MED ORDER — WARFARIN SODIUM 1 MG PO TABS: ORAL_TABLET | ORAL | 6 refills | Status: DC

## 2022-10-04 NOTE — Telephone Encounter (Signed)
Patient called stating he is upset because he came here today for an appointment and was told he did not have an appointment scheduled for today. Patient states when he was last seen, Janett Billow walked with him to check out and advised the admin staff to double book him for an INR check for today.   Patient was placed on a brief hold while I consulted with [name], NP and she advised that she was behind on her scheduled this morning and we could double book patient at 1:00 pm and have him arrive at 12:45. Call was resumed and patient stated he has an [place] and will not be available at that time. Patient will be available after 1:30 pm    Please advise on what time we can double book patient this afternoon

## 2022-10-04 NOTE — Progress Notes (Unsigned)
Careteam: Patient Care Team: [name], NP as PCP - General (Geriatric Medicine)  PLACE OF SERVICE:  [place]  Advanced Directive information    Allergies  Allergen Reactions   Aspirin     Other reaction(s): Other (See Comments) Can't take due to taking blood thinners   Morphine And Related Other (See Comments)    Other reaction(s): Other (See Comments) Hallucinations     Nitroglycerin     Severe headache   Promethazine Other (See Comments)    Other reaction(s): Other (See Comments) Hallucinations    Promethazine Hcl Other (See Comments)    Chief Complaint  Patient presents with   Follow-up   Anticoagulation     HPI: Patient is a 76 y.o. male for INR check- GOAL INR 3-3.5, INR today 3.9  Lab Results  Component Value Date   INR 3.9 10/04/2022   INR 3.9 09/06/2022   INR 3.3 (A) 08/20/2022   No changes in medications over the last few weeks.  No changes in diet.   Having a lot of allergies and changing from xyzal, claritin, zyrtec  Having a lot of nasal congestion, headaches,  Itchy dry eyes- uses drops for this.  Occasionally will have runny nose.   Wants to become more active.   Review of Systems:  Review of Systems  Constitutional:  Negative for chills, fever and weight loss.  HENT:  Positive for congestion. Negative for tinnitus.   Respiratory:  Negative for cough, sputum production and shortness of breath.   Cardiovascular:  Negative for chest pain, palpitations and leg swelling.  Gastrointestinal:  Negative for abdominal pain, constipation, diarrhea and heartburn.  Genitourinary:  Negative for dysuria, frequency and urgency.  Musculoskeletal:  Positive for back pain. Negative for falls, joint pain and myalgias.  Skin: Negative.   Neurological:  Negative for dizziness and headaches.  Endo/Heme/Allergies:  Positive for environmental allergies.  Psychiatric/Behavioral:  Negative for depression and memory loss. The patient does not have  insomnia.     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   Right Alar Crease   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   Right Temple (in situ) (curet and 5FU)   Past Surgical History:  Procedure Laterality Date   AORTIC VALVE REPLACEMENT     mechanical   BIOPSY  03/28/2021   Procedure: BIOPSY;  Surgeon: Ronald Lobo, MD;  Location: [facility];  Service: Endoscopy;;   BLADDER SURGERY  1983   Bladder Cancer Surgery    ESOPHAGOGASTRODUODENOSCOPY (EGD) WITH PROPOFOL N/A 03/28/2021   Procedure: ESOPHAGOGASTRODUODENOSCOPY (EGD) WITH PROPOFOL;  Surgeon: Ronald Lobo, MD;  Location: [facility];  Service: Endoscopy;  Laterality: N/A;   KNEE SURGERY Right 1980   KNEE SURGERY Left 1982   OTHER SURGICAL HISTORY  1992   stomach muscles removed  due to Coumadin caused bleeding.   SKIN LESION EXCISION     Some cancerous   SKIN SURGERY  01/30/2022   Basal Cell on side of nose   TONSILLECTOMY  1965   Social History:   reports that he has never smoked. His smokeless tobacco use includes snuff. He reports that he does not currently use alcohol. He reports that he does not use drugs.  Family History  Problem Relation Age of Onset   Heart disease Neg Hx     Medications: Patient's Medications  New Prescriptions   No medications on file  Previous Medications   ACETAMINOPHEN  (TYLENOL) 500 MG TABLET    Take 500 mg by mouth daily as needed for moderate pain.   ALBUTEROL (VENTOLIN HFA) 108 (90 BASE) MCG/ACT INHALER    Inhale 1 puff into the lungs every 6 (six) hours as needed for wheezing or shortness of breath.   AMOXICILLIN (AMOXIL) 500 MG CAPSULE    Take 1,000 mg by mouth 2 (two) times daily.   CETIRIZINE (ZYRTEC) 10 MG TABLET    Take 10 mg by mouth daily. Take 1 tablet by mouth daily for allergies.   CHOLECALCIFEROL (VITAMIN D-3 PO)    Take 1 tablet by mouth 2 (two) times daily. Dose unknown   DOCUSATE SODIUM (COLACE) 100 MG CAPSULE    Take 100 mg by mouth as needed for mild constipation.   FAMOTIDINE (PEPCID) 20 MG TABLET    Take 20 mg by mouth daily.   FLUTICASONE (FLONASE) 50 MCG/ACT NASAL SPRAY    Place 1 spray into both nostrils 2 (two) times daily.   FUROSEMIDE (LASIX) 20 MG TABLET    Take 1 tablet (20 mg total) by mouth daily as needed for fluid or edema (3lb weight gain).   IRON, FERROUS SULFATE, 325 (65 FE) MG TABS    Take 325 mg by mouth daily.   LOVASTATIN (MEVACOR) 40 MG TABLET    TAKE ONE TABLET BY MOUTH AT BEDTIME   PANTOPRAZOLE (PROTONIX) 40 MG TABLET    TAKE ONE TABLET BY MOUTH TWICE A DAY   PROPYLENE GLYCOL (SYSTANE BALANCE) 0.6 % SOLN    Apply 1 drop to eye as needed (for dry eye).   RAMIPRIL (ALTACE) 10 MG CAPSULE    TAKE ONE CAPSULE BY MOUTH TWICE A DAY   TRAZODONE (DESYREL) 50 MG TABLET    Take 0.5 tablets (25 mg total) by mouth at bedtime.   VITAMIN B-12 (CYANOCOBALAMIN) 1000 MCG TABLET    Take 1,000 mcg by mouth daily.   WARFARIN (COUMADIN) 1 MG TABLET    TAKE 1/2 TABLET BY MOUTH DAILY WITH '3MG'$  TO TOTAL 3.5 MG, EXCEPT EVERY OTHER MONDAY, TAKE 1 TABLET ALONG WITH '3MG'$  TO TOTAL '4MG'$  AS DIRECTED   WARFARIN (COUMADIN) 3 MG TABLET    TAKE 1 TABLET BY MOUTH DAILY ALONG WITH 0.5 MILLIGRAM TABLET FOR A TOTAL DOSE OF 3.5 MILLIGRAMS DAILY  Modified Medications   No medications on file  Discontinued Medications   No medications on file    Physical  Exam:  Vitals:   10/04/22 1341  BP: 126/78  Pulse: 93  SpO2: 97%  Weight: 182 lb 3.2 oz (82.6 kg)  Height: '5\' 9"'$  (1.753 m)   Body mass index is 26.91 kg/m. Wt Readings from Last 3 Encounters:  10/04/22 182 lb 3.2 oz (82.6 kg)  09/06/22 182 lb 3.2 oz (82.6 kg)  08/20/22 182 lb 9.6 oz (82.8 kg)    Physical Exam Constitutional:  General: He is not in acute distress.    Appearance: He is well-developed. He is not diaphoretic.  HENT:     Head: Normocephalic and atraumatic.     Right Ear: External ear normal.     Left Ear: External ear normal.     Mouth/Throat:     Pharynx: No oropharyngeal exudate.  Eyes:     Conjunctiva/sclera: Conjunctivae normal.     Pupils: Pupils are equal, round, and reactive to light.  Cardiovascular:     Rate and Rhythm: Normal rate and regular rhythm.     Heart sounds: Normal heart sounds.  Pulmonary:     Effort: Pulmonary effort is normal.     Breath sounds: Normal breath sounds.  Abdominal:     General: Bowel sounds are normal.     Palpations: Abdomen is soft.  Musculoskeletal:        General: No tenderness.     Cervical back: Normal range of motion and neck supple.     Right lower leg: No edema.     Left lower leg: No edema.  Skin:    General: Skin is warm and dry.  Neurological:     Mental Status: He is alert and oriented to person, place, and time.     Labs reviewed: Basic Metabolic Panel: Recent Labs    01/22/22 1539 03/08/22 1618 03/15/22 0830  NA 137 138 138  K 5.4* 4.5 4.7  CL 100 102 104  CO2 '23 29 29  '$ GLUCOSE 75 84 131*  BUN '18 19 14  '$ CREATININE 1.46* 1.25 1.37*  CALCIUM 9.7 8.9 9.0   Liver Function Tests: Recent Labs    10/07/21 1235 03/15/22 0830  AST 17 14  ALT 9 15  BILITOT 0.4 0.4  PROT 6.0* 5.5*   No results for input(s): "LIPASE", "AMYLASE" in the last 8760 hours. No results for input(s): "AMMONIA" in the last 8760 hours. CBC: Recent Labs    10/07/21 1235 01/22/22 1539 03/08/22 1618  WBC  5.2 9.8 10.7  NEUTROABS 3,333  --  7,608  HGB 14.6 14.8 14.9  HCT 43.8 44.1 44.9  MCV 89.4 90 90.0  PLT 235 265 291   Lipid Panel: Recent Labs    03/15/22 0830  CHOL 188  HDL 49  LDLCALC 107*  TRIG 202*  CHOLHDL 3.8   TSH: No results for input(s): "TSH" in the last 8760 hours. A1C: No results found for: "HGBA1C"   Assessment/Plan 1. Long-term (current) use of anticoagulants, INR goal 3-3.5 - POC INR 3.9, will decrease coumadin to 3.5 mg daily and recheck in 4 weeks.  -no signs of bleeding or abnormal bruising.   - warfarin (COUMADIN) 1 MG tablet; TAKE 1/2 TABLET BY MOUTH DAILY WITH '3MG'$  TO TOTAL 3.5 MG  Dispense: 30 tablet; Refill: 6  2. Acute midline thoracic back pain Continues with back pain to thoracic spine that comes and goes, would like exercises to help him with strength training to help pain - Ambulatory referral to Physical Therapy  3. Allergic rhinitis, unspecified seasonality, unspecified trigger -continue OTC antihistamine -to use nettipot or saline wash daily -avoid forcefully blowing nose.   4. 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  - warfarin (COUMADIN) 1 MG tablet; TAKE 1/2 TABLET BY MOUTH DAILY WITH '3MG'$  TO TOTAL 3.5 MG  Dispense: 30 tablet; Refill: 6    Return in about 1 month (around 11/04/2022).  Carlos American. [place], [facility] [phone]

## 2022-10-04 NOTE — Patient Instructions (Signed)
Use a nasal spray twice daily Do not blow nose hard   COUMADIN AT 3.5 MG DAILY

## 2022-10-06 ENCOUNTER — Ambulatory Visit: Payer: Medicare Other | Admitting: Physician Assistant

## 2022-10-09 ENCOUNTER — Other Ambulatory Visit: Payer: Self-pay | Admitting: Nurse Practitioner

## 2022-10-14 ENCOUNTER — Other Ambulatory Visit: Payer: Self-pay | Admitting: Nurse Practitioner

## 2022-10-14 DIAGNOSIS — J309 Allergic rhinitis, unspecified: Secondary | ICD-10-CM

## 2022-10-20 NOTE — Therapy (Incomplete)
OUTPATIENT PHYSICAL THERAPY THORACOLUMBAR EVALUATION   Patient Name: Garrett Terry MRN: [id number] DOB:1946-01-20, 76 y.o., male Today's Date: 10/20/2022  END OF SESSION:   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[place]) 03/28/2021   Atypical mole 11/18/2021   Right Lower Back (moderate with halo nevus effect) (wider shave)   Closed compression fracture of body of L1 vertebra ([place]) 84/66/5993   Complication of anesthesia    PONV   Compression fracture of T12 vertebra (HCC) 03/28/2021   Depression    History of bladder cancer    History of pneumonia 1959   History of prostate cancer    History of scarlet fever 1956   History of stroke 2009   Hypertension    Long term current use of anticoagulant 03/22/2011   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   Right Alar Crease   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   Right Temple (in situ) (curet and 5FU)   Past Surgical History:  Procedure Laterality Date   AORTIC VALVE REPLACEMENT     mechanical   BIOPSY  03/28/2021   Procedure: BIOPSY;  Surgeon: Ronald Lobo, MD;  Location: [facility];  Service: Endoscopy;;   BLADDER SURGERY  1983   Bladder Cancer Surgery    ESOPHAGOGASTRODUODENOSCOPY (EGD) WITH PROPOFOL N/A 03/28/2021   Procedure: ESOPHAGOGASTRODUODENOSCOPY (EGD) WITH PROPOFOL;  Surgeon: Ronald Lobo, MD;  Location: [facility];  Service: Endoscopy;  Laterality: N/A;   KNEE SURGERY Right 1980   KNEE SURGERY Left 1982   OTHER SURGICAL HISTORY  1992   stomach muscles removed due to Coumadin caused bleeding.   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   Acquired thrombophilia ([place]) 11/30/2021   Nausea and vomiting  04/18/2021   Diverticulitis of sigmoid colon 04/18/2021   [place] grade B esophagitis 04/18/2021   Migraine headache with aura 04/18/2021   Alcohol abuse 04/18/2021   Generalized anxiety disorder 04/18/2021   Essential hypertension 04/18/2021   Shoulder pain 04/14/2021   Microcytic anemia 03/28/2021   Compression fracture of T12 vertebra ([place]) 03/28/2021   Closed compression fracture of body of L1 vertebra ([place]) 03/28/2021   Aortic atherosclerosis ([place]) 03/28/2021   H/O aortic valve replacement 03/27/2021   Alcohol dependence ([place]) 03/27/2021   Anxiety 10/27/2020   Asthma 05/09/2020   Erectile dysfunction 02/07/2020   Dysphagia 09/19/2016   History of cerebrovascular accident 07/23/2015   Allergic rhinitis 06/30/2015   Long-term (current) use of anticoagulants, INR goal 3-3.5 03/22/2011   Gastroesophageal reflux disease 07/07/2010   Mixed hyperlipidemia 07/07/2010   Heart valve disorder 07/07/2010    PCP: [name], NP   REFERRING PROVIDER: [name], NP   REFERRING DIAG: M54.6 (ICD-10-CM) - Acute midline thoracic back pain   Rationale for Evaluation and Treatment: Rehabilitation  THERAPY DIAG:  No diagnosis found.  ONSET DATE: ***  SUBJECTIVE:  SUBJECTIVE STATEMENT: ***  PERTINENT HISTORY:  Anxiety, depression, Closed compression fx T12 03/28/21, Hx of bladder and prostate ca, hx of CVA, HTN, s/p AVR, RT/LT knee surgery 1980 /1982  PAIN:  Are you having pain? Yes: NPRS scale: ***/10 Pain location: *** Pain description: *** Aggravating factors: *** Relieving factors: ***  PRECAUTIONS: {Therapy precautions:24002}  WEIGHT BEARING RESTRICTIONS: {Yes ***/No:24003}  FALLS:  Has patient fallen in last 6 months? {fallsyesno:27318}  LIVING ENVIRONMENT: Lives  with: {OPRC lives with:25569::"lives with their family"} Lives in: {Lives in:25570} Stairs: {opstairs:27293} Has following equipment at home: {Assistive devices:23999}  OCCUPATION: ***  PLOF: {PLOF:24004}  PATIENT GOALS: ***  NEXT MD VISIT:   OBJECTIVE:   DIAGNOSTIC FINDINGS:  ***  PATIENT SURVEYS:  FOTO ***  SCREENING FOR RED FLAGS: Bowel or bladder incontinence: {Yes/No:304960894} Spinal tumors: {Yes/No:304960894} Cauda equina syndrome: {Yes/No:304960894} Compression fracture: {Yes/No:304960894} Abdominal aneurysm: {Yes/No:304960894}  COGNITION: Overall cognitive status: {cognition:24006}     SENSATION: {sensation:27233}  MUSCLE LENGTH: Hamstrings: Right *** deg; Left *** deg Thomas test: Right *** deg; Left *** deg  POSTURE: {posture:25561}  PALPATION: ***  CERVICAL ROM:   {AROM/PROM:27142} ROM A/PROM (deg) eval  Flexion   Extension   Right lateral flexion   Left lateral flexion   Right rotation   Left rotation    (Blank rows = not tested)   LUMBAR ROM:   AROM eval  Flexion   Extension   Right lateral flexion   Left lateral flexion   Right rotation   Left rotation    (Blank rows = not tested)  LOWER EXTREMITY ROM:     {AROM/PROM:27142}  Right eval Left eval  Hip flexion    Hip extension    Hip abduction    Hip adduction    Hip internal rotation    Hip external rotation    Knee flexion    Knee extension    Ankle dorsiflexion    Ankle plantarflexion    Ankle inversion    Ankle eversion     (Blank rows = not tested)  LOWER EXTREMITY MMT:    MMT Right eval Left eval  Hip flexion    Hip extension    Hip abduction    Hip adduction    Hip internal rotation    Hip external rotation    Knee flexion    Knee extension    Ankle dorsiflexion    Ankle plantarflexion    Ankle inversion    Ankle eversion     (Blank rows = not tested)  LUMBAR SPECIAL TESTS:  {lumbar special test:25242}  FUNCTIONAL TESTS:  {Functional  tests:24029}  GAIT: Distance walked: *** Assistive device utilized: {Assistive devices:23999} Level of assistance: {Levels of assistance:24026} Comments: ***  TODAY'S TREATMENT:                                                                                                                              DATE: ***    PATIENT EDUCATION:  Education details: *** Person educated: {Person educated:25204} Education method: {Education Method:25205} Education comprehension: {Education Comprehension:25206}  HOME EXERCISE PROGRAM: ***  ASSESSMENT:  CLINICAL IMPRESSION: Patient is a *** y.o. *** who was seen today for physical therapy evaluation and treatment for ***.   OBJECTIVE IMPAIRMENTS: {opptimpairments:25111}.   ACTIVITY LIMITATIONS: {activitylimitations:27494}  PARTICIPATION LIMITATIONS: {participationrestrictions:25113}  PERSONAL FACTORS: {Personal factors:25162} are also affecting patient's functional outcome.   REHAB POTENTIAL: {rehabpotential:25112}  CLINICAL DECISION MAKING: {clinical decision making:25114}  EVALUATION COMPLEXITY: {Evaluation complexity:25115}   GOALS: Goals reviewed with patient? {yes/no:20286}  SHORT TERM GOALS: Target date: ***  *** Baseline: Goal status: {GOALSTATUS:25110}  2.  *** Baseline:  Goal status: {GOALSTATUS:25110}  3.  *** Baseline:  Goal status: {GOALSTATUS:25110}  4.  *** Baseline:  Goal status: {GOALSTATUS:25110}  5.  *** Baseline:  Goal status: {GOALSTATUS:25110}  6.  *** Baseline:  Goal status: {GOALSTATUS:25110}  LONG TERM GOALS: Target date: ***  *** Baseline:  Goal status: {GOALSTATUS:25110}  2.  *** Baseline:  Goal status: {GOALSTATUS:25110}  3.  *** Baseline:  Goal status: {GOALSTATUS:25110}  4.  *** Baseline:  Goal status: {GOALSTATUS:25110}  5.  *** Baseline:  Goal status: {GOALSTATUS:25110}  6.  *** Baseline:  Goal status: {GOALSTATUS:25110}  PLAN:  PT FREQUENCY: {rehab  frequency:25116}  PT DURATION: {rehab duration:25117}  PLANNED INTERVENTIONS: {rehab planned interventions:25118::"Therapeutic exercises","Therapeutic activity","Neuromuscular re-education","Balance training","Gait training","Patient/Family education","Self Care","Joint mobilization"}.  PLAN FOR NEXT SESSION: Dorothea Ogle, PT 10/20/2022, 12:53 PM

## 2022-10-21 ENCOUNTER — Ambulatory Visit: Payer: Medicare Other | Admitting: Physical Therapy

## 2022-11-02 ENCOUNTER — Encounter: Payer: Self-pay | Admitting: Nurse Practitioner

## 2022-11-02 NOTE — Therapy (Incomplete)
OUTPATIENT PHYSICAL THERAPY THORACOLUMBAR EVALUATION   Patient Name: Garrett Terry MRN: [id number] DOB:November 20, 1945, 77 y.o., male Today's Date: 11/02/2022  END OF SESSION:   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[place]) 03/28/2021   Atypical mole 11/18/2021   Right Lower Back (moderate with halo nevus effect) (wider shave)   Closed compression fracture of body of L1 vertebra ([place]) 72/53/6644   Complication of anesthesia    PONV   Compression fracture of T12 vertebra (H[facility]) 03/28/2021   Depression    History of bladder cancer    History of pneumonia 1959   History of prostate cancer    History of scarlet fever 1956   History of stroke 2009   Hypertension    Long term current use of anticoagulant 03/22/2011   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   Right Alar Crease   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   Right Temple (in situ) (curet and 5FU)   Past Surgical History:  Procedure Laterality Date   AORTIC VALVE REPLACEMENT     mechanical   BIOPSY  03/28/2021   Procedure: BIOPSY;  Surgeon: Ronald Lobo, MD;  Location: [facility];  Service: Endoscopy;;   BLADDER SURGERY  1983   Bladder Cancer Surgery    ESOPHAGOGASTRODUODENOSCOPY (EGD) WITH PROPOFOL N/A 03/28/2021   Procedure: ESOPHAGOGASTRODUODENOSCOPY (EGD) WITH PROPOFOL;  Surgeon: Ronald Lobo, MD;  Location: [facility];  Service: Endoscopy;  Laterality: N/A;   KNEE SURGERY Right 1980   KNEE SURGERY Left 1982   OTHER SURGICAL HISTORY  1992   stomach muscles removed due to Coumadin caused bleeding.   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   Acquired thrombophilia ([place]) 11/30/2021   Nausea and vomiting  04/18/2021   Diverticulitis of sigmoid colon 04/18/2021   [place] grade B esophagitis 04/18/2021   Migraine headache with aura 04/18/2021   Alcohol abuse 04/18/2021   Generalized anxiety disorder 04/18/2021   Essential hypertension 04/18/2021   Shoulder pain 04/14/2021   Microcytic anemia 03/28/2021   Compression fracture of T12 vertebra ([place]) 03/28/2021   Closed compression fracture of body of L1 vertebra ([place]) 03/28/2021   Aortic atherosclerosis ([place]) 03/28/2021   H/O aortic valve replacement 03/27/2021   Alcohol dependence ([place]) 03/27/2021   Anxiety 10/27/2020   Asthma 05/09/2020   Erectile dysfunction 02/07/2020   Dysphagia 09/19/2016   History of cerebrovascular accident 07/23/2015   Allergic rhinitis 06/30/2015   Long-term (current) use of anticoagulants, INR goal 3-3.5 03/22/2011   Gastroesophageal reflux disease 07/07/2010   Mixed hyperlipidemia 07/07/2010   Heart valve disorder 07/07/2010    PCP: [name], NP  REFERRING PROVIDER: [name], NP  REFERRING DIAG: acute midline thoracic back pain   Rationale for Evaluation and Treatment: Rehabilitation  THERAPY DIAG:  No diagnosis found.  ONSET DATE: ***  SUBJECTIVE:  SUBJECTIVE STATEMENT: ***  PERTINENT HISTORY:  ***  PAIN:  Are you having pain? Yes: NPRS scale: ***/10 Pain location: *** Pain description: *** Aggravating factors: *** Relieving factors: ***  PRECAUTIONS: {Therapy precautions:24002}  WEIGHT BEARING RESTRICTIONS: {Yes ***/No:24003}  FALLS:  Has patient fallen in last 6 months? {fallsyesno:27318}  LIVING ENVIRONMENT: Lives with: {[facility] lives with:25569::"lives with their family"} Lives in: {Lives in:25570} Stairs: {opstairs:27293} Has following equipment at home: {Assistive  devices:23999}  OCCUPATION: ***  PLOF: {PLOF:24004}  PATIENT GOALS: ***   OBJECTIVE:   DIAGNOSTIC FINDINGS:  ***  PATIENT SURVEYS:  {rehab surveys:24030}  SCREENING FOR RED FLAGS: Bowel or bladder incontinence: {Yes/No:304960894} Spinal tumors: {Yes/No:304960894} Cauda equina syndrome: {Yes/No:304960894} Compression fracture: {Yes/No:304960894} Abdominal aneurysm: {Yes/No:304960894}  COGNITION: Overall cognitive status: {cognition:24006}     SENSATION: {sensation:27233}  MUSCLE LENGTH: Hamstrings: Right *** deg; Left *** deg Thomas test: Right *** deg; Left *** deg  POSTURE: {posture:25561}  PALPATION: ***  LUMBAR ROM:   AROM eval  Flexion   Extension   Right lateral flexion   Left lateral flexion   Right rotation   Left rotation    (Blank rows = not tested)  LOWER EXTREMITY ROM:     Active  Right eval Left eval  Hip flexion    Hip extension    Hip abduction    Hip adduction    Hip internal rotation    Hip external rotation    Knee flexion    Knee extension    Ankle dorsiflexion    Ankle plantarflexion    Ankle inversion    Ankle eversion     (Blank rows = not tested)  LOWER EXTREMITY MMT:    MMT Right eval Left eval  Hip flexion    Hip extension    Hip abduction    Hip adduction    Hip internal rotation    Hip external rotation    Knee flexion    Knee extension    Ankle dorsiflexion    Ankle plantarflexion    Ankle inversion    Ankle eversion     (Blank rows = not tested)  LUMBAR SPECIAL TESTS:  ***  FUNCTIONAL TESTS:  {Functional tests:24029}  GAIT: Distance walked: *** Assistive device utilized: {Assistive devices:23999} Level of assistance: {Levels of assistance:24026} Comments: ***  [facility] Adult PT Treatment:                                                DATE: ***  Therapeutic Exercise: Demonstrated and issued initial HEP.   Manual Therapy: *** Neuromuscular re-ed: *** Therapeutic Activity: Education on  assessment findings that will be addressed throughout duration of POC.   Modalities: *** Self Care: ***    PATIENT EDUCATION:  Education details: see treatment Person educated: Patient Education method: Explanation, Demonstration, Tactile cues, Verbal cues, and Handouts Education comprehension: verbalized understanding, returned demonstration, verbal cues required, tactile cues required, and needs further education  HOME EXERCISE PROGRAM: ***  ASSESSMENT:  CLINICAL IMPRESSION: Patient is a *** y.o. *** who was seen today for physical therapy evaluation and treatment for ***.   OBJECTIVE IMPAIRMENTS: {opptimpairments:25111}.   ACTIVITY LIMITATIONS: {activitylimitations:27494}  PARTICIPATION LIMITATIONS: {participationrestrictions:25113}  PERSONAL FACTORS: {Personal factors:25162} are also affecting patient's functional outcome.   REHAB POTENTIAL: {rehabpotential:25112}  CLINICAL DECISION MAKING: {clinical decision making:25114}  EVALUATION COMPLEXITY: {Evaluation complexity:25115}   GOALS: Goals reviewed with  patient? Yes  SHORT TERM GOALS: Target date: ***  Patient will be independent and compliant with initial HEP.   Baseline: see above Goal status: INITIAL  2.  *** Baseline: see above  Goal status: INITIAL  3.  *** Baseline: see above  Goal status: INITIAL  4.  *** Baseline: see above  Goal status: INITIAL  LONG TERM GOALS: Target date: ***  *** Baseline: see above Goal status: INITIAL  2.  *** Baseline: see above Goal status: INITIAL  3.  *** Baseline: see above Goal status: INITIAL  4.  *** Baseline: see above Goal status: INITIAL  5.  *** Baseline: see above  Goal status: INITIAL  6.  *** Baseline:  Goal status: INITIAL  PLAN:  PT FREQUENCY: {rehab frequency:25116}  PT DURATION: {rehab duration:25117}  PLANNED INTERVENTIONS: {rehab planned interventions:25118::"Therapeutic exercises","Therapeutic activity","Neuromuscular  re-education","Balance training","Gait training","Patient/Family education","Self Care","Joint mobilization"}.  PLAN FOR NEXT SESSION: review and progress HEP prn; ***   Haakon Titsworth, PT, DPT, ATC 11/02/22 1:20 PM

## 2022-11-03 ENCOUNTER — Ambulatory Visit: Payer: Medicare Other | Attending: Nurse Practitioner

## 2022-11-03 ENCOUNTER — Ambulatory Visit (INDEPENDENT_AMBULATORY_CARE_PROVIDER_SITE_OTHER): Payer: Medicare Other | Admitting: Nurse Practitioner

## 2022-11-03 ENCOUNTER — Encounter: Payer: Self-pay | Admitting: Nurse Practitioner

## 2022-11-03 VITALS — BP 130/82 | HR 68 | Temp 97.9°F | Ht 69.0 in | Wt 184.0 lb

## 2022-11-03 DIAGNOSIS — E782 Mixed hyperlipidemia: Secondary | ICD-10-CM

## 2022-11-03 DIAGNOSIS — Z7901 Long term (current) use of anticoagulants: Secondary | ICD-10-CM | POA: Diagnosis not present

## 2022-11-03 DIAGNOSIS — D509 Iron deficiency anemia, unspecified: Secondary | ICD-10-CM | POA: Diagnosis not present

## 2022-11-03 DIAGNOSIS — Z952 Presence of prosthetic heart valve: Secondary | ICD-10-CM

## 2022-11-03 DIAGNOSIS — D6869 Other thrombophilia: Secondary | ICD-10-CM

## 2022-11-03 DIAGNOSIS — F5101 Primary insomnia: Secondary | ICD-10-CM

## 2022-11-03 LAB — POCT INR: INR: 2.8 (ref 2.0–3.0)

## 2022-11-03 NOTE — Progress Notes (Signed)
Careteam: Patient Care Team: [name], NP as PCP - General (Geriatric Medicine)  PLACE OF SERVICE:  [place] Directive information Does Patient Have a Medical Advance Directive?: Yes, Type of Advance Directive: [place];Living will;Out of facility DNR (pink MOST or yellow form), Pre-existing out of facility DNR order (yellow form or pink MOST form): Yellow form placed in chart (order not valid for inpatient use), Does patient want to make changes to medical advance directive?: No - Patient declined  Allergies  Allergen Reactions   Aspirin     Other reaction(s): Other (See Comments) Can't take due to taking blood thinners   Morphine And Related Other (See Comments)    Other reaction(s): Other (See Comments) Hallucinations     Nitroglycerin     Severe headache   Promethazine Other (See Comments)    Other reaction(s): Other (See Comments) Hallucinations    Promethazine Hcl Other (See Comments)    Chief Complaint  Patient presents with   Anticoagulation    4 week PT/INR check, no missed doses, no major diet changes, and no unusual bleeding.      HPI: [name] for INR check. Pt was previously on 3.5 mg coumdin daily except 4 mg every other Friday and INR remained elevated so coumadin was reduced to 3.5 mg daily, now INR 2.8. no missed doses There is no bleeding, bruising noted. No swelling, chest pains, shorntess of breath or TIA like symptoms.     Lab Results  Component Value Date   INR 2.8 11/03/2022   INR 3.9 10/04/2022   INR 3.9 09/06/2022    Able to get to sleep now.   Continues constipation, continues on iron supplement.    Review of Systems:  Review of Systems  Constitutional:  Negative for chills, fever and weight loss.  HENT:  Negative for tinnitus.   Respiratory:  Negative for cough, sputum production and shortness of breath.   Cardiovascular:  Negative for chest pain, palpitations and leg  swelling.  Gastrointestinal:  Negative for abdominal pain, constipation, diarrhea and heartburn.  Genitourinary:  Negative for dysuria, frequency and urgency.  Musculoskeletal:  Negative for back pain, falls, joint pain and myalgias.  Skin: Negative.   Neurological:  Negative for dizziness and headaches.  Psychiatric/Behavioral:  Negative for depression and memory loss. The patient does not have insomnia.     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[place]) 03/28/2021   Atypical mole 11/18/2021   Right Lower Back (moderate with halo nevus effect) (wider shave)   Closed compression fracture of body of L1 vertebra (HCC) 97/67/3419   Complication of anesthesia    PONV   Compression fracture of T12 vertebra ([facility]) 03/28/2021   Depression    History of bladder cancer    History of pneumonia 1959   History of prostate cancer    History of scarlet fever 1956   History of stroke 2009   Hypertension    Long term current use of anticoagulant 03/22/2011   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   Right Alar Crease   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   Right Temple (in situ) (curet and 5FU)   Past Surgical History:  Procedure Laterality Date   AORTIC VALVE REPLACEMENT     mechanical   BIOPSY  03/28/2021   Procedure: BIOPSY;  Surgeon: Ronald Lobo, MD;  [facility];  Service: Endoscopy;;   BLADDER SURGERY  1983   Bladder Cancer Surgery    ESOPHAGOGASTRODUODENOSCOPY (EGD) WITH PROPOFOL N/A 03/28/2021   Procedure: ESOPHAGOGASTRODUODENOSCOPY (EGD) WITH PROPOFOL;  Surgeon: Ronald Lobo, MD;  [facility];  Service: Endoscopy;  Laterality: N/A;   KNEE SURGERY Right 1980   KNEE SURGERY Left 1982   OTHER SURGICAL HISTORY  1992   stomach muscles removed due to Coumadin caused bleeding.   SKIN LESION EXCISION     Some cancerous   SKIN SURGERY  01/30/2022   Basal Cell on side of nose    TONSILLECTOMY  1965   Social History:   reports that he has never smoked. His smokeless tobacco use includes snuff. He reports that he does not currently use alcohol. He reports that he does not use drugs.  Family History  Problem Relation Age of Onset   Heart disease Neg Hx     Medications: Patient's Medications  New Prescriptions   No medications on file  Previous Medications   ACETAMINOPHEN (TYLENOL) 500 MG TABLET    Take 500 mg by mouth daily as needed for moderate pain.   ALBUTEROL (VENTOLIN HFA) 108 (90 BASE) MCG/ACT INHALER    Inhale 1 puff into the lungs every 6 (six) hours as needed for wheezing or shortness of breath.   AMOXICILLIN (AMOXIL) 500 MG CAPSULE    Take 1,000 mg by mouth 2 (two) times daily.   CETIRIZINE (ZYRTEC) 10 MG TABLET    Take 10 mg by mouth daily. Take 1 tablet by mouth daily for allergies.   CHOLECALCIFEROL (VITAMIN D-3 PO)    Take 1 tablet by mouth 2 (two) times daily. Dose unknown   DOCUSATE SODIUM (COLACE) 100 MG CAPSULE    Take 100 mg by mouth as needed for mild constipation.   FAMOTIDINE (PEPCID) 20 MG TABLET    Take 20 mg by mouth daily.   FLUTICASONE (FLONASE) 50 MCG/ACT NASAL SPRAY    PLACE 1 SPRAY INTO BOTH NOSTRILS TWO TIMES A DAY   FUROSEMIDE (LASIX) 20 MG TABLET    Take 1 tablet (20 mg total) by mouth daily as needed for fluid or edema (3lb weight gain).   IRON, FERROUS SULFATE, 325 (65 FE) MG TABS    Take 325 mg by mouth daily.   LOVASTATIN (MEVACOR) 40 MG TABLET    TAKE 1 TABLET BY MOUTH AT BEDTIME   PANTOPRAZOLE (PROTONIX) 40 MG TABLET    TAKE ONE TABLET BY MOUTH TWICE A DAY   PROPYLENE GLYCOL (SYSTANE BALANCE) 0.6 % SOLN    Apply 1 drop to eye as needed (for dry eye).   RAMIPRIL (ALTACE) 10 MG CAPSULE    TAKE ONE CAPSULE BY MOUTH TWICE A DAY   TRAZODONE (DESYREL) 50 MG TABLET    Take 0.5 tablets (25 mg total) by mouth at bedtime.   VITAMIN B-12 (CYANOCOBALAMIN) 1000 MCG TABLET    Take 1,000 mcg by mouth daily.   WARFARIN (COUMADIN) 1 MG  TABLET    TAKE 1/2 TABLET BY MOUTH DAILY WITH '3MG'$  TO TOTAL 3.5 MG   WARFARIN (COUMADIN) 3 MG TABLET    TAKE 1 TABLET BY MOUTH DAILY ALONG WITH 0.5 MILLIGRAM TABLET FOR A TOTAL DOSE OF 3.5 MILLIGRAMS DAILY  Modified Medications   No medications on file  Discontinued Medications   No medications on file    Physical Exam:  Vitals:   11/02/22 1441  BP: 130/82  Pulse: 68  Temp: 97.9 F (36.6 C)  TempSrc:  Temporal  SpO2: 99%  Weight: 184 lb (83.5 kg)  Height: '5\' 9"'$  (1.753 m)   Body mass index is 27.17 kg/m. Wt Readings from Last 3 Encounters:  11/02/22 184 lb (83.5 kg)  10/04/22 182 lb 3.2 oz (82.6 kg)  09/06/22 182 lb 3.2 oz (82.6 kg)    Physical Exam Constitutional:      General: He is not in acute distress.    Appearance: He is well-developed. He is not diaphoretic.  HENT:     Head: Normocephalic and atraumatic.     Right Ear: External ear normal.     Left Ear: External ear normal.     Mouth/Throat:     Pharynx: No oropharyngeal exudate.  Eyes:     Conjunctiva/sclera: Conjunctivae normal.     Pupils: Pupils are equal, round, and reactive to light.  Cardiovascular:     Rate and Rhythm: Normal rate and regular rhythm.     Heart sounds: Murmur heard.  Pulmonary:     Effort: Pulmonary effort is normal.     Breath sounds: Normal breath sounds.  Abdominal:     General: Bowel sounds are normal.     Palpations: Abdomen is soft.  Musculoskeletal:        General: No tenderness.     Cervical back: Normal range of motion and neck supple.     Right lower leg: No edema.     Left lower leg: No edema.  Skin:    General: Skin is warm and dry.  Neurological:     Mental Status: He is alert and oriented to person, place, and time.  Psychiatric:        Mood and Affect: Mood normal.     Labs reviewed: Basic Metabolic Panel: Recent Labs    01/22/22 1539 03/08/22 1618 03/15/22 0830  NA 137 138 138  K 5.4* 4.5 4.7  CL 100 102 104  CO2 '23 29 29  '$ GLUCOSE 75 84 131*  BUN  '18 19 14  '$ CREATININE 1.46* 1.25 1.37*  CALCIUM 9.7 8.9 9.0   Liver Function Tests: Recent Labs    03/15/22 0830  AST 14  ALT 15  BILITOT 0.4  PROT 5.5*   No results for input(s): "LIPASE", "AMYLASE" in the last 8760 hours. No results for input(s): "AMMONIA" in the last 8760 hours. CBC: Recent Labs    01/22/22 1539 03/08/22 1618  WBC 9.8 10.7  NEUTROABS  --  7,608  HGB 14.8 14.9  HCT 44.1 44.9  MCV 90 90.0  PLT 265 291   Lipid Panel: Recent Labs    03/15/22 0830  CHOL 188  HDL 49  LDLCALC 107*  TRIG 202*  CHOLHDL 3.8   TSH: No results for input(s): "TSH" in the last 8760 hours. A1C: No results found for: "HGBA1C"   Assessment/Plan 1. Long-term (current) use of anticoagulants, INR goal 3.0-3.5 INR 2.8 on coumadin 3.5 mg daily, will have him take coumadin 3.5 mg daily except 4 mg starting today every 3rd Wednesday (as increasing 4 mg daily ever other week) - POC INR  2. Iron deficiency anemia, unspecified iron deficiency anemia type Continues on iron supplements, however not has a well balance diet and may be able to stop supplement.  - CBC with Differential/Platelet  3. Mixed hyperlipidemia -continues on lovastatin with dietary modifications.  - Lipid panel - COMPLETE METABOLIC PANEL WITH GFR  4. Acquired thrombophilia ([place]) -due to aortic valve replacement, continues on coumadin - CBC with Differential/Platelet  5. H/O aortic  valve replacement -continues on coumadin.  - CBC with Differential/Platelet  6. Primary insomnia -controlled on trazodone.    Follow up in 1 month for INR Amra Shukla K. [place], [facility] [phone]

## 2022-11-04 LAB — COMPLETE METABOLIC PANEL WITH GFR
AG Ratio: 1.6 (calc) (ref 1.0–2.5)
ALT: 14 U/L (ref 9–46)
AST: 17 U/L (ref 10–35)
Albumin: 3.7 g/dL (ref 3.6–5.1)
Alkaline phosphatase (APISO): 85 U/L (ref 35–144)
BUN/Creatinine Ratio: 14 (calc) (ref 6–22)
BUN: 18 mg/dL (ref 7–25)
CO2: 22 mmol/L (ref 20–32)
Calcium: 9 mg/dL (ref 8.6–10.3)
Chloride: 104 mmol/L (ref 98–110)
Creat: 1.3 mg/dL — ABNORMAL HIGH (ref 0.70–1.28)
Globulin: 2.3 g/dL (calc) (ref 1.9–3.7)
Glucose, Bld: 99 mg/dL (ref 65–99)
Potassium: 4.5 mmol/L (ref 3.5–5.3)
Sodium: 138 mmol/L (ref 135–146)
Total Bilirubin: 0.3 mg/dL (ref 0.2–1.2)
Total Protein: 6 g/dL — ABNORMAL LOW (ref 6.1–8.1)
eGFR: 57 mL/min/{1.73_m2} — ABNORMAL LOW (ref >=60)

## 2022-11-04 LAB — CBC WITH DIFFERENTIAL/PLATELET
Absolute Monocytes: 735 cells/uL (ref 200–950)
Basophils Absolute: 60 cells/uL (ref 0–200)
Basophils Relative: 0.8 %
Eosinophils Absolute: 435 cells/uL (ref 15–500)
Eosinophils Relative: 5.8 %
HCT: 41.7 % (ref 38.5–50.0)
Hemoglobin: 14.1 g/dL (ref 13.2–17.1)
Lymphs Abs: 1088 cells/uL (ref 850–3900)
MCH: 29.4 pg (ref 27.0–33.0)
MCHC: 33.8 g/dL (ref 32.0–36.0)
MCV: 87.1 fL (ref 80.0–100.0)
MPV: 10.7 fL (ref 7.5–12.5)
Monocytes Relative: 9.8 %
Neutro Abs: 5183 cells/uL (ref 1500–7800)
Neutrophils Relative %: 69.1 %
Platelets: 269 10*3/uL (ref 140–400)
RBC: 4.79 10*6/uL (ref 4.20–5.80)
RDW: 12.8 % (ref 11.0–15.0)
Total Lymphocyte: 14.5 %
WBC: 7.5 10*3/uL (ref 3.8–10.8)

## 2022-11-04 LAB — LIPID PANEL
Cholesterol: 173 mg/dL (ref <200)
HDL: 48 mg/dL (ref >=40)
LDL Cholesterol (Calc): 98 mg/dL (calc)
Non-HDL Cholesterol (Calc): 125 mg/dL (calc) (ref <130)
Total CHOL/HDL Ratio: 3.6 (calc) (ref <5.0)
Triglycerides: 173 mg/dL — ABNORMAL HIGH (ref <150)

## 2022-11-21 ENCOUNTER — Other Ambulatory Visit: Payer: Self-pay | Admitting: Nurse Practitioner

## 2022-11-21 DIAGNOSIS — Z7901 Long term (current) use of anticoagulants: Secondary | ICD-10-CM

## 2022-11-23 ENCOUNTER — Encounter: Payer: Medicare Other | Admitting: Nurse Practitioner

## 2022-11-26 ENCOUNTER — Encounter: Payer: Medicare Other | Admitting: Nurse Practitioner

## 2022-12-02 ENCOUNTER — Encounter: Payer: Self-pay | Admitting: Nurse Practitioner

## 2022-12-02 ENCOUNTER — Ambulatory Visit: Payer: Medicare Other | Admitting: Nurse Practitioner

## 2022-12-02 ENCOUNTER — Telehealth: Payer: Self-pay

## 2022-12-02 DIAGNOSIS — Z0000 Encounter for general adult medical examination without abnormal findings: Secondary | ICD-10-CM

## 2022-12-02 DIAGNOSIS — Z1211 Encounter for screening for malignant neoplasm of colon: Secondary | ICD-10-CM | POA: Diagnosis not present

## 2022-12-02 DIAGNOSIS — Z1212 Encounter for screening for malignant neoplasm of rectum: Secondary | ICD-10-CM

## 2022-12-02 NOTE — Telephone Encounter (Signed)
[name], stellmach are scheduled for a virtual visit with your provider today.    Just as we do with appointments in the office, we must obtain your consent to participate.  Your consent will be active for this visit and any virtual visit you may have with one of our providers in the next 365 days.    If you have a MyChart account, I can also send a copy of this consent to you electronically.  All virtual visits are billed to your insurance company just like a traditional visit in the office.  As this is a virtual visit, video technology does not allow for your provider to perform a traditional examination.  This may limit your provider's ability to fully assess your condition.  If your provider identifies any concerns that need to be evaluated in person or the need to arrange testing such as labs, EKG, etc, we will make arrangements to do so.    Although advances in technology are sophisticated, we cannot ensure that it will always work on either your end or our end.  If the connection with a video visit is poor, we may have to switch to a telephone visit.  With either a video or telephone visit, we are not always able to ensure that we have a secure connection.   I need to obtain your verbal consent now.   Are you willing to proceed with your visit today?   Garrett Terry has provided verbal consent on 12/02/2022 for a virtual visit (video or telephone).   Carroll Kinds, CMA 12/02/2022  10:26 AM

## 2022-12-02 NOTE — Progress Notes (Signed)
Subjective:   Garrett Terry is a 77 y.o. male who presents for Medicare Annual/Subsequent preventive examination.  Review of Systems           Objective:    There were no vitals filed for this visit. There is no height or weight on file to calculate BMI.     12/02/2022   10:22 AM 11/02/2022    2:42 PM 08/20/2022    3:35 PM 07/09/2022    4:22 PM 03/08/2022    1:48 PM 03/08/2022    1:45 PM 12/28/2021   10:59 AM  Advanced Directives  Does Patient Have a Medical Advance Directive? Yes Yes Yes Yes Yes No Yes  Type of Paramedic of [place];Living will;Out of facility DNR (pink MOST or yellow form) [place];Living will;Out of facility DNR (pink MOST or yellow form) [place];Living will;Out of facility DNR (pink MOST or yellow form) Out of facility DNR (pink MOST or yellow form);[place];Living will Out of facility DNR (pink MOST or yellow form);Healthcare Power of [place];Living will;Out of facility DNR (pink MOST or yellow form)  Does patient want to make changes to medical advance directive? No - Patient declined No - Patient declined No - Patient declined No - Patient declined No - Patient declined  No - Patient declined  Copy of [place] in Chart? Yes - validated most recent copy scanned in chart (See row information) Yes - validated most recent copy scanned in chart (See row information) Yes - validated most recent copy scanned in chart (See row information) Yes - validated most recent copy scanned in chart (See row information) Yes - validated most recent copy scanned in chart (See row information)  No - copy requested  Would patient like information on creating a medical advance directive?      No - Patient declined   Pre-existing out of facility DNR order (yellow form or pink MOST form) Yellow form placed in chart (order not valid for inpatient use) Yellow  form placed in chart (order not valid for inpatient use)  Yellow form placed in chart (order not valid for inpatient use) Yellow form placed in chart (order not valid for inpatient use)  Yellow form placed in chart (order not valid for inpatient use)    Current Medications (verified) Outpatient Encounter Medications as of 12/02/2022  Medication Sig   acetaminophen (TYLENOL) 500 MG tablet Take 500 mg by mouth daily as needed for moderate pain.   albuterol (VENTOLIN HFA) 108 (90 Base) MCG/ACT inhaler Inhale 1 puff into the lungs every 6 (six) hours as needed for wheezing or shortness of breath.   amoxicillin (AMOXIL) 500 MG capsule Take 1,000 mg by mouth 2 (two) times daily. Prior to dental procedure   cetirizine (ZYRTEC) 10 MG tablet Take 10 mg by mouth daily. Take 1 tablet by mouth daily for allergies.   Cholecalciferol (VITAMIN D-3 PO) Take 1 tablet by mouth 2 (two) times daily. Dose unknown   docusate sodium (COLACE) 100 MG capsule Take 100 mg by mouth as needed for mild constipation.   famotidine (PEPCID) 20 MG tablet Take 20 mg by mouth daily.   fluticasone (FLONASE) 50 MCG/ACT nasal spray PLACE 1 SPRAY INTO BOTH NOSTRILS TWO TIMES A DAY   furosemide (LASIX) 20 MG tablet Take 1 tablet (20 mg total) by mouth daily as needed for fluid or edema (3lb weight gain).   lovastatin (MEVACOR)  40 MG tablet TAKE 1 TABLET BY MOUTH AT BEDTIME   pantoprazole (PROTONIX) 40 MG tablet TAKE ONE TABLET BY MOUTH TWICE A DAY   Propylene Glycol (SYSTANE BALANCE) 0.6 % SOLN Apply 1 drop to eye as needed (for dry eye).   ramipril (ALTACE) 10 MG capsule TAKE ONE CAPSULE BY MOUTH TWICE A DAY   traZODone (DESYREL) 50 MG tablet Take 0.5 tablets (25 mg total) by mouth at bedtime. (Patient taking differently: Take 12.5 mg by mouth at bedtime.)   vitamin B-12 (CYANOCOBALAMIN) 1000 MCG tablet Take 1,000 mcg by mouth daily.   warfarin (COUMADIN) 1 MG tablet TAKE 1/2 TABLET BY MOUTH DAILY WITH '3MG'$  TO TOTAL 3.5 MG   warfarin  (COUMADIN) 3 MG tablet TAKE 1 TABLET BY MOUTH DAILY ALONG WITH 0.5 MILLIGRAM TABLET FOR A TOTAL DOSE OF 3.5 MILLIGRAMS DAILY   [DISCONTINUED] warfarin (COUMADIN) 4 MG tablet TAKE ONE TABLET BY MOUTH DAILY   No facility-administered encounter medications on file as of 12/02/2022.    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   Atypical mole 11/18/2021   Right Lower Back (moderate with halo nevus effect) (wider shave)   Closed compression fracture of body of L1 vertebra (HCC) 38/08/1750   Complication of anesthesia    PONV   Compression fracture of T12 vertebra (HCC) 03/28/2021   Depression    History of bladder cancer    History of pneumonia 1959   History of prostate cancer    History of scarlet fever 1956   History of stroke 2009   Hypertension    Long term current use of anticoagulant 03/22/2011   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   Right Alar Crease   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   Right [place] (in situ) (curet and 5FU)   Past Surgical History:  Procedure Laterality Date   AORTIC VALVE REPLACEMENT     mechanical   BIOPSY  03/28/2021   Procedure: BIOPSY;  Surgeon: Ronald Lobo, MD;  Location: [facility];  Service: Endoscopy;;   BLADDER SURGERY  1983   Bladder Cancer Surgery    ESOPHAGOGASTRODUODENOSCOPY (EGD) WITH PROPOFOL N/A 03/28/2021   Procedure: ESOPHAGOGASTRODUODENOSCOPY (EGD) WITH PROPOFOL;  Surgeon: Ronald Lobo, MD;  Location: [facility];  Service: Endoscopy;  Laterality: N/A;   KNEE SURGERY Right 1980   KNEE SURGERY Left 1982   OTHER SURGICAL HISTORY  1992   stomach muscles removed due to Coumadin caused bleeding.   SKIN LESION EXCISION     Some cancerous   SKIN SURGERY  01/30/2022   Basal Cell on side of nose   TONSILLECTOMY  1965    Family History  Problem Relation Age of Onset   Heart disease Neg Hx    Social History   Socioeconomic History   Marital status: Widowed    Spouse name: Not on file   Number of children: Not on file   Years of education: Not on file   Highest education level: Not on file  Occupational History   Not on file  Tobacco Use   Smoking status: Never   Smokeless tobacco: Current    Types: Snuff   Tobacco comments:    2-3 pouches a day  Vaping Use   Vaping Use: Never used  Substance and Sexual Activity   Alcohol use: Not Currently   Drug  use: Never   Sexual activity: Not on file  Other Topics Concern   Not on file  Social History Narrative   Tobacco use, amount per day now: Dip 4 plugs per day.   Past tobacco use, amount per day:   How many years did you use tobacco: 40   Alcohol use (drinks per week): 15-25 drinks.   Diet: poor   Do you drink/eat things with caffeine: Yes   Marital status: Widowed.                                 What year were you married? 1973   Do you live in a house, apartment, assisted living, condo, trailer, etc.? House   Is it one or more stories? One   How many persons live in your home? Three   Do you have pets in your home?( please list) No   Highest Level of education completed? PhD Candidate.   Current or past profession: Tourist information centre manager, Principle, and Leisure centre manager.   Do you exercise?  No                                Type and how often?   Do you have a living will? Yes   Do you have a DNR form? Yes                                  If not, do you want to discuss one?   Do you have signed POA/HPOA forms?  Yes                      If so, please bring to you appointment      Do you have any difficulty bathing or dressing yourself?    Do you have any difficulty preparing food or eating?   Do you have any difficulty managing your medications?   Do you have any difficulty managing your finances?   Do you have any difficulty affording your medications?     Social Determinants of Health   Financial Resource Strain: Not on file  Food Insecurity: Not on file  Transportation Needs: Not on file  Physical Activity: Not on file  Stress: Not on file  Social Connections: Not on file    Tobacco Counseling Ready to quit: Not Answered Counseling given: Not Answered Tobacco comments: 2-3 pouches a day   Clinical Intake:                 Diabetic?no         Activities of Daily Living     No data to display          Patient Care Team: [name], NP as PCP - General (Geriatric Medicine)  Indicate any recent Medical Services you may have received from other than [facility] providers in the past year (date may be approximate).     Assessment:   This is a routine wellness examination for Garrett Terry.  Hearing/Vision screen Hearing Screening - Comments:: Patient wears hearing aids Vision Screening - Comments:: Patient wears glasses. Patient has had an eye exam wihtin the pat year. Patient sees Dr. Katy Fitch  Dietary issues and exercise activities discussed:     Goals Addressed   None   Depression Screen    11/30/2021  9:11 AM 11/19/2021   10:10 AM  PHQ 2/9 Scores  PHQ - 2 Score 5   PHQ- 9 Score 9   Exception Documentation  Other- indicate reason in comment box  Not completed  Pt. was seeing someone,but didn't work out and looking for someone else.    Fall Risk    12/02/2022   10:21 AM 11/03/2022   10:45 AM 08/20/2022    3:35 PM 03/24/2022    8:23 AM 03/08/2022    3:51 PM  Fall Risk   Falls in the past year? 0 0 0 0 1  Number falls in past yr: 0 0 0 0 0  Injury with Fall? 0 0 0 0 0  Risk for fall due to : No Fall Risks No Fall Risks No Fall Risks No Fall Risks Other (Comment)  Follow up Falls evaluation completed Falls evaluation completed Falls evaluation completed Falls evaluation completed Falls evaluation completed    [place]:  Any stairs in or around the home? No  If  so, are there any without handrails?  na Home free of loose throw rugs in walkways, pet beds, electrical cords, etc? Yes  Adequate lighting in your home to reduce risk of falls? Yes   ASSISTIVE DEVICES UTILIZED TO PREVENT FALLS:  Life alert? No  Use of a cane, walker or w/c? No  Grab bars in the bathroom? Yes  Shower chair or bench in shower? Yes  Elevated toilet seat or a handicapped toilet? Yes   TIMED UP AND GO:  Was the test performed? No .    Cognitive Function:        12/02/2022   10:22 AM 11/19/2021   10:14 AM  6CIT Screen  What Year? 0 points 0 points  What month? 0 points 0 points  What time? 0 points 0 points  Count back from 20 0 points 0 points  Months in reverse 0 points 0 points  Repeat phrase 2 points 4 points  Total Score 2 points 4 points    Immunizations Immunization History  Administered Date(s) Administered   Fluad Quad(high Dose 65+) 07/01/2021, 07/12/2022   Influenza Inj Mdck Quad Pf 07/08/2016   Influenza Split 08/06/2011, 08/14/2012, 07/20/2013, 07/19/2014   Influenza, High Dose Seasonal PF 07/26/2017, 07/23/2019   Influenza, Seasonal, Injecte, Preservative Fre 07/28/2015   Influenza,inj,Quad PF,6+ Mos 08/21/2018, 08/11/2020   PFIZER(Purple Top)SARS-COV-2 Vaccination 11/01/2019, 11/20/2019, 09/24/2020   Pneumococcal Conjugate-13 07/19/2014   Pneumococcal Polysaccharide-23 07/28/2015   Respiratory Syncytial Virus Vaccine,Recomb Aduvanted(Arexvy) 07/19/2022   Tdap 03/20/2020    TDAP status: Up to date  Flu Vaccine status: Up to date  Pneumococcal vaccine status: Up to date  Covid-19 vaccine status: Information provided on how to obtain vaccines.   Qualifies for Shingles Vaccine? Yes   Zostavax completed No   Shingrix Completed?: Yes  Screening Tests Health Maintenance  Topic Date Due   Zoster Vaccines- Shingrix (1 of 2) Never done   Fecal DNA (Cologuard)  Never done   COVID-19 Vaccine (4 - 2023-24 season) 07/02/2022   Medicare  Annual Wellness (AWV)  12/03/2023   DTaP/Tdap/Td (2 - Td or Tdap) 03/20/2030   Pneumonia Vaccine 49+ Years old  Completed   INFLUENZA VACCINE  Completed   Hepatitis C Screening  Completed   HPV VACCINES  Aged Out    Health Maintenance  Health Maintenance Due  Topic Date Due   Zoster Vaccines- Shingrix (1 of 2) Never done   Fecal DNA (Cologuard)  Never done   COVID-19 Vaccine (4 - 2023-24 season) 07/02/2022    Colorectal cancer screening: Type of screening: Cologuard. Completed due at this time. Repeat every 3 years  Lung Cancer Screening: (Low Dose CT Chest recommended if Age 79-80 years, 30 pack-year currently smoking OR have quit w/in 15years.) does not qualify.   Lung Cancer Screening Referral: na  Additional Screening:  Hepatitis C Screening: does qualify; Completed 03/2022  Vision Screening: Recommended annual ophthalmology exams for early detection of glaucoma and other disorders of the eye. Is the patient up to date with their annual eye exam?  Yes  Who is the provider or what is the name of the office in which the patient attends annual eye exams? [facility] If pt is not established with a provider, would they like to be referred to a provider to establish care? No .   Dental Screening: Recommended annual dental exams for proper oral hygiene  Community Resource Referral / Chronic Care Management: CRR required this visit?  No   CCM required this visit?  No      Plan:     I have personally reviewed and noted the following in the patient's chart:   Medical and social history Use of alcohol, tobacco or illicit drugs  Current medications and supplements including opioid prescriptions. Patient is not currently taking opioid prescriptions. Functional ability and status Nutritional status Physical activity Advanced directives List of other physicians Hospitalizations, surgeries, and ER visits in previous 12 months Vitals Screenings to include cognitive, depression,  and falls Referrals and appointments  In addition, I have reviewed and discussed with patient certain preventive protocols, quality metrics, and best practice recommendations. A written personalized care plan for preventive services as well as general preventive health recommendations were provided to patient.     [name], NP   12/02/2022    Virtual Visit via video mychart  Note  I connected with patient 12/02/22 at 10:40 AM EST by video and verified that I am speaking with the correct person using two identifiers.  Location: Patient: home Provider: [facility]    I discussed the limitations, risks, security and privacy concerns of performing an evaluation and management service by telephone and the availability of in person appointments. I also discussed with the patient that there may be a patient responsible charge related to this service. The patient expressed understanding and agreed to proceed.   I discussed the assessment and treatment plan with the patient. The patient was provided an opportunity to ask questions and all were answered. The patient agreed with the plan and demonstrated an understanding of the instructions.   The patient was advised to call back or seek an in-person evaluation if the symptoms worsen or if the condition fails to improve as anticipated.  I provided 15 minutes of non-face-to-face time during this encounter.  [employer]. Harle Battiest Avs printed and mailed

## 2022-12-02 NOTE — Patient Instructions (Signed)
Garrett Terry , Thank you for taking time to come for your Medicare Wellness Visit. I appreciate your ongoing commitment to your health goals. Please review the following plan we discussed and let me know if I can assist you in the future.   Screening recommendations/referrals: - order placed for cologuard  Recommended yearly ophthalmology/optometry visit for glaucoma screening and checkup Recommended yearly dental visit for hygiene and checkup  Vaccinations: Influenza vaccine due annually in September/October Pneumococcal vaccine up to date Tdap vaccine up to date Shingles vaccine -please send [place] the dates for your shingles vaccine    Advanced directives: on file.   Conditions/risks identified: advanced age, hx of stroke  Next appointment: yearly   Preventive Care 46 Years and Older, Male Preventive care refers to lifestyle choices and visits with your health care provider that can promote health and wellness. What does preventive care include? A yearly physical exam. This is also called an annual well check. Dental exams once or twice a year. Routine eye exams. Ask your health care provider how often you should have your eyes checked. Personal lifestyle choices, including: Daily care of your teeth and gums. Regular physical activity. Eating a healthy diet. Avoiding tobacco and drug use. Limiting alcohol use. Practicing safe sex. Taking low doses of aspirin every day. Taking vitamin and mineral supplements as recommended by your health care provider. What happens during an annual well check? The services and screenings done by your health care provider during your annual well check will depend on your age, overall health, lifestyle risk factors, and family history of disease. Counseling  Your health care provider may ask you questions about your: Alcohol use. Tobacco use. Drug use. Emotional well-being. Home and relationship well-being. Sexual activity. Eating  habits. History of falls. Memory and ability to understand (cognition). Work and work Statistician. Screening  You may have the following tests or measurements: Height, weight, and BMI. Blood pressure. Lipid and cholesterol levels. These may be checked every 5 years, or more frequently if you are over 48 years old. Skin check. Lung cancer screening. You may have this screening every year starting at age 13 if you have a 30-pack-year history of smoking and currently smoke or have quit within the past 15 years. Fecal occult blood test (FOBT) of the stool. You may have this test every year starting at age 10. Flexible sigmoidoscopy or colonoscopy. You may have a sigmoidoscopy every 5 years or a colonoscopy every 10 years starting at age 62. Prostate cancer screening. Recommendations will vary depending on your family history and other risks. Hepatitis C blood test. Hepatitis B blood test. Sexually transmitted disease (STD) testing. Diabetes screening. This is done by checking your blood sugar (glucose) after you have not eaten for a while (fasting). You may have this done every 1-3 years. Abdominal aortic aneurysm (AAA) screening. You may need this if you are a current or former smoker. Osteoporosis. You may be screened starting at age 64 if you are at high risk. Talk with your health care provider about your test results, treatment options, and if necessary, the need for more tests. Vaccines  Your health care provider may recommend certain vaccines, such as: Influenza vaccine. This is recommended every year. Tetanus, diphtheria, and acellular pertussis (Tdap, Td) vaccine. You may need a Td booster every 10 years. Zoster vaccine. You may need this after age 2. Pneumococcal 13-valent conjugate (PCV13) vaccine. One dose is recommended after age 60. Pneumococcal polysaccharide (PPSV23) vaccine. One dose is recommended after  age 64. Talk to your health care provider about which screenings and  vaccines you need and how often you need them. This information is not intended to replace advice given to you by your health care provider. Make sure you discuss any questions you have with your health care provider. Document Released: 11/14/2015 Document Revised: 07/07/2016 Document Reviewed: 08/19/2015 Elsevier Interactive Patient Education  2017 [place] Prevention in the Home Falls can cause injuries. They can happen to people of all ages. There are many things you can do to make your home safe and to help prevent falls. What can I do on the outside of my home? Regularly fix the edges of walkways and driveways and fix any cracks. Remove anything that might make you trip as you walk through a door, such as a raised step or threshold. Trim any bushes or trees on the path to your home. Use bright outdoor lighting. Clear any walking paths of anything that might make someone trip, such as rocks or tools. Regularly check to see if handrails are loose or broken. Make sure that both sides of any steps have handrails. Any raised decks and porches should have guardrails on the edges. Have any leaves, snow, or ice cleared regularly. Use sand or salt on walking paths during winter. Clean up any spills in your garage right away. This includes oil or grease spills. What can I do in the bathroom? Use night lights. Install grab bars by the toilet and in the tub and shower. Do not use towel bars as grab bars. Use non-skid mats or decals in the tub or shower. If you need to sit down in the shower, use a plastic, non-slip stool. Keep the floor dry. Clean up any water that spills on the floor as soon as it happens. Remove soap buildup in the tub or shower regularly. Attach bath mats securely with double-sided non-slip rug tape. Do not have throw rugs and other things on the floor that can make you trip. What can I do in the bedroom? Use night lights. Make sure that you have a light by your  bed that is easy to reach. Do not use any sheets or blankets that are too big for your bed. They should not hang down onto the floor. Have a firm chair that has side arms. You can use this for support while you get dressed. Do not have throw rugs and other things on the floor that can make you trip. What can I do in the kitchen? Clean up any spills right away. Avoid walking on wet floors. Keep items that you use a lot in easy-to-reach places. If you need to reach something above you, use a strong step stool that has a grab bar. Keep electrical cords out of the way. Do not use floor polish or wax that makes floors slippery. If you must use wax, use non-skid floor wax. Do not have throw rugs and other things on the floor that can make you trip. What can I do with my stairs? Do not leave any items on the stairs. Make sure that there are handrails on both sides of the stairs and use them. Fix handrails that are broken or loose. Make sure that handrails are as long as the stairways. Check any carpeting to make sure that it is firmly attached to the stairs. Fix any carpet that is loose or worn. Avoid having throw rugs at the top or bottom of the stairs. If you do  have throw rugs, attach them to the floor with carpet tape. Make sure that you have a light switch at the top of the stairs and the bottom of the stairs. If you do not have them, ask someone to add them for you. What else can I do to help prevent falls? Wear shoes that: Do not have high heels. Have rubber bottoms. Are comfortable and fit you well. Are closed at the toe. Do not wear sandals. If you use a stepladder: Make sure that it is fully opened. Do not climb a closed stepladder. Make sure that both sides of the stepladder are locked into place. Ask someone to hold it for you, if possible. Clearly mark and make sure that you can see: Any grab bars or handrails. First and last steps. Where the edge of each step is. Use tools that  help you move around (mobility aids) if they are needed. These include: Canes. Walkers. Scooters. Crutches. Turn on the lights when you go into a dark area. Replace any light bulbs as soon as they burn out. Set up your furniture so you have a clear path. Avoid moving your furniture around. If any of your floors are uneven, fix them. If there are any pets around you, be aware of where they are. Review your medicines with your doctor. Some medicines can make you feel dizzy. This can increase your chance of falling. Ask your doctor what other things that you can do to help prevent falls. This information is not intended to replace advice given to you by your health care provider. Make sure you discuss any questions you have with your health care provider. Document Released: 08/14/2009 Document Revised: 03/25/2016 Document Reviewed: 11/22/2014 Elsevier Interactive Patient Education  2017 [employer].

## 2022-12-02 NOTE — Progress Notes (Signed)
This service is provided via telemedicine  No vital signs collected/recorded due to the encounter was a telemedicine visit.   Location of patient (ex: home, work):  Home  Patient consents to a telephone visit:  Yes, see encounter dated 12/02/2022  Location of the provider (ex: office, home):  [place]  Name of any referring provider:  N/A  Names of all persons participating in the telemedicine service and their role in the encounter:  Sherrie Mustache, Nurse Practitioner, Carroll Kinds, CMA, and patient.   Time spent on call:  10 minutes with medical assistant

## 2022-12-06 ENCOUNTER — Ambulatory Visit (INDEPENDENT_AMBULATORY_CARE_PROVIDER_SITE_OTHER): Payer: Medicare Other | Admitting: Nurse Practitioner

## 2022-12-06 ENCOUNTER — Encounter: Payer: Self-pay | Admitting: Nurse Practitioner

## 2022-12-06 VITALS — BP 118/70 | HR 90 | Temp 97.9°F | Ht 69.0 in | Wt 183.0 lb

## 2022-12-06 DIAGNOSIS — D6869 Other thrombophilia: Secondary | ICD-10-CM

## 2022-12-06 DIAGNOSIS — Z952 Presence of prosthetic heart valve: Secondary | ICD-10-CM | POA: Diagnosis not present

## 2022-12-06 DIAGNOSIS — Z7901 Long term (current) use of anticoagulants: Secondary | ICD-10-CM

## 2022-12-06 LAB — POCT INR: INR: 3.5 — AB (ref 2.0–3.0)

## 2022-12-06 NOTE — Progress Notes (Unsigned)
Careteam: Patient Care Team: [name], NP as PCP - General (Geriatric Medicine)  PLACE OF SERVICE:  [place]  Advanced Directive information    Allergies  Allergen Reactions   Aspirin     Other reaction(s): Other (See Comments) Can't take due to taking blood thinners   Morphine And Related Other (See Comments)    Other reaction(s): Other (See Comments) Hallucinations     Nitroglycerin     Severe headache   Promethazine Other (See Comments)    Other reaction(s): Other (See Comments) Hallucinations    Promethazine Hcl Other (See Comments)    Chief Complaint  Patient presents with   Anticoagulation    PT/INR check. No missed dosed, no diet changes, and no unusual bleeding.  Patient take 4 mg every 3rd Wednesday (FYI)      HPI: [name] for INR check. Goal INR 3-3.5 Last INR 2.8 therefore coumadin increase from 3.5 daily to 3.5 mg daily except 4 mg every 3rd Wednesday (every other week INR was too high).  INR today is 3.5, no missed doses, no new medications.  No bleeding or bruising.    Review of Systems:  Review of Systems  Constitutional:  Negative for chills, fever and weight loss.  HENT:  Negative for tinnitus.   Respiratory:  Negative for cough, sputum production and shortness of breath.   Cardiovascular:  Negative for chest pain, palpitations and leg swelling.  Gastrointestinal:  Negative for abdominal pain, constipation, diarrhea and heartburn.  Genitourinary:  Negative for dysuria, frequency and urgency.  Musculoskeletal:  Negative for back pain, falls, joint pain and myalgias.  Skin: Negative.   Neurological:  Negative for dizziness and headaches.  Psychiatric/Behavioral:  Negative for depression and memory loss. The patient does not have insomnia.     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   Right Alar Crease   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   Right Temple (in situ) (curet and 5FU)   Past Surgical History:  Procedure Laterality Date   AORTIC VALVE REPLACEMENT     mechanical   BIOPSY  03/28/2021   Procedure: BIOPSY;  Surgeon: Ronald Lobo, MD;  Location: [facility];  Service: Endoscopy;;   BLADDER SURGERY  1983   Bladder Cancer Surgery    ESOPHAGOGASTRODUODENOSCOPY (EGD) WITH PROPOFOL N/A 03/28/2021   Procedure: ESOPHAGOGASTRODUODENOSCOPY (EGD) WITH PROPOFOL;  Surgeon: Ronald Lobo, MD;  Location: [facility];  Service: Endoscopy;  Laterality: N/A;   KNEE SURGERY Right 1980   KNEE SURGERY Left 1982   OTHER SURGICAL HISTORY  1992   stomach muscles removed due to Coumadin caused bleeding.   SKIN LESION EXCISION     Some cancerous   SKIN SURGERY  01/30/2022   Basal Cell on side of nose  TONSILLECTOMY  1965   Social History:   reports that he has never smoked. His smokeless tobacco use includes snuff. He reports that he does not currently use alcohol. He reports that he does not use drugs.  Family History  Problem Relation Age of Onset   Heart disease Neg Hx     Medications: Patient's Medications  New Prescriptions   No medications on file  Previous Medications   ACETAMINOPHEN (TYLENOL) 500 MG TABLET    Take 500 mg by mouth daily as needed for moderate pain.   ALBUTEROL (VENTOLIN HFA) 108 (90 BASE) MCG/ACT INHALER    Inhale 1 puff into the lungs every 6 (six)  hours as needed for wheezing or shortness of breath.   AMOXICILLIN (AMOXIL) 500 MG CAPSULE    Take 1,000 mg by mouth 2 (two) times daily. Prior to dental procedure   CETIRIZINE (ZYRTEC) 10 MG TABLET    Take 10 mg by mouth daily. Take 1 tablet by mouth daily for allergies.   CHOLECALCIFEROL (VITAMIN D-3 PO)    Take 1 tablet by mouth 2 (two) times daily. Dose unknown   DOCUSATE SODIUM (COLACE) 100 MG CAPSULE    Take 100 mg by mouth as needed for mild constipation.   FAMOTIDINE (PEPCID) 20 MG TABLET    Take 20 mg by mouth daily.   FLUTICASONE (FLONASE) 50 MCG/ACT NASAL SPRAY    PLACE 1 SPRAY INTO BOTH NOSTRILS TWO TIMES A DAY   FUROSEMIDE (LASIX) 20 MG TABLET    Take 1 tablet (20 mg total) by mouth daily as needed for fluid or edema (3lb weight gain).   LOVASTATIN (MEVACOR) 40 MG TABLET    TAKE 1 TABLET BY MOUTH AT BEDTIME   PANTOPRAZOLE (PROTONIX) 40 MG TABLET    TAKE ONE TABLET BY MOUTH TWICE A DAY   PROPYLENE GLYCOL (SYSTANE BALANCE) 0.6 % SOLN    Apply 1 drop to eye as needed (for dry eye).   RAMIPRIL (ALTACE) 10 MG CAPSULE    TAKE ONE CAPSULE BY MOUTH TWICE A DAY   TRAZODONE (DESYREL) 50 MG TABLET    Take 0.5 tablets (25 mg total) by mouth at bedtime.   VITAMIN B-12 (CYANOCOBALAMIN) 1000 MCG TABLET    Take 1,000 mcg by mouth daily.   WARFARIN (COUMADIN) 1 MG TABLET    TAKE 1/2 TABLET BY MOUTH DAILY WITH '3MG'$  TO TOTAL 3.5 MG   WARFARIN (COUMADIN) 3 MG TABLET    TAKE 1 TABLET BY MOUTH DAILY ALONG WITH 0.5 MILLIGRAM TABLET FOR A TOTAL DOSE OF 3.5 MILLIGRAMS DAILY  Modified Medications   No medications on file  Discontinued Medications   No medications on file    Physical Exam:  Vitals:   12/06/22 1128  BP: 118/70  Pulse: 90  Temp: 97.9 F (36.6 C)  TempSrc: Temporal  SpO2: 98%  Weight: 183 lb (83 kg)  Height: '5\' 9"'$  (1.753 m)   Body mass index is 27.02 kg/m. Wt Readings from Last 3 Encounters:  12/06/22 183 lb (83 kg)  11/02/22 184 lb (83.5 kg)  10/04/22 182 lb 3.2 oz (82.6 kg)     Physical Exam Constitutional:      General: He is not in acute distress.    Appearance: He is well-developed. He is not diaphoretic.  HENT:     Head: Normocephalic and atraumatic.     Right Ear: External ear normal.     Left Ear: External ear normal.     Mouth/Throat:  Pharynx: No oropharyngeal exudate.  Eyes:     Conjunctiva/sclera: Conjunctivae normal.     Pupils: Pupils are equal, round, and reactive to light.  Cardiovascular:     Rate and Rhythm: Normal rate and regular rhythm.     Heart sounds: Murmur heard.  Pulmonary:     Effort: Pulmonary effort is normal.     Breath sounds: Normal breath sounds.  Abdominal:     General: Bowel sounds are normal.     Palpations: Abdomen is soft.  Musculoskeletal:        General: No tenderness.     Cervical back: Normal range of motion and neck supple.     Right lower leg: No edema.     Left lower leg: No edema.  Skin:    General: Skin is warm and dry.  Neurological:     Mental Status: He is alert and oriented to person, place, and time.     Labs reviewed: Basic Metabolic Panel: Recent Labs    03/08/22 1618 03/15/22 0830 11/03/22 1121  NA 138 138 138  K 4.5 4.7 4.5  CL 102 104 104  CO2 '29 29 22  '$ GLUCOSE 84 131* 99  BUN '19 14 18  '$ CREATININE 1.25 1.37* 1.30*  CALCIUM 8.9 9.0 9.0   Liver Function Tests: Recent Labs    03/15/22 0830 11/03/22 1121  AST 14 17  ALT 15 14  BILITOT 0.4 0.3  PROT 5.5* 6.0*   No results for input(s): "LIPASE", "AMYLASE" in the last 8760 hours. No results for input(s): "AMMONIA" in the last 8760 hours. CBC: Recent Labs    01/22/22 1539 03/08/22 1618 11/03/22 1121  WBC 9.8 10.7 7.5  NEUTROABS  --  7,608 5,183  HGB 14.8 14.9 14.1  HCT 44.1 44.9 41.7  MCV 90 90.0 87.1  PLT 265 291 269   Lipid Panel: Recent Labs    03/15/22 0830 11/03/22 1121  CHOL 188 173  HDL 49 48  LDLCALC 107* 98  TRIG 202* 173*  CHOLHDL 3.8 3.6   TSH: No results for input(s): "TSH" in the last  8760 hours. A1C: No results found for: "HGBA1C"   Assessment/Plan 1. Anticoagulant long-term use -goal 3-3.5,  - POC INR today at 3.5 will continue current dose of coumadin 3.5 daily with 4 mg every 3rd Wednesday.   2. 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 H/O aortic valve replacement Continues on coumadin, no shortness of breath, chest pains or LE edema.     Return in about 4 weeks (around 01/03/2023) for INR and routine follow up.:  Andras Grunewald K. [place], [facility] [phone]

## 2022-12-13 NOTE — Therapy (Addendum)
OUTPATIENT PHYSICAL THERAPY THORACOLUMBAR EVALUATION/DC SUMMARY   Patient Name: Garrett Terry MRN: [id number] DOB:[birth year], 77 y.o., male Today's Date: 12/13/2022  END OF SESSION: PHYSICAL THERAPY DISCHARGE SUMMARY  Visits from Start of Care: 1  Current functional level related to goals / functional outcomes: No change   Remaining deficits: No change   Education / Equipment: HEP   Patient agrees to discharge. Patient goals were not met. Patient is being discharged due to not returning since the last visit.   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   Right Alar Crease   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   Right Temple (in situ) (curet and 5FU)   Past Surgical History:  Procedure Laterality Date   AORTIC VALVE REPLACEMENT     mechanical   BIOPSY  03/28/2021   Procedure: BIOPSY;  Surgeon: Ronald Lobo, MD;  [facility]: [facility];  Service: Endoscopy;;   BLADDER SURGERY  1983   Bladder Cancer Surgery    ESOPHAGOGASTRODUODENOSCOPY (EGD) WITH PROPOFOL N/A 03/28/2021   Procedure: ESOPHAGOGASTRODUODENOSCOPY (EGD) WITH PROPOFOL;  Surgeon: Ronald Lobo, MD;  Location: [facility];  Service: Endoscopy;  Laterality: N/A;   KNEE SURGERY Right 1980   KNEE SURGERY Left 1982   OTHER SURGICAL HISTORY  1992   stomach muscles removed due to Coumadin caused  bleeding.   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   Acquired thrombophilia ([place]) 11/30/2021   Nausea and vomiting 04/18/2021   Diverticulitis of sigmoid colon 04/18/2021   [place] grade B esophagitis 04/18/2021   Migraine headache with aura 04/18/2021   Alcohol abuse 04/18/2021   Generalized anxiety disorder 04/18/2021   Essential hypertension 04/18/2021   Shoulder pain 04/14/2021   Microcytic anemia 03/28/2021   Compression fracture of T12 vertebra ([place]) 03/28/2021   Closed compression fracture of body of L1 vertebra ([place]) 03/28/2021   Aortic atherosclerosis ([place]) 03/28/2021   H/O aortic valve replacement 03/27/2021   Alcohol dependence ([place]) 03/27/2021   Anxiety 10/27/2020   Asthma 05/09/2020   Erectile dysfunction 02/07/2020   Dysphagia 09/19/2016   History of cerebrovascular accident 07/23/2015   Allergic rhinitis 06/30/2015   Long-term (current) use of anticoagulants, INR goal 3-3.5 03/22/2011   Gastroesophageal reflux disease 07/07/2010   Mixed hyperlipidemia 07/07/2010   Heart valve disorder 07/07/2010    PCP:  [name], NP   REFERRING PROVIDER:  [name], NP   REFERRING DIAG: M54.6 (ICD-10-CM) - Acute midline thoracic back pain  Rationale for Evaluation and Treatment: Rehabilitation  THERAPY DIAG:  Acute midline thoracic back pain   ONSET DATE: chronic  SUBJECTIVE:                                                                                                                                                                                           SUBJECTIVE STATEMENT: Patient relates brief episodes of tingling sensations into arms, back, abdomen and chest.  Symptoms only lasts for seconds and is unable  to cite aggravating factors.  Denies any incident or trauma which may have initiated current symptoms.  PERTINENT HISTORY:  2. Acute midline thoracic back pain Continues with back pain to thoracic spine that comes and goes, would like exercises to help him with strength training to help pain - Ambulatory referral to Physical Therapy R RC tear, inoperable Several concussions L RC issues  PAIN:  Are you having pain? Yes: NPRS scale: 7/10 Pain location: Thorax, abdomen, Ues and cervical spine Pain description: electric, tingling Aggravating factors: undetermined Relieving factors: undetermined  PRECAUTIONS: None  WEIGHT BEARING RESTRICTIONS: No  FALLS:  Has patient fallen in last 6 months? No  LIVING ENVIRONMENT: Lives with: lives alone Lives in: House/apartment Stairs: No Has following equipment at home: None  OCCUPATION: retired  PLOF: Independent  PATIENT GOALS: To return to an active lifestyle   NEXT MD VISIT: PRN  OBJECTIVE:   DIAGNOSTIC FINDINGS:  None noted  PATIENT SURVEYS:  FOTO 45(63 predicted)  SCREENING FOR RED FLAGS: Negative  MUSCLE LENGTH: Hamstrings: Right 60 deg; Left 60 deg   POSTURE: rounded shoulders, decreased lumbar lordosis, and increased thoracic kyphosis  PALPATION: N/T  LUMBAR ROM:   AROM eval  Flexion 75%  Extension 50%  Right lateral flexion 25%  Left lateral flexion 25%  Right rotation 50%  Left rotation 50%   (Blank rows = not tested)  LOWER EXTREMITY ROM:     Passive  Right eval Left eval  Hip flexion 90 90  Hip extension    Hip abduction    Hip adduction    Hip internal rotation    Hip external rotation 10 10  Knee flexion    Knee extension    Ankle dorsiflexion    Ankle plantarflexion    Ankle inversion    Ankle eversion     (Blank rows = not tested)  LOWER EXTREMITY MMT:  [facility]  MMT Right eval Left eval  Hip flexion    Hip extension    Hip abduction    Hip adduction    Hip internal rotation     Hip external rotation    Knee flexion    Knee extension    Ankle dorsiflexion  Ankle plantarflexion    Ankle inversion    Ankle eversion     (Blank rows = not tested)  LUMBAR SPECIAL TESTS:  N/T  FUNCTIONAL TESTS:  5 times sit to stand: TBD  GAIT: Distance walked: 11ftx2 Assistive device utilized: None Level of assistance: Complete Independence Comments: unremarkable  TODAY'S TREATMENT:                                                                                                                              DATE: 12/14/22 Eval and HEP    PATIENT EDUCATION:  Education details: Discussed eval findings, rehab rationale and POC and patient is in agreement  Person educated: Patient Education method: Explanation Education comprehension: verbalized understanding and needs further education  HOME EXERCISE PROGRAM: Access Code: [facility] URL: [email or web address] Date: 12/14/2022 Prepared by: Sharlynn Oliphant  Exercises - Shoulder External Rotation and Scapular Retraction with Resistance  - 2 x daily - 5 x weekly - 2 sets - 10 reps - Doorway Pec Stretch at 90 Degrees Abduction  - 2 x daily - 5 x weekly - 2 sets - 3 reps - 30s hold - Supine Shoulder Flexion Extension AAROM with Dowel  - 2 x daily - 5 x weekly - 2 sets - 10 reps  ASSESSMENT:  CLINICAL IMPRESSION: [name] who was seen today for physical therapy evaluation and treatment for thoracic pain, paresthesias and postural restrictions. Thoracic ROM restrictions noted as well as postural dysfunction of increase kyphosis and rounded shoulders.  Previous history of cardiac surgery 20+ yrs ago as well as a marked decline in activity, due to personal reasons and loss of spouse, most likely contributing to symptoms.  OBJECTIVE IMPAIRMENTS: decreased activity tolerance, decreased knowledge of condition, decreased mobility, hypomobility, increased fascial restrictions, impaired flexibility,  impaired UE functional use, postural dysfunction, and pain.   ACTIVITY LIMITATIONS: carrying, lifting, sleeping, and reach over head  PERSONAL FACTORS: Age, Past/current experiences, and Time since onset of injury/illness/exacerbation are also affecting patient's functional outcome.   REHAB POTENTIAL: Good  CLINICAL DECISION MAKING: Evolving/moderate complexity  EVALUATION COMPLEXITY: Moderate   GOALS: Goals reviewed with patient? No  SHORT TERM GOALS: Target date: 01/04/2023  Patient to demonstrate independence in HEP Baseline: [id number] Goal status: INITIAL  2.  Decrease frequency of symptoms by 50% Baseline: Daily occurrence of symptoms Goal status: INITIAL    LONG TERM GOALS: Target date: 01/25/2023    Increase FOTO to 63 Baseline: 45 Goal status: INITIAL  2.  Increase trunk AROM to 75% Baseline:  AROM eval  Flexion 75%  Extension 50%  Right lateral flexion 25%  Left lateral flexion 25%  Right rotation 50%  Left rotation 50%   Goal status: INITIAL  3.  Decrease worst intensity of symptoms to 3/10 Baseline: 7/10 Goal status: INITIAL  4.  Decrease frequency of symptoms by 75% Baseline: daily symptoms Goal status: INITIAL   PLAN:  PT FREQUENCY: 1-2x/week  PT  DURATION: 4 weeks  PLANNED INTERVENTIONS: Therapeutic exercises, Therapeutic activity, Neuromuscular re-education, Balance training, Gait training, Patient/Family education, Self Care, Joint mobilization, Dry Needling, Manual therapy, and Re-evaluation.  PLAN FOR NEXT SESSION: HEP review and update, postural training, ROM and flexibility tasks, aerobic work   Lanice Shirts, PT 12/13/2022, 11:46 AM

## 2022-12-14 ENCOUNTER — Ambulatory Visit: Payer: Medicare Other | Attending: Nurse Practitioner

## 2022-12-14 ENCOUNTER — Other Ambulatory Visit: Payer: Self-pay

## 2022-12-14 DIAGNOSIS — M546 Pain in thoracic spine: Secondary | ICD-10-CM | POA: Diagnosis present

## 2022-12-14 DIAGNOSIS — R293 Abnormal posture: Secondary | ICD-10-CM | POA: Diagnosis present

## 2022-12-17 ENCOUNTER — Telehealth: Payer: Self-pay

## 2022-12-17 NOTE — Telephone Encounter (Signed)
I called patient and confirmed his insurance. Patient states that the reason why [employer] denied him is because they said it's been within the three year period since his last test. Message routed to PCP Dewaine Oats, Carlos American, NP

## 2022-12-17 NOTE — Telephone Encounter (Signed)
Patient called and left voicemail stating that his insurance was denied by [employer]. I called patient to confirm his insurance but phone went to voicemail. I left voicemail, and Im waiting for patient to call the office back.

## 2022-12-17 NOTE — Telephone Encounter (Signed)
I'm not sure why you were routed this message either. Also I don't recall having a login for [employer]. However, thank you for helping me with this patient.

## 2022-12-17 NOTE — Telephone Encounter (Signed)
I have contacted exact sciences and obtained cologuard results from 07/21/2021. Patient had a positive cologuard at this time and the results were forwarded to the ordering physician with [facility], Inis Sizer, PA.  Patient will need to further follow-up with his GI doctor in reference to when his next colon cancer screening is due as we do not have those results from [facility].

## 2022-12-17 NOTE — Telephone Encounter (Signed)
Please refer to media tab.

## 2022-12-17 NOTE — Telephone Encounter (Signed)
Patient called and notified. Thank you.

## 2022-12-17 NOTE — Telephone Encounter (Signed)
I am not certain why this was forwarded to me.   Jasmine would  need to log into the cologuard portal to access patients last cologuard results, print, abstract, have [name], NP sign and send to scanning. I did not see any results under the media tab  Thanks,  S.Chrae B/CMA

## 2022-12-17 NOTE — Telephone Encounter (Signed)
Can we get the date of his last cologuard

## 2022-12-29 NOTE — Therapy (Deleted)
OUTPATIENT PHYSICAL THERAPY TREATMENT NOTE   Patient Name: Garrett Terry MRN: [id number] DOB:[birth year], 77 y.o., male Today's Date: 12/29/2022  PCP: [name], NP   REFERRING PROVIDER: [name], NP    END OF SESSION:    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[place]) 03/28/2021   Atypical mole 11/18/2021   Right Lower Back (moderate with halo nevus effect) (wider shave)   Closed compression fracture of body of L1 vertebra ([place]) 0000000   Complication of anesthesia    PONV   Compression fracture of T12 vertebra ([place]) 03/28/2021   Depression    History of bladder cancer    History of pneumonia 1959   History of prostate cancer    History of scarlet fever 1956   History of stroke 2009   Hypertension    Long term current use of anticoagulant 03/22/2011   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   Right Alar Crease   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   Right Temple (in situ) (curet and 5FU)   Past Surgical History:  Procedure Laterality Date   AORTIC VALVE REPLACEMENT     mechanical   BIOPSY  03/28/2021   Procedure: BIOPSY;  Surgeon: Ronald Lobo, MD;  Location: [facility];  Service: Endoscopy;;   BLADDER SURGERY  1983   Bladder Cancer Surgery    ESOPHAGOGASTRODUODENOSCOPY (EGD) WITH PROPOFOL N/A 03/28/2021   Procedure: ESOPHAGOGASTRODUODENOSCOPY (EGD) WITH PROPOFOL;  Surgeon: Ronald Lobo, MD;  Location: [facility];  Service: Endoscopy;  Laterality: N/A;   KNEE SURGERY Right 1980   KNEE SURGERY Left 1982   OTHER SURGICAL HISTORY  1992   stomach muscles removed due to Coumadin caused bleeding.   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    Acquired thrombophilia ([place]) 11/30/2021   Nausea and vomiting 04/18/2021   Diverticulitis of sigmoid colon 04/18/2021   [place] grade B esophagitis 04/18/2021   Migraine headache with aura 04/18/2021   Alcohol abuse 04/18/2021   Generalized anxiety disorder 04/18/2021   Essential hypertension 04/18/2021   Shoulder pain 04/14/2021   Microcytic anemia 03/28/2021   Compression fracture of T12 vertebra ([place]) 03/28/2021   Closed compression fracture of body of L1 vertebra ([place]) 03/28/2021   Aortic atherosclerosis ([place]) 03/28/2021   H/O aortic valve replacement 03/27/2021   Alcohol dependence ([place]) 03/27/2021   Anxiety 10/27/2020   Asthma 05/09/2020   Erectile dysfunction 02/07/2020   Dysphagia 09/19/2016   History of cerebrovascular accident 07/23/2015   Allergic rhinitis 06/30/2015   Long-term (current) use of anticoagulants, INR goal 3-3.5 03/22/2011   Gastroesophageal reflux disease 07/07/2010   Mixed hyperlipidemia 07/07/2010   Heart valve disorder 07/07/2010    REFERRING DIAG: M54.6 (ICD-10-CM) - Acute midline thoracic back pain   THERAPY DIAG:  No diagnosis found.  Rationale for Evaluation and Treatment Rehabilitation  PERTINENT HISTORY: 2. Acute midline thoracic back pain Continues with back pain to thoracic spine that comes and goes, would like exercises to help him with strength training to help pain - Ambulatory referral to Physical Therapy R RC tear, inoperable Several concussions L RC issues    PRECAUTIONS: none  SUBJECTIVE:  SUBJECTIVE STATEMENT:  ***   PAIN:  Are you having pain? {[id number]}   OBJECTIVE: (objective measures completed at initial evaluation unless otherwise dated)   DIAGNOSTIC FINDINGS:  None noted   PATIENT SURVEYS:  FOTO 45(63 predicted)    SCREENING FOR RED FLAGS: Negative   MUSCLE LENGTH: Hamstrings: Right 60 deg; Left 60 deg     POSTURE: rounded shoulders, decreased lumbar lordosis, and increased thoracic kyphosis   PALPATION: N/T   LUMBAR ROM:    AROM eval  Flexion 75%  Extension 50%  Right lateral flexion 25%  Left lateral flexion 25%  Right rotation 50%  Left rotation 50%   (Blank rows = not tested)   LOWER EXTREMITY ROM:      Passive  Right eval Left eval  Hip flexion 90 90  Hip extension      Hip abduction      Hip adduction      Hip internal rotation      Hip external rotation 10 10  Knee flexion      Knee extension      Ankle dorsiflexion      Ankle plantarflexion      Ankle inversion      Ankle eversion       (Blank rows = not tested)   LOWER EXTREMITY MMT:  [facility]   MMT Right eval Left eval  Hip flexion      Hip extension      Hip abduction      Hip adduction      Hip internal rotation      Hip external rotation      Knee flexion      Knee extension      Ankle dorsiflexion      Ankle plantarflexion      Ankle inversion      Ankle eversion       (Blank rows = not tested)   LUMBAR SPECIAL TESTS:  N/T   FUNCTIONAL TESTS:  5 times sit to stand: TBD   GAIT: Distance walked: 10fx2 Assistive device utilized: None Level of assistance: Complete Independence Comments: unremarkable   TODAY'S TREATMENT:                                                                                                                              DATE: 12/14/22 Eval and HEP      PATIENT EDUCATION:  Education details: Discussed eval findings, rehab rationale and POC and patient is in agreement  Person educated: Patient Education method: Explanation Education comprehension: verbalized understanding and needs further education   HOME EXERCISE PROGRAM: Access Code: [facility] LLCURL: [email or web address] Date: 12/14/2022 Prepared by: JSharlynn Oliphant  Exercises - Shoulder External  Rotation and Scapular Retraction with Resistance  - 2 x daily - 5 x weekly - 2 sets - 10 reps - Doorway Pec Stretch at 90 Degrees Abduction  - 2 x daily - 5 x weekly - 2 sets -  3 reps - 30s hold - Supine Shoulder Flexion Extension AAROM with Dowel  - 2 x daily - 5 x weekly - 2 sets - 10 reps   ASSESSMENT:   CLINICAL IMPRESSION: [name] who was seen today for physical therapy evaluation and treatment for thoracic pain, paresthesias and postural restrictions. Thoracic ROM restrictions noted as well as postural dysfunction of increase kyphosis and rounded shoulders.  Previous history of cardiac surgery 20+ yrs ago as well as a marked decline in activity, due to personal reasons and loss of spouse, most likely contributing to symptoms.   OBJECTIVE IMPAIRMENTS: decreased activity tolerance, decreased knowledge of condition, decreased mobility, hypomobility, increased fascial restrictions, impaired flexibility, impaired UE functional use, postural dysfunction, and pain.    ACTIVITY LIMITATIONS: carrying, lifting, sleeping, and reach over head   PERSONAL FACTORS: Age, Past/current experiences, and Time since onset of injury/illness/exacerbation are also affecting patient's functional outcome.    REHAB POTENTIAL: Good   CLINICAL DECISION MAKING: Evolving/moderate complexity   EVALUATION COMPLEXITY: Moderate     GOALS: Goals reviewed with patient? No   SHORT TERM GOALS: Target date: 01/04/2023   Patient to demonstrate independence in HEP Baseline: [id number] Goal status: INITIAL   2.  Decrease frequency of symptoms by 50% Baseline: Daily occurrence of symptoms Goal status: INITIAL       LONG TERM GOALS: Target date: 01/25/2023     Increase FOTO to 63 Baseline: 45 Goal status: INITIAL   2.  Increase trunk AROM to 75% Baseline:  AROM eval  Flexion 75%  Extension 50%  Right lateral flexion 25%  Left lateral flexion 25%  Right rotation 50%  Left rotation 50%     Goal status: INITIAL   3.  Decrease worst intensity of symptoms to 3/10 Baseline: 7/10 Goal status: INITIAL   4.  Decrease frequency of symptoms by 75% Baseline: daily symptoms Goal status: INITIAL     PLAN:   PT FREQUENCY: 1-2x/week   PT DURATION: 4 weeks   PLANNED INTERVENTIONS: Therapeutic exercises, Therapeutic activity, Neuromuscular re-education, Balance training, Gait training, Patient/Family education, Self Care, Joint mobilization, Dry Needling, Manual therapy, and Re-evaluation.   PLAN FOR NEXT SESSION: HEP review and update, postural training, ROM and flexibility tasks, aerobic work   Lanice Shirts, PT 12/29/2022, 4:47 PM

## 2022-12-30 ENCOUNTER — Telehealth: Payer: Self-pay

## 2022-12-30 ENCOUNTER — Ambulatory Visit: Payer: Medicare Other

## 2022-12-30 NOTE — Telephone Encounter (Signed)
TC due to missed visit, number rang busy over several attempts.

## 2023-01-03 ENCOUNTER — Encounter: Payer: Self-pay | Admitting: Nurse Practitioner

## 2023-01-03 ENCOUNTER — Ambulatory Visit (INDEPENDENT_AMBULATORY_CARE_PROVIDER_SITE_OTHER): Payer: Medicare Other | Admitting: Nurse Practitioner

## 2023-01-03 VITALS — BP 128/76 | HR 86 | Temp 97.3°F | Ht 69.0 in | Wt 184.0 lb

## 2023-01-03 DIAGNOSIS — Z7901 Long term (current) use of anticoagulants: Secondary | ICD-10-CM

## 2023-01-03 DIAGNOSIS — Z952 Presence of prosthetic heart valve: Secondary | ICD-10-CM | POA: Diagnosis not present

## 2023-01-03 DIAGNOSIS — F5101 Primary insomnia: Secondary | ICD-10-CM

## 2023-01-03 DIAGNOSIS — E782 Mixed hyperlipidemia: Secondary | ICD-10-CM | POA: Diagnosis not present

## 2023-01-03 DIAGNOSIS — D229 Melanocytic nevi, unspecified: Secondary | ICD-10-CM

## 2023-01-03 DIAGNOSIS — D6869 Other thrombophilia: Secondary | ICD-10-CM | POA: Diagnosis not present

## 2023-01-03 DIAGNOSIS — D500 Iron deficiency anemia secondary to blood loss (chronic): Secondary | ICD-10-CM

## 2023-01-03 LAB — CBC WITH DIFFERENTIAL/PLATELET
Absolute Monocytes: 722 cells/uL (ref 200–950)
Basophils Absolute: 60 cells/uL (ref 0–200)
Basophils Relative: 0.7 %
Eosinophils Absolute: 344 cells/uL (ref 15–500)
Eosinophils Relative: 4 %
HCT: 37.1 % — ABNORMAL LOW (ref 38.5–50.0)
Hemoglobin: 12.1 g/dL — ABNORMAL LOW (ref 13.2–17.1)
Lymphs Abs: 1333 cells/uL (ref 850–3900)
MCH: 26.5 pg — ABNORMAL LOW (ref 27.0–33.0)
MCHC: 32.6 g/dL (ref 32.0–36.0)
MCV: 81.4 fL (ref 80.0–100.0)
MPV: 10.9 fL (ref 7.5–12.5)
Monocytes Relative: 8.4 %
Neutro Abs: 6140 cells/uL (ref 1500–7800)
Neutrophils Relative %: 71.4 %
Platelets: 318 10*3/uL (ref 140–400)
RBC: 4.56 10*6/uL (ref 4.20–5.80)
RDW: 12.6 % (ref 11.0–15.0)
Total Lymphocyte: 15.5 %
WBC: 8.6 10*3/uL (ref 3.8–10.8)

## 2023-01-03 LAB — POCT INR: INR: 3.6 — AB (ref 2.0–3.0)

## 2023-01-03 NOTE — Patient Instructions (Signed)
[facility]  Address: [address] [place], [place] [address] Hours:   Phone: [phone]

## 2023-01-03 NOTE — Progress Notes (Signed)
Careteam: Patient Care Team: [name], NP as PCP - General (Geriatric Medicine)  PLACE OF SERVICE:  [place] Directive information Does Patient Have a Medical Advance Directive?: Yes, Type of Advance Directive: [place];Living will;Out of facility DNR (pink MOST or yellow form), Pre-existing out of facility DNR order (yellow form or pink MOST form): Yellow form placed in chart (order not valid for inpatient use), Does patient want to make changes to medical advance directive?: No - Patient declined  Allergies  Allergen Reactions   Aspirin     Other reaction(s): Other (See Comments) Can't take due to taking blood thinners   Morphine And Related Other (See Comments)    Other reaction(s): Other (See Comments) Hallucinations     Nitroglycerin     Severe headache   Promethazine Other (See Comments)    Other reaction(s): Other (See Comments) Hallucinations    Promethazine Hcl Other (See Comments)    Chief Complaint  Patient presents with   Anticoagulation    4 week PT/INR check. Discuss getting referral to dermatology.      HPI: [name] for INR follow up.  INR goal 3-3.5 todays INR 3.6 Lab Results  Component Value Date   INR 3.5 (A) 12/06/2022   INR 2.8 11/03/2022   INR 3.9 10/04/2022  He is taking coumadin 3.'5mg'$  daily except 4 mg every 3rd Wednesday.   He continues to sleep well  Cholesterol at goal on lovastatin  GERD- changing his dinner time. Taking protonix routinely and pepcd PRN.   He has been off iron for about a month.   Review of Systems:  Review of Systems  Constitutional:  Negative for chills, fever and weight loss.  HENT:  Negative for tinnitus.   Respiratory:  Negative for cough, sputum production and shortness of breath.   Cardiovascular:  Negative for chest pain, palpitations and leg swelling.  Gastrointestinal:  Negative for abdominal pain, constipation, diarrhea and heartburn.   Genitourinary:  Negative for dysuria, frequency and urgency.  Musculoskeletal:  Negative for back pain, falls, joint pain and myalgias.  Skin: Negative.   Neurological:  Negative for dizziness and headaches.  Psychiatric/Behavioral:  Negative for depression and memory loss. The patient does not have insomnia.     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[place]) 03/28/2021   Atypical mole 11/18/2021   Right Lower Back (moderate with halo nevus effect) (wider shave)   Closed compression fracture of body of L1 vertebra ([facility]) 0000000   Complication of anesthesia    PONV   Compression fracture of T12 vertebra ([facility]) 03/28/2021   Depression    History of bladder cancer    History of pneumonia 1959   History of prostate cancer    History of scarlet fever 1956   History of stroke 2009   Hypertension    Long term current use of anticoagulant 03/22/2011   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   Right Alar Crease   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   Right [place] (in situ) (curet and 5FU)   Past Surgical History:  Procedure Laterality Date   AORTIC VALVE REPLACEMENT     mechanical   BIOPSY  03/28/2021   Procedure: BIOPSY;  Surgeon: Ronald Lobo, MD;  [facility];  Service: Endoscopy;;   BLADDER SURGERY  1983   Bladder Cancer Surgery    ESOPHAGOGASTRODUODENOSCOPY (EGD)  WITH PROPOFOL N/A 03/28/2021   Procedure: ESOPHAGOGASTRODUODENOSCOPY (EGD) WITH PROPOFOL;  Surgeon: Ronald Lobo, MD;  [facility];  Service: Endoscopy;  Laterality: N/A;   KNEE SURGERY Right 1980   KNEE SURGERY Left 1982   OTHER SURGICAL HISTORY  1992   stomach muscles removed due to Coumadin caused bleeding.   SKIN LESION EXCISION     Some cancerous   SKIN SURGERY  01/30/2022   Basal Cell on side of nose   TONSILLECTOMY  1965   Social History:   reports that he has never smoked. His smokeless tobacco use  includes snuff. He reports that he does not currently use alcohol. He reports that he does not use drugs.  Family History  Problem Relation Age of Onset   Heart disease Neg Hx     Medications: Patient's Medications  New Prescriptions   No medications on file  Previous Medications   ACETAMINOPHEN (TYLENOL) 500 MG TABLET    Take 500 mg by mouth daily as needed for moderate pain.   ALBUTEROL (VENTOLIN HFA) 108 (90 BASE) MCG/ACT INHALER    Inhale 1 puff into the lungs every 6 (six) hours as needed for wheezing or shortness of breath.   AMOXICILLIN (AMOXIL) 500 MG CAPSULE    Take 1,000 mg by mouth 2 (two) times daily. Prior to dental procedure   CETIRIZINE (ZYRTEC) 10 MG TABLET    Take 10 mg by mouth daily. Take 1 tablet by mouth daily for allergies.   CHOLECALCIFEROL (VITAMIN D-3 PO)    Take 1 tablet by mouth 2 (two) times daily. Dose unknown   DOCUSATE SODIUM (COLACE) 100 MG CAPSULE    Take 100 mg by mouth as needed for mild constipation.   FAMOTIDINE (PEPCID) 20 MG TABLET    Take 20 mg by mouth daily.   FLUTICASONE (FLONASE) 50 MCG/ACT NASAL SPRAY    PLACE 1 SPRAY INTO BOTH NOSTRILS TWO TIMES A DAY   FUROSEMIDE (LASIX) 20 MG TABLET    Take 1 tablet (20 mg total) by mouth daily as needed for fluid or edema (3lb weight gain).   LOVASTATIN (MEVACOR) 40 MG TABLET    TAKE 1 TABLET BY MOUTH AT BEDTIME   PANTOPRAZOLE (PROTONIX) 40 MG TABLET    TAKE ONE TABLET BY MOUTH TWICE A DAY   PROPYLENE GLYCOL (SYSTANE BALANCE) 0.6 % SOLN    Apply 1 drop to eye as needed (for dry eye).   RAMIPRIL (ALTACE) 10 MG CAPSULE    TAKE ONE CAPSULE BY MOUTH TWICE A DAY   TRAZODONE (DESYREL) 50 MG TABLET    Take 0.5 tablets (25 mg total) by mouth at bedtime.   VITAMIN B-12 (CYANOCOBALAMIN) 1000 MCG TABLET    Take 1,000 mcg by mouth daily.   WARFARIN (COUMADIN) 1 MG TABLET    TAKE 1/2 TABLET BY MOUTH DAILY WITH '3MG'$  TO TOTAL 3.5 MG   WARFARIN (COUMADIN) 3 MG TABLET    TAKE 1 TABLET BY MOUTH DAILY ALONG WITH 0.5 MILLIGRAM  TABLET FOR A TOTAL DOSE OF 3.5 MILLIGRAMS DAILY  Modified Medications   No medications on file  Discontinued Medications   No medications on file    Physical Exam:  Vitals:   01/03/23 1148  BP: 128/76  Pulse: 86  Temp: (!) 97.3 F (36.3 C)  TempSrc: Temporal  SpO2: 99%  Weight: 184 lb (83.5 kg)  Height: '5\' 9"'$  (1.753 m)   Body mass index is 27.17 kg/m. Wt Readings from Last 3 Encounters:  01/03/23  184 lb (83.5 kg)  12/06/22 183 lb (83 kg)  11/02/22 184 lb (83.5 kg)    Physical Exam Constitutional:      General: He is not in acute distress.    Appearance: He is well-developed. He is not diaphoretic.  HENT:     Head: Normocephalic and atraumatic.     Right Ear: External ear normal.     Left Ear: External ear normal.     Mouth/Throat:     Pharynx: No oropharyngeal exudate.  Eyes:     Conjunctiva/sclera: Conjunctivae normal.     Pupils: Pupils are equal, round, and reactive to light.  Cardiovascular:     Rate and Rhythm: Normal rate and regular rhythm.     Heart sounds: Murmur heard.  Pulmonary:     Effort: Pulmonary effort is normal.     Breath sounds: Normal breath sounds.  Abdominal:     General: Bowel sounds are normal.     Palpations: Abdomen is soft.  Musculoskeletal:        General: No tenderness.     Cervical back: Normal range of motion and neck supple.     Right lower leg: No edema.     Left lower leg: No edema.  Skin:    General: Skin is warm and dry.  Neurological:     Mental Status: He is alert and oriented to person, place, and time.     Labs reviewed: Basic Metabolic Panel: Recent Labs    03/08/22 1618 03/15/22 0830 11/03/22 1121  NA 138 138 138  K 4.5 4.7 4.5  CL 102 104 104  CO2 '29 29 22  '$ GLUCOSE 84 131* 99  BUN '19 14 18  '$ CREATININE 1.25 1.37* 1.30*  CALCIUM 8.9 9.0 9.0   Liver Function Tests: Recent Labs    03/15/22 0830 11/03/22 1121  AST 14 17  ALT 15 14  BILITOT 0.4 0.3  PROT 5.5* 6.0*   No results for input(s):  "LIPASE", "AMYLASE" in the last 8760 hours. No results for input(s): "AMMONIA" in the last 8760 hours. CBC: Recent Labs    01/22/22 1539 03/08/22 1618 11/03/22 1121  WBC 9.8 10.7 7.5  NEUTROABS  --  7,608 5,183  HGB 14.8 14.9 14.1  HCT 44.1 44.9 41.7  MCV 90 90.0 87.1  PLT 265 291 269   Lipid Panel: Recent Labs    03/15/22 0830 11/03/22 1121  CHOL 188 173  HDL 49 48  LDLCALC 107* 98  TRIG 202* 173*  CHOLHDL 3.8 3.6   TSH: No results for input(s): "TSH" in the last 8760 hours. A1C: No results found for: "HGBA1C"   Assessment/Plan 1. Long-term (current) use of anticoagulants, INR goal 3-3.5 -INR 3.6 goal 3-3.5. will continue current dose of coumadin - POC INR  2. H/O aortic valve replacement -stable without worsening shortness of breath, LE edema or chest pains   3. Acquired thrombophilia ([place]) Due to AVR, continues on coumadin  4. Mixed hyperlipidemia Lipids at goal on lovastatin  5. Primary insomnia -doing well on trazodone qhs for sleep.   7. Iron deficiency anemia due to chronic blood loss Has been off iron for about a month, will follow up cbc - CBC with Differential/Platelet  8. Multiple atypical skin moles Previously being followed by derm but office shut down.  - Ambulatory referral to Dermatology   Return in about 4 weeks (around 01/31/2023) for INR check .  Carlos American. [place], [facility] [phone]

## 2023-01-05 ENCOUNTER — Other Ambulatory Visit: Payer: Self-pay

## 2023-01-05 MED ORDER — IRON (FERROUS SULFATE) 325 (65 FE) MG PO TABS: 325.0000 mg | ORAL_TABLET | Freq: Every day | ORAL | 11 refills | Status: DC

## 2023-01-06 ENCOUNTER — Ambulatory Visit: Payer: Medicare Other

## 2023-01-12 ENCOUNTER — Other Ambulatory Visit: Payer: Self-pay | Admitting: Nurse Practitioner

## 2023-01-12 DIAGNOSIS — I10 Essential (primary) hypertension: Secondary | ICD-10-CM

## 2023-01-13 ENCOUNTER — Ambulatory Visit: Payer: Medicare Other

## 2023-01-20 ENCOUNTER — Ambulatory Visit: Payer: Medicare Other

## 2023-01-27 ENCOUNTER — Ambulatory Visit: Payer: Medicare Other

## 2023-01-27 NOTE — Therapy (Deleted)
OUTPATIENT PHYSICAL THERAPY TREATMENT NOTE   Patient Name: Garrett Terry MRN: [id number] DOB:11/11/1945, 77 y.o., male Today's Date: 12/29/2022  PCP: Eubanks, Jessica K, NP   REFERRING PROVIDER: Eubanks, Jessica K, NP    END OF SESSION:    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   Right Alar Crease   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   Right Temple (in situ) (curet and 5FU)   Past Surgical History:  Procedure Laterality Date   AORTIC VALVE REPLACEMENT     mechanical   BIOPSY  03/28/2021   Procedure: BIOPSY;  Surgeon: Buccini, Robert, MD;  Location: [facility];  Service: Endoscopy;;   BLADDER SURGERY  1983   Bladder Cancer Surgery    ESOPHAGOGASTRODUODENOSCOPY (EGD) WITH PROPOFOL N/A 03/28/2021   Procedure: ESOPHAGOGASTRODUODENOSCOPY (EGD) WITH PROPOFOL;  Surgeon: Buccini, Robert, MD;  Location: [facility];  Service: Endoscopy;  Laterality: N/A;   KNEE SURGERY Right 1980   KNEE SURGERY Left 1982   OTHER SURGICAL HISTORY  1992   stomach muscles removed due to Coumadin caused bleeding.   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    Acquired thrombophilia (HCC) 11/30/2021   Nausea and vomiting 04/18/2021   Diverticulitis of sigmoid colon 04/18/2021   [place] grade B esophagitis 04/18/2021   Migraine headache with aura 04/18/2021   Alcohol abuse 04/18/2021   Generalized anxiety disorder 04/18/2021   Essential hypertension 04/18/2021   Shoulder pain 04/14/2021   Microcytic anemia 03/28/2021   Compression fracture of T12 vertebra (HCC) 03/28/2021   Closed compression fracture of body of L1 vertebra (HCC) 03/28/2021   Aortic atherosclerosis (HCC) 03/28/2021   H/O aortic valve replacement 03/27/2021   Alcohol dependence (HCC) 03/27/2021   Anxiety 10/27/2020   Asthma 05/09/2020   Erectile dysfunction 02/07/2020   Dysphagia 09/19/2016   History of cerebrovascular accident 07/23/2015   Allergic rhinitis 06/30/2015   Long-term (current) use of anticoagulants, INR goal 3-3.5 03/22/2011   Gastroesophageal reflux disease 07/07/2010   Mixed hyperlipidemia 07/07/2010   Heart valve disorder 07/07/2010    REFERRING DIAG: M54.6 (ICD-10-CM) - Acute midline thoracic back pain   THERAPY DIAG:  No diagnosis found.  Rationale for Evaluation and Treatment Rehabilitation  PERTINENT HISTORY: 2. Acute midline thoracic back pain Continues with back pain to thoracic spine that comes and goes, would like exercises to help him with strength training to help pain - Ambulatory referral to Physical Therapy R RC tear, inoperable Several concussions L RC issues    PRECAUTIONS: none  SUBJECTIVE:                                                                                                                                                                                        SUBJECTIVE STATEMENT:  ***   PAIN:  Are you having pain? {[id number]}   OBJECTIVE: (objective measures completed at initial evaluation unless otherwise dated)   DIAGNOSTIC FINDINGS:  None noted   PATIENT SURVEYS:  FOTO 45(63 predicted)    SCREENING FOR RED FLAGS: Negative   MUSCLE LENGTH: Hamstrings: Right 60 deg; Left 60 deg     POSTURE: rounded shoulders, decreased lumbar lordosis, and increased thoracic kyphosis   PALPATION: N/T   LUMBAR ROM:    AROM eval  Flexion 75%  Extension 50%  Right lateral flexion 25%  Left lateral flexion 25%  Right rotation 50%  Left rotation 50%   (Blank rows = not tested)   LOWER EXTREMITY ROM:      Passive  Right eval Left eval  Hip flexion 90 90  Hip extension      Hip abduction      Hip adduction      Hip internal rotation      Hip external rotation 10 10  Knee flexion      Knee extension      Ankle dorsiflexion      Ankle plantarflexion      Ankle inversion      Ankle eversion       (Blank rows = not tested)   LOWER EXTREMITY MMT:  WFL   MMT Right eval Left eval  Hip flexion      Hip extension      Hip abduction      Hip adduction      Hip internal rotation      Hip external rotation      Knee flexion      Knee extension      Ankle dorsiflexion      Ankle plantarflexion      Ankle inversion      Ankle eversion       (Blank rows = not tested)   LUMBAR SPECIAL TESTS:  N/T   FUNCTIONAL TESTS:  5 times sit to stand: TBD   GAIT: Distance walked: 75ftx2 Assistive device utilized: None Level of assistance: Complete Independence Comments: unremarkable   TODAY'S TREATMENT:                                                                                                                              DATE: 12/14/22 Eval and HEP      PATIENT EDUCATION:  Education details: Discussed eval findings, rehab rationale and POC and patient is in agreement  Person educated: Patient Education method: Explanation Education comprehension: verbalized understanding and needs further education   HOME EXERCISE PROGRAM: Access Code: [email or web address] URL: [email or web address] Date: 12/14/2022 Prepared by: Kveon Casanas   Exercises - Shoulder External  Rotation and Scapular Retraction with Resistance  - 2 x daily - 5 x weekly - 2 sets - 10 reps - Doorway Pec Stretch at 90 Degrees Abduction  - 2 x daily - 5 x weekly - 2 sets -   3 reps - 30s hold - Supine Shoulder Flexion Extension AAROM with Dowel  - 2 x daily - 5 x weekly - 2 sets - 10 reps   ASSESSMENT:   CLINICAL IMPRESSION: Patient is a 76 y.o. male who was seen today for physical therapy evaluation and treatment for thoracic pain, paresthesias and postural restrictions. Thoracic ROM restrictions noted as well as postural dysfunction of increase kyphosis and rounded shoulders.  Previous history of cardiac surgery 20+ yrs ago as well as a marked decline in activity, due to personal reasons and loss of spouse, most likely contributing to symptoms.   OBJECTIVE IMPAIRMENTS: decreased activity tolerance, decreased knowledge of condition, decreased mobility, hypomobility, increased fascial restrictions, impaired flexibility, impaired UE functional use, postural dysfunction, and pain.    ACTIVITY LIMITATIONS: carrying, lifting, sleeping, and reach over head   PERSONAL FACTORS: Age, Past/current experiences, and Time since onset of injury/illness/exacerbation are also affecting patient's functional outcome.    REHAB POTENTIAL: Good   CLINICAL DECISION MAKING: Evolving/moderate complexity   EVALUATION COMPLEXITY: Moderate     GOALS: Goals reviewed with patient? No   SHORT TERM GOALS: Target date: 01/04/2023   Patient to demonstrate independence in HEP Baseline: [email or web address] Goal status: INITIAL   2.  Decrease frequency of symptoms by 50% Baseline: Daily occurrence of symptoms Goal status: INITIAL       LONG TERM GOALS: Target date: 01/25/2023     Increase FOTO to 63 Baseline: 45 Goal status: INITIAL   2.  Increase trunk AROM to 75% Baseline:  AROM eval  Flexion 75%  Extension 50%  Right lateral flexion 25%  Left lateral flexion 25%  Right rotation 50%  Left rotation 50%     Goal status: INITIAL   3.  Decrease worst intensity of symptoms to 3/10 Baseline: 7/10 Goal status: INITIAL   4.  Decrease frequency of symptoms by 75% Baseline: daily symptoms Goal status: INITIAL     PLAN:   PT FREQUENCY: 1-2x/week   PT DURATION: 4 weeks   PLANNED INTERVENTIONS: Therapeutic exercises, Therapeutic activity, Neuromuscular re-education, Balance training, Gait training, Patient/Family education, Self Care, Joint mobilization, Dry Needling, Manual therapy, and Re-evaluation.   PLAN FOR NEXT SESSION: HEP review and update, postural training, ROM and flexibility tasks, aerobic work   Maria Gallicchio M Jaylee Lantry, PT 12/29/2022, 4:47 PM     

## 2023-01-31 ENCOUNTER — Encounter: Payer: Self-pay | Admitting: Nurse Practitioner

## 2023-01-31 ENCOUNTER — Ambulatory Visit (INDEPENDENT_AMBULATORY_CARE_PROVIDER_SITE_OTHER): Payer: Medicare Other | Admitting: Nurse Practitioner

## 2023-01-31 VITALS — BP 126/88 | HR 77 | Temp 97.8°F | Ht 69.0 in | Wt 184.0 lb

## 2023-01-31 DIAGNOSIS — D500 Iron deficiency anemia secondary to blood loss (chronic): Secondary | ICD-10-CM

## 2023-01-31 DIAGNOSIS — Z7901 Long term (current) use of anticoagulants: Secondary | ICD-10-CM

## 2023-01-31 DIAGNOSIS — R5383 Other fatigue: Secondary | ICD-10-CM | POA: Diagnosis not present

## 2023-01-31 DIAGNOSIS — D6869 Other thrombophilia: Secondary | ICD-10-CM | POA: Diagnosis not present

## 2023-01-31 LAB — POCT INR: INR: 4.3 — AB (ref 2.0–3.0)

## 2023-01-31 NOTE — Patient Instructions (Addendum)
To take 2 mg today and tomorrow then increase back to 3.5 mg daily (and resume previous order)   Follow-up in 4 weeks for PT/INR check

## 2023-01-31 NOTE — Progress Notes (Unsigned)
Careteam: Patient Care Team: Garrett Chandler, NP as PCP - General (Geriatric Medicine)  PLACE OF SERVICE:  [place]  Advanced Directive information    Allergies  Allergen Reactions   Aspirin     Other reaction(s): Other (See Comments) Can't take due to taking blood thinners   Morphine And Related Other (See Comments)    Other reaction(s): Other (See Comments) Hallucinations     Nitroglycerin     Severe headache   Promethazine Other (See Comments)    Other reaction(s): Other (See Comments) Hallucinations    Promethazine Hcl Other (See Comments)    Chief Complaint  Patient presents with   Anticoagulation    4 week PTINR check. No missed doses, no diet change, and nose bleed yesterday (common during this time of year). Patient took amoxicillin about 2 weeks ago. Patient states overall he has experienced low energy level.      HPI: [name] INR check.   INR 4.6.   He had procedure on right and and was on amoxicillin but has been off for 1 week.    Reports energy level is 0.   Noted to be anemic with last labs, restarted on iron supplement.   Had nose bleed yesterday. Not unusual in the spring. No other abnormal bruising or bleeding.   Review of Systems:  Review of Systems  Constitutional:  Negative for chills, fever and weight loss.  HENT:  Negative for tinnitus.   Respiratory:  Negative for cough, sputum production and shortness of breath.   Cardiovascular:  Negative for chest pain, palpitations and leg swelling.  Gastrointestinal:  Negative for abdominal pain, constipation, diarrhea and heartburn.  Genitourinary:  Negative for dysuria, frequency and urgency.  Musculoskeletal:  Negative for back pain, falls, joint pain and myalgias.  Skin: Negative.   Neurological:  Negative for dizziness and headaches.  Psychiatric/Behavioral:  Negative for depression and memory loss. The patient does not have insomnia.     Past Medical History:   Diagnosis Date   Anxiety    Aortic atherosclerosis 03/28/2021   Atypical mole 11/18/2021   Right Lower Back (moderate with halo nevus effect) (wider shave)   Closed compression fracture of body of L1 vertebra 0000000   Complication of anesthesia    PONV   Compression fracture of T12 vertebra 03/28/2021   Depression    History of bladder cancer    History of pneumonia 1959   History of prostate cancer    History of scarlet fever 1956   History of stroke 2009   Hypertension    Long term current use of anticoagulant 03/22/2011   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   Right Alar Crease   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   Right Temple (in situ) (curet and 5FU)   Past Surgical History:  Procedure Laterality Date   AORTIC VALVE REPLACEMENT     mechanical   BIOPSY  03/28/2021   Procedure: BIOPSY;  Surgeon: Ronald Lobo, MD;  Location: [facility];  Service: Endoscopy;;   BLADDER SURGERY  1983   Bladder Cancer Surgery    ESOPHAGOGASTRODUODENOSCOPY (EGD) WITH PROPOFOL N/A 03/28/2021   Procedure: ESOPHAGOGASTRODUODENOSCOPY (EGD) WITH PROPOFOL;  Surgeon: Ronald Lobo, MD;  Location: [facility];  Service: Endoscopy;  Laterality: N/A;   KNEE SURGERY Right 1980   KNEE SURGERY Left 1982   OTHER SURGICAL HISTORY  1992   stomach muscles removed due to Coumadin caused bleeding.  SKIN LESION EXCISION     Some cancerous   SKIN SURGERY  01/30/2022   Basal Cell on side of nose   TONSILLECTOMY  1965   Social History:   reports that he has never smoked. His smokeless tobacco use includes snuff. He reports that he does not currently use alcohol. He reports that he does not use drugs.  Family History  Problem Relation Age of Onset   Heart disease Neg Hx     Medications: Patient's Medications  New Prescriptions   No medications on file  Previous Medications   ACETAMINOPHEN (TYLENOL) 500 MG TABLET    Take 500 mg by mouth daily as  needed for moderate pain.   ALBUTEROL (VENTOLIN HFA) 108 (90 BASE) MCG/ACT INHALER    Inhale 1 puff into the lungs every 6 (six) hours as needed for wheezing or shortness of breath.   AMOXICILLIN (AMOXIL) 500 MG CAPSULE    Take 1,000 mg by mouth 2 (two) times daily. Prior to dental procedure   CETIRIZINE (ZYRTEC) 10 MG TABLET    Take 10 mg by mouth daily. Take 1 tablet by mouth daily for allergies.   CHOLECALCIFEROL (VITAMIN D-3 PO)    Take 1 tablet by mouth 2 (two) times daily. Dose unknown   DOCUSATE SODIUM (COLACE) 100 MG CAPSULE    Take 100 mg by mouth as needed for mild constipation.   FAMOTIDINE (PEPCID) 20 MG TABLET    Take 20 mg by mouth daily.   FLUTICASONE (FLONASE) 50 MCG/ACT NASAL SPRAY    PLACE 1 SPRAY INTO BOTH NOSTRILS TWO TIMES A DAY   FUROSEMIDE (LASIX) 20 MG TABLET    Take 1 tablet (20 mg total) by mouth daily as needed for fluid or edema (3lb weight gain).   IRON, FERROUS SULFATE, 325 (65 FE) MG TABS    Take 325 mg by mouth daily.   LOVASTATIN (MEVACOR) 40 MG TABLET    TAKE 1 TABLET BY MOUTH AT BEDTIME   PANTOPRAZOLE (PROTONIX) 40 MG TABLET    TAKE ONE TABLET BY MOUTH TWICE A DAY   PROPYLENE GLYCOL (SYSTANE BALANCE) 0.6 % SOLN    Apply 1 drop to eye as needed (for dry eye).   RAMIPRIL (ALTACE) 10 MG CAPSULE    Take 1 capsule (10 mg total) by mouth 2 (two) times daily.   TRAZODONE (DESYREL) 50 MG TABLET    Take 0.5 tablets (25 mg total) by mouth at bedtime.   VITAMIN B-12 (CYANOCOBALAMIN) 1000 MCG TABLET    Take 1,000 mcg by mouth daily.   WARFARIN (COUMADIN) 1 MG TABLET    TAKE 1/2 TABLET BY MOUTH DAILY WITH 3MG  TO TOTAL 3.5 MG   WARFARIN (COUMADIN) 3 MG TABLET    TAKE 1 TABLET BY MOUTH DAILY ALONG WITH 0.5 MILLIGRAM TABLET FOR A TOTAL DOSE OF 3.5 MILLIGRAMS DAILY  Modified Medications   No medications on file  Discontinued Medications   No medications on file    Physical Exam:  Vitals:   01/31/23 1000  BP: 126/88  Pulse: 77  Temp: 97.8 F (36.6 C)  TempSrc:  Temporal  SpO2: 98%  Weight: 184 lb (83.5 kg)  Height: 5\' 9"  (1.753 m)   Body mass index is 27.17 kg/m. Wt Readings from Last 3 Encounters:  01/31/23 184 lb (83.5 kg)  01/03/23 184 lb (83.5 kg)  12/06/22 183 lb (83 kg)    Physical Exam***  Labs reviewed: Basic Metabolic Panel: Recent Labs    03/08/22 1618 03/15/22  0830 11/03/22 1121  NA 138 138 138  K 4.5 4.7 4.5  CL 102 104 104  CO2 29 29 22   GLUCOSE 84 131* 99  BUN 19 14 18   CREATININE 1.25 1.37* 1.30*  CALCIUM 8.9 9.0 9.0   Liver Function Tests: Recent Labs    03/15/22 0830 11/03/22 1121  AST 14 17  ALT 15 14  BILITOT 0.4 0.3  PROT 5.5* 6.0*   No results for input(s): "LIPASE", "AMYLASE" in the last 8760 hours. No results for input(s): "AMMONIA" in the last 8760 hours. CBC: Recent Labs    03/08/22 1618 11/03/22 1121 01/03/23 1455  WBC 10.7 7.5 8.6  NEUTROABS 7,608 5,183 6,140  HGB 14.9 14.1 12.1*  HCT 44.9 41.7 37.1*  MCV 90.0 87.1 81.4  PLT 291 269 318   Lipid Panel: Recent Labs    03/15/22 0830 11/03/22 1121  CHOL 188 173  HDL 49 48  LDLCALC 107* 98  TRIG 202* 173*  CHOLHDL 3.8 3.6   TSH: No results for input(s): "TSH" in the last 8760 hours. A1C: No results found for: "HGBA1C"   Assessment/Plan 1. Long-term (current) use of anticoagulants, INR goal 2.5-3.5 ***   Return in about 4 weeks (around 02/28/2023) for INR .: ***  Arena Lindahl K. [place], [facility] [phone]

## 2023-02-01 LAB — CBC WITH DIFFERENTIAL/PLATELET
Absolute Monocytes: 882 cells/uL (ref 200–950)
Basophils Absolute: 50 cells/uL (ref 0–200)
Basophils Relative: 0.4 %
Eosinophils Absolute: 315 cells/uL (ref 15–500)
Eosinophils Relative: 2.5 %
HCT: 37.7 % — ABNORMAL LOW (ref 38.5–50.0)
Hemoglobin: 12.3 g/dL — ABNORMAL LOW (ref 13.2–17.1)
Lymphs Abs: 1108.8 cells/uL (ref 850–3900)
MCH: 27.2 pg (ref 27.0–33.0)
MCHC: 32.6 g/dL (ref 32.0–36.0)
MCV: 83.2 fL (ref 80.0–100.0)
MPV: 10.6 fL (ref 7.5–12.5)
Monocytes Relative: 7 %
Neutro Abs: 10244 cells/uL — ABNORMAL HIGH (ref 1500–7800)
Neutrophils Relative %: 81.3 %
Platelets: 307 10*3/uL (ref 140–400)
RBC: 4.53 10*6/uL (ref 4.20–5.80)
RDW: 15.1 % — ABNORMAL HIGH (ref 11.0–15.0)
Total Lymphocyte: 8.8 %
WBC: 12.6 10*3/uL — ABNORMAL HIGH (ref 3.8–10.8)

## 2023-02-01 LAB — BASIC METABOLIC PANEL WITH GFR
BUN/Creatinine Ratio: 16 (calc) (ref 6–22)
BUN: 21 mg/dL (ref 7–25)
CO2: 26 mmol/L (ref 20–32)
Calcium: 9 mg/dL (ref 8.6–10.3)
Chloride: 102 mmol/L (ref 98–110)
Creat: 1.32 mg/dL — ABNORMAL HIGH (ref 0.70–1.28)
Glucose, Bld: 94 mg/dL (ref 65–99)
Potassium: 4.6 mmol/L (ref 3.5–5.3)
Sodium: 135 mmol/L (ref 135–146)
eGFR: 56 mL/min/{1.73_m2} — ABNORMAL LOW (ref >=60)

## 2023-02-01 LAB — TSH: TSH: 0.97 mIU/L (ref 0.40–4.50)

## 2023-02-03 ENCOUNTER — Ambulatory Visit: Payer: Medicare Other

## 2023-02-25 ENCOUNTER — Other Ambulatory Visit: Payer: Self-pay | Admitting: Nurse Practitioner

## 2023-02-25 DIAGNOSIS — J309 Allergic rhinitis, unspecified: Secondary | ICD-10-CM

## 2023-02-28 ENCOUNTER — Ambulatory Visit (INDEPENDENT_AMBULATORY_CARE_PROVIDER_SITE_OTHER): Payer: Medicare Other | Admitting: Nurse Practitioner

## 2023-02-28 ENCOUNTER — Encounter: Payer: Self-pay | Admitting: Nurse Practitioner

## 2023-02-28 VITALS — BP 126/72 | HR 72 | Temp 97.5°F | Ht 69.0 in | Wt 184.5 lb

## 2023-02-28 DIAGNOSIS — Z952 Presence of prosthetic heart valve: Secondary | ICD-10-CM

## 2023-02-28 DIAGNOSIS — Z7901 Long term (current) use of anticoagulants: Secondary | ICD-10-CM | POA: Diagnosis not present

## 2023-02-28 DIAGNOSIS — D6869 Other thrombophilia: Secondary | ICD-10-CM

## 2023-02-28 LAB — POCT INR: INR: 4.2 — AB (ref 2.0–3.0)

## 2023-02-28 MED ORDER — WARFARIN SODIUM 3 MG PO TABS
ORAL_TABLET | ORAL | 11 refills | Status: DC
Start: 2023-02-28 — End: 2023-10-20

## 2023-02-28 MED ORDER — WARFARIN SODIUM 1 MG PO TABS
ORAL_TABLET | ORAL | 6 refills | Status: DC
Start: 2023-02-28 — End: 2023-09-19

## 2023-02-28 NOTE — Progress Notes (Signed)
Careteam: Patient Care Team: Garrett Seller, NP as PCP - General (Geriatric Medicine)  PLACE OF SERVICE:  [facility]  Advanced Directive information    Allergies  Allergen Reactions   Aspirin     Other reaction(s): Other (See Comments) Can't take due to taking blood thinners   Morphine And Related Other (See Comments)    Other reaction(s): Other (See Comments) Hallucinations     Nitroglycerin     Severe headache   Promethazine Other (See Comments)    Other reaction(s): Other (See Comments) Hallucinations    Promethazine Hcl Other (See Comments)    Chief Complaint  Patient presents with   Anticoagulation    4 week PT/INR check      HPI: [name] for INR check.  INR today is 4.2 He is taking 3.5 mg daily except 4 mg on every 3rd Wednesday.  When he was only taking 3.5 mg daily INR was too low.  No abnormal bruising or bleeding.  No changes in medication.  Has a headache today with all the allergies  Review of Systems:  Review of Systems  Constitutional:  Negative for chills, fever and weight loss.  HENT:  Negative for tinnitus.   Respiratory:  Negative for cough, sputum production and shortness of breath.   Cardiovascular:  Negative for chest pain, palpitations and leg swelling.  Gastrointestinal:  Negative for abdominal pain, constipation, diarrhea and heartburn.  Genitourinary:  Negative for dysuria, frequency and urgency.  Musculoskeletal:  Negative for back pain, falls, joint pain and myalgias.  Skin: Negative.   Neurological:  Negative for dizziness and headaches.  Psychiatric/Behavioral:  Negative for depression and memory loss. The patient does not have insomnia.     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   Right Alar Crease   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   Right Temple (in situ) (curet and 5FU)   Past Surgical History:  Procedure Laterality Date   AORTIC VALVE REPLACEMENT     mechanical   BIOPSY  03/28/2021   Procedure: BIOPSY;  Surgeon: Garrett Redbird, MD;  Location: [facility];  Service: Endoscopy;;   BLADDER SURGERY  1983   Bladder Cancer Surgery    ESOPHAGOGASTRODUODENOSCOPY (EGD) WITH PROPOFOL N/A 03/28/2021   Procedure: ESOPHAGOGASTRODUODENOSCOPY (EGD) WITH PROPOFOL;  Surgeon: Garrett Redbird, MD;  Location: [facility];  Service: Endoscopy;  Laterality: N/A;   KNEE SURGERY Right 1980   KNEE SURGERY Left 1982   OTHER SURGICAL HISTORY  1992   stomach muscles removed due to Coumadin caused bleeding.   SKIN LESION EXCISION     Some cancerous   SKIN SURGERY  01/30/2022   Basal Cell on side of nose   TONSILLECTOMY  1965   Social History:   reports that he  has never smoked. His smokeless tobacco use includes snuff. He reports that he does not currently use alcohol. He reports that he does not use drugs.  Family History  Problem Relation Age of Onset   Heart disease Neg Hx     Medications: Patient's Medications  New Prescriptions   No medications on file  Previous Medications   ACETAMINOPHEN (TYLENOL) 500 MG TABLET    Take 500 mg by mouth daily as needed for moderate pain.   ALBUTEROL (VENTOLIN HFA) 108 (90 BASE) MCG/ACT INHALER    Inhale 1 puff into the lungs every 6 (six) hours as needed for wheezing or shortness of breath.   AMOXICILLIN (AMOXIL) 500 MG CAPSULE     Take 1,000 mg by mouth 2 (two) times daily. Prior to dental procedure   CETIRIZINE (ZYRTEC) 10 MG TABLET    Take 10 mg by mouth daily. Take 1 tablet by mouth daily for allergies.   CHOLECALCIFEROL (VITAMIN D-3 PO)    Take 1 tablet by mouth 2 (two) times daily. Dose unknown   DOCUSATE SODIUM (COLACE) 100 MG CAPSULE    Take 100 mg by mouth as needed for mild constipation.   FAMOTIDINE (PEPCID) 20 MG TABLET    Take 20 mg by mouth daily.   FLUTICASONE (FLONASE) 50 MCG/ACT NASAL SPRAY    PLACE 1 SPRAY INTO BOTH NOSTRILS DAILY   FUROSEMIDE (LASIX) 20 MG TABLET    Take 1 tablet (20 mg total) by mouth daily as needed for fluid or edema (3lb weight gain).   IRON, FERROUS SULFATE, 325 (65 FE) MG TABS    Take 325 mg by mouth daily.   LOVASTATIN (MEVACOR) 40 MG TABLET    TAKE 1 TABLET BY MOUTH AT BEDTIME   PANTOPRAZOLE (PROTONIX) 40 MG TABLET    TAKE ONE TABLET BY MOUTH TWICE A DAY   PROPYLENE GLYCOL (SYSTANE BALANCE) 0.6 % SOLN    Apply 1 drop to eye as needed (for dry eye).   RAMIPRIL (ALTACE) 10 MG CAPSULE    Take 1 capsule (10 mg total) by mouth 2 (two) times daily.   TRAZODONE (DESYREL) 50 MG TABLET    Take 0.5 tablets (25 mg total) by mouth at bedtime.   VITAMIN B-12 (CYANOCOBALAMIN) 1000 MCG TABLET    Take 1,000 mcg by mouth daily.   WARFARIN (COUMADIN) 1 MG TABLET    TAKE 1/2 TABLET BY MOUTH DAILY WITH 3MG  TO TOTAL 3.5 MG   WARFARIN (COUMADIN) 3 MG TABLET    TAKE 1 TABLET BY MOUTH DAILY ALONG WITH 0.5 MILLIGRAM TABLET FOR A TOTAL DOSE OF 3.5 MILLIGRAMS DAILY  Modified Medications   No medications on file  Discontinued Medications   No medications on file    Physical Exam:  Vitals:   02/28/23 1318  BP: 126/72  Pulse: 72  Temp: (!) 97.5 F (36.4 C)  SpO2: 96%  Weight: 184 lb 8 oz (83.7 kg)  Height: 5\' 9"  (1.753 m)   Body mass index is 27.25 kg/m. Wt Readings from Last 3 Encounters:  02/28/23 184 lb 8 oz (83.7 kg)  01/31/23 184 lb (83.5 kg)  01/03/23 184 lb (83.5 kg)    Physical  Exam Constitutional:      General: He is not in acute distress.    Appearance: He is well-developed. He is not diaphoretic.  HENT:     Head: Normocephalic and atraumatic.     Right Ear: External ear normal.     Left Ear: External ear normal.  Mouth/Throat:     Pharynx: No oropharyngeal exudate.  Eyes:     Conjunctiva/sclera: Conjunctivae normal.     Pupils: Pupils are equal, round, and reactive to light.  Cardiovascular:     Rate and Rhythm: Normal rate and regular rhythm.     Heart sounds: Murmur heard.  Pulmonary:     Effort: Pulmonary effort is normal.     Breath sounds: Normal breath sounds.  Abdominal:     General: Bowel sounds are normal.     Palpations: Abdomen is soft.  Musculoskeletal:        General: No tenderness.     Cervical back: Normal range of motion and neck supple.     Right lower leg: No edema.     Left lower leg: No edema.  Skin:    General: Skin is warm and dry.  Neurological:     Mental Status: He is alert and oriented to person, place, and time.     Labs reviewed: Basic Metabolic Panel: Recent Labs    03/15/22 0830 11/03/22 1121 01/31/23 1350  NA 138 138 135  K 4.7 4.5 4.6  CL 104 104 102  CO2 29 22 26   GLUCOSE 131* 99 94  BUN 14 18 21   CREATININE 1.37* 1.30* 1.32*  CALCIUM 9.0 9.0 9.0  TSH  --   --  0.97   Liver Function Tests: Recent Labs    03/15/22 0830 11/03/22 1121  AST 14 17  ALT 15 14  BILITOT 0.4 0.3  PROT 5.5* 6.0*   No results for input(s): "LIPASE", "AMYLASE" in the last 8760 hours. No results for input(s): "AMMONIA" in the last 8760 hours. CBC: Recent Labs    11/03/22 1121 01/03/23 1455 01/31/23 1350  WBC 7.5 8.6 12.6*  NEUTROABS 5,183 6,140 10,244*  HGB 14.1 12.1* 12.3*  HCT 41.7 37.1* 37.7*  MCV 87.1 81.4 83.2  PLT 269 318 307   Lipid Panel: Recent Labs    03/15/22 0830 11/03/22 1121  CHOL 188 173  HDL 49 48  LDLCALC 107* 98  TRIG 202* 173*  CHOLHDL 3.8 3.6   TSH: Recent Labs     01/31/23 1350  TSH 0.97   A1C: No results found for: "HGBA1C"   Assessment/Plan 1. Long-term (current) use of anticoagulants, INR goal 3-3.5 -inr today at 4.2, have tried placing him on coumadin 3.5 mg daily multiple times and this does not get him to goal. He currently takes 4 mg every 3rd Wednesday with 3.5 mg all other days and INR elevated at 4.2, will have him decrease his every 3rd Wednesday dose to 3.75 mg and continue 3.5 mg all other days.  - POC INR - warfarin (COUMADIN) 1 MG tablet; TAKE 1/2 TABLET BY MOUTH DAILY WITH 3MG  TO TOTAL 3.5 MG except every 3rd Wednesday take additional 1/4 tablet to equal 3.75  Dispense: 30 tablet; Refill: 6 - warfarin (COUMADIN) 3 MG tablet; TAKE 1 TABLET BY MOUTH DAILY ALONG WITH 0.5 MILLIGRAM TABLET FOR A TOTAL DOSE OF 3.5 MILLIGRAMS DAILY except every 3rd Wednesday 3.75 mg daily  Dispense: 30 tablet; Refill: 11  2. 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 H/O aortic valve replacement INR GOAL 3-3.5, continue coumadin daily - warfarin (COUMADIN) 3 MG tablet; TAKE 1 TABLET  BY MOUTH DAILY ALONG WITH 0.5 MILLIGRAM TABLET FOR A TOTAL DOSE OF 3.5 MILLIGRAMS DAILY except every 3rd Wednesday 3.75 mg daily  Dispense: 30 tablet; Refill: 11   04/01/2023 for INR check Garrett Terry K. Biagio Borg [facility] [phone]

## 2023-02-28 NOTE — Patient Instructions (Signed)
Start 3.5 mg daily except 3.75 mg every 3rd Wednesday   Follow up INR in 4 weeks.

## 2023-03-29 ENCOUNTER — Encounter: Payer: Self-pay | Admitting: Nurse Practitioner

## 2023-04-01 ENCOUNTER — Ambulatory Visit (INDEPENDENT_AMBULATORY_CARE_PROVIDER_SITE_OTHER): Payer: Medicare Other | Admitting: Nurse Practitioner

## 2023-04-01 ENCOUNTER — Encounter: Payer: Self-pay | Admitting: Nurse Practitioner

## 2023-04-01 VITALS — BP 136/78 | HR 78 | Temp 97.1°F | Ht 69.0 in | Wt 174.0 lb

## 2023-04-01 DIAGNOSIS — Z952 Presence of prosthetic heart valve: Secondary | ICD-10-CM

## 2023-04-01 DIAGNOSIS — Z7901 Long term (current) use of anticoagulants: Secondary | ICD-10-CM | POA: Diagnosis not present

## 2023-04-01 DIAGNOSIS — D6869 Other thrombophilia: Secondary | ICD-10-CM

## 2023-04-01 DIAGNOSIS — F5101 Primary insomnia: Secondary | ICD-10-CM | POA: Diagnosis not present

## 2023-04-01 LAB — POCT INR: INR: 3.2 — AB (ref 2.0–3.0)

## 2023-04-01 NOTE — Progress Notes (Signed)
Careteam: Patient Care Team: Sharon Seller, NP as PCP - General (Geriatric Medicine)  PLACE OF SERVICE:  [facility]  Advanced Directive information Does Patient Have a Medical Advance Directive?: Yes, Type of Advance Directive: Healthcare Power of [place];Living will;Out of facility DNR (pink MOST or yellow form), Pre-existing out of facility DNR order (yellow form or pink MOST form): Yellow form placed in chart (order not valid for inpatient use), Does patient want to make changes to medical advance directive?: No - Patient declined  Allergies  Allergen Reactions   Aspirin     Other reaction(s): Other (See Comments) Can't take due to taking blood thinners   Morphine And Codeine Other (See Comments)    Other reaction(s): Other (See Comments) Hallucinations     Nitroglycerin     Severe headache   Promethazine Other (See Comments)    Other reaction(s): Other (See Comments) Hallucinations    Promethazine Hcl Other (See Comments)    Chief Complaint  Patient presents with   Anticoagulation    PT/INR check. Discuss need for shingrix and covid boosters. No missed doses and no unusual bleeding      HPI: [name] for INR check.   He is at goal today. Took coumadin 3.75 mg 2 days ago. Taking 3.5 mg daily but 3.75mg  every 3rd Wednesday. No changes in medications or diet.  No abnormal bruising or bleeding noted.   He has lost weigh due to eating less.   He wants to increase trazodone due to not being able to fall asleep. Mind is going and can not fall asleep also has trouble staying asleep.   Also having some hip and shoulder pain. Followed by orthopedic.   Review of Systems:  Review of Systems  Constitutional:  Negative for chills, fever and weight loss.  HENT:  Negative for tinnitus.   Respiratory:  Negative for cough, sputum production and shortness of breath.   Cardiovascular:  Negative for chest pain, palpitations and leg swelling.   Gastrointestinal:  Negative for abdominal pain, constipation, diarrhea and heartburn.  Genitourinary:  Negative for dysuria, frequency and urgency.  Musculoskeletal:  Negative for back pain, falls, joint pain and myalgias.  Skin: Negative.   Neurological:  Negative for dizziness and headaches.  Psychiatric/Behavioral:  Negative for depression and memory loss. The patient has insomnia.     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   Right Alar Crease   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   Right Temple (in situ) (curet and 5FU)   Past Surgical History:  Procedure Laterality Date   AORTIC VALVE REPLACEMENT     mechanical   BIOPSY  03/28/2021   Procedure: BIOPSY;  Surgeon: Bernette Redbird, MD;  Location: [facility];  Service: Endoscopy;;   BLADDER SURGERY  1983   Bladder Cancer Surgery    ESOPHAGOGASTRODUODENOSCOPY (EGD) WITH PROPOFOL N/A 03/28/2021   Procedure: ESOPHAGOGASTRODUODENOSCOPY (EGD) WITH PROPOFOL;  Surgeon: Bernette Redbird, MD;  Location: [facility];  Service: Endoscopy;  Laterality: N/A;   KNEE SURGERY Right 1980   KNEE SURGERY Left 1982   OTHER SURGICAL HISTORY  1992   stomach muscles removed due to Coumadin caused bleeding.   SKIN LESION EXCISION     Some cancerous   SKIN SURGERY  01/30/2022   Basal Cell on side of nose   TONSILLECTOMY  1965   Social  History:   reports that he has never smoked. His smokeless tobacco use includes snuff. He reports that he does not currently use alcohol. He reports that he does not use drugs.  Family History  Problem Relation Age of Onset   Heart disease Neg Hx     Medications: Patient's Medications  New Prescriptions   No medications on file  Previous Medications   ACETAMINOPHEN (TYLENOL) 500 MG TABLET    Take 500 mg by mouth daily as needed for moderate pain.   ALBUTEROL (VENTOLIN HFA) 108 (90 BASE) MCG/ACT INHALER    Inhale 1 puff into the lungs every 6 (six) hours as needed for wheezing or shortness of breath.   AMOXICILLIN (AMOXIL) 500 MG CAPSULE    Take 1,000 mg by mouth 2 (two) times daily. Prior to dental procedure   CETIRIZINE (ZYRTEC) 10 MG TABLET    Take 10 mg by mouth daily.   CHOLECALCIFEROL (VITAMIN D-3 PO)    Take 1 tablet by mouth daily. Dose unknown   DOCUSATE SODIUM (COLACE) 100 MG CAPSULE    Take 100 mg by mouth as needed for mild constipation.   FAMOTIDINE (PEPCID) 20 MG TABLET    Take 20 mg by mouth daily.   FLUTICASONE (FLONASE) 50 MCG/ACT NASAL SPRAY    PLACE 1 SPRAY INTO BOTH NOSTRILS DAILY   FUROSEMIDE (LASIX) 20 MG TABLET    Take 1 tablet (20 mg total) by mouth daily as needed for fluid or edema (3lb weight gain).   IRON, FERROUS SULFATE, 325 (65 FE) MG TABS    Take 325 mg by mouth daily.   LOVASTATIN (MEVACOR) 40 MG TABLET    TAKE 1 TABLET BY MOUTH AT BEDTIME   PANTOPRAZOLE (PROTONIX) 40 MG TABLET    TAKE ONE TABLET BY MOUTH TWICE A DAY   PROPYLENE GLYCOL (SYSTANE BALANCE) 0.6 % SOLN    Apply 1 drop to eye as needed (for dry eye).   RAMIPRIL (ALTACE) 10 MG CAPSULE    Take 1 capsule (10 mg total) by mouth 2 (two) times daily.   TRAZODONE (DESYREL) 100 MG TABLET    Take 25 mg by mouth at bedtime.   VITAMIN B-12 (CYANOCOBALAMIN) 1000 MCG TABLET    Take 1,000 mcg by mouth daily.   WARFARIN (COUMADIN) 1 MG TABLET    TAKE 1/2 TABLET BY MOUTH DAILY WITH 3MG  TO TOTAL 3.5 MG except  every 3rd Wednesday take additional 1/4 tablet to equal 3.75   WARFARIN (COUMADIN) 3 MG TABLET    TAKE 1 TABLET BY MOUTH DAILY ALONG WITH 0.5 MILLIGRAM TABLET FOR A TOTAL DOSE OF 3.5 MILLIGRAMS DAILY except every 3rd Wednesday 3.75 mg daily  Modified Medications   No medications on file  Discontinued Medications   TRAZODONE (DESYREL) 50 MG TABLET    Take 0.5 tablets (25 mg total) by mouth at bedtime.    Physical Exam:  Vitals:  04/01/23 1352  BP: 136/78  Pulse: 78  Temp: (!) 97.1 F (36.2 C)  TempSrc: Temporal  SpO2: 99%  Weight: 174 lb (78.9 kg)  Height: 5\' 9"  (1.753 m)   Body mass index is 25.7 kg/m. Wt Readings from Last 3 Encounters:  04/01/23 174 lb (78.9 kg)  02/28/23 184 lb 8 oz (83.7 kg)  01/31/23 184 lb (83.5 kg)    Physical Exam Constitutional:      General: He is not in acute distress.    Appearance: He is well-developed. He is not diaphoretic.  HENT:     Head: Normocephalic and atraumatic.     Right Ear: External ear normal.     Left Ear: External ear normal.     Mouth/Throat:     Pharynx: No oropharyngeal exudate.  Eyes:     Conjunctiva/sclera: Conjunctivae normal.     Pupils: Pupils are equal, round, and reactive to light.  Cardiovascular:     Rate and Rhythm: Normal rate and regular rhythm.     Heart sounds: Murmur heard.  Pulmonary:     Effort: Pulmonary effort is normal.     Breath sounds: Normal breath sounds.  Abdominal:     General: Bowel sounds are normal.     Palpations: Abdomen is soft.  Musculoskeletal:        General: No tenderness.     Cervical back: Normal range of motion and neck supple.     Right lower leg: No edema.     Left lower leg: No edema.  Skin:    General: Skin is warm and dry.  Neurological:     Mental Status: He is alert and oriented to person, place, and time.     Labs reviewed: Basic Metabolic Panel: Recent Labs    11/03/22 1121 01/31/23 1350  NA 138 135  K 4.5 4.6  CL 104 102  CO2 22 26  GLUCOSE 99 94   BUN 18 21  CREATININE 1.30* 1.32*  CALCIUM 9.0 9.0  TSH  --  0.97   Liver Function Tests: Recent Labs    11/03/22 1121  AST 17  ALT 14  BILITOT 0.3  PROT 6.0*   No results for input(s): "LIPASE", "AMYLASE" in the last 8760 hours. No results for input(s): "AMMONIA" in the last 8760 hours. CBC: Recent Labs    11/03/22 1121 01/03/23 1455 01/31/23 1350  WBC 7.5 8.6 12.6*  NEUTROABS 5,183 6,140 10,244*  HGB 14.1 12.1* 12.3*  HCT 41.7 37.1* 37.7*  MCV 87.1 81.4 83.2  PLT 269 318 307   Lipid Panel: Recent Labs    11/03/22 1121  CHOL 173  HDL 48  LDLCALC 98  TRIG 173*  CHOLHDL 3.6   TSH: Recent Labs    01/31/23 1350  TSH 0.97   A1C: No results found for: "HGBA1C"   Assessment/Plan 1. Long-term (current) use of anticoagulants, INR goal  Due to valve replacement goal is 3-3.5 - POC INR today at 3.2, to continue current regimen.   2. Acquired thrombophilia (HCC) Due to valve replacement, on coumadin for anticoagulation. Will continue current dose   3. H/O aortic valve replacement Stable, without worsening edema, chest pain or shortness of breath  4. Primary insomnia Continues on trazodone, discussed CBT to help with methods to help fall asleep.   Return in about 4 weeks (around 04/29/2023) for INR check .  Janene Harvey. Biagio Borg [facility] [phone]

## 2023-04-01 NOTE — Patient Instructions (Signed)
Continue current coumadin dose

## 2023-04-11 ENCOUNTER — Other Ambulatory Visit: Payer: Self-pay | Admitting: Nurse Practitioner

## 2023-04-29 ENCOUNTER — Encounter: Payer: Self-pay | Admitting: Nurse Practitioner

## 2023-04-29 ENCOUNTER — Ambulatory Visit (HOSPITAL_COMMUNITY)
Admission: RE | Admit: 2023-04-29 | Discharge: 2023-04-29 | Disposition: A | Discharge status: Home / Self Care | Payer: Medicare Other | Source: Ambulatory Visit | Attending: Cardiovascular Disease | Admitting: Cardiovascular Disease

## 2023-04-29 ENCOUNTER — Ambulatory Visit (INDEPENDENT_AMBULATORY_CARE_PROVIDER_SITE_OTHER): Payer: Medicare Other | Admitting: Nurse Practitioner

## 2023-04-29 VITALS — BP 114/78 | HR 54 | Temp 97.3°F | Ht 69.0 in | Wt 184.0 lb

## 2023-04-29 DIAGNOSIS — D6869 Other thrombophilia: Secondary | ICD-10-CM | POA: Diagnosis not present

## 2023-04-29 DIAGNOSIS — I6529 Occlusion and stenosis of unspecified carotid artery: Secondary | ICD-10-CM | POA: Diagnosis present

## 2023-04-29 DIAGNOSIS — Z952 Presence of prosthetic heart valve: Secondary | ICD-10-CM

## 2023-04-29 DIAGNOSIS — I6521 Occlusion and stenosis of right carotid artery: Secondary | ICD-10-CM | POA: Insufficient documentation

## 2023-04-29 DIAGNOSIS — Z7901 Long term (current) use of anticoagulants: Secondary | ICD-10-CM

## 2023-04-29 LAB — POCT INR: INR: 4.1 — AB (ref 2.0–3.0)

## 2023-04-29 NOTE — Progress Notes (Signed)
Careteam: Patient Care Team: Sharon Seller, NP as PCP - General (Geriatric Medicine)  PLACE OF SERVICE:  [facility]  Advanced Directive information Does Patient Have a Medical Advance Directive?: Yes, Type of Advance Directive: Healthcare Power of [place];Living will;Out of facility DNR (pink MOST or yellow form), Pre-existing out of facility DNR order (yellow form or pink MOST form): Yellow form placed in chart (order not valid for inpatient use), Does patient want to make changes to medical advance directive?: No - Patient declined  Allergies  Allergen Reactions   Aspirin     Other reaction(s): Other (See Comments) Can't take due to taking blood thinners   Morphine And Codeine Other (See Comments)    Other reaction(s): Other (See Comments) Hallucinations     Nitroglycerin     Severe headache   Promethazine Other (See Comments)    Other reaction(s): Other (See Comments) Hallucinations    Promethazine Hcl Other (See Comments)    Chief Complaint  Patient presents with   Anticoagulation    PT/INR check. [facility] verified. Missed evening pills x once a week ago. No bleeding.      HPI: [name] for INR check.  GOAL 3-3.5 INR- 4.1 He did miss a dose 1 week ago.  No other medication changes, or dietary changes No bleeding or bruising.  No shortness of breath or chest pain.  He is on 3.5 mg daily except 3.75 every 3rd Wednesday and this was last week.   Lab Results  Component Value Date   INR 4.1 (A) 04/29/2023   INR 3.2 (A) 04/01/2023   INR 4.2 (A) 02/28/2023    Review of Systems:  Review of Systems  Constitutional:  Negative for chills, fever and weight loss.  HENT:  Positive for hearing loss. Negative for tinnitus.   Respiratory:  Negative for cough, sputum production and shortness of breath.   Cardiovascular:  Negative for chest pain, palpitations and leg swelling.  Gastrointestinal:  Negative for abdominal pain, constipation, diarrhea  and heartburn.  Genitourinary:  Negative for dysuria, frequency and urgency.  Musculoskeletal:  Negative for back pain, falls, joint pain and myalgias.  Skin: Negative.   Neurological:  Negative for dizziness and headaches.  Psychiatric/Behavioral:  Negative for depression and memory loss. The patient does not have insomnia.     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   Right Alar Crease   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   Right Temple (in situ) (curet and 5FU)   Past Surgical History:  Procedure Laterality Date   AORTIC VALVE REPLACEMENT     mechanical   BIOPSY  03/28/2021   Procedure: BIOPSY;  Surgeon: Bernette Redbird, MD;  Location: [facility];  Service: Endoscopy;;   BLADDER SURGERY  1983   Bladder  Cancer Surgery    ESOPHAGOGASTRODUODENOSCOPY (EGD) WITH PROPOFOL N/A 03/28/2021   Procedure: ESOPHAGOGASTRODUODENOSCOPY (EGD) WITH PROPOFOL;  Surgeon: Bernette Redbird, MD;  Location: [facility];  Service: Endoscopy;  Laterality: N/A;   KNEE SURGERY Right 1980   KNEE SURGERY Left 1982   OTHER SURGICAL HISTORY  1992   stomach muscles removed due to Coumadin caused bleeding.   SKIN LESION EXCISION     Some cancerous   SKIN SURGERY  01/30/2022   Basal Cell on side of nose   TONSILLECTOMY  1965   Social History:   reports that he has never smoked. His smokeless  tobacco use includes snuff. He reports that he does not currently use alcohol. He reports that he does not use drugs.  Family History  Problem Relation Age of Onset   Heart disease Neg Hx     Medications: Patient's Medications  New Prescriptions   No medications on file  Previous Medications   ACETAMINOPHEN (TYLENOL) 500 MG TABLET    Take 500 mg by mouth daily as needed for moderate pain.   ALBUTEROL (VENTOLIN HFA) 108 (90 BASE) MCG/ACT INHALER    Inhale 1 puff into the lungs every 6 (six) hours as needed for wheezing or shortness of breath.   AMOXICILLIN (AMOXIL) 500 MG CAPSULE    Take 1,000 mg by mouth 2 (two) times daily. Prior to dental procedure   CETIRIZINE (ZYRTEC) 10 MG TABLET    Take 10 mg by mouth daily.   CHOLECALCIFEROL (VITAMIN D-3 PO)    Take 1 tablet by mouth daily. Dose unknown   DOCUSATE SODIUM (COLACE) 100 MG CAPSULE    Take 100 mg by mouth as needed for mild constipation.   FAMOTIDINE (PEPCID) 20 MG TABLET    Take 20 mg by mouth daily.   FLUTICASONE (FLONASE) 50 MCG/ACT NASAL SPRAY    PLACE 1 SPRAY INTO BOTH NOSTRILS DAILY   FUROSEMIDE (LASIX) 20 MG TABLET    Take 1 tablet (20 mg total) by mouth daily as needed for fluid or edema (3lb weight gain).   IRON, FERROUS SULFATE, 325 (65 FE) MG TABS    Take 325 mg by mouth daily.   LOVASTATIN (MEVACOR) 40 MG TABLET    TAKE 1 TABLET BY MOUTH AT BEDTIME   PANTOPRAZOLE (PROTONIX) 40 MG TABLET    TAKE ONE TABLET BY MOUTH TWICE A DAY   PROPYLENE GLYCOL (SYSTANE BALANCE) 0.6 % SOLN    Apply 1 drop to eye as needed (for dry eye).   RAMIPRIL (ALTACE) 10 MG CAPSULE    Take 1 capsule (10 mg total) by mouth 2 (two) times daily.   TRAZODONE (DESYREL) 100 MG TABLET    Take 25 mg by mouth at bedtime.   VITAMIN B-12 (CYANOCOBALAMIN) 1000 MCG TABLET    Take 1,000 mcg by mouth daily.   WARFARIN (COUMADIN) 1 MG TABLET    TAKE 1/2 TABLET BY MOUTH DAILY WITH 3MG  TO TOTAL 3.5 MG except every 3rd Wednesday take additional 1/4 tablet to equal 3.75    WARFARIN (COUMADIN) 3 MG TABLET    TAKE 1 TABLET BY MOUTH DAILY ALONG WITH 0.5 MILLIGRAM TABLET FOR A TOTAL DOSE OF 3.5 MILLIGRAMS DAILY except every 3rd Wednesday 3.75 mg daily  Modified Medications   No medications on file  Discontinued Medications   No medications on file    Physical Exam:  Vitals:   04/29/23 1123  BP: 114/78  Pulse: (!) 54  Temp: (!) 97.3 F (36.3 C)  TempSrc:  Temporal  SpO2: 98%  Weight: 184 lb (83.5 kg)  Height: 5\' 9"  (1.753 m)   Body mass index is 27.17 kg/m. Wt Readings from Last 3 Encounters:  04/29/23 184 lb (83.5 kg)  04/01/23 174 lb (78.9 kg)  02/28/23 184 lb 8 oz (83.7 kg)    Physical Exam Constitutional:      General: He is not in acute distress.    Appearance: He is well-developed. He is not diaphoretic.  HENT:     Head: Normocephalic and atraumatic.     Right Ear: External ear normal. There is no impacted cerumen.     Left Ear: External ear normal. There is no impacted cerumen.     Mouth/Throat:     Pharynx: No oropharyngeal exudate.  Eyes:     Conjunctiva/sclera: Conjunctivae normal.     Pupils: Pupils are equal, round, and reactive to light.  Cardiovascular:     Rate and Rhythm: Normal rate and regular rhythm.     Heart sounds: Murmur heard.  Pulmonary:     Effort: Pulmonary effort is normal.     Breath sounds: Normal breath sounds.  Abdominal:     General: Bowel sounds are normal.     Palpations: Abdomen is soft.  Musculoskeletal:        General: No tenderness.     Cervical back: Normal range of motion and neck supple.     Right lower leg: No edema.     Left lower leg: No edema.  Skin:    General: Skin is warm and dry.  Neurological:     Mental Status: He is alert and oriented to person, place, and time.     Labs reviewed: Basic Metabolic Panel: Recent Labs    11/03/22 1121 01/31/23 1350  NA 138 135  K 4.5 4.6  CL 104 102  CO2 22 26  GLUCOSE 99 94  BUN 18 21  CREATININE 1.30* 1.32*  CALCIUM 9.0 9.0  TSH   --  0.97   Liver Function Tests: Recent Labs    11/03/22 1121  AST 17  ALT 14  BILITOT 0.3  PROT 6.0*   No results for input(s): "LIPASE", "AMYLASE" in the last 8760 hours. No results for input(s): "AMMONIA" in the last 8760 hours. CBC: Recent Labs    11/03/22 1121 01/03/23 1455 01/31/23 1350  WBC 7.5 8.6 12.6*  NEUTROABS 5,183 6,140 10,244*  HGB 14.1 12.1* 12.3*  HCT 41.7 37.1* 37.7*  MCV 87.1 81.4 83.2  PLT 269 318 307   Lipid Panel: Recent Labs    11/03/22 1121  CHOL 173  HDL 48  LDLCALC 98  TRIG 173*  CHOLHDL 3.6   TSH: Recent Labs    01/31/23 1350  TSH 0.97   A1C: No results found for: "HGBA1C"   Assessment/Plan 1. Long-term (current) use of anticoagulants, INR goal 3-3.5 - POC INR at 4.1, goal between 3-3.5, will have him reduce coumadin to 3 mg daily for 3 days then resume 3.5 mg daily dosing  2. H/O aortic valve replacement On coumadin for anticoagulation.   3. 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   Janene Harvey. Biagio Borg [facility] [phone]

## 2023-04-29 NOTE — Patient Instructions (Addendum)
INR today 4.1, decrease coumadin to 3 mg daily for 3 days then on Monday resume previous dosing- 3.5 mg daily Then we will see you in office before your next 3.75 mg dosing is due.

## 2023-05-04 ENCOUNTER — Other Ambulatory Visit: Payer: Self-pay | Admitting: *Deleted

## 2023-05-04 DIAGNOSIS — E041 Nontoxic single thyroid nodule: Secondary | ICD-10-CM

## 2023-05-09 ENCOUNTER — Encounter: Payer: Self-pay | Admitting: Nurse Practitioner

## 2023-05-09 ENCOUNTER — Ambulatory Visit (INDEPENDENT_AMBULATORY_CARE_PROVIDER_SITE_OTHER): Payer: Medicare Other | Admitting: Nurse Practitioner

## 2023-05-09 VITALS — BP 112/62 | HR 59 | Temp 97.1°F | Resp 18 | Ht 69.0 in | Wt 182.0 lb

## 2023-05-09 DIAGNOSIS — Z952 Presence of prosthetic heart valve: Secondary | ICD-10-CM | POA: Diagnosis not present

## 2023-05-09 DIAGNOSIS — Z7901 Long term (current) use of anticoagulants: Secondary | ICD-10-CM | POA: Diagnosis not present

## 2023-05-09 DIAGNOSIS — D6869 Other thrombophilia: Secondary | ICD-10-CM

## 2023-05-09 LAB — POCT INR: INR: 3.5 — AB (ref 2.0–3.0)

## 2023-05-09 NOTE — Patient Instructions (Signed)
To hold off until next Wednesday for the 3.75 mg tablet.  Then continue the 3/75 mg every 3rd Wednesday.

## 2023-05-09 NOTE — Progress Notes (Unsigned)
Careteam: Patient Care Team: Sharon Seller, NP as PCP - General (Geriatric Medicine)  PLACE OF SERVICE:  [facility]  Advanced Directive information    Allergies  Allergen Reactions   Aspirin     Other reaction(s): Other (See Comments) Can't take due to taking blood thinners   Morphine And Codeine Other (See Comments)    Other reaction(s): Other (See Comments) Hallucinations     Nitroglycerin     Severe headache   Promethazine Other (See Comments)    Other reaction(s): Other (See Comments) Hallucinations    Promethazine Hcl Other (See Comments)    Chief Complaint  Patient presents with   Medical Management of Chronic Issues    Patient is here for an INR check      HPI: [name] for INR follow up.  At last visit INR was mildly elevated, decreased coumadin for 3 days to 3 mg 2 weeks ago. Then continued coumadin 3.5 mg daily.   INR of 3.5 today.   Lab Results  Component Value Date   INR 3.5 (A) 05/09/2023   INR 4.1 (A) 04/29/2023   INR 3.2 (A) 04/01/2023   No usual bleeding or bruising.  Review of Systems:  Review of Systems  Constitutional:  Negative for chills, fever and weight loss.  HENT:  Negative for tinnitus.   Respiratory:  Negative for cough, sputum production and shortness of breath.   Cardiovascular:  Negative for chest pain, palpitations and leg swelling.  Gastrointestinal:  Negative for abdominal pain, constipation, diarrhea and heartburn.  Genitourinary:  Negative for dysuria, frequency and urgency.  Musculoskeletal:  Negative for back pain, falls, joint pain and myalgias.  Skin: Negative.   Neurological:  Negative for dizziness and headaches.  Psychiatric/Behavioral:  Negative for depression and memory loss. The patient does not have insomnia.   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   Right Alar Crease   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   Right Temple (in situ) (curet and 5FU)   Past Surgical History:  Procedure Laterality Date   AORTIC VALVE REPLACEMENT     mechanical   BIOPSY  03/28/2021   Procedure: BIOPSY;  Surgeon: Bernette Redbird, MD;  Location: [facility];  Service: Endoscopy;;   BLADDER SURGERY  1983   Bladder Cancer Surgery    ESOPHAGOGASTRODUODENOSCOPY (EGD) WITH PROPOFOL N/A 03/28/2021   Procedure: ESOPHAGOGASTRODUODENOSCOPY (EGD) WITH PROPOFOL;  Surgeon: Bernette Redbird, MD;  Location: [facility];  Service: Endoscopy;  Laterality: N/A;   KNEE SURGERY Right 1980   KNEE SURGERY Left 1982   OTHER SURGICAL HISTORY  1992   stomach muscles removed due to Coumadin caused bleeding.   SKIN LESION EXCISION     Some cancerous   SKIN SURGERY  01/30/2022  Basal Cell on side of nose   TONSILLECTOMY  1965   Social History:   reports that he has never smoked. His smokeless tobacco use includes snuff. He reports that he does not currently use alcohol. He reports that he does not use drugs.  Family History  Problem Relation Age of Onset   Heart disease Neg Hx     Medications: Patient's Medications  New Prescriptions   No medications on file  Previous Medications   ACETAMINOPHEN (TYLENOL) 500 MG TABLET    Take 500 mg by mouth daily as needed for moderate pain.   ALBUTEROL (VENTOLIN HFA) 108 (90 BASE) MCG/ACT INHALER    Inhale 1 puff into the lungs every 6 (six)  hours as needed for wheezing or shortness of breath.   AMOXICILLIN (AMOXIL) 500 MG CAPSULE    Take 1,000 mg by mouth 2 (two) times daily. Prior to dental procedure   CETIRIZINE (ZYRTEC) 10 MG TABLET    Take 10 mg by mouth daily.   CHOLECALCIFEROL (VITAMIN D-3 PO)    Take 1 tablet by mouth daily. Dose unknown   DOCUSATE SODIUM (COLACE) 100 MG CAPSULE    Take 100 mg by mouth as needed for mild constipation.   FAMOTIDINE (PEPCID) 20 MG TABLET    Take 20 mg by mouth daily.   FLUTICASONE (FLONASE) 50 MCG/ACT NASAL SPRAY    PLACE 1 SPRAY INTO BOTH NOSTRILS DAILY   FUROSEMIDE (LASIX) 20 MG TABLET    Take 1 tablet (20 mg total) by mouth daily as needed for fluid or edema (3lb weight gain).   IRON, FERROUS SULFATE, 325 (65 FE) MG TABS    Take 325 mg by mouth daily.   LOVASTATIN (MEVACOR) 40 MG TABLET    TAKE 1 TABLET BY MOUTH AT BEDTIME   PANTOPRAZOLE (PROTONIX) 40 MG TABLET    TAKE ONE TABLET BY MOUTH TWICE A DAY   PROPYLENE GLYCOL (SYSTANE BALANCE) 0.6 % SOLN    Apply 1 drop to eye as needed (for dry eye).   RAMIPRIL (ALTACE) 10 MG CAPSULE    Take 1 capsule (10 mg total) by mouth 2 (two) times daily.   TRAZODONE (DESYREL) 100 MG TABLET    Take 25 mg by mouth at bedtime.   VITAMIN B-12 (CYANOCOBALAMIN) 1000 MCG TABLET    Take 1,000 mcg by mouth daily.   WARFARIN (COUMADIN) 1 MG TABLET    TAKE 1/2 TABLET BY MOUTH DAILY WITH 3MG  TO TOTAL 3.5 MG except every 3rd Wednesday take additional 1/4 tablet to equal 3.75   WARFARIN (COUMADIN) 3 MG TABLET    TAKE 1 TABLET BY MOUTH DAILY ALONG WITH 0.5 MILLIGRAM TABLET FOR A TOTAL DOSE OF 3.5 MILLIGRAMS DAILY except every 3rd Wednesday 3.75 mg daily  Modified Medications   No medications on file  Discontinued Medications   No medications on file    Physical Exam:  Vitals:   05/09/23 1324  BP: 112/62  Pulse: (!) 59  Resp: 18  Temp: (!) 97.1 F (36.2 C)  SpO2: 96%  Weight: 182 lb (82.6 kg)  Height: 5\' 9"  (1.753 m)   Body mass index is 26.88 kg/m. Wt  Readings from Last 3 Encounters:  05/09/23 182 lb (82.6 kg)  04/29/23 184 lb (83.5 kg)  04/01/23 174 lb (78.9 kg)    Physical Exam***  Labs reviewed: Basic Metabolic Panel: Recent Labs    11/03/22 1121 01/31/23 1350  NA 138 135  K 4.5 4.6  CL 104 102  CO2 22 26  GLUCOSE 99 94  BUN 18 21  CREATININE 1.30* 1.32*  CALCIUM 9.0 9.0  TSH  --  0.97   Liver Function Tests: Recent Labs    11/03/22 1121  AST 17  ALT 14  BILITOT 0.3  PROT 6.0*   No results for input(s): "LIPASE", "AMYLASE" in the last 8760 hours. No results for input(s): "AMMONIA" in the last 8760 hours. CBC: Recent Labs    11/03/22 1121 01/03/23 1455 01/31/23 1350  WBC 7.5 8.6 12.6*  NEUTROABS 5,183 6,140 10,244*  HGB 14.1 12.1* 12.3*  HCT 41.7 37.1* 37.7*  MCV 87.1 81.4 83.2  PLT 269 318 307   Lipid Panel: Recent Labs    11/03/22 1121  CHOL 173  HDL 48  LDLCALC 98  TRIG 173*  CHOLHDL 3.6   TSH: Recent Labs    01/31/23 1350  TSH 0.97   A1C: No results found for: "HGBA1C"   Assessment/Plan 1. Anticoagulant long-term use ***   No follow-ups on file.: ***  Kamelia Lampkins K. Biagio Borg [facility] [phone]

## 2023-05-19 ENCOUNTER — Ambulatory Visit
Admission: RE | Admit: 2023-05-19 | Discharge: 2023-05-19 | Disposition: A | Discharge status: Home / Self Care | Payer: Commercial Managed Care - PPO | Source: Ambulatory Visit | Attending: Cardiology | Admitting: Cardiology

## 2023-05-19 DIAGNOSIS — E041 Nontoxic single thyroid nodule: Secondary | ICD-10-CM

## 2023-06-06 ENCOUNTER — Encounter: Payer: Self-pay | Admitting: Nurse Practitioner

## 2023-06-06 ENCOUNTER — Ambulatory Visit (INDEPENDENT_AMBULATORY_CARE_PROVIDER_SITE_OTHER): Payer: Medicare Other | Admitting: Nurse Practitioner

## 2023-06-06 VITALS — BP 122/76 | HR 81 | Temp 97.3°F | Ht 69.0 in | Wt 183.0 lb

## 2023-06-06 DIAGNOSIS — Z952 Presence of prosthetic heart valve: Secondary | ICD-10-CM | POA: Diagnosis not present

## 2023-06-06 DIAGNOSIS — D509 Iron deficiency anemia, unspecified: Secondary | ICD-10-CM

## 2023-06-06 DIAGNOSIS — D6869 Other thrombophilia: Secondary | ICD-10-CM | POA: Diagnosis not present

## 2023-06-06 DIAGNOSIS — F1021 Alcohol dependence, in remission: Secondary | ICD-10-CM

## 2023-06-06 DIAGNOSIS — I700 Atherosclerosis of aorta: Secondary | ICD-10-CM

## 2023-06-06 DIAGNOSIS — N1831 Chronic kidney disease, stage 3a: Secondary | ICD-10-CM

## 2023-06-06 DIAGNOSIS — Z7901 Long term (current) use of anticoagulants: Secondary | ICD-10-CM | POA: Diagnosis not present

## 2023-06-06 LAB — CBC WITH DIFFERENTIAL/PLATELET
Absolute Monocytes: 1380 cells/uL — ABNORMAL HIGH (ref 200–950)
Basophils Absolute: 40 cells/uL (ref 0–200)
Basophils Relative: 0.3 %
Eosinophils Absolute: 281 cells/uL (ref 15–500)
Eosinophils Relative: 2.1 %
HCT: 42.7 % (ref 38.5–50.0)
Hemoglobin: 14.2 g/dL (ref 13.2–17.1)
Lymphs Abs: 1769 cells/uL (ref 850–3900)
MCH: 29 pg (ref 27.0–33.0)
MCHC: 33.3 g/dL (ref 32.0–36.0)
MCV: 87.1 fL (ref 80.0–100.0)
MPV: 10.6 fL (ref 7.5–12.5)
Monocytes Relative: 10.3 %
Neutro Abs: 9929 cells/uL — ABNORMAL HIGH (ref 1500–7800)
Neutrophils Relative %: 74.1 %
Platelets: 314 10*3/uL (ref 140–400)
RBC: 4.9 10*6/uL (ref 4.20–5.80)
RDW: 14 % (ref 11.0–15.0)
Total Lymphocyte: 13.2 %
WBC: 13.4 10*3/uL — ABNORMAL HIGH (ref 3.8–10.8)

## 2023-06-06 LAB — POCT INR: INR: 3.4 — AB (ref 2.0–3.0)

## 2023-06-06 NOTE — Progress Notes (Signed)
Careteam: Patient Care Team: Garrett Seller, NP as PCP - General (Geriatric Medicine)  PLACE OF SERVICE:  [facility]  Advanced Directive information    Allergies  Allergen Reactions   Aspirin     Other reaction(s): Other (See Comments) Can't take due to taking blood thinners   Morphine And Codeine Other (See Comments)    Other reaction(s): Other (See Comments) Hallucinations     Nitroglycerin     Severe headache   Promethazine Other (See Comments)    Other reaction(s): Other (See Comments) Hallucinations    Promethazine Hcl Other (See Comments)    Chief Complaint  Patient presents with   Anticoagulation    PT/INR check. No missed doses, bleeding or major diet changes.      HPI: [name] for INR Lab Results  Component Value Date   INR 3.4 (A) 06/06/2023   INR 3.5 (A) 05/09/2023   INR 4.1 (A) 04/29/2023   Getting a migraine during visit. Only can take tylenol  Currently on coumadin 3.5 mg daily except every 3rd Wednesday taking 3.75 mg- will take the 3.75 mg coumadin dose in 2 days.  Todays INR at goal 3.4.  Has not missed dose. No sign of bleeding or bruising.   Having bilateral cataract surgery coming up  Had injection in right hip due to worsening pain, really easy process and pain much better.  "Dr Loreen Freud is great" Review of Systems:  Review of Systems  Constitutional:  Negative for chills, fever and weight loss.  HENT:  Negative for tinnitus.   Respiratory:  Negative for cough, sputum production and shortness of breath.   Cardiovascular:  Negative for chest pain, palpitations and leg swelling.  Gastrointestinal:  Negative for abdominal pain, constipation, diarrhea and heartburn.  Genitourinary:  Negative for dysuria, frequency and urgency.  Musculoskeletal:  Negative for back pain, falls, joint pain and myalgias.  Skin: Negative.   Neurological:  Negative for dizziness and headaches.  Psychiatric/Behavioral:  Negative for  depression and memory loss. The patient does not have insomnia.     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   Right Alar Crease   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   Right Temple (in situ) (curet and 5FU)   Past Surgical History:  Procedure Laterality Date   AORTIC VALVE REPLACEMENT     mechanical   BIOPSY  03/28/2021   Procedure: BIOPSY;  Surgeon: Bernette Redbird, MD;  Location: [facility];  Service: Endoscopy;;   BLADDER SURGERY  1983   Bladder Cancer Surgery    ESOPHAGOGASTRODUODENOSCOPY (EGD) WITH PROPOFOL N/A 03/28/2021   Procedure: ESOPHAGOGASTRODUODENOSCOPY (EGD) WITH PROPOFOL;  Surgeon: Bernette Redbird, MD;  Location: [facility];  Service: Endoscopy;  Laterality: N/A;   KNEE SURGERY Right 1980   KNEE  SURGERY Left 1982   OTHER SURGICAL HISTORY  1992   stomach muscles removed due to Coumadin caused bleeding.   SKIN LESION EXCISION     Some cancerous   SKIN SURGERY  01/30/2022   Basal Cell on side of nose   TONSILLECTOMY  1965   Social History:   reports that he has never smoked. His smokeless tobacco use includes snuff. He reports that he does not currently use alcohol. He reports that he does not use drugs.  Family History  Problem Relation Age of Onset   Heart disease Neg Hx     Medications: Patient's Medications  New Prescriptions   No  medications on file  Previous Medications   ACETAMINOPHEN (TYLENOL) 500 MG TABLET    Take 500 mg by mouth daily as needed for moderate pain.   ALBUTEROL (VENTOLIN HFA) 108 (90 BASE) MCG/ACT INHALER    Inhale 1 puff into the lungs every 6 (six) hours as needed for wheezing or shortness of breath.   AMOXICILLIN (AMOXIL) 500 MG CAPSULE    Take 1,000 mg by mouth 2 (two) times daily. Prior to dental procedure   CETIRIZINE (ZYRTEC) 10 MG TABLET    Take 10 mg by mouth daily.   CHOLECALCIFEROL (VITAMIN D-3 PO)    Take 1 tablet by mouth daily. Dose unknown   DOCUSATE SODIUM (COLACE) 100 MG CAPSULE    Take 100 mg by mouth as needed for mild constipation.   FAMOTIDINE (PEPCID) 20 MG TABLET    Take 20 mg by mouth daily.   FLUTICASONE (FLONASE) 50 MCG/ACT NASAL SPRAY    PLACE 1 SPRAY INTO BOTH NOSTRILS DAILY   FUROSEMIDE (LASIX) 20 MG TABLET    Take 1 tablet (20 mg total) by mouth daily as needed for fluid or edema (3lb weight gain).   IRON, FERROUS SULFATE, 325 (65 FE) MG TABS    Take 325 mg by mouth daily.   LOVASTATIN (MEVACOR) 40 MG TABLET    TAKE 1 TABLET BY MOUTH AT BEDTIME   PANTOPRAZOLE (PROTONIX) 40 MG TABLET    TAKE ONE TABLET BY MOUTH TWICE A DAY   PROPYLENE GLYCOL (SYSTANE BALANCE) 0.6 % SOLN    Apply 1 drop to eye as needed (for dry eye).   RAMIPRIL (ALTACE) 10 MG CAPSULE    Take 1 capsule (10 mg total) by mouth 2 (two) times daily.   TRAZODONE (DESYREL) 100 MG TABLET    Take 25 mg by mouth at bedtime.   VITAMIN B-12 (CYANOCOBALAMIN) 1000 MCG TABLET    Take 1,000 mcg by mouth daily.   WARFARIN (COUMADIN) 1 MG TABLET    TAKE 1/2 TABLET BY MOUTH DAILY WITH 3MG  TO TOTAL 3.5 MG except every 3rd Wednesday take additional 1/4 tablet to equal 3.75   WARFARIN (COUMADIN) 3 MG TABLET    TAKE 1 TABLET BY MOUTH DAILY ALONG WITH 0.5 MILLIGRAM TABLET FOR A TOTAL DOSE OF 3.5 MILLIGRAMS DAILY except every 3rd Wednesday 3.75 mg daily  Modified Medications   No medications on file  Discontinued Medications   No  medications on file    Physical Exam:  Vitals:   06/06/23 1401  BP: 122/76  Pulse: 81  Temp: (!) 97.3 F (36.3 C)  TempSrc: Temporal  SpO2: 98%  Weight: 183 lb (83 kg)  Height: 5\' 9"  (1.753 m)   Body mass index is 27.02 kg/m. Wt Readings from Last 3 Encounters:  06/06/23 183 lb (83 kg)  05/09/23 182 lb (82.6 kg)  04/29/23 184 lb (83.5 kg)    Physical Exam Constitutional:      General: He is not in acute distress.    Appearance: He is well-developed. He is not diaphoretic.  HENT:     Head: Normocephalic and atraumatic.     Right Ear: External ear normal.     Left Ear: External ear normal.     Mouth/Throat:     Pharynx: No oropharyngeal exudate.  Eyes:     Conjunctiva/sclera: Conjunctivae normal.     Pupils: Pupils are equal, round, and reactive to light.  Cardiovascular:     Rate and Rhythm: Normal rate and regular rhythm.     Heart sounds: Murmur heard.  Pulmonary:     Effort: Pulmonary effort is normal.     Breath sounds: Normal breath sounds.  Abdominal:     General: Bowel sounds are normal.     Palpations: Abdomen is soft.  Musculoskeletal:        General: No tenderness.     Cervical back: Normal range of motion and neck supple.     Right lower leg: No edema.     Left lower leg: No edema.  Skin:    General: Skin is warm and dry.  Neurological:     Mental Status: He is alert and oriented to person, place, and time.     Labs reviewed: Basic Metabolic Panel: Recent Labs    11/03/22 1121 01/31/23 1350  NA 138 135  K 4.5 4.6  CL 104 102  CO2 22 26  GLUCOSE 99 94  BUN 18 21  CREATININE 1.30* 1.32*  CALCIUM 9.0 9.0  TSH  --  0.97   Liver Function Tests: Recent Labs    11/03/22 1121  AST 17  ALT 14  BILITOT 0.3  PROT 6.0*   No results for input(s): "LIPASE", "AMYLASE" in the last 8760 hours. No results for input(s): "AMMONIA" in the last 8760 hours. CBC: Recent Labs    11/03/22 1121 01/03/23 1455 01/31/23 1350  WBC 7.5 8.6 12.6*   NEUTROABS 5,183 6,140 10,244*  HGB 14.1 12.1* 12.3*  HCT 41.7 37.1* 37.7*  MCV 87.1 81.4 83.2  PLT 269 318 307   Lipid Panel: Recent Labs    11/03/22 1121  CHOL 173  HDL 48  LDLCALC 98  TRIG 173*  CHOLHDL 3.6   TSH: Recent Labs    01/31/23 1350  TSH 0.97   A1C: No results found for: "HGBA1C"   Assessment/Plan 1. Anticoagulant long-term use Due to AVR, INR GOAL 3-3.5 - POC INR at goal at 3.4 today, will continue current dose of coumadin - CBC with Differential/Platelet  2. H/O aortic valve replacement Continue on coumadin  - CBC with Differential/Platelet  3. 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 Iron deficiency anemia, unspecified iron deficiency anemia type Continues on iron supplement - CBC with Differential/Platelet  5. Aortic atherosclerosis (HCC) Continue on coumadin and statin   6. Alcohol dependence in remission (HCC) Stable, continues cessation  7. Stage 3a chronic kidney disease (HCC) -Chronic and stable Encourage proper hydration Follow metabolic panel Avoid nephrotoxic meds (NSAIDS) - Complete Metabolic Panel with eGFR    Return in about 4 weeks (around 07/04/2023) for INR follow up .  Janene Harvey. Biagio Borg [facility] [phone]

## 2023-06-16 HISTORY — PX: CATARACT EXTRACTION: SUR2

## 2023-06-27 ENCOUNTER — Other Ambulatory Visit: Payer: Self-pay | Admitting: Nurse Practitioner

## 2023-06-30 HISTORY — PX: CATARACT EXTRACTION: SUR2

## 2023-07-07 ENCOUNTER — Other Ambulatory Visit: Payer: Self-pay | Admitting: Nurse Practitioner

## 2023-07-07 DIAGNOSIS — K219 Gastro-esophageal reflux disease without esophagitis: Secondary | ICD-10-CM

## 2023-07-08 ENCOUNTER — Encounter: Payer: Self-pay | Admitting: Nurse Practitioner

## 2023-07-08 ENCOUNTER — Ambulatory Visit (INDEPENDENT_AMBULATORY_CARE_PROVIDER_SITE_OTHER): Payer: Medicare Other | Admitting: Nurse Practitioner

## 2023-07-08 VITALS — BP 118/70 | HR 51 | Temp 97.5°F

## 2023-07-08 DIAGNOSIS — Z952 Presence of prosthetic heart valve: Secondary | ICD-10-CM

## 2023-07-08 DIAGNOSIS — Z7901 Long term (current) use of anticoagulants: Secondary | ICD-10-CM | POA: Diagnosis not present

## 2023-07-08 DIAGNOSIS — Z23 Encounter for immunization: Secondary | ICD-10-CM | POA: Diagnosis not present

## 2023-07-08 DIAGNOSIS — K920 Hematemesis: Secondary | ICD-10-CM

## 2023-07-08 DIAGNOSIS — K219 Gastro-esophageal reflux disease without esophagitis: Secondary | ICD-10-CM

## 2023-07-08 LAB — CBC WITH DIFFERENTIAL/PLATELET
Absolute Monocytes: 911 {cells}/uL (ref 200–950)
Basophils Absolute: 74 {cells}/uL (ref 0–200)
Basophils Relative: 0.8 %
Eosinophils Absolute: 313 {cells}/uL (ref 15–500)
Eosinophils Relative: 3.4 %
HCT: 39.4 % (ref 38.5–50.0)
Hemoglobin: 13.1 g/dL — ABNORMAL LOW (ref 13.2–17.1)
Lymphs Abs: 1279 {cells}/uL (ref 850–3900)
MCH: 29.2 pg (ref 27.0–33.0)
MCHC: 33.2 g/dL (ref 32.0–36.0)
MCV: 87.9 fL (ref 80.0–100.0)
MPV: 10.2 fL (ref 7.5–12.5)
Monocytes Relative: 9.9 %
Neutro Abs: 6624 {cells}/uL (ref 1500–7800)
Neutrophils Relative %: 72 %
Platelets: 284 10*3/uL (ref 140–400)
RBC: 4.48 10*6/uL (ref 4.20–5.80)
RDW: 13.1 % (ref 11.0–15.0)
Total Lymphocyte: 13.9 %
WBC: 9.2 10*3/uL (ref 3.8–10.8)

## 2023-07-08 LAB — POCT INR: INR: 3.2 — AB (ref 2.0–3.0)

## 2023-07-08 MED ORDER — FAMOTIDINE 20 MG PO TABS
20.0000 mg | ORAL_TABLET | Freq: Every day | ORAL | 1 refills | Status: AC
Start: 2023-07-08 — End: ?

## 2023-07-08 NOTE — Progress Notes (Signed)
Careteam: Patient Care Team: Sharon Seller, NP as PCP - General (Geriatric Medicine)  PLACE OF SERVICE:  [facility]  Advanced Directive information Does Patient Have a Medical Advance Directive?: Yes, Type of Advance Directive: Healthcare Power of [place];Living will;Out of facility DNR (pink MOST or yellow form), Pre-existing out of facility DNR order (yellow form or pink MOST form): Yellow form placed in chart (order not valid for inpatient use), Does patient want to make changes to medical advance directive?: No - Patient declined  Allergies  Allergen Reactions   Aspirin     Other reaction(s): Other (See Comments) Can't take due to taking blood thinners   Morphine And Codeine Other (See Comments)    Other reaction(s): Other (See Comments) Hallucinations     Nitroglycerin     Severe headache   Promethazine Other (See Comments)    Other reaction(s): Other (See Comments) Hallucinations    Promethazine Hcl Other (See Comments)    Chief Complaint  Patient presents with   Anticoagulation    PT/INR check. Discuss vomiting episodes (last occurrence was this Tuesday) and chest pressure (possible indigestion).      HPI: [name] for INR check.   INR today 3.2, goal 3-3.5   3 episodes of vomiting dark emesis.  Unsure if it was blood Reports he is still having issues of constipation.   Not having nausea now but has feeling of gripping burning sensation. Similar to reflux but not throwing up.   Feels like he has neuropathy in his legs and feet. More noticeable after he's been laying for a while.  tighting/tingling.  Can be painful, feet will "go to sleep" No low back pain.  He has been too sedentary.   Review of Systems:  Review of Systems  Constitutional:  Negative for chills, fever and weight loss.  HENT:  Negative for tinnitus.   Respiratory:  Negative for cough, sputum production and shortness of breath.   Cardiovascular:  Negative for  chest pain, palpitations and leg swelling.  Gastrointestinal:  Positive for vomiting. Negative for abdominal pain, constipation, diarrhea and heartburn.  Genitourinary:  Negative for dysuria, frequency and urgency.  Musculoskeletal:  Negative for back pain, falls, joint pain and myalgias.  Skin: Negative.   Neurological:  Positive for tingling. Negative for dizziness and headaches.  Psychiatric/Behavioral:  Negative for depression and memory loss. The patient does not have insomnia.     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   Right Alar Crease   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   Right Temple (in situ) (curet and 5FU)   Past Surgical History:  Procedure Laterality Date   AORTIC VALVE REPLACEMENT     mechanical   BIOPSY  03/28/2021   Procedure: BIOPSY;  Surgeon: Bernette Redbird,  MD;  Location: [facility];  Service: Endoscopy;;   BLADDER SURGERY  1983   Bladder Cancer Surgery    CATARACT EXTRACTION Right 06/30/2023   CATARACT EXTRACTION Left 06/16/2023   ESOPHAGOGASTRODUODENOSCOPY (EGD) WITH PROPOFOL N/A 03/28/2021   Procedure: ESOPHAGOGASTRODUODENOSCOPY (EGD) WITH PROPOFOL;  Surgeon: Bernette Redbird, MD;  Location: [facility];  Service: Endoscopy;  Laterality: N/A;   KNEE SURGERY Right 1980   KNEE SURGERY Left 1982   OTHER SURGICAL HISTORY  1992   stomach muscles  removed due to Coumadin caused bleeding.   SKIN LESION EXCISION     Some cancerous   SKIN SURGERY  01/30/2022   Basal Cell on side of nose   TONSILLECTOMY  1965   Social History:   reports that he has never smoked. His smokeless tobacco use includes snuff. He reports that he does not currently use alcohol. He reports that he does not use drugs.  Family History  Problem Relation Age of Onset   Heart disease Neg Hx     Medications: Patient's Medications  New Prescriptions   No medications on file  Previous Medications   ACETAMINOPHEN (TYLENOL) 500 MG TABLET    Take 500 mg by mouth daily as needed for moderate pain.   ALBUTEROL (VENTOLIN HFA) 108 (90 BASE) MCG/ACT INHALER    Inhale 1 puff into the lungs every 6 (six) hours as needed for wheezing or shortness of breath.   AMOXICILLIN (AMOXIL) 500 MG CAPSULE    Take 1,000 mg by mouth 2 (two) times daily. Prior to dental procedure   CETIRIZINE (ZYRTEC) 10 MG TABLET    Take 10 mg by mouth daily.   CHOLECALCIFEROL (VITAMIN D-3 PO)    Take 1 tablet by mouth daily. Dose unknown   DOCUSATE SODIUM (COLACE) 100 MG CAPSULE    Take 100 mg by mouth as needed for mild constipation.   FAMOTIDINE (PEPCID) 20 MG TABLET    Take 20 mg by mouth daily.   FLUTICASONE (FLONASE) 50 MCG/ACT NASAL SPRAY    PLACE 1 SPRAY INTO BOTH NOSTRILS DAILY   FUROSEMIDE (LASIX) 20 MG TABLET    Take 1 tablet (20 mg total) by mouth daily as needed for fluid or edema (3lb weight gain).   IRON, FERROUS SULFATE, 325 (65 FE) MG TABS    Take 325 mg by mouth daily.   LOVASTATIN (MEVACOR) 40 MG TABLET    TAKE 1 TABLET BY MOUTH AT BEDTIME   PANTOPRAZOLE (PROTONIX) 40 MG TABLET    TAKE 1 TABLET BY MOUTH TWICE A DAY   PROPYLENE GLYCOL (SYSTANE BALANCE) 0.6 % SOLN    Apply 1 drop to eye as needed (for dry eye).   RAMIPRIL (ALTACE) 10 MG CAPSULE    Take 1 capsule (10 mg total) by mouth 2 (two) times daily.   TRAZODONE (DESYREL) 100 MG TABLET    Take 25 mg by mouth at bedtime.   VITAMIN  B-12 (CYANOCOBALAMIN) 1000 MCG TABLET    Take 1,000 mcg by mouth daily.   WARFARIN (COUMADIN) 1 MG TABLET    TAKE 1/2 TABLET BY MOUTH DAILY WITH 3MG  TO TOTAL 3.5 MG except every 3rd Wednesday take additional 1/4 tablet to equal 3.75   WARFARIN (COUMADIN) 3 MG TABLET    TAKE 1 TABLET BY MOUTH DAILY ALONG WITH 0.5 MILLIGRAM TABLET FOR A TOTAL DOSE OF 3.5 MILLIGRAMS DAILY except every 3rd Wednesday 3.75 mg daily  Modified Medications   No medications on file  Discontinued Medications   No medications  on file    Physical Exam:  Vitals:   07/08/23 1034  BP: 118/70  Pulse: (!) 51  Temp: (!) 97.5 F (36.4 C)  TempSrc: Temporal  SpO2: 99%  Height: 5\' 9"  (1.753 m)   Body mass index is 27.02 kg/m. Wt Readings from Last 3 Encounters:  06/06/23 183 lb (83 kg)  05/09/23 182 lb (82.6 kg)  04/29/23 184 lb (83.5 kg)    Physical Exam Constitutional:      General: He is not in acute distress.    Appearance: He is well-developed. He is not diaphoretic.  HENT:     Head: Normocephalic and atraumatic.     Right Ear: External ear normal.     Left Ear: External ear normal.     Mouth/Throat:     Pharynx: No oropharyngeal exudate.  Eyes:     Conjunctiva/sclera: Conjunctivae normal.     Pupils: Pupils are equal, round, and reactive to light.  Cardiovascular:     Rate and Rhythm: Normal rate and regular rhythm.     Heart sounds: Murmur heard.  Pulmonary:     Effort: Pulmonary effort is normal.     Breath sounds: Normal breath sounds.  Abdominal:     General: Bowel sounds are normal.     Palpations: Abdomen is soft.  Musculoskeletal:        General: No tenderness.     Cervical back: Normal range of motion and neck supple.     Right lower leg: No edema.     Left lower leg: No edema.  Skin:    General: Skin is warm and dry.  Neurological:     Mental Status: He is alert and oriented to person, place, and time.     Labs reviewed: Basic Metabolic Panel: Recent Labs    11/03/22 1121  01/31/23 1350 06/06/23 1448  NA 138 135 136  K 4.5 4.6 5.1  CL 104 102 103  CO2 22 26 27   GLUCOSE 99 94 80  BUN 18 21 24   CREATININE 1.30* 1.32* 1.16  CALCIUM 9.0 9.0 9.3  TSH  --  0.97  --    Liver Function Tests: Recent Labs    11/03/22 1121 06/06/23 1448  AST 17 13  ALT 14 20  BILITOT 0.3 0.4  PROT 6.0* 5.9*   No results for input(s): "LIPASE", "AMYLASE" in the last 8760 hours. No results for input(s): "AMMONIA" in the last 8760 hours. CBC: Recent Labs    01/03/23 1455 01/31/23 1350 06/06/23 1448  WBC 8.6 12.6* 13.4*  NEUTROABS 6,140 10,244* 9,929*  HGB 12.1* 12.3* 14.2  HCT 37.1* 37.7* 42.7  MCV 81.4 83.2 87.1  PLT 318 307 314   Lipid Panel: Recent Labs    11/03/22 1121  CHOL 173  HDL 48  LDLCALC 98  TRIG 173*  CHOLHDL 3.6   TSH: Recent Labs    01/31/23 1350  TSH 0.97   A1C: No results found for: "HGBA1C"   Assessment/Plan 1. Anticoagulant long-term use - POC INR 3.2 at goal  Will continue current dosing of coumadin.   2. Need for influenza vaccination - Flu Vaccine Trivalent High Dose (Fluad)  3. H/O aortic valve replacement Continues on coumadin, goal INR 3-3.5  4. Dark emesis - unsure if there was blood in emesis Will follow up CBC with Differential/Platelet at this time.   5. Gastroesophageal reflux disease without esophagitis Having more issues with indigestion Will have him take pepcid routinely over the next week to see  if this does not improve symptoms Lifestyle modifications encourage - famotidine (PEPCID) 20 MG tablet; Take 1 tablet (20 mg total) by mouth daily.  Dispense: 30 tablet; Refill: 1    4 weeks for INR  Lunetta Marina K. Biagio Borg [facility] [phone]

## 2023-07-08 NOTE — Patient Instructions (Addendum)
Start miralax 17 gm daily for constipation- can increase to twice a day if needed or do every other day.    To start pepcid in the afternoons for acid reflux.

## 2023-07-11 ENCOUNTER — Other Ambulatory Visit: Payer: Self-pay | Admitting: Nurse Practitioner

## 2023-07-11 ENCOUNTER — Ambulatory Visit: Payer: Medicare Other | Admitting: Family

## 2023-07-11 DIAGNOSIS — K219 Gastro-esophageal reflux disease without esophagitis: Secondary | ICD-10-CM

## 2023-07-12 NOTE — Telephone Encounter (Signed)
I agree I think he is taking a different trazodone dose. Please verify

## 2023-07-12 NOTE — Telephone Encounter (Signed)
Pharmacy requested refill for Trazodone 100mg  at bedtime but we have Trazodone 25 mg by mouth at bedtime.   Tried calling patient to confirm Trazodone dosage but no answer and could not leave VM  Please Advise Dosage.

## 2023-07-19 NOTE — Progress Notes (Unsigned)
Cardiology Office Note:   Date:  07/20/2023  ID:  Grier Rocher Blandon, Wat[place] 1946/01/25, MRN [id number] PCP: Sharon Seller, NP  [facility] Providers Cardiologist:  None {  History of Present Illness:   Garrett Terry is a 77 y.o. male  who presents for follow up of AVR.    He had a history of mechanical aortic valve with a Mallie Mussel implanted at [facility] in January 1985.  He has been on anticoagulation since that time.    He returns for follow-up.  Since I last saw him he has done well.  The patient denies any new symptoms such as chest discomfort, neck or arm discomfort. There has been no new shortness of breath, PND or orthopnea. There have been no reported palpitations, presyncope or syncope.   ROS: As stated in the HPI and negative for all other systems.  Studies Reviewed:    EKG:   EKG Interpretation Date/Time:  Wednesday July 20 2023 13:38:42 EDT Ventricular Rate:  70 PR Interval:  198 QRS Duration:  136 QT Interval:  414 QTC Calculation: 447 R Axis:   -24  Text Interpretation: Sinus rhythm with occasional Premature ventricular complexes Left ventricular hypertrophy with QRS widening ( R in aVL , Cornell product ) Non-specific ST-t changes When compared with ECG of 18-Apr-2021 15:43, Ectopy is new Confirmed by Rollene Rotunda ([id number]) on 07/20/2023 1:50:16 PM     Risk Assessment/Calculations:              Physical Exam:   VS:  BP (!) 114/54 (BP Location: Left Arm, Patient Position: Sitting, Cuff Size: Normal)   Pulse 70   Ht 5\' 8"  (1.727 m)   Wt 182 lb 12.8 oz (82.9 kg)   SpO2 96%   BMI 27.79 kg/m    Wt Readings from Last 3 Encounters:  07/20/23 182 lb 12.8 oz (82.9 kg)  06/06/23 183 lb (83 kg)  05/09/23 182 lb (82.6 kg)     GEN: Well nourished, well developed in no acute distress NECK: No JVD; No carotid bruits CARDIAC: RRR,, mechanical S2, no murmurs, rubs, gallops RESPIRATORY:  Clear to auscultation without rales, wheezing or  rhonchi  ABDOMEN: Soft, non-tender, non-distended EXTREMITIES:  No edema; No deformity   ASSESSMENT AND PLAN:   AVR:   There was stable BS valve function in June 2023.  Change in therapy.  He understands endocarditis prophylaxis and is up-to-date with Coumadin follow-up.  He would need Lovenox bridge if he ever comes off Coumadin.  CKD: His last creatinine was 0.97.  No change in therapy.     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  Follow up in June 2025.            Follow up with me in one year.   Signed, Rollene Rotunda, MD

## 2023-07-20 ENCOUNTER — Ambulatory Visit: Payer: Medicare Other | Attending: Cardiology | Admitting: Cardiology

## 2023-07-20 ENCOUNTER — Encounter: Payer: Self-pay | Admitting: Cardiology

## 2023-07-20 VITALS — BP 114/54 | HR 70 | Ht 68.0 in | Wt 182.8 lb

## 2023-07-20 DIAGNOSIS — R55 Syncope and collapse: Secondary | ICD-10-CM | POA: Diagnosis present

## 2023-07-20 DIAGNOSIS — I6523 Occlusion and stenosis of bilateral carotid arteries: Secondary | ICD-10-CM | POA: Insufficient documentation

## 2023-07-20 DIAGNOSIS — N182 Chronic kidney disease, stage 2 (mild): Secondary | ICD-10-CM | POA: Insufficient documentation

## 2023-07-20 DIAGNOSIS — Z952 Presence of prosthetic heart valve: Secondary | ICD-10-CM | POA: Insufficient documentation

## 2023-07-20 NOTE — Patient Instructions (Signed)
Medication Instructions:  NO CHANGES  *If you need a refill on your cardiac medications before your next appointment, please call your pharmacy*  Follow-Up: At [facility], you and your health needs are our priority.  As part of our continuing mission to provide you with exceptional heart care, we have created designated Provider Care Teams.  These Care Teams include your primary Cardiologist (physician) and Advanced Practice Providers (APPs -  Physician Assistants and Nurse Practitioners) who all work together to provide you with the care you need, when you need it.  We recommend signing up for the patient portal called "MyChart".  Sign up information is provided on this After Visit Summary.  MyChart is used to connect with patients for Virtual Visits (Telemedicine).  Patients are able to view lab/test results, encounter notes, upcoming appointments, etc.  Non-urgent messages can be sent to your provider as well.   To learn more about what you can do with MyChart, go to [email or web address].    Your next appointment:    12 months with Dr. Hochrein 

## 2023-08-03 ENCOUNTER — Ambulatory Visit: Payer: Medicare Other | Admitting: Nurse Practitioner

## 2023-08-03 ENCOUNTER — Encounter: Payer: Self-pay | Admitting: Nurse Practitioner

## 2023-08-03 VITALS — BP 124/78 | HR 71 | Temp 97.3°F | Resp 18 | Ht 68.0 in | Wt 183.0 lb

## 2023-08-03 DIAGNOSIS — D509 Iron deficiency anemia, unspecified: Secondary | ICD-10-CM | POA: Diagnosis not present

## 2023-08-03 DIAGNOSIS — Z7901 Long term (current) use of anticoagulants: Secondary | ICD-10-CM | POA: Diagnosis not present

## 2023-08-03 DIAGNOSIS — Z952 Presence of prosthetic heart valve: Secondary | ICD-10-CM

## 2023-08-03 DIAGNOSIS — K219 Gastro-esophageal reflux disease without esophagitis: Secondary | ICD-10-CM

## 2023-08-03 LAB — CBC WITH DIFFERENTIAL/PLATELET
Absolute Monocytes: 828 {cells}/uL (ref 200–950)
Basophils Absolute: 64 {cells}/uL (ref 0–200)
Basophils Relative: 0.7 %
Eosinophils Absolute: 491 {cells}/uL (ref 15–500)
Eosinophils Relative: 5.4 %
HCT: 43.7 % (ref 38.5–50.0)
Hemoglobin: 14 g/dL (ref 13.2–17.1)
Lymphs Abs: 1474 {cells}/uL (ref 850–3900)
MCH: 28.9 pg (ref 27.0–33.0)
MCHC: 32 g/dL (ref 32.0–36.0)
MCV: 90.1 fL (ref 80.0–100.0)
MPV: 10.9 fL (ref 7.5–12.5)
Monocytes Relative: 9.1 %
Neutro Abs: 6243 {cells}/uL (ref 1500–7800)
Neutrophils Relative %: 68.6 %
Platelets: 288 10*3/uL (ref 140–400)
RBC: 4.85 10*6/uL (ref 4.20–5.80)
RDW: 13 % (ref 11.0–15.0)
Total Lymphocyte: 16.2 %
WBC: 9.1 10*3/uL (ref 3.8–10.8)

## 2023-08-03 LAB — POCT INR: POC INR: 3.4

## 2023-08-03 NOTE — Patient Instructions (Addendum)
Use nettipot or spray at bedtime or wash nose out at bedtime.   Can try loradine (claritin) 10 mg daily for allergies (instead of zyrtec)   Ipratropium nasal spray is prescription which you could try

## 2023-08-03 NOTE — Progress Notes (Unsigned)
Careteam: Patient Care Team: Sharon Seller, NP as PCP - General (Geriatric Medicine)  PLACE OF SERVICE:  [facility]  Advanced Directive information Does Patient Have a Medical Advance Directive?: Yes, Type of Advance Directive: Healthcare Power of [place];Living will;Out of facility DNR (pink MOST or yellow form), Pre-existing out of facility DNR order (yellow form or pink MOST form): Yellow form placed in chart (order not valid for inpatient use), Does patient want to make changes to medical advance directive?: No - Patient declined  Allergies  Allergen Reactions   Aspirin     Other reaction(s): Other (See Comments) Can't take due to taking blood thinners   Morphine And Codeine Other (See Comments)    Other reaction(s): Other (See Comments) Hallucinations     Nitroglycerin     Severe headache   Promethazine Other (See Comments)    Other reaction(s): Other (See Comments) Hallucinations    Promethazine Hcl Other (See Comments)    Chief Complaint  Patient presents with   Acute Visit    INR check, Patient will take medication tonight     HPI: [name] for INR check.  INR today at goal at 3.4  Reports he is having a lot of allergies, feeling really full in his head.  Use flonase which has helped Bad in the morning but better by midday.   No more dark stools. On iron supplement Anemia noted on last labs.   GERD- better now, using pepcid PRN now but he feels like he may need it more routinely.  Symptoms persist but not debilating.   Review of Systems:  Review of Systems  Constitutional:  Negative for chills, fever and weight loss.  HENT:  Negative for tinnitus.   Respiratory:  Negative for cough, sputum production and shortness of breath.   Cardiovascular:  Negative for chest pain, palpitations and leg swelling.  Gastrointestinal:  Positive for heartburn. Negative for abdominal pain, constipation and diarrhea.  Genitourinary:  Negative for  dysuria, frequency and urgency.  Musculoskeletal:  Negative for back pain, falls, joint pain and myalgias.  Skin: Negative.   Neurological:  Negative for dizziness and headaches.  Psychiatric/Behavioral:  Negative for depression and memory loss. The patient does not have insomnia.     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   Right Alar Crease   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   Right Temple (in situ) (curet and 5FU)   Past Surgical History:  Procedure Laterality Date   AORTIC VALVE REPLACEMENT     mechanical   BIOPSY  03/28/2021   Procedure: BIOPSY;  Surgeon: Bernette Redbird, MD;  Location: [facility];  Service: Endoscopy;;   BLADDER SURGERY  1983   Bladder Cancer Surgery    CATARACT EXTRACTION Right 06/30/2023  CATARACT EXTRACTION Left 06/16/2023   ESOPHAGOGASTRODUODENOSCOPY (EGD) WITH PROPOFOL N/A 03/28/2021   Procedure: ESOPHAGOGASTRODUODENOSCOPY (EGD) WITH PROPOFOL;  Surgeon: Bernette Redbird, MD;  Location: [facility];  Service: Endoscopy;  Laterality: N/A;   KNEE SURGERY Right 1980   KNEE SURGERY Left 1982   OTHER SURGICAL HISTORY  1992   stomach muscles removed due to Coumadin caused bleeding.   SKIN LESION EXCISION     Some cancerous   SKIN SURGERY  01/30/2022   Basal Cell on side of nose   TONSILLECTOMY  1965   Social History:   reports that he  has never smoked. His smokeless tobacco use includes snuff. He reports that he does not currently use alcohol. He reports that he does not use drugs.  Family History  Problem Relation Age of Onset   Heart disease Neg Hx     Medications: Patient's Medications  New Prescriptions   No medications on file  Previous Medications   ACETAMINOPHEN (TYLENOL) 500 MG TABLET    Take 500 mg by mouth daily as needed for moderate pain.   ALBUTEROL (VENTOLIN HFA) 108 (90 BASE) MCG/ACT INHALER    Inhale 1 puff into the lungs every 6 (six) hours as needed for wheezing or shortness of breath.   AMOXICILLIN (AMOXIL) 500 MG CAPSULE    Take 1,000 mg by mouth 2 (two) times daily. Prior to dental procedure   CETIRIZINE (ZYRTEC) 10 MG TABLET    Take 10 mg by mouth daily.   CHOLECALCIFEROL (VITAMIN D-3 PO)    Take 1 tablet by mouth daily. Dose unknown   DOCUSATE SODIUM (COLACE) 100 MG CAPSULE    Take 100 mg by mouth as needed for mild constipation.   FAMOTIDINE (PEPCID) 20 MG TABLET    Take 1 tablet (20 mg total) by mouth daily.   FEROSUL 325 (65 FE) MG TABLET    TAKE 1 TABLET BY MOUTH DAILY   FLUTICASONE (FLONASE) 50 MCG/ACT NASAL SPRAY    PLACE 1 SPRAY INTO BOTH NOSTRILS DAILY   FUROSEMIDE (LASIX) 20 MG TABLET    Take 1 tablet (20 mg total) by mouth daily as needed for fluid or edema (3lb weight gain).   LOVASTATIN (MEVACOR) 40 MG TABLET    TAKE 1 TABLET BY MOUTH AT BEDTIME   PANTOPRAZOLE (PROTONIX) 40 MG TABLET    TAKE 1 TABLET BY MOUTH TWICE A DAY   PROPYLENE GLYCOL (SYSTANE BALANCE) 0.6 % SOLN    Apply 1 drop to eye as needed (for dry eye).   RAMIPRIL (ALTACE) 10 MG CAPSULE    Take 1 capsule (10 mg total) by mouth 2 (two) times daily.   TRAZODONE (DESYREL) 50 MG TABLET    Take 0.5 tablets (25 mg total) by mouth at bedtime.   VITAMIN B-12 (CYANOCOBALAMIN) 1000 MCG TABLET    Take 1,000 mcg by mouth daily.   WARFARIN (COUMADIN) 1 MG TABLET    TAKE 1/2 TABLET BY MOUTH DAILY WITH 3MG  TO TOTAL 3.5 MG except every  3rd Wednesday take additional 1/4 tablet to equal 3.75   WARFARIN (COUMADIN) 3 MG TABLET    TAKE 1 TABLET BY MOUTH DAILY ALONG WITH 0.5 MILLIGRAM TABLET FOR A TOTAL DOSE OF 3.5 MILLIGRAMS DAILY except every 3rd Wednesday 3.75 mg daily  Modified Medications   No medications on file  Discontinued Medications   No medications on file    Physical Exam:  Vitals:   08/03/23 1350  BP: 124/78  Pulse: 71  Resp: 18  Temp: (!)  97.3 F (36.3 C)  SpO2: 95%  Weight: 183 lb (83 kg)  Height: 5\' 8"  (1.727 m)   Body mass index is 27.83 kg/m. Wt Readings from Last 3 Encounters:  08/03/23 183 lb (83 kg)  07/20/23 182 lb 12.8 oz (82.9 kg)  06/06/23 183 lb (83 kg)    Physical Exam Constitutional:      General: He is not in acute distress.    Appearance: He is well-developed. He is not diaphoretic.  HENT:     Head: Normocephalic and atraumatic.     Right Ear: External ear normal.     Left Ear: External ear normal.     Mouth/Throat:     Pharynx: No oropharyngeal exudate.  Eyes:     Conjunctiva/sclera: Conjunctivae normal.     Pupils: Pupils are equal, round, and reactive to light.  Cardiovascular:     Rate and Rhythm: Normal rate and regular rhythm.     Heart sounds: Murmur heard.  Pulmonary:     Effort: Pulmonary effort is normal.     Breath sounds: Normal breath sounds.  Abdominal:     General: Bowel sounds are normal.     Palpations: Abdomen is soft.  Musculoskeletal:        General: No tenderness.     Cervical back: Normal range of motion and neck supple.     Right lower leg: No edema.     Left lower leg: No edema.  Skin:    General: Skin is warm and dry.  Neurological:     Mental Status: He is alert and oriented to person, place, and time.     Labs reviewed: Basic Metabolic Panel: Recent Labs    11/03/22 1121 01/31/23 1350 06/06/23 1448  NA 138 135 136  K 4.5 4.6 5.1  CL 104 102 103  CO2 22 26 27   GLUCOSE 99 94 80  BUN 18 21 24   CREATININE 1.30* 1.32* 1.16   CALCIUM 9.0 9.0 9.3  TSH  --  0.97  --    Liver Function Tests: Recent Labs    11/03/22 1121 06/06/23 1448  AST 17 13  ALT 14 20  BILITOT 0.3 0.4  PROT 6.0* 5.9*   No results for input(s): "LIPASE", "AMYLASE" in the last 8760 hours. No results for input(s): "AMMONIA" in the last 8760 hours. CBC: Recent Labs    01/31/23 1350 06/06/23 1448 07/08/23 1403  WBC 12.6* 13.4* 9.2  NEUTROABS 10,244* 9,929* 6,624  HGB 12.3* 14.2 13.1*  HCT 37.7* 42.7 39.4  MCV 83.2 87.1 87.9  PLT 307 314 284   Lipid Panel: Recent Labs    11/03/22 1121  CHOL 173  HDL 48  LDLCALC 98  TRIG 173*  CHOLHDL 3.6   TSH: Recent Labs    01/31/23 1350  TSH 0.97   A1C: No results found for: "HGBA1C"   Assessment/Plan 1. Anticoagulant long-term use -goal INR 3-3.5 today INR 3.4 Will continue current coumadin dose - POC INR - CBC with Differential/Platelet  2. H/O aortic valve replacement -continue on coumadin for anticoagulation, goal 3-3.5  - CBC with Differential/Platelet  3. Iron deficiency anemia, unspecified iron deficiency anemia type Hgb had dropped last month, no further signs of blood loss, will follow up today - CBC with Differential/Platelet  4. Gastroesophageal reflux disease without esophagitis -encouraged to eat breakfast  Can use pepcid daily if needed for increase in symptoms of GERD   Follow up INR in 4 weeks.   Janene Harvey. Biagio Borg  [facility] [phone]

## 2023-09-02 ENCOUNTER — Encounter: Payer: Self-pay | Admitting: Nurse Practitioner

## 2023-09-02 ENCOUNTER — Ambulatory Visit: Payer: Medicare Other | Admitting: Nurse Practitioner

## 2023-09-02 VITALS — BP 126/78 | HR 73 | Temp 97.1°F | Ht 68.0 in | Wt 178.8 lb

## 2023-09-02 DIAGNOSIS — Z7901 Long term (current) use of anticoagulants: Secondary | ICD-10-CM | POA: Diagnosis not present

## 2023-09-02 DIAGNOSIS — J309 Allergic rhinitis, unspecified: Secondary | ICD-10-CM | POA: Diagnosis not present

## 2023-09-02 LAB — POCT INR: INR: 4.7 — AB (ref 2.0–3.0)

## 2023-09-02 MED ORDER — FLUTICASONE PROPIONATE 50 MCG/ACT NA SUSP: 1.0000 | Freq: Every day | NASAL | 1 refills | Status: DC

## 2023-09-02 NOTE — Patient Instructions (Addendum)
Decrease coumadin to 3 mg for today and tomorrow then resume usual dosing.   Follow up on 09/19/23 at 11 am

## 2023-09-02 NOTE — Progress Notes (Unsigned)
Careteam: Patient Care Team: Sharon Seller, NP as PCP - General (Geriatric Medicine)  PLACE OF SERVICE:  [facility]  Advanced Directive information    Allergies  Allergen Reactions   Aspirin     Other reaction(s): Other (See Comments) Can't take due to taking blood thinners   Morphine And Codeine Other (See Comments)    Other reaction(s): Other (See Comments) Hallucinations     Nitroglycerin     Severe headache   Promethazine Other (See Comments)    Other reaction(s): Other (See Comments) Hallucinations    Promethazine Hcl Other (See Comments)    Chief Complaint  Patient presents with   Anticoagulation    PT/INR check. No missed doses, no diet changes, and no unusual bleeding. Allergy medication not effective x 24 hours      HPI: [name] for INR follow up  INR/Prothrombin Time today 4.7, goal INR 3-3.5 No abnormal bleeding or bruising noted. No changes in medication or diet.  No shortness of breath, LE edema, chest pains noted.   Review of Systems:  Review of Systems  Constitutional:  Negative for chills, fever and weight loss.  HENT:  Negative for tinnitus.   Respiratory:  Negative for cough, sputum production and shortness of breath.   Cardiovascular:  Negative for chest pain, palpitations and leg swelling.  Gastrointestinal:  Negative for abdominal pain, constipation, diarrhea and heartburn.  Genitourinary:  Negative for dysuria, frequency and urgency.  Musculoskeletal:  Negative for back pain, falls, joint pain and myalgias.  Skin: Negative.   Neurological:  Negative for dizziness and headaches.  Endo/Heme/Allergies:  Positive for environmental allergies.  Psychiatric/Behavioral:  Negative for depression and memory loss. The patient does not have insomnia.     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   Right Alar Crease   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   Right Temple (in situ) (curet and 5FU)   Past Surgical History:  Procedure Laterality Date   AORTIC VALVE REPLACEMENT     mechanical   BIOPSY  03/28/2021   Procedure: BIOPSY;  Surgeon: Bernette Redbird, MD;  Location: [facility];  Service: Endoscopy;;   BLADDER SURGERY  1983   Bladder Cancer Surgery    CATARACT EXTRACTION Right 06/30/2023   CATARACT EXTRACTION Left 06/16/2023   ESOPHAGOGASTRODUODENOSCOPY (EGD) WITH PROPOFOL N/A 03/28/2021   Procedure: ESOPHAGOGASTRODUODENOSCOPY (EGD) WITH PROPOFOL;  Surgeon: Bernette Redbird, MD;  Location: [facility];  Service: Endoscopy;  Laterality: N/A;   KNEE SURGERY Right 1980   KNEE SURGERY Left 1982   OTHER SURGICAL HISTORY  1992   stomach muscles removed due to Coumadin caused bleeding.   SKIN LESION EXCISION     Some cancerous   SKIN SURGERY  01/30/2022   Basal Cell on side of  nose   TONSILLECTOMY  1965   Social History:   reports that he has never smoked. His smokeless tobacco use includes snuff. He reports that he does not currently use alcohol. He reports that he does not use drugs.  Family History  Problem Relation Age of Onset   Heart disease Neg Hx     Medications: Patient's Medications  New Prescriptions   No medications on file  Previous Medications   ACETAMINOPHEN (TYLENOL) 500 MG TABLET    Take 500 mg by mouth daily as needed for moderate pain.   ALBUTEROL (VENTOLIN HFA)  108 (90 BASE) MCG/ACT INHALER    Inhale 1 puff into the lungs every 6 (six) hours as needed for wheezing or shortness of breath.   AMOXICILLIN (AMOXIL) 500 MG CAPSULE    Take 1,000 mg by mouth 2 (two) times daily. Prior to dental procedure   CETIRIZINE (ZYRTEC) 10 MG TABLET    Take 10 mg by mouth daily.   CHOLECALCIFEROL (VITAMIN D-3 PO)    Take 1 tablet by mouth daily. Dose unknown   DOCUSATE SODIUM (COLACE) 100 MG CAPSULE    Take 100 mg by mouth as needed for mild constipation.   FAMOTIDINE (PEPCID) 20 MG TABLET    Take 1 tablet (20 mg total) by mouth daily.   FAMOTIDINE (PEPCID) 20 MG TABLET    Take 20 mg by mouth as needed for heartburn or indigestion.   FEROSUL 325 (65 FE) MG TABLET    TAKE 1 TABLET BY MOUTH DAILY   FLUTICASONE (FLONASE) 50 MCG/ACT NASAL SPRAY    PLACE 1 SPRAY INTO BOTH NOSTRILS DAILY   FUROSEMIDE (LASIX) 20 MG TABLET    Take 1 tablet (20 mg total) by mouth daily as needed for fluid or edema (3lb weight gain).   LOVASTATIN (MEVACOR) 40 MG TABLET    TAKE 1 TABLET BY MOUTH AT BEDTIME   PANTOPRAZOLE (PROTONIX) 40 MG TABLET    TAKE 1 TABLET BY MOUTH TWICE A DAY   PROPYLENE GLYCOL (SYSTANE BALANCE) 0.6 % SOLN    Apply 1 drop to eye as needed (for dry eye).   RAMIPRIL (ALTACE) 10 MG CAPSULE    Take 1 capsule (10 mg total) by mouth 2 (two) times daily.   TRAZODONE (DESYREL) 50 MG TABLET    Take 0.5 tablets (25 mg total) by mouth at bedtime.   VITAMIN B-12 (CYANOCOBALAMIN) 1000 MCG TABLET    Take 1,000 mcg by mouth daily.   WARFARIN (COUMADIN) 1 MG TABLET    TAKE 1/2 TABLET BY MOUTH DAILY WITH 3MG  TO TOTAL 3.5 MG except every 3rd Wednesday take additional 1/4 tablet to equal 3.75   WARFARIN (COUMADIN) 3 MG TABLET    TAKE 1 TABLET BY MOUTH DAILY ALONG WITH 0.5 MILLIGRAM TABLET FOR A TOTAL DOSE OF 3.5 MILLIGRAMS DAILY except every 3rd Wednesday 3.75 mg daily  Modified Medications   No medications on file  Discontinued Medications   No medications on file    Physical  Exam:  Vitals:   09/02/23 1410  BP: 126/78  Pulse: 73  Temp: (!) 97.1 F (36.2 C)  TempSrc: Temporal  SpO2: 99%  Weight: 178 lb 12.8 oz (81.1 kg)  Height: 5\' 8"  (1.727 m)   Body mass index is 27.19 kg/m. Wt Readings from Last 3 Encounters:  09/02/23 178 lb 12.8 oz (81.1 kg)  08/03/23 183 lb (83 kg)  07/20/23 182 lb 12.8 oz (82.9 kg)    Physical Exam Constitutional:  General: He is not in acute distress.    Appearance: He is well-developed. He is not diaphoretic.  HENT:     Head: Normocephalic and atraumatic.     Right Ear: External ear normal.     Left Ear: External ear normal.     Mouth/Throat:     Pharynx: No oropharyngeal exudate.  Eyes:     Conjunctiva/sclera: Conjunctivae normal.     Pupils: Pupils are equal, round, and reactive to light.  Cardiovascular:     Rate and Rhythm: Normal rate and regular rhythm.     Heart sounds: Murmur heard.  Pulmonary:     Effort: Pulmonary effort is normal.     Breath sounds: Normal breath sounds.  Abdominal:     General: Bowel sounds are normal.     Palpations: Abdomen is soft.  Musculoskeletal:        General: No tenderness.     Cervical back: Normal range of motion and neck supple.     Right lower leg: No edema.     Left lower leg: No edema.  Skin:    General: Skin is warm and dry.  Neurological:     Mental Status: He is alert and oriented to person, place, and time.     Labs reviewed: Basic Metabolic Panel: Recent Labs    11/03/22 1121 01/31/23 1350 06/06/23 1448  NA 138 135 136  K 4.5 4.6 5.1  CL 104 102 103  CO2 22 26 27   GLUCOSE 99 94 80  BUN 18 21 24   CREATININE 1.30* 1.32* 1.16  CALCIUM 9.0 9.0 9.3  TSH  --  0.97  --    Liver Function Tests: Recent Labs    11/03/22 1121 06/06/23 1448  AST 17 13  ALT 14 20  BILITOT 0.3 0.4  PROT 6.0* 5.9*   No results for input(s): "LIPASE", "AMYLASE" in the last 8760 hours. No results for input(s): "AMMONIA" in the last 8760 hours. CBC: Recent Labs     06/06/23 1448 07/08/23 1403 08/03/23 1426  WBC 13.4* 9.2 9.1  NEUTROABS 9,929* 6,624 6,243  HGB 14.2 13.1* 14.0  HCT 42.7 39.4 43.7  MCV 87.1 87.9 90.1  PLT 314 284 288   Lipid Panel: Recent Labs    11/03/22 1121  CHOL 173  HDL 48  LDLCALC 98  TRIG 173*  CHOLHDL 3.6   TSH: Recent Labs    01/31/23 1350  TSH 0.97   A1C: No results found for: "HGBA1C"   Assessment/Plan 1. Anticoagulant long-term use - POC INR elevated today at 4.7, no signs of bleeding. Will have him take coumadin 3 mg today and tomorrow then resume previous 3.5 mg dosing with 3.75 mg every 3rd Wednesday.   2. Allergic rhinitis, unspecified seasonality, unspecified trigger -zyrtec 10 mg by mouth daily -flonase twice daily into nares  To use nettipot daily   2 weeks for INR check  Terianna Peggs K. Biagio Borg [facility] [phone]

## 2023-09-19 ENCOUNTER — Other Ambulatory Visit: Payer: Self-pay | Admitting: Nurse Practitioner

## 2023-09-19 ENCOUNTER — Ambulatory Visit (INDEPENDENT_AMBULATORY_CARE_PROVIDER_SITE_OTHER): Payer: Medicare Other | Admitting: Nurse Practitioner

## 2023-09-19 ENCOUNTER — Encounter: Payer: Self-pay | Admitting: Nurse Practitioner

## 2023-09-19 VITALS — BP 120/72 | HR 90 | Temp 97.3°F | Ht 68.0 in | Wt 183.0 lb

## 2023-09-19 DIAGNOSIS — D6869 Other thrombophilia: Secondary | ICD-10-CM

## 2023-09-19 DIAGNOSIS — Z952 Presence of prosthetic heart valve: Secondary | ICD-10-CM

## 2023-09-19 DIAGNOSIS — Z7901 Long term (current) use of anticoagulants: Secondary | ICD-10-CM

## 2023-09-19 DIAGNOSIS — L723 Sebaceous cyst: Secondary | ICD-10-CM

## 2023-09-19 LAB — POCT INR: POC INR: 3.3

## 2023-09-19 NOTE — Progress Notes (Signed)
Careteam: Patient Care Team: Sharon Seller, NP as PCP - General (Geriatric Medicine)  PLACE OF SERVICE:  [facility]  Advanced Directive information    Allergies  Allergen Reactions   Aspirin     Other reaction(s): Other (See Comments) Can't take due to taking blood thinners   Morphine And Codeine Other (See Comments)    Other reaction(s): Other (See Comments) Hallucinations     Nitroglycerin     Severe headache   Promethazine Other (See Comments)    Other reaction(s): Other (See Comments) Hallucinations    Promethazine Hcl Other (See Comments)    Chief Complaint  Patient presents with   Anticoagulation    PT/INR check. NCIR verified. Examine cyst on the back of head (larger and painful) x 6-8 weeks. No missed doses, no bleeding, and no diet changes.      HPI: [name] for INR.  They went down to 3 mg coumadin after elevated INR last visit.  Has been on current regimen.  Todays INR 3.3 No abnormal bruising or bleeding.  Has taken medication as prescribed.    Has area on back of the neck for 4-6 weeks. Went to dermatologist but in the last week started bothering him more since this appt. Has not done anything to treat area. Tender last night when he laid down.   Review of Systems:  Review of Systems  Constitutional:  Negative for chills, fever and weight loss.  HENT:  Negative for tinnitus.   Respiratory:  Negative for cough, sputum production and shortness of breath.   Cardiovascular:  Negative for chest pain, palpitations and leg swelling.  Gastrointestinal:  Negative for abdominal pain, constipation, diarrhea and heartburn.  Genitourinary:  Negative for dysuria, frequency and urgency.  Musculoskeletal:  Negative for back pain, falls, joint pain and myalgias.  Skin: Negative.        Painful raised area on back of neck  Neurological:  Negative for dizziness and headaches.  Psychiatric/Behavioral:  Negative for depression and memory  loss. The patient does not have insomnia.     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   Right Alar Crease   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   Right Temple (in situ) (curet and 5FU)   Past Surgical History:  Procedure Laterality Date   AORTIC VALVE REPLACEMENT     mechanical   BIOPSY  03/28/2021   Procedure: BIOPSY;  Surgeon: Bernette Redbird, MD;  Location: [facility];  Service: Endoscopy;;   BLADDER SURGERY  1983   Bladder Cancer Surgery    CATARACT EXTRACTION Right 06/30/2023   CATARACT EXTRACTION Left 06/16/2023   ESOPHAGOGASTRODUODENOSCOPY (EGD) WITH PROPOFOL N/A 03/28/2021   Procedure: ESOPHAGOGASTRODUODENOSCOPY (EGD) WITH PROPOFOL;  Surgeon: Bernette Redbird, MD;  Location: [facility];  Service:  Endoscopy;  Laterality: N/A;   KNEE SURGERY Right 1980   KNEE SURGERY Left 1982   OTHER SURGICAL HISTORY  1992   stomach muscles removed due to Coumadin caused bleeding.   SKIN LESION EXCISION     Some cancerous   SKIN SURGERY  01/30/2022   Basal Cell on side of nose   TONSILLECTOMY  1965   Social History:   reports that he has never smoked. His smokeless tobacco use includes snuff. He reports that he does not currently use alcohol. He reports that he does not use drugs.  Family History  Problem Relation Age of Onset   Heart disease Neg Hx      Medications: Patient's Medications  New Prescriptions   No medications on file  Previous Medications   ACETAMINOPHEN (TYLENOL) 500 MG TABLET    Take 500 mg by mouth daily as needed for moderate pain.   ALBUTEROL (VENTOLIN HFA) 108 (90 BASE) MCG/ACT INHALER    Inhale 1 puff into the lungs every 6 (six) hours as needed for wheezing or shortness of breath.   AMOXICILLIN (AMOXIL) 500 MG CAPSULE    Take 1,000 mg by mouth 2 (two) times daily. Prior to dental procedure   CETIRIZINE (ZYRTEC) 10 MG TABLET    Take 10 mg by mouth daily.   CHOLECALCIFEROL (VITAMIN D-3 PO)    Take 1 tablet by mouth daily. Dose unknown   DOCUSATE SODIUM (COLACE) 100 MG CAPSULE    Take 100 mg by mouth as needed for mild constipation.   FAMOTIDINE (PEPCID) 20 MG TABLET    Take 1 tablet (20 mg total) by mouth daily.   FAMOTIDINE (PEPCID) 20 MG TABLET    Take 20 mg by mouth as needed for heartburn or indigestion.   FAMOTIDINE (PEPCID) 20 MG TABLET    Take 20 mg by mouth as needed for heartburn or indigestion.   FEROSUL 325 (65 FE) MG TABLET    TAKE 1 TABLET BY MOUTH DAILY   FLUTICASONE (FLONASE) 50 MCG/ACT NASAL SPRAY    Place 1 spray into both nostrils daily.   FUROSEMIDE (LASIX) 20 MG TABLET    Take 1 tablet (20 mg total) by mouth daily as needed for fluid or edema (3lb weight gain).   LOVASTATIN (MEVACOR) 40 MG TABLET    TAKE 1 TABLET BY MOUTH AT BEDTIME   PANTOPRAZOLE (PROTONIX) 40 MG TABLET    TAKE 1 TABLET BY MOUTH TWICE A DAY   PROPYLENE GLYCOL (SYSTANE BALANCE) 0.6 % SOLN    Apply 1 drop to eye as needed (for dry eye).   RAMIPRIL (ALTACE) 10 MG CAPSULE    Take 1 capsule (10 mg total) by mouth 2 (two) times daily.   TRAZODONE (DESYREL) 50 MG TABLET    Take 0.5 tablets (25 mg total) by mouth at bedtime.   VITAMIN B-12 (CYANOCOBALAMIN) 1000 MCG TABLET    Take 1,000 mcg by mouth daily.   WARFARIN (COUMADIN) 1 MG TABLET    TAKE 1/2 TABLET BY MOUTH DAILY WITH 3MG  TO TOTAL 3.5 MG except every 3rd Wednesday take  additional 1/4 tablet to equal 3.75   WARFARIN (COUMADIN) 3 MG TABLET    TAKE 1 TABLET BY MOUTH DAILY ALONG WITH 0.5 MILLIGRAM TABLET FOR A TOTAL DOSE OF 3.5 MILLIGRAMS DAILY except every 3rd Wednesday 3.75 mg daily  Modified Medications   No medications on file  Discontinued Medications   No medications on file    Physical Exam:  Vitals:   09/19/23  0852  BP: 120/72  Pulse: 90  Temp: (!) 97.3 F (36.3 C)  TempSrc: Temporal  SpO2: 99%  Weight: 183 lb (83 kg)  Height: 5\' 8"  (1.727 m)   Body mass index is 27.83 kg/m. Wt Readings from Last 3 Encounters:  09/19/23 183 lb (83 kg)  09/02/23 178 lb 12.8 oz (81.1 kg)  08/03/23 183 lb (83 kg)    Physical Exam Constitutional:      General: He is not in acute distress.    Appearance: He is well-developed. He is not diaphoretic.  HENT:     Head: Normocephalic and atraumatic.     Right Ear: External ear normal.     Left Ear: External ear normal.     Mouth/Throat:     Pharynx: No oropharyngeal exudate.  Eyes:     Conjunctiva/sclera: Conjunctivae normal.     Pupils: Pupils are equal, round, and reactive to light.  Cardiovascular:     Rate and Rhythm: Normal rate and regular rhythm.     Heart sounds: Murmur heard.  Pulmonary:     Effort: Pulmonary effort is normal.     Breath sounds: Normal breath sounds.  Abdominal:     General: Bowel sounds are normal.     Palpations: Abdomen is soft.  Musculoskeletal:        General: No tenderness.     Cervical back: Normal range of motion and neck supple.     Right lower leg: No edema.     Left lower leg: No edema.  Skin:    General: Skin is warm and dry.     Comments: Raised tender sebaceous cyst to back of neck, no drainage, minimal erythema.   Neurological:     Mental Status: He is alert and oriented to person, place, and time.     Labs reviewed: Basic Metabolic Panel: Recent Labs    11/03/22 1121 01/31/23 1350 06/06/23 1448  NA 138 135 136  K 4.5 4.6 5.1  CL 104 102 103   CO2 22 26 27   GLUCOSE 99 94 80  BUN 18 21 24   CREATININE 1.30* 1.32* 1.16  CALCIUM 9.0 9.0 9.3  TSH  --  0.97  --    Liver Function Tests: Recent Labs    11/03/22 1121 06/06/23 1448  AST 17 13  ALT 14 20  BILITOT 0.3 0.4  PROT 6.0* 5.9*   No results for input(s): "LIPASE", "AMYLASE" in the last 8760 hours. No results for input(s): "AMMONIA" in the last 8760 hours. CBC: Recent Labs    06/06/23 1448 07/08/23 1403 08/03/23 1426  WBC 13.4* 9.2 9.1  NEUTROABS 9,929* 6,624 6,243  HGB 14.2 13.1* 14.0  HCT 42.7 39.4 43.7  MCV 87.1 87.9 90.1  PLT 314 284 288   Lipid Panel: Recent Labs    11/03/22 1121  CHOL 173  HDL 48  LDLCALC 98  TRIG 173*  CHOLHDL 3.6   TSH: Recent Labs    01/31/23 1350  TSH 0.97   A1C: No results found for: "HGBA1C"   Assessment/Plan 1. Anticoagulant long-term use - POC INR goa 3-3.5 at goal today at 3.3.   2. H/O aortic valve replacement Continue on coumadin.   3. Sebaceous cyst Getting worse however he has not used warm compresses.  Will have him apply epsom salt compresses TID for ~20 mins until improving If not improving or worsening over the next few days will see him back for re-evaluation and possible modified I&D, discussed trying to avoid  due to risk of increase bleeding due to coumadin and do not want to hold coumadin due to risk of CVA/TIA which has happened previously.   4 days if no improvement 4 weeks for INR follow up Cambree Hendrix K. Biagio Borg [facility] [phone]

## 2023-09-19 NOTE — Patient Instructions (Signed)
Warm epsom salt soaks three times daily until improves Come back on Friday if does not improve or worsens.   HOLD coumadin 3.75 mg this Wednesday- give coumadin 3.5 mg daily until we know if you need area lanced.

## 2023-09-23 ENCOUNTER — Ambulatory Visit: Payer: Commercial Managed Care - PPO | Admitting: Nurse Practitioner

## 2023-10-17 ENCOUNTER — Other Ambulatory Visit: Payer: Self-pay | Admitting: Nurse Practitioner

## 2023-10-17 DIAGNOSIS — Z952 Presence of prosthetic heart valve: Secondary | ICD-10-CM

## 2023-10-21 ENCOUNTER — Encounter: Payer: Self-pay | Admitting: Nurse Practitioner

## 2023-10-21 ENCOUNTER — Ambulatory Visit (INDEPENDENT_AMBULATORY_CARE_PROVIDER_SITE_OTHER): Payer: Medicare Other | Admitting: Nurse Practitioner

## 2023-10-21 VITALS — BP 124/82 | HR 81 | Temp 97.9°F | Ht 68.0 in | Wt 185.0 lb

## 2023-10-21 DIAGNOSIS — Z952 Presence of prosthetic heart valve: Secondary | ICD-10-CM | POA: Diagnosis not present

## 2023-10-21 DIAGNOSIS — Z7901 Long term (current) use of anticoagulants: Secondary | ICD-10-CM | POA: Diagnosis not present

## 2023-10-21 DIAGNOSIS — L723 Sebaceous cyst: Secondary | ICD-10-CM

## 2023-10-21 DIAGNOSIS — R195 Other fecal abnormalities: Secondary | ICD-10-CM

## 2023-10-21 LAB — POCT INR: INR: 3.8 — AB (ref 2.0–3.0)

## 2023-10-21 NOTE — Progress Notes (Signed)
Careteam: Patient Care Team: Sharon Seller, NP as PCP - General (Geriatric Medicine)  PLACE OF SERVICE:  [facility]  Advanced Directive information    Allergies  Allergen Reactions   Aspirin     Other reaction(s): Other (See Comments) Can't take due to taking blood thinners   Morphine And Codeine Other (See Comments)    Other reaction(s): Other (See Comments) Hallucinations     Nitroglycerin     Severe headache   Promethazine Other (See Comments)    Other reaction(s): Other (See Comments) Hallucinations    Promethazine Hcl Other (See Comments)    Chief Complaint  Patient presents with   Anticoagulation    PT Inr check. No missed doses, do diet changes, no bleeding.      HPI: [name] for INR follow up.  Lab Results  Component Value Date   INR 3.8 (A) 10/21/2023   INR 3.3 09/19/2023   INR 4.7 (A) 09/02/2023   Continues on coumadin 3.5 mg by mouth daily then 3.75 every 3rd Wednesday No abnormal bruising or bleeding.  No LE swelling, no chest pains or shortness of breath   Cyst resolved with warm compresses  Review of Systems:  Review of Systems  Constitutional:  Negative for chills, fever and weight loss.  HENT:  Negative for tinnitus.   Respiratory:  Negative for cough, sputum production and shortness of breath.   Cardiovascular:  Negative for chest pain, palpitations and leg swelling.  Gastrointestinal:  Negative for abdominal pain, constipation, diarrhea and heartburn.  Genitourinary:  Negative for dysuria, frequency and urgency.  Musculoskeletal:  Negative for back pain, falls, joint pain and myalgias.  Skin: Negative.   Neurological:  Negative for dizziness and headaches.  Psychiatric/Behavioral:  Negative for depression and memory loss. The patient does not have insomnia.    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   Right Alar Crease   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   Right Temple (in situ) (curet and 5FU)   Past Surgical History:  Procedure Laterality Date   AORTIC VALVE REPLACEMENT     mechanical   BIOPSY  03/28/2021   Procedure: BIOPSY;  Surgeon: Bernette Redbird, MD;  Location: [facility];  Service: Endoscopy;;   BLADDER SURGERY  1983   Bladder Cancer Surgery    CATARACT EXTRACTION Right 06/30/2023   CATARACT EXTRACTION Left 06/16/2023   ESOPHAGOGASTRODUODENOSCOPY (EGD) WITH PROPOFOL N/A 03/28/2021   Procedure: ESOPHAGOGASTRODUODENOSCOPY (EGD) WITH PROPOFOL;  Surgeon: Bernette Redbird, MD;  Location: [facility];  Service: Endoscopy;  Laterality: N/A;   KNEE SURGERY Right 1980   KNEE SURGERY Left 1982   OTHER SURGICAL HISTORY  1992   stomach muscles removed due to Coumadin caused bleeding.   SKIN LESION EXCISION  Some cancerous   SKIN SURGERY  01/30/2022   Basal Cell on side of nose   TONSILLECTOMY  1965   Social History:   reports that he has never smoked. His smokeless tobacco use includes snuff. He reports that he does not currently use alcohol. He reports that he does not use drugs.  Family History  Problem Relation Age of Onset   Heart disease Neg Hx     Medications: Patient's Medications  New Prescriptions   No medications on file  Previous Medications   ACETAMINOPHEN (TYLENOL) 500 MG TABLET    Take 500 mg by mouth daily as needed for moderate pain.    ALBUTEROL (VENTOLIN HFA) 108 (90 BASE) MCG/ACT INHALER    Inhale 1 puff into the lungs every 6 (six) hours as needed for wheezing or shortness of breath.   AMOXICILLIN (AMOXIL) 500 MG CAPSULE    Take 1,000 mg by mouth 2 (two) times daily. Prior to dental procedure   CETIRIZINE (ZYRTEC) 10 MG TABLET    Take 10 mg by mouth daily.   CHOLECALCIFEROL (VITAMIN D-3 PO)    Take 1 tablet by mouth daily. Dose unknown   DOCUSATE SODIUM (COLACE) 100 MG CAPSULE    Take 100 mg by mouth as needed for mild constipation.   FAMOTIDINE (PEPCID) 20 MG TABLET    Take 1 tablet (20 mg total) by mouth daily.   FAMOTIDINE (PEPCID) 20 MG TABLET    Take 20 mg by mouth as needed for heartburn or indigestion.   FAMOTIDINE (PEPCID) 20 MG TABLET    Take 20 mg by mouth as needed for heartburn or indigestion.   FEROSUL 325 (65 FE) MG TABLET    TAKE 1 TABLET BY MOUTH DAILY   FLUTICASONE (FLONASE) 50 MCG/ACT NASAL SPRAY    Place 1 spray into both nostrils daily.   FUROSEMIDE (LASIX) 20 MG TABLET    Take 1 tablet (20 mg total) by mouth daily as needed for fluid or edema (3lb weight gain).   LOVASTATIN (MEVACOR) 40 MG TABLET    TAKE 1 TABLET BY MOUTH AT BEDTIME   PANTOPRAZOLE (PROTONIX) 40 MG TABLET    TAKE 1 TABLET BY MOUTH TWICE A DAY   PROPYLENE GLYCOL (SYSTANE BALANCE) 0.6 % SOLN    Apply 1 drop to eye as needed (for dry eye).   RAMIPRIL (ALTACE) 10 MG CAPSULE    Take 1 capsule (10 mg total) by mouth 2 (two) times daily.   TRAZODONE (DESYREL) 50 MG TABLET    Take 0.5 tablets (25 mg total) by mouth at bedtime.   VITAMIN B-12 (CYANOCOBALAMIN) 1000 MCG TABLET    Take 1,000 mcg by mouth daily.   WARFARIN (COUMADIN) 1 MG TABLET    TAKE 1/2 TABLET BY MOUTH DAILY WITH 3MG  TO TOTAL 3.5 MG, EXCEPT EVERY OTHER MONDAY, TAKE 1 TABLET ALONG WITH 3MG  TO TOTAL 4MG  AS DIRECTED   WARFARIN (COUMADIN) 3 MG TABLET    TAKE 1 TABLET BY MOUTH DAILY ALONG WITH 0.5 MILLIGRAM TABLET FOR A TOTAL DOSE OF 3.5 MILLIGRAMS DAILY  Modified Medications   No  medications on file  Discontinued Medications   No medications on file    Physical Exam:  Vitals:   10/21/23 1325  BP: 124/82  Pulse: 81  Temp: 97.9 F (36.6 C)  TempSrc: Temporal  SpO2: 97%  Weight: 185 lb (83.9 kg)  Height: 5\' 8"  (1.727 m)   Body mass index is 28.13 kg/m. Wt Readings from Last 3 Encounters:  10/21/23 185 lb (83.9 kg)  09/19/23 183 lb (83 kg)  09/02/23 178 lb 12.8 oz (81.1 kg)    Physical Exam Constitutional:      General: He is not in acute distress.    Appearance: He is well-developed. He is not diaphoretic.  HENT:     Head: Normocephalic and atraumatic.     Right Ear: External ear normal.     Left Ear: External ear normal.     Mouth/Throat:     Pharynx: No oropharyngeal exudate.  Eyes:     Conjunctiva/sclera: Conjunctivae normal.     Pupils: Pupils are equal, round, and reactive to light.  Cardiovascular:     Rate and Rhythm: Normal rate and regular rhythm.     Heart sounds: Murmur heard.  Pulmonary:     Effort: Pulmonary effort is normal.     Breath sounds: Normal breath sounds.  Abdominal:     General: Bowel sounds are normal.     Palpations: Abdomen is soft.  Musculoskeletal:        General: No tenderness.     Cervical back: Normal range of motion and neck supple.     Right lower leg: No edema.     Left lower leg: No edema.  Skin:    General: Skin is warm and dry.  Neurological:     Mental Status: He is alert and oriented to person, place, and time.     Labs reviewed: Basic Metabolic Panel: Recent Labs    11/03/22 1121 01/31/23 1350 06/06/23 1448  NA 138 135 136  K 4.5 4.6 5.1  CL 104 102 103  CO2 22 26 27   GLUCOSE 99 94 80  BUN 18 21 24   CREATININE 1.30* 1.32* 1.16  CALCIUM 9.0 9.0 9.3  TSH  --  0.97  --    Liver Function Tests: Recent Labs    11/03/22 1121 06/06/23 1448  AST 17 13  ALT 14 20  BILITOT 0.3 0.4  PROT 6.0* 5.9*   No results for input(s): "LIPASE", "AMYLASE" in the last 8760 hours. No results  for input(s): "AMMONIA" in the last 8760 hours. CBC: Recent Labs    06/06/23 1448 07/08/23 1403 08/03/23 1426  WBC 13.4* 9.2 9.1  NEUTROABS 9,929* 6,624 6,243  HGB 14.2 13.1* 14.0  HCT 42.7 39.4 43.7  MCV 87.1 87.9 90.1  PLT 314 284 288   Lipid Panel: Recent Labs    11/03/22 1121  CHOL 173  HDL 48  LDLCALC 98  TRIG 173*  CHOLHDL 3.6   TSH: Recent Labs    01/31/23 1350  TSH 0.97   A1C: No results found for: "HGBA1C"   Assessment/Plan 1. Anticoagulant long-term use (Primary) - POC INR- 3.8, goal 3-3.5 will continue current regimen at this time No abnormal bleeding or bruising noted  2. H/O aortic valve replacement Continues on coumadin.   3. Sebaceous cyst Has resolved with compresses  4. Positive colorectal cancer screening using Cologuard test Had positive cologuard last year however last time he had colonoscopy had a stroke therefore he is not willing to take the risk of stopping coumadin for further evaluation. Recommended no further screening  with cologuard   4 weeks  Donella Pascarella K. Biagio Borg [facility] & Adult Medicine [phone]

## 2023-11-14 ENCOUNTER — Encounter: Payer: Self-pay | Admitting: Nurse Practitioner

## 2023-11-14 ENCOUNTER — Ambulatory Visit (INDEPENDENT_AMBULATORY_CARE_PROVIDER_SITE_OTHER): Payer: Medicare Other | Admitting: Nurse Practitioner

## 2023-11-14 VITALS — BP 110/76 | HR 88 | Temp 96.8°F | Ht 68.0 in | Wt 186.6 lb

## 2023-11-14 DIAGNOSIS — D6869 Other thrombophilia: Secondary | ICD-10-CM | POA: Diagnosis not present

## 2023-11-14 DIAGNOSIS — I10 Essential (primary) hypertension: Secondary | ICD-10-CM

## 2023-11-14 DIAGNOSIS — Z952 Presence of prosthetic heart valve: Secondary | ICD-10-CM | POA: Diagnosis not present

## 2023-11-14 DIAGNOSIS — Z7901 Long term (current) use of anticoagulants: Secondary | ICD-10-CM | POA: Diagnosis not present

## 2023-11-14 DIAGNOSIS — E782 Mixed hyperlipidemia: Secondary | ICD-10-CM

## 2023-11-14 DIAGNOSIS — N1831 Chronic kidney disease, stage 3a: Secondary | ICD-10-CM

## 2023-11-14 DIAGNOSIS — D509 Iron deficiency anemia, unspecified: Secondary | ICD-10-CM

## 2023-11-14 LAB — POCT INR: INR: 4.2 — AB (ref 2.0–3.0)

## 2023-11-14 MED ORDER — WARFARIN SODIUM 1 MG PO TABS: ORAL_TABLET | ORAL | 3 refills | Status: DC

## 2023-11-14 NOTE — Progress Notes (Signed)
 Careteam: Patient Care Team: Caro Harlene POUR, NP as PCP - General (Geriatric Medicine)  PLACE OF SERVICE:  [facility]  Advanced Directive information Does Patient Have a Medical Advance Directive?: Yes, Type of Advance Directive: Healthcare Power of Dana;Living will;Out of facility DNR (pink MOST or yellow form), Pre-existing out of facility DNR order (yellow form or pink MOST form): Yellow form placed in chart (order not valid for inpatient use), Does patient want to make changes to medical advance directive?: No - Patient declined  Allergies  Allergen Reactions   Aspirin     Other reaction(s): Other (See Comments) Can't take due to taking blood thinners   Morphine And Codeine Other (See Comments)    Other reaction(s): Other (See Comments) Hallucinations     Nitroglycerin      Severe headache   Promethazine Other (See Comments)    Other reaction(s): Other (See Comments) Hallucinations    Promethazine Hcl Other (See Comments)    Chief Complaint  Patient presents with   Anticoagulation    PT/INR check. No missed doses, no diet changes, and no unusual bleeding from nose or stool. Update dosing for coumadin  to reflection every 3rd Wednesday versus Monday. Refill 1 mg warfarin according to instructions      HPI: Patient is a 78 y.o. male for INR check  INR 4.2 today, took 3.75 mg last Wednesday and then takes 3.5 mg all other days.  No usual bleeding. No missed doses.  No LE edema, chest pains or shortness of breath.  No TIA like symptoms.   Lab Results  Component Value Date   INR 4.2 (A) 11/14/2023   INR 3.8 (A) 10/21/2023   INR 3.3 09/19/2023    Review of Systems:  Review of Systems  Constitutional:  Negative for chills, fever and weight loss.  HENT:  Negative for tinnitus.   Respiratory:  Negative for cough, sputum production and shortness of breath.   Cardiovascular:  Negative for chest pain, palpitations and leg swelling.  Gastrointestinal:  Negative  for abdominal pain, constipation, diarrhea and heartburn.  Genitourinary:  Negative for dysuria, frequency and urgency.  Musculoskeletal:  Negative for back pain, falls, joint pain and myalgias.  Skin: Negative.   Neurological:  Negative for dizziness and headaches.  Psychiatric/Behavioral:  Negative for depression and memory loss. The patient does not have insomnia.     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   Right Alar Crease   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   Right Temple (in situ) (curet and 5FU)   Past Surgical History:  Procedure Laterality Date   AORTIC VALVE REPLACEMENT     mechanical   BIOPSY  03/28/2021   Procedure: BIOPSY;  Surgeon: Donnald Charleston, MD;  Location: [facility];  Service: Endoscopy;;   BLADDER SURGERY  1983   Bladder Cancer Surgery    CATARACT EXTRACTION Right 06/30/2023   CATARACT EXTRACTION Left 06/16/2023   ESOPHAGOGASTRODUODENOSCOPY (EGD) WITH PROPOFOL  N/A 03/28/2021   Procedure: ESOPHAGOGASTRODUODENOSCOPY (EGD) WITH PROPOFOL ;  Surgeon: Donnald Charleston, MD;  Location: [facility];  Service: Endoscopy;  Laterality: N/A;   KNEE SURGERY Right 1980   KNEE SURGERY Left 1982   OTHER SURGICAL HISTORY  1992   stomach muscles removed due to Coumadin  caused bleeding.   SKIN LESION EXCISION     Some cancerous   SKIN SURGERY  01/30/2022    Basal Cell on side of nose   TONSILLECTOMY  1965   Social History:   reports that he has never smoked. His smokeless tobacco use includes snuff. He reports that he does not currently use alcohol . He reports that he does not use drugs.  Family History  Problem Relation Age of Onset   Heart disease Neg Hx     Medications: Patient's Medications  New Prescriptions   No medications on file  Previous Medications   ACETAMINOPHEN  (TYLENOL ) 500 MG TABLET    Take 500 mg by mouth daily as needed for moderate pain.   ALBUTEROL  (VENTOLIN  HFA) 108 (90 BASE) MCG/ACT INHALER    Inhale 1 puff into the lungs every 6 (six) hours as needed for wheezing or shortness of breath.   AMOXICILLIN  (AMOXIL ) 500 MG CAPSULE    Take 1,000 mg by mouth 2 (two) times daily. Prior to dental procedure   CETIRIZINE (ZYRTEC) 10 MG TABLET    Take 10 mg by mouth daily.   CHOLECALCIFEROL (VITAMIN D-3 PO)    Take 1 tablet by mouth daily. Dose unknown   DOCUSATE SODIUM (COLACE) 100 MG CAPSULE    Take 100 mg by mouth as needed for mild constipation.   FAMOTIDINE  (PEPCID ) 20 MG TABLET    Take 1 tablet (20 mg total) by mouth daily.   FAMOTIDINE  (PEPCID ) 20 MG TABLET    Take 20 mg by mouth as needed for heartburn or indigestion.   FAMOTIDINE  (PEPCID ) 20 MG TABLET    Take 20 mg by mouth as needed for heartburn or indigestion.   FEROSUL 325 (65 FE) MG TABLET    TAKE 1 TABLET BY MOUTH DAILY   FLUTICASONE  (FLONASE ) 50 MCG/ACT NASAL SPRAY    Place 1 spray into both nostrils daily.   FUROSEMIDE  (LASIX ) 20 MG TABLET    Take 1 tablet (20 mg total) by mouth daily as needed for fluid or edema (3lb weight gain).   LOVASTATIN  (MEVACOR ) 40 MG TABLET    TAKE 1 TABLET BY MOUTH AT BEDTIME   PANTOPRAZOLE  (PROTONIX ) 40 MG TABLET    TAKE 1 TABLET BY MOUTH TWICE A DAY   PROPYLENE GLYCOL (SYSTANE BALANCE) 0.6 % SOLN    Apply 1 drop to eye as needed (for dry eye).   RAMIPRIL  (ALTACE ) 10 MG CAPSULE    Take 1 capsule (10 mg total) by mouth 2 (two) times  daily.   TRAZODONE  (DESYREL ) 50 MG TABLET    Take 0.5 tablets (25 mg total) by mouth at bedtime.   TRAZODONE  (DESYREL ) 50 MG TABLET    1/4 of tablet at bedtime as needed   VITAMIN B-12 (CYANOCOBALAMIN) 1000 MCG TABLET    Take 1,000 mcg by mouth daily.   WARFARIN (COUMADIN ) 1 MG TABLET    TAKE 1/2 TABLET BY MOUTH DAILY WITH 3MG  TO TOTAL 3.5 MG, EXCEPT EVERY OTHER MONDAY, TAKE 1 TABLET ALONG WITH 3MG  TO TOTAL 4MG   AS DIRECTED   WARFARIN (COUMADIN ) 3 MG TABLET    TAKE 1 TABLET BY MOUTH DAILY ALONG WITH 0.5 MILLIGRAM TABLET FOR A TOTAL DOSE OF 3.5 MILLIGRAMS DAILY  Modified Medications   No medications on file  Discontinued Medications   No medications on file    Physical Exam:  Vitals:   11/14/23 0926  BP: 110/76  Pulse: 88  Temp: (!) 96.8 F (36 C)  TempSrc: Temporal  SpO2: 97%  Weight: 186 lb 9.6 oz (84.6 kg)  Height: 5' 8 (1.727 m)   Body mass index is 28.37 kg/m. Wt Readings from Last 3 Encounters:  11/14/23 186 lb 9.6 oz (84.6 kg)  10/21/23 185 lb (83.9 kg)  09/19/23 183 lb (83 kg)    Physical Exam Constitutional:      General: He is not in acute distress.    Appearance: He is well-developed. He is not diaphoretic.  HENT:     Head: Normocephalic and atraumatic.     Right Ear: External ear normal.     Left Ear: External ear normal.     Mouth/Throat:     Pharynx: No oropharyngeal exudate.  Eyes:     Conjunctiva/sclera: Conjunctivae normal.     Pupils: Pupils are equal, round, and reactive to light.  Cardiovascular:     Rate and Rhythm: Normal rate and regular rhythm.     Heart sounds: Normal heart sounds.  Pulmonary:     Effort: Pulmonary effort is normal.     Breath sounds: Normal breath sounds.  Abdominal:     General: Bowel sounds are normal.     Palpations: Abdomen is soft.  Musculoskeletal:        General: No tenderness.     Cervical back: Normal range of motion and neck supple.     Right lower leg: No edema.     Left lower leg: No edema.  Skin:     General: Skin is warm and dry.  Neurological:     Mental Status: He is alert and oriented to person, place, and time.     Labs reviewed: Basic Metabolic Panel: Recent Labs    01/31/23 1350 06/06/23 1448  NA 135 136  K 4.6 5.1  CL 102 103  CO2 26 27  GLUCOSE 94 80  BUN 21 24  CREATININE 1.32* 1.16  CALCIUM 9.0 9.3  TSH 0.97  --    Liver Function Tests: Recent Labs    06/06/23 1448  AST 13  ALT 20  BILITOT 0.4  PROT 5.9*   No results for input(s): LIPASE, AMYLASE in the last 8760 hours. No results for input(s): AMMONIA in the last 8760 hours. CBC: Recent Labs    06/06/23 1448 07/08/23 1403 08/03/23 1426  WBC 13.4* 9.2 9.1  NEUTROABS 9,929* 6,624 6,243  HGB 14.2 13.1* 14.0  HCT 42.7 39.4 43.7  MCV 87.1 87.9 90.1  PLT 314 284 288   Lipid Panel: No results for input(s): CHOL, HDL, LDLCALC, TRIG, CHOLHDL, LDLDIRECT in the last 8760 hours. TSH: Recent Labs    01/31/23 1350  TSH 0.97   A1C: No results found for: HGBA1C   Assessment/Plan 1. Anticoagulant long-term use (Primary) - POC INR- 4.2 goal is 3-3.5, no signs of bleeding Will continue current regimen at this time  2. H/O aortic valve replacement -continues on coumdin gal 3-3.5  3. Iron  deficiency anemia, unspecified iron  deficiency anemia type Continues on supplement - CBC with Differential/Platelet  4. 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 Mixed hyperlipidemia Continues on lovastatin  - Lipid panel - COMPLETE METABOLIC PANEL WITH GFR  7. Essential hypertension -Blood pressure well controlled, goal bp <140/90 Continue current medications and dietary modifications follow metabolic panel   4 weeks for INR check.   Amran Malter K. Caro BODILY [facility] [phone]

## 2023-11-15 LAB — CBC WITH DIFFERENTIAL/PLATELET
Absolute Lymphocytes: 1237 {cells}/uL (ref 850–3900)
Absolute Monocytes: 722 {cells}/uL (ref 200–950)
Basophils Absolute: 66 {cells}/uL (ref 0–200)
Basophils Relative: 0.8 %
Eosinophils Absolute: 722 {cells}/uL — ABNORMAL HIGH (ref 15–500)
Eosinophils Relative: 8.7 %
HCT: 43.1 % (ref 38.5–50.0)
Hemoglobin: 14.3 g/dL (ref 13.2–17.1)
MCH: 29.1 pg (ref 27.0–33.0)
MCHC: 33.2 g/dL (ref 32.0–36.0)
MCV: 87.6 fL (ref 80.0–100.0)
MPV: 10.6 fL (ref 7.5–12.5)
Monocytes Relative: 8.7 %
Neutro Abs: 5553 {cells}/uL (ref 1500–7800)
Neutrophils Relative %: 66.9 %
Platelets: 277 10*3/uL (ref 140–400)
RBC: 4.92 10*6/uL (ref 4.20–5.80)
RDW: 12.8 % (ref 11.0–15.0)
Total Lymphocyte: 14.9 %
WBC: 8.3 10*3/uL (ref 3.8–10.8)

## 2023-11-15 LAB — COMPLETE METABOLIC PANEL WITH GFR
AG Ratio: 1.6 (calc) (ref 1.0–2.5)
ALT: 13 U/L (ref 9–46)
AST: 16 U/L (ref 10–35)
Albumin: 3.6 g/dL (ref 3.6–5.1)
Alkaline phosphatase (APISO): 85 U/L (ref 35–144)
BUN: 14 mg/dL (ref 7–25)
CO2: 27 mmol/L (ref 20–32)
Calcium: 9.5 mg/dL (ref 8.6–10.3)
Chloride: 105 mmol/L (ref 98–110)
Creat: 1.23 mg/dL (ref 0.70–1.28)
Globulin: 2.2 g/dL (ref 1.9–3.7)
Glucose, Bld: 106 mg/dL — ABNORMAL HIGH (ref 65–99)
Potassium: 4.9 mmol/L (ref 3.5–5.3)
Sodium: 140 mmol/L (ref 135–146)
Total Bilirubin: 0.4 mg/dL (ref 0.2–1.2)
Total Protein: 5.8 g/dL — ABNORMAL LOW (ref 6.1–8.1)
eGFR: 61 mL/min/{1.73_m2} (ref >=60)

## 2023-11-15 LAB — LIPID PANEL
Cholesterol: 174 mg/dL (ref <200)
HDL: 47 mg/dL (ref >=40)
LDL Cholesterol (Calc): 96 mg/dL
Non-HDL Cholesterol (Calc): 127 mg/dL (ref <130)
Total CHOL/HDL Ratio: 3.7 (calc) (ref <5.0)
Triglycerides: 212 mg/dL — ABNORMAL HIGH (ref <150)

## 2023-12-06 ENCOUNTER — Encounter: Payer: Self-pay | Admitting: Nurse Practitioner

## 2023-12-06 ENCOUNTER — Other Ambulatory Visit: Payer: Medicare Other

## 2023-12-06 ENCOUNTER — Ambulatory Visit: Payer: Medicare Other | Admitting: Nurse Practitioner

## 2023-12-06 DIAGNOSIS — Z7901 Long term (current) use of anticoagulants: Secondary | ICD-10-CM

## 2023-12-06 DIAGNOSIS — Z0000 Encounter for general adult medical examination without abnormal findings: Secondary | ICD-10-CM

## 2023-12-06 NOTE — Progress Notes (Signed)
This service is provided via telemedicine  No vital signs collected/recorded due to the encounter was a telemedicine visit.   Location of patient (ex: home, work):  Home  Patient consents to a telephone visit:  Yes  Location of the provider (ex: office, home):  Office [place].   Name of any referring provider:  na  Names of all persons participating in the telemedicine service and their role in the encounter:  Corwin Levins, Patient, Nelda Severe, CMA, Abbey Chatters, NP  Time spent on call:  7:11

## 2023-12-06 NOTE — Progress Notes (Signed)
 Subjective:   Garrett Terry is a 78 y.o. male who presents for Medicare Annual/Subsequent preventive examination.  Visit Complete: Virtual I connected with  Garrett Terry on 12/06/23 by a video and audio enabled telemedicine application and verified that I am speaking with the correct person using two identifiers.  Patient Location: Home  Provider Location: Office/Clinic  I discussed the limitations of evaluation and management by telemedicine. The patient expressed understanding and agreed to proceed.  Vital Signs: Because this visit was a virtual/telehealth visit, some criteria may be missing or patient reported. Any vitals not documented were not able to be obtained and vitals that have been documented are patient reported.   Cardiac Risk Factors include: advanced age (>62men, >16 women);male gender;sedentary lifestyle;hypertension;dyslipidemia     Objective:    There were no vitals filed for this visit. There is no height or weight on file to calculate BMI.     12/06/2023   10:13 AM 11/14/2023    9:28 AM 08/03/2023   10:34 AM 07/08/2023   10:20 AM 04/29/2023   11:24 AM 04/01/2023    2:08 PM 01/03/2023    1:57 PM  Advanced Directives  Does Patient Have a Medical Advance Directive? Yes Yes Yes Yes Yes Yes Yes  Type of Estate Agent of [place];Out of facility DNR (pink MOST or yellow form);Living will Healthcare Power of [place];Living will;Out of facility DNR (pink MOST or yellow form) Healthcare Power of [place];Living will;Out of facility DNR (pink MOST or yellow form) Healthcare Power of [place];Living will;Out of facility DNR (pink MOST or yellow form) Healthcare Power of [place];Living will;Out of facility DNR (pink MOST or yellow form) Healthcare Power of [place];Living will;Out of facility DNR (pink MOST or yellow form) Healthcare Power of [place];Living will;Out of facility DNR (pink MOST or yellow form)  Does patient want to make changes to  medical advance directive? No - Patient declined No - Patient declined No - Patient declined No - Patient declined No - Patient declined No - Patient declined No - Patient declined  Copy of Healthcare Power of Attorney in Chart? Yes - validated most recent copy scanned in chart (See row information) Yes - validated most recent copy scanned in chart (See row information) Yes - validated most recent copy scanned in chart (See row information) Yes - validated most recent copy scanned in chart (See row information) Yes - validated most recent copy scanned in chart (See row information) Yes - validated most recent copy scanned in chart (See row information) Yes - validated most recent copy scanned in chart (See row information)  Pre-existing out of facility DNR order (yellow form or pink MOST form)  Yellow form placed in chart (order not valid for inpatient use) Yellow form placed in chart (order not valid for inpatient use) Yellow form placed in chart (order not valid for inpatient use) Yellow form placed in chart (order not valid for inpatient use) Yellow form placed in chart (order not valid for inpatient use) Yellow form placed in chart (order not valid for inpatient use)    Current Medications (verified) Outpatient Encounter Medications as of 12/06/2023  Medication Sig   acetaminophen  (TYLENOL ) 500 MG tablet Take 500 mg by mouth daily as needed for moderate pain.   albuterol  (VENTOLIN  HFA) 108 (90 Base) MCG/ACT inhaler Inhale 1 puff into the lungs every 6 (six) hours as needed for wheezing or shortness of breath.   amoxicillin  (AMOXIL ) 500 MG capsule Take 1,000 mg by mouth 2 (  two) times daily. Prior to dental procedure   cetirizine (ZYRTEC) 10 MG tablet Take 10 mg by mouth daily.   Cholecalciferol (VITAMIN D-3 PO) Take 1 tablet by mouth daily. Dose unknown   docusate sodium (COLACE) 100 MG capsule Take 100 mg by mouth as needed for mild constipation.   famotidine  (PEPCID ) 20 MG tablet Take 1 tablet (20  mg total) by mouth daily.   FEROSUL 325 (65 Fe) MG tablet TAKE 1 TABLET BY MOUTH DAILY   fluticasone  (FLONASE ) 50 MCG/ACT nasal spray Place 1 spray into both nostrils daily.   furosemide  (LASIX ) 20 MG tablet Take 1 tablet (20 mg total) by mouth daily as needed for fluid or edema (3lb weight gain).   lovastatin  (MEVACOR ) 40 MG tablet TAKE 1 TABLET BY MOUTH AT BEDTIME   pantoprazole  (PROTONIX ) 40 MG tablet TAKE 1 TABLET BY MOUTH TWICE A DAY   Propylene Glycol (SYSTANE BALANCE) 0.6 % SOLN Apply 1 drop to eye as needed (for dry eye).   ramipril  (ALTACE ) 10 MG capsule Take 1 capsule (10 mg total) by mouth 2 (two) times daily.   traZODone  (DESYREL ) 50 MG tablet 1/4 of tablet at bedtime as needed   vitamin B-12 (CYANOCOBALAMIN) 1000 MCG tablet Take 1,000 mcg by mouth daily.   warfarin (COUMADIN ) 1 MG tablet TAKE 1/2 TABLET BY MOUTH DAILY WITH 3MG  TO TOTAL 3.5 MG EXCEPT EVERY 3rd WEDNESDAY take 3/4 tablet to total 3.75   warfarin (COUMADIN ) 3 MG tablet TAKE 1 TABLET BY MOUTH DAILY ALONG WITH 0.5 MILLIGRAM TABLET FOR A TOTAL DOSE OF 3.5 MILLIGRAMS DAILY   No facility-administered encounter medications on file as of 12/06/2023.    Allergies (verified) Aspirin, Morphine and codeine, Nitroglycerin , Promethazine, and Promethazine hcl   History: 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   Right Alar Crease   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   Right [place] (in situ) (curet and 5FU)   Past Surgical History:  Procedure Laterality Date   AORTIC VALVE REPLACEMENT     mechanical   BIOPSY  03/28/2021   Procedure: BIOPSY;  Surgeon: Donnald Charleston, MD;  Location: [facility];  Service: Endoscopy;;   BLADDER SURGERY  1983   Bladder Cancer Surgery    CATARACT EXTRACTION Right 06/30/2023   CATARACT EXTRACTION Left 06/16/2023   ESOPHAGOGASTRODUODENOSCOPY (EGD) WITH PROPOFOL  N/A 03/28/2021   Procedure: ESOPHAGOGASTRODUODENOSCOPY (EGD) WITH PROPOFOL ;  Surgeon: Donnald Charleston, MD;  Location: [facility];  Service: Endoscopy;  Laterality: N/A;   KNEE SURGERY Right 1980   KNEE SURGERY Left 1982   OTHER SURGICAL HISTORY  1992   stomach muscles removed due to Coumadin  caused bleeding.   SKIN LESION EXCISION     Some cancerous   SKIN SURGERY  01/30/2022   Basal Cell on side of nose  TONSILLECTOMY  1965   Family History  Problem Relation Age of Onset   Heart disease Neg Hx    Social History   Socioeconomic History   Marital status: Widowed    Spouse name: Not on file   Number of children: Not on file   Years of education: Not on file   Highest education level: Master's degree (e.g., MA, MS, MEng, MEd, MSW, MBA)  Occupational History   Not on file  Tobacco Use   Smoking status: Never   Smokeless tobacco: Current    Types: Snuff   Tobacco comments:    2-3 pouches a day  Vaping Use   Vaping status: Never Used  Substance and Sexual Activity   Alcohol  use: Not Currently   Drug use: Never   Sexual activity: Not on file  Other Topics Concern   Not on file  Social History Narrative   Tobacco use, amount per day now: Dip 4 plugs per day.   Past tobacco use, amount per day:   How many years did you use tobacco: 40   Alcohol  use (drinks per week): 15-25 drinks.   Diet: poor   Do you drink/eat things with caffeine: Yes   Marital status: Widowed.                                  What year were you married? 1973   Do you live in a house, apartment, assisted living, condo, trailer, etc.? House   Is it one or more stories? One   How many persons live in your home? Three   Do you have pets in your home?( please list) No   Highest Level of education completed? PhD Candidate.   Current or past profession: Programmer, Systems, Principle, and Psychologist, Occupational.   Do you exercise?  No                                Type and how often?   Do you have a living will? Yes   Do you have a DNR form? Yes                                  If not, do you want to discuss one?   Do you have signed POA/HPOA forms?  Yes                      If so, please bring to you appointment      Do you have any difficulty bathing or dressing yourself?    Do you have any difficulty preparing food or eating?   Do you have any difficulty managing your medications?   Do you have any difficulty managing your finances?   Do you have any difficulty affording your medications?    Social Drivers of Corporate Investment Banker Strain: Low Risk  (10/17/2023)   Overall Financial Resource Strain (CARDIA)    Difficulty of Paying Living Expenses: Not very hard  Food Insecurity: No Food Insecurity (10/17/2023)   Hunger Vital Sign    Worried About Running Out of Food in the Last Year: Never true    Ran Out of Food in the Last Year: Never true  Transportation Needs: No Transportation Needs (10/17/2023)   PRAPARE - Transportation    Lack  of Transportation (Medical): No    Lack of Transportation (Non-Medical): No  Physical Activity: Insufficiently Active (10/17/2023)   Exercise Vital Sign    Days of Exercise per Week: 3 days    Minutes of Exercise per Session: 30 min  Stress: Stress Concern Present (10/17/2023)   [employer] of Occupational Health - Occupational Stress Questionnaire    Feeling of Stress : To some extent  Social Connections: Moderately Isolated (10/17/2023)   Social Connection and Isolation Panel  [NHANES]    Frequency of Communication with Friends and Family: Three times a week    Frequency of Social Gatherings with Friends and Family: Once a week    Attends Religious Services: 1 to 4 times per year    Active Member of [employer] or Organizations: No    Attends Banker Meetings: Not on file    Marital Status: Widowed    Tobacco Counseling Ready to quit: Not Answered Counseling given: Not Answered Tobacco comments: 2-3 pouches a day   Clinical Intake:  Pre-visit preparation completed: Yes  Pain : No/denies pain     BMI - recorded: 28 Nutritional Status: BMI 25 -29 Overweight Diabetes: No  How often do you need to have someone help you when you read instructions, pamphlets, or other written materials from your doctor or pharmacy?: 1 - Never         Activities of Daily Living    12/06/2023   10:15 AM  In your present state of health, do you have any difficulty performing the following activities:  Hearing? 1  Comment hearing aides  Vision? 0  Difficulty concentrating or making decisions? 0  Walking or climbing stairs? 0  Dressing or bathing? 0  Doing errands, shopping? 0  Preparing Food and eating ? N  Using the Toilet? N  In the past six months, have you accidently leaked urine? N  Do you have problems with loss of bowel control? N  Managing your Medications? N  Managing your Finances? N  Housekeeping or managing your Housekeeping? N    Patient Care Team: Caro Harlene POUR, NP as PCP - General (Geriatric Medicine)  Indicate any recent Medical Services you may have received from other than [facility] providers in the past year (date may be approximate).     Assessment:   This is a routine wellness examination for Garrett Terry.  Hearing/Vision screen No results found.   Goals Addressed   None    Depression Screen    12/06/2023   10:14 AM 08/03/2023    1:44 PM 06/06/2023    2:06 PM 11/30/2021    9:11 AM 11/19/2021   10:10 AM  PHQ 2/9 Scores  PHQ  - 2 Score 0 0 0 5   PHQ- 9 Score  0  9   Exception Documentation     Other- indicate reason in comment box  Not completed     Pt. was seeing someone,but didn't work out and looking for someone else.    Fall Risk    12/06/2023   10:14 AM 10/21/2023    1:29 PM 08/03/2023    1:44 PM 04/29/2023    1:21 PM 04/01/2023    1:57 PM  Fall Risk   Falls in the past year? 0 0 0 0 0  Number falls in past yr: 0 0 0 0 0  Injury with Fall? 0 0 0 0 0  Risk for fall due to :  No Fall Risks No Fall Risks No Fall Risks No  Fall Risks  Follow up  Falls evaluation completed Falls evaluation completed Falls evaluation completed Falls evaluation completed    MEDICARE RISK AT HOME: Medicare Risk at Home Any stairs in or around the home?: No Home free of loose throw rugs in walkways, pet beds, electrical cords, etc?: Yes Adequate lighting in your home to reduce risk of falls?: Yes Life alert?: No Use of a cane, walker or w/c?: No Grab bars in the bathroom?: Yes Shower chair or bench in shower?: Yes Elevated toilet seat or a handicapped toilet?: Yes  TIMED UP AND GO:  Was the test performed?  No    Cognitive Function:        12/06/2023   10:14 AM 12/02/2022   10:22 AM 11/19/2021   10:14 AM  6CIT Screen  What Year? 0 points 0 points 0 points  What month? 0 points 0 points 0 points  What time? 0 points 0 points 0 points  Count back from 20 0 points 0 points 0 points  Months in reverse 0 points 0 points 0 points  Repeat phrase 0 points 2 points 4 points  Total Score 0 points 2 points 4 points    Immunizations Immunization History  Administered Date(s) Administered   Fluad Quad(high Dose 65+) 07/01/2021, 07/12/2022   Fluad Trivalent(High Dose 65+) 07/08/2023   Influenza Inj Mdck Quad Pf 07/08/2016   Influenza Split 08/06/2011, 08/14/2012, 07/20/2013, 07/19/2014   Influenza, High Dose Seasonal PF 07/26/2017, 07/23/2019   Influenza, Seasonal, Injecte, Preservative Fre 07/28/2015    Influenza,inj,Quad PF,6+ Mos 08/21/2018, 08/11/2020   Moderna Covid-19 Fall Seasonal Vaccine 62yrs & older 01/13/2023   PFIZER(Purple Top)SARS-COV-2 Vaccination 11/01/2019, 11/20/2019, 09/24/2020   PNEUMOCOCCAL CONJUGATE-20 11/04/2023   Pfizer(Comirnaty)Fall Seasonal Vaccine 12 years and older 07/15/2023   Pneumococcal Conjugate-13 07/19/2014   Pneumococcal Polysaccharide-23 07/28/2015   Respiratory Syncytial Virus Vaccine,Recomb Aduvanted(Arexvy) 07/19/2022   Tdap 03/20/2020    TDAP status: Up to date  Flu Vaccine status: Up to date  Pneumococcal vaccine status: Up to date  Covid-19 vaccine status: Information provided on how to obtain vaccines.   Qualifies for Shingles Vaccine? Yes   Zostavax completed No   Shingrix Completed?: Yes  Screening Tests Health Maintenance  Topic Date Due   COVID-19 Vaccine (6 - 2024-25 season) 09/09/2023   Zoster Vaccines- Shingrix (1 of 2) 03/04/2024 (Originally 01/25/1965)   Medicare Annual Wellness (AWV)  12/05/2024   DTaP/Tdap/Td (2 - Td or Tdap) 03/20/2030   Pneumonia Vaccine 18+ Years old  Completed   INFLUENZA VACCINE  Completed   Hepatitis C Screening  Completed   HPV VACCINES  Aged Out   Fecal DNA (Cologuard)  Discontinued    Health Maintenance  Health Maintenance Due  Topic Date Due   COVID-19 Vaccine (6 - 2024-25 season) 09/09/2023    Colorectal cancer screening: No longer required.   Lung Cancer Screening: (Low Dose CT Chest recommended if Age 14-80 years, 20 pack-year currently smoking OR have quit w/in 15years.) does not qualify.   Lung Cancer Screening Referral: na Additional Screening:  Hepatitis C Screening: does qualify; Completed   Vision Screening: Recommended annual ophthalmology exams for early detection of glaucoma and other disorders of the eye. Is the patient up to date with their annual eye exam?  Yes  Who is the provider or what is the name of the office in which the patient attends annual eye exams? [facility]   If pt is not established with a provider, would they like to be referred to  a provider to establish care? No .   Dental Screening: Recommended annual dental exams for proper oral hygiene    Community Resource Referral / Chronic Care Management: CRR required this visit?  No   CCM required this visit?  No     Plan:     I have personally reviewed and noted the following in the patient's chart:   Medical and social history Use of alcohol , tobacco or illicit drugs  Current medications and supplements including opioid prescriptions. Patient is not currently taking opioid prescriptions. Functional ability and status Nutritional status Physical activity Advanced directives List of other physicians Hospitalizations, surgeries, and ER visits in previous 12 months Vitals Screenings to include cognitive, depression, and falls Referrals and appointments  In addition, I have reviewed and discussed with patient certain preventive protocols, quality metrics, and best practice recommendations. A written personalized care plan for preventive services as well as general preventive health recommendations were provided to patient.     Harlene MARLA An, NP   12/06/2023   After Visit Summary: (MyChart) Due to this being a telephonic visit, the after visit summary with patients personalized plan was offered to patient via MyChart

## 2023-12-07 ENCOUNTER — Encounter: Payer: Self-pay | Admitting: Nurse Practitioner

## 2023-12-07 LAB — PROTIME-INR
INR: 3.9 — ABNORMAL HIGH
Prothrombin Time: 37.8 s — ABNORMAL HIGH (ref 9.0–11.5)

## 2023-12-11 LAB — COLOGUARD: COLOGUARD: NEGATIVE

## 2023-12-12 ENCOUNTER — Encounter: Payer: Self-pay | Admitting: Nurse Practitioner

## 2023-12-12 ENCOUNTER — Ambulatory Visit (INDEPENDENT_AMBULATORY_CARE_PROVIDER_SITE_OTHER): Payer: Commercial Managed Care - PPO | Admitting: Nurse Practitioner

## 2023-12-12 VITALS — BP 118/80 | HR 64 | Temp 97.9°F | Ht 68.0 in | Wt 183.0 lb

## 2023-12-12 DIAGNOSIS — Z952 Presence of prosthetic heart valve: Secondary | ICD-10-CM

## 2023-12-12 DIAGNOSIS — J309 Allergic rhinitis, unspecified: Secondary | ICD-10-CM | POA: Diagnosis not present

## 2023-12-12 DIAGNOSIS — Z7901 Long term (current) use of anticoagulants: Secondary | ICD-10-CM | POA: Diagnosis not present

## 2023-12-12 LAB — POCT INR: INR: 4 — AB (ref 2.0–3.0)

## 2023-12-12 MED ORDER — FLUTICASONE PROPIONATE 50 MCG/ACT NA SUSP: 2.0000 | Freq: Two times a day (BID) | NASAL | 3 refills | Status: DC

## 2023-12-12 NOTE — Assessment & Plan Note (Signed)
 Stable, no shortness of breath or chest pains On chronic coumadin  therapy- goal 3-3.5

## 2023-12-12 NOTE — Progress Notes (Signed)
 Careteam: Patient Care Team: Verma Gobble, NP as PCP - General (Geriatric Medicine)  PLACE OF SERVICE:  [facility]  Advanced Directive information    Allergies  Allergen Reactions   Aspirin     Other reaction(s): Other (See Comments) Can't take due to taking blood thinners   Morphine And Codeine Other (See Comments)    Other reaction(s): Other (See Comments) Hallucinations     Nitroglycerin      Severe headache   Promethazine Other (See Comments)    Other reaction(s): Other (See Comments) Hallucinations    Promethazine Hcl Other (See Comments)    Chief Complaint  Patient presents with   Anticoagulation    PT/INR check. No missed doses, no diet changes, and no unusual bleeding. Left ear fullness and tinnitus.     HPI: pt is a 78 y.o. male for INR follow up,   INR today is 4.0, taking coumadin  3.5 mg daily except every 3rd Wednesday taking 3.75 mg.  He is due to take 3.75 mg in 2 days.  No abnormal bruising or bleeding noted.  No swelling to LE.  No chest pains, trouble breathing.   Not able to hear as well out of left ear.  ?wax or hearing aide    Review of Systems:  Review of Systems  Constitutional:  Negative for chills, fever and weight loss.  HENT:  Negative for tinnitus.   Respiratory:  Negative for cough, sputum production and shortness of breath.   Cardiovascular:  Negative for chest pain, palpitations and leg swelling.  Gastrointestinal:  Negative for abdominal pain, constipation, diarrhea and heartburn.  Genitourinary:  Negative for dysuria, frequency and urgency.  Musculoskeletal:  Negative for back pain, falls, joint pain and myalgias.  Skin: Negative.   Neurological:  Negative for dizziness and headaches.  Psychiatric/Behavioral:  Negative for depression and memory loss. The patient does not have insomnia.     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   Right Alar Crease   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   Right Temple (in situ) (curet and 5FU)   Past Surgical History:  Procedure Laterality Date   AORTIC VALVE REPLACEMENT     mechanical   BIOPSY  03/28/2021   Procedure: BIOPSY;  Surgeon: Lanita Pitman, MD;  Location: [facility];  Service: Endoscopy;;   BLADDER SURGERY  1983   Bladder Cancer Surgery    CATARACT EXTRACTION Right 06/30/2023   CATARACT EXTRACTION Left 06/16/2023   ESOPHAGOGASTRODUODENOSCOPY (EGD) WITH PROPOFOL  N/A 03/28/2021   Procedure: ESOPHAGOGASTRODUODENOSCOPY (EGD) WITH PROPOFOL ;  Surgeon: Lanita Pitman, MD;  Location: [facility];  Service: Endoscopy;  Laterality: N/A;   KNEE SURGERY Right 1980   KNEE SURGERY Left 1982   OTHER SURGICAL HISTORY  1992   stomach muscles removed due to Coumadin  caused  bleeding.   SKIN LESION EXCISION     Some cancerous   SKIN SURGERY  01/30/2022   Basal Cell on side of nose   TONSILLECTOMY  1965   Social History:   reports that he has never smoked. His smokeless tobacco use includes snuff. He reports that he does not currently use alcohol . He reports that he does not use drugs.  Family History  Problem Relation Age of Onset   Heart disease Neg Hx     Medications: Patient's Medications  New Prescriptions   No medications on file  Previous Medications   ACETAMINOPHEN  (TYLENOL ) 500 MG TABLET    Take 500 mg by mouth daily as needed for  moderate pain.   ALBUTEROL  (VENTOLIN  HFA) 108 (90 BASE) MCG/ACT INHALER    Inhale 1 puff into the lungs every 6 (six) hours as needed for wheezing or shortness of breath.   AMOXICILLIN  (AMOXIL ) 500 MG CAPSULE    Take 1,000 mg by mouth 2 (two) times daily. Prior to dental procedure   CETIRIZINE (ZYRTEC) 10 MG TABLET    Take 10 mg by mouth daily.   CHOLECALCIFEROL (VITAMIN D-3 PO)    Take 1 tablet by mouth daily. Dose unknown   DOCUSATE SODIUM (COLACE) 100 MG CAPSULE    Take 100 mg by mouth as needed for mild constipation.   FAMOTIDINE  (PEPCID ) 20 MG TABLET    Take 1 tablet (20 mg total) by mouth daily.   FEROSUL 325 (65 FE) MG TABLET    TAKE 1 TABLET BY MOUTH DAILY   FLUTICASONE  (FLONASE ) 50 MCG/ACT NASAL SPRAY    Place 1 spray into both nostrils daily.   FLUTICASONE  (FLONASE ) 50 MCG/ACT NASAL SPRAY    Place 2 sprays into both nostrils 2 (two) times daily.   FUROSEMIDE  (LASIX ) 20 MG TABLET    Take 1 tablet (20 mg total) by mouth daily as needed for fluid or edema (3lb weight gain).   LOVASTATIN  (MEVACOR ) 40 MG TABLET    TAKE 1 TABLET BY MOUTH AT BEDTIME   PANTOPRAZOLE  (PROTONIX ) 40 MG TABLET    TAKE 1 TABLET BY MOUTH TWICE A DAY   PROPYLENE GLYCOL (SYSTANE BALANCE) 0.6 % SOLN    Apply 1 drop to eye as needed (for dry eye).   RAMIPRIL  (ALTACE ) 10 MG CAPSULE    Take 1 capsule (10 mg total) by mouth 2 (two) times daily.   TRAZODONE  (DESYREL ) 50 MG TABLET    1/4 of tablet at bedtime as needed   VITAMIN B-12 (CYANOCOBALAMIN) 1000 MCG TABLET    Take 1,000 mcg by mouth daily.   WARFARIN (COUMADIN ) 1 MG TABLET    TAKE 1/2 TABLET BY MOUTH DAILY WITH 3MG  TO TOTAL 3.5 MG EXCEPT EVERY 3rd WEDNESDAY take 3/4 tablet to total 3.75   WARFARIN (COUMADIN ) 3 MG TABLET    TAKE 1 TABLET BY MOUTH DAILY ALONG WITH 0.5 MILLIGRAM TABLET FOR A TOTAL DOSE OF 3.5 MILLIGRAMS DAILY  Modified Medications   No medications on file  Discontinued Medications   No medications on file    Physical Exam:  Vitals:   12/09/23  1629  BP: 118/80  Pulse: 64  Temp: 97.9 F (36.6 C)  TempSrc: Temporal  Weight: 183 lb (83 kg)  Height: 5\' 8"  (1.727 m)   Body mass index is 27.83 kg/m. Wt Readings from Last 3 Encounters:  12/09/23 183 lb (83 kg)  11/14/23 186 lb 9.6 oz (84.6 kg)  10/21/23 185 lb (83.9 kg)  Physical Exam Constitutional:      General: He is not in acute distress.    Appearance: He is well-developed. He is not diaphoretic.  HENT:     Head: Normocephalic and atraumatic.     Right Ear: External ear normal.     Left Ear: External ear normal.     Mouth/Throat:     Pharynx: No oropharyngeal exudate.  Eyes:     Conjunctiva/sclera: Conjunctivae normal.     Pupils: Pupils are equal, round, and reactive to light.  Cardiovascular:     Rate and Rhythm: Normal rate and regular rhythm.     Heart sounds: Murmur heard.  Pulmonary:     Effort: Pulmonary effort is normal.     Breath sounds: Normal breath sounds.  Abdominal:     General: Bowel sounds are normal.     Palpations: Abdomen is soft.  Musculoskeletal:        General: No tenderness.     Cervical back: Normal range of motion and neck supple.     Right lower leg: No edema.     Left lower leg: No edema.  Skin:    General: Skin is warm and dry.  Neurological:     Mental Status: He is alert and oriented to person, place, and time.     Labs reviewed: Basic Metabolic Panel: Recent Labs    01/31/23 1350 06/06/23 1448 11/14/23 1005  NA 135 136 140  K 4.6 5.1 4.9  CL 102 103 105  CO2 26 27 27   GLUCOSE 94 80 106*  BUN 21 24 14   CREATININE 1.32* 1.16 1.23  CALCIUM 9.0 9.3 9.5  TSH 0.97  --   --    Liver Function Tests: Recent Labs    06/06/23 1448 11/14/23 1005  AST 13 16  ALT 20 13  BILITOT 0.4 0.4  PROT 5.9* 5.8*   No results for input(s): "LIPASE", "AMYLASE" in the last 8760 hours. No results for input(s): "AMMONIA" in the last 8760 hours. CBC: Recent Labs    07/08/23 1403 08/03/23 1426 11/14/23 1005  WBC 9.2 9.1  8.3  NEUTROABS 6,624 6,243 5,553  HGB 13.1* 14.0 14.3  HCT 39.4 43.7 43.1  MCV 87.9 90.1 87.6  PLT 284 288 277   Lipid Panel: Recent Labs    11/14/23 1005  CHOL 174  HDL 47  LDLCALC 96  TRIG 212*  CHOLHDL 3.7   TSH: Recent Labs    01/31/23 1350  TSH 0.97   A1C: No results found for: "HGBA1C"   Assessment/Plan  Assessment & Plan: Due to aortic valve replacement  INR slightly over goal- continues on coumadin  3.5 with coumadin  3.75 every 3rd Wednesday  Will have him continue coumadin  3.5 mg this week and take 3.75 mg the following Wednesday   Orders: -     POCT INR  H/O aortic valve replacement Assessment & Plan: Stable, no shortness of breath or chest pains On chronic coumadin  therapy- goal 3-3.5    Allergic rhinitis, unspecified seasonality, unspecified trigger Assessment & Plan: Stable, continues on flonase  1 spray into bilateral nares BID.   Orders: -     Fluticasone  Propionate; Place 2 sprays into both nostrils 2 (two) times daily.  Dispense: 16 g; Refill: 3     INR in 4 weeks   Gay Rape K. Denney [facility] [phone]

## 2023-12-12 NOTE — Patient Instructions (Addendum)
 To wait another week before taking coumadin  3.75 mg dose  Take on 12/21/2023

## 2023-12-12 NOTE — Assessment & Plan Note (Signed)
 Stable, continues on flonase  1 spray into bilateral nares BID.

## 2023-12-12 NOTE — Assessment & Plan Note (Signed)
 Due to aortic valve replacement  INR slightly over goal- continues on coumadin  3.5 with coumadin  3.75 every 3rd Wednesday  Will have him continue coumadin  3.5 mg this week and take 3.75 mg the following Wednesday

## 2024-01-03 ENCOUNTER — Other Ambulatory Visit: Payer: Self-pay | Admitting: Nurse Practitioner

## 2024-01-03 DIAGNOSIS — I10 Essential (primary) hypertension: Secondary | ICD-10-CM

## 2024-01-06 ENCOUNTER — Ambulatory Visit: Payer: Commercial Managed Care - PPO | Admitting: Nurse Practitioner

## 2024-01-06 ENCOUNTER — Encounter: Payer: Self-pay | Admitting: Nurse Practitioner

## 2024-01-06 VITALS — BP 118/76 | HR 94 | Temp 98.2°F | Resp 17 | Ht 68.0 in | Wt 184.0 lb

## 2024-01-06 DIAGNOSIS — D6869 Other thrombophilia: Secondary | ICD-10-CM | POA: Diagnosis not present

## 2024-01-06 DIAGNOSIS — Z7901 Long term (current) use of anticoagulants: Secondary | ICD-10-CM | POA: Diagnosis not present

## 2024-01-06 DIAGNOSIS — Z952 Presence of prosthetic heart valve: Secondary | ICD-10-CM

## 2024-01-06 LAB — POCT INR: POC INR: 3.6

## 2024-01-06 MED ORDER — WARFARIN SODIUM 1 MG PO TABS: ORAL_TABLET | ORAL | Status: AC

## 2024-01-06 MED ORDER — WARFARIN SODIUM 3 MG PO TABS: ORAL_TABLET | ORAL | Status: DC

## 2024-01-06 NOTE — Patient Instructions (Addendum)
 CHANGE COUMADIN TO 3.5 daily except 3.75 mg every 4th Wednesday  Give coumadin 3.75mg  next on March 19th

## 2024-01-06 NOTE — Progress Notes (Signed)
 Careteam: Patient Care Team: Sharon Seller, NP as PCP - General (Geriatric Medicine)  PLACE OF SERVICE:  [facility]  Advanced Directive information Does Patient Have a Medical Advance Directive?: Yes, Type of Advance Directive: Healthcare Power of [place];Out of facility DNR (pink MOST or yellow form);Living will, Does patient want to make changes to medical advance directive?: No - Patient declined  Allergies  Allergen Reactions   Aspirin     Other reaction(s): Other (See Comments) Can't take due to taking blood thinners   Morphine And Codeine Other (See Comments)    Other reaction(s): Other (See Comments) Hallucinations     Nitroglycerin     Severe headache   Promethazine Other (See Comments)    Other reaction(s): Other (See Comments) Hallucinations    Promethazine Hcl Other (See Comments)    Chief Complaint  Patient presents with   Medical Management of Chronic Issues    Patient is here for inr follow up    HPI: pt is a 78 y.o. male  for INR check INR today is 3.6, he has been taking coumadin 3.5mg  except 3.75mg  every 3rd Wednesday. At last visit INR was 4.0 so it was 4 weeks in between 3.75 mg doses.  There has been no abnormal bruising or bleeding noted No LE edema, shortness of breath, pain with breathing or chest pains.  Maintains consistent diet No changes in medications.   INR/Prothrombin Time Lab Results  Component Value Date   INR 4.0 (A) 12/12/2023   INR 3.9 (H) 12/06/2023   INR 4.2 (A) 11/14/2023           Review of Systems:  Review of Systems  Constitutional:  Negative for chills, fever and weight loss.  HENT:  Negative for tinnitus.   Respiratory:  Negative for cough, sputum production and shortness of breath.   Cardiovascular:  Negative for chest pain, palpitations and leg swelling.  Gastrointestinal:  Negative for abdominal pain, constipation, diarrhea and heartburn.  Genitourinary:  Negative for dysuria, frequency and urgency.   Musculoskeletal:  Negative for back pain, falls, joint pain and myalgias.  Skin: Negative.   Neurological:  Negative for dizziness and headaches.  Psychiatric/Behavioral:  Negative for depression and memory loss. The patient does not have insomnia.     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   Right Alar Crease   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   Right Temple (in situ) (curet and 5FU)   Past Surgical History:  Procedure Laterality Date   AORTIC VALVE REPLACEMENT     mechanical   BIOPSY  03/28/2021   Procedure: BIOPSY;  Surgeon: Bernette Redbird, MD;  Location: [facility];  Service: Endoscopy;;   BLADDER SURGERY  1983   Bladder Cancer Surgery    CATARACT EXTRACTION Right 06/30/2023  CATARACT EXTRACTION Left 06/16/2023   ESOPHAGOGASTRODUODENOSCOPY (EGD) WITH PROPOFOL N/A 03/28/2021   Procedure: ESOPHAGOGASTRODUODENOSCOPY (EGD) WITH PROPOFOL;  Surgeon: Bernette Redbird, MD;  Location: [facility];  Service: Endoscopy;  Laterality: N/A;   KNEE SURGERY Right 1980   KNEE SURGERY Left 1982   OTHER SURGICAL HISTORY  1992   stomach muscles removed due to Coumadin caused bleeding.   SKIN LESION EXCISION     Some cancerous   SKIN SURGERY  01/30/2022   Basal Cell on side of nose   TONSILLECTOMY  1965   Social History:   reports that he has never smoked. His smokeless  tobacco use includes snuff. He reports that he does not currently use alcohol. He reports that he does not use drugs.  Family History  Problem Relation Age of Onset   Heart disease Neg Hx     Medications: Patient's Medications  New Prescriptions   No medications on file  Previous Medications   ACETAMINOPHEN (TYLENOL) 500 MG TABLET    Take 500 mg by mouth daily as needed for moderate pain.   ALBUTEROL (VENTOLIN HFA) 108 (90 BASE) MCG/ACT INHALER    Inhale 1 puff into the lungs every 6 (six) hours as needed for wheezing or shortness of breath.   AMOXICILLIN (AMOXIL) 500 MG CAPSULE    Take 1,000 mg by mouth 2 (two) times daily. Prior to dental procedure   CETIRIZINE (ZYRTEC) 10 MG TABLET    Take 10 mg by mouth daily.   CHOLECALCIFEROL (VITAMIN D-3 PO)    Take 1 tablet by mouth daily. Dose unknown   DOCUSATE SODIUM (COLACE) 100 MG CAPSULE    Take 100 mg by mouth as needed for mild constipation.   FAMOTIDINE (PEPCID) 20 MG TABLET    Take 1 tablet (20 mg total) by mouth daily.   FEROSUL 325 (65 FE) MG TABLET    TAKE 1 TABLET BY MOUTH DAILY   FLUTICASONE (FLONASE) 50 MCG/ACT NASAL SPRAY    Place 2 sprays into both nostrils 2 (two) times daily.   FUROSEMIDE (LASIX) 20 MG TABLET    Take 1 tablet (20 mg total) by mouth daily as needed for fluid or edema (3lb weight gain).   LOVASTATIN (MEVACOR) 40 MG TABLET    TAKE 1 TABLET BY MOUTH AT BEDTIME   PANTOPRAZOLE (PROTONIX) 40 MG TABLET    TAKE 1 TABLET BY MOUTH TWICE A DAY   PROPYLENE GLYCOL (SYSTANE BALANCE) 0.6 % SOLN    Apply 1 drop to eye as needed (for dry eye).   RAMIPRIL (ALTACE) 10 MG CAPSULE    TAKE 1 CAPSULE BY MOUTH 2 TIMES A DAY   TRAZODONE (DESYREL) 50 MG TABLET    1/4 of tablet at bedtime as needed   VITAMIN B-12 (CYANOCOBALAMIN) 1000 MCG TABLET    Take 1,000 mcg by mouth daily.   WARFARIN (COUMADIN) 1 MG TABLET    TAKE 1/2 TABLET BY MOUTH DAILY WITH 3MG  TO TOTAL 3.5 MG EXCEPT EVERY 3rd WEDNESDAY take 3/4 tablet to total 3.75   WARFARIN  (COUMADIN) 3 MG TABLET    TAKE 1 TABLET BY MOUTH DAILY ALONG WITH 0.5 MILLIGRAM TABLET FOR A TOTAL DOSE OF 3.5 MILLIGRAMS DAILY  Modified Medications   No medications on file  Discontinued Medications   No medications on file    Physical Exam:  Vitals:   01/06/24 1054  BP: 118/76  Pulse: 94  Resp: 17  Temp: 98.2 F (36.8 C)  TempSrc: Temporal  SpO2: 99%  Weight: 184 lb (83.5 kg)  Height: 5\' 8"  (1.727 m)   Body mass index is 27.98 kg/m. Wt Readings from Last 3 Encounters:  01/06/24 184 lb (83.5 kg)  12/09/23 183 lb (83 kg)  11/14/23 186 lb 9.6 oz (84.6 kg)    Physical Exam Constitutional:      General: He is not in acute distress.    Appearance: He is well-developed. He is not diaphoretic.  HENT:     Head: Normocephalic and atraumatic.     Right Ear: External ear normal.     Left Ear: External ear normal.     Mouth/Throat:     Pharynx: No oropharyngeal exudate.  Eyes:     Conjunctiva/sclera: Conjunctivae normal.     Pupils: Pupils are equal, round, and reactive to light.  Cardiovascular:     Rate and Rhythm: Normal rate and regular rhythm.     Heart sounds: Murmur heard.  Pulmonary:     Effort: Pulmonary effort is normal.     Breath sounds: Normal breath sounds.  Abdominal:     General: Bowel sounds are normal.     Palpations: Abdomen is soft.  Musculoskeletal:        General: No tenderness.     Cervical back: Normal range of motion and neck supple.     Right lower leg: No edema.     Left lower leg: No edema.  Skin:    General: Skin is warm and dry.  Neurological:     Mental Status: He is alert and oriented to person, place, and time.     Labs reviewed: Basic Metabolic Panel: Recent Labs    01/31/23 1350 06/06/23 1448 11/14/23 1005  NA 135 136 140  K 4.6 5.1 4.9  CL 102 103 105  CO2 26 27 27   GLUCOSE 94 80 106*  BUN 21 24 14   CREATININE 1.32* 1.16 1.23  CALCIUM 9.0 9.3 9.5  TSH 0.97  --   --    Liver Function Tests: Recent Labs     06/06/23 1448 11/14/23 1005  AST 13 16  ALT 20 13  BILITOT 0.4 0.4  PROT 5.9* 5.8*   No results for input(s): "LIPASE", "AMYLASE" in the last 8760 hours. No results for input(s): "AMMONIA" in the last 8760 hours. CBC: Recent Labs    07/08/23 1403 08/03/23 1426 11/14/23 1005  WBC 9.2 9.1 8.3  NEUTROABS 6,624 6,243 5,553  HGB 13.1* 14.0 14.3  HCT 39.4 43.7 43.1  MCV 87.9 90.1 87.6  PLT 284 288 277   Lipid Panel: Recent Labs    11/14/23 1005  CHOL 174  HDL 47  LDLCALC 96  TRIG 212*  CHOLHDL 3.7   TSH: Recent Labs    01/31/23 1350  TSH 0.97   A1C: No results found for: "HGBA1C"   Assessment/Plan 1. Anticoagulant long-term use (Primary) - POC INR today 3.6 Goal 3-3.5 Will change dose of coumadin to 3.75 mg every 4th Wednesday and continues coumadin 3.5 mg daily otherwise.    2. H/O aortic valve replacement Stable, no worsening shortness of breath or chest pains Continues on coumadin  INR today 3.6 - warfarin (COUMADIN) 3 MG tablet; TAKE 1 TABLET BY MOUTH DAILY ALONG WITH 0.5 MILLIGRAM TABLET FOR A TOTAL DOSE OF 3.5 MILLIGRAMS DAILY  3. 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 Long-term (current) use of anticoagulants, INR goal 3-3.5 - warfarin (COUMADIN) 1 MG tablet; TAKE 1/2 TABLET BY MOUTH DAILY WITH 3MG  TO TOTAL 3.5 MG EXCEPT EVERY 4th WEDNESDAY take 3/4 tablet to total 3.75  Return in about 4 weeks (around 02/03/2024).  Janene Harvey. Biagio Borg [facility] [phone]

## 2024-02-03 ENCOUNTER — Encounter: Payer: Self-pay | Admitting: Nurse Practitioner

## 2024-02-03 ENCOUNTER — Ambulatory Visit (INDEPENDENT_AMBULATORY_CARE_PROVIDER_SITE_OTHER): Admitting: Nurse Practitioner

## 2024-02-03 VITALS — BP 100/76 | HR 90 | Temp 97.4°F | Resp 16 | Ht 68.0 in | Wt 182.4 lb

## 2024-02-03 DIAGNOSIS — Z952 Presence of prosthetic heart valve: Secondary | ICD-10-CM

## 2024-02-03 DIAGNOSIS — D6869 Other thrombophilia: Secondary | ICD-10-CM | POA: Diagnosis not present

## 2024-02-03 DIAGNOSIS — Z7901 Long term (current) use of anticoagulants: Secondary | ICD-10-CM

## 2024-02-03 LAB — POCT INR: POC INR: 4.1

## 2024-02-03 NOTE — Progress Notes (Signed)
 Careteam: Patient Care Team: Sharon Seller, NP as PCP - General (Geriatric Medicine)  PLACE OF SERVICE:  [facility] CLIN[facility]  Advanced Directive information    Allergies  Allergen Reactions   Aspirin     Other reaction(s): Other (See Comments) Can't take due to taking blood thinners   Morphine And Codeine Other (See Comments)    Other reaction(s): Other (See Comments) Hallucinations     Nitroglycerin     Severe headache   Promethazine Other (See Comments)    Other reaction(s): Other (See Comments) Hallucinations    Promethazine Hcl Other (See Comments)    Chief Complaint  Patient presents with   Anticoagulation    4 week follow up. Patient denies missing any doses.     HPI: pt is a 78 y.o. male for INR check  About 2 weeks ago thinks he may have doubled up on his medication INR today 4.1 no usual bleeding.  He is taking coumadin 3.5mg  daily except 3.75 mg every 4th Wednesday . Which he took 2 days ago.  No abnormal bruising or bleeding.    Review of Systems:  Review of Systems  Constitutional:  Negative for chills, fever and weight loss.  HENT:  Negative for tinnitus.   Respiratory:  Negative for cough, sputum production and shortness of breath.   Cardiovascular:  Negative for chest pain, palpitations and leg swelling.  Gastrointestinal:  Negative for abdominal pain, constipation, diarrhea and heartburn.  Genitourinary:  Negative for dysuria, frequency and urgency.  Musculoskeletal:  Negative for back pain, falls, joint pain and myalgias.  Skin: Negative.   Neurological:  Negative for dizziness and headaches.  Psychiatric/Behavioral:  Negative for depression and memory loss. The patient does not have insomnia.     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   Right Alar Crease   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   Right Temple (in situ) (curet and 5FU)   Past Surgical History:  Procedure Laterality Date   AORTIC VALVE REPLACEMENT     mechanical   BIOPSY  03/28/2021   Procedure: BIOPSY;  Surgeon: Bernette Redbird, MD;  Location: [facility];  Service: Endoscopy;;   BLADDER SURGERY  1983   Bladder Cancer Surgery    CATARACT EXTRACTION Right 06/30/2023   CATARACT EXTRACTION Left 06/16/2023   ESOPHAGOGASTRODUODENOSCOPY (EGD) WITH PROPOFOL N/A 03/28/2021   Procedure: ESOPHAGOGASTRODUODENOSCOPY (EGD) WITH PROPOFOL;  Surgeon: Bernette Redbird, MD;  Location: [facility];  Service: Endoscopy;  Laterality: N/A;   KNEE SURGERY Right 1980   KNEE SURGERY Left 1982   OTHER SURGICAL HISTORY  1992   stomach muscles removed due to Coumadin caused bleeding.   SKIN LESION EXCISION     Some cancerous   SKIN SURGERY  01/30/2022   Basal Cell on side of nose  TONSILLECTOMY  1965   Social History:   reports that he has never smoked. His smokeless tobacco use includes snuff. He reports that he does not currently use alcohol. He reports that he does not use drugs.  Family History  Problem Relation Age of Onset   Heart disease Neg Hx     Medications: Patient's Medications  New Prescriptions   No medications on file  Previous Medications   ACETAMINOPHEN (TYLENOL) 500 MG TABLET    Take 500 mg by mouth daily as needed for moderate pain.   ALBUTEROL (VENTOLIN HFA) 108 (90 BASE) MCG/ACT INHALER    Inhale 1 puff into the lungs every 6 (six) hours  as needed for wheezing or shortness of breath.   AMOXICILLIN (AMOXIL) 500 MG CAPSULE    Take 1,000 mg by mouth 2 (two) times daily. Prior to dental procedure   CETIRIZINE (ZYRTEC) 10 MG TABLET    Take 10 mg by mouth daily.   CHOLECALCIFEROL (VITAMIN D-3 PO)    Take 1 tablet by mouth daily. Dose unknown   DOCUSATE SODIUM (COLACE) 100 MG CAPSULE    Take 100 mg by mouth as needed for mild constipation.   FAMOTIDINE (PEPCID) 20 MG TABLET    Take 1 tablet (20 mg total) by mouth daily.   FEROSUL 325 (65 FE) MG TABLET    TAKE 1 TABLET BY MOUTH DAILY   FLUTICASONE (FLONASE) 50 MCG/ACT NASAL SPRAY    Place 2 sprays into both nostrils 2 (two) times daily.   FUROSEMIDE (LASIX) 20 MG TABLET    Take 1 tablet (20 mg total) by mouth daily as needed for fluid or edema (3lb weight gain).   LOVASTATIN (MEVACOR) 40 MG TABLET    TAKE 1 TABLET BY MOUTH AT BEDTIME   PANTOPRAZOLE (PROTONIX) 40 MG TABLET    TAKE 1 TABLET BY MOUTH TWICE A DAY   PROPYLENE GLYCOL (SYSTANE BALANCE) 0.6 % SOLN    Apply 1 drop to eye as needed (for dry eye).   RAMIPRIL (ALTACE) 10 MG CAPSULE    TAKE 1 CAPSULE BY MOUTH 2 TIMES A DAY   TRAZODONE (DESYREL) 50 MG TABLET    1/4 of tablet at bedtime as needed   VITAMIN B-12 (CYANOCOBALAMIN) 1000 MCG TABLET    Take 1,000 mcg by mouth daily.   WARFARIN (COUMADIN) 1 MG TABLET    TAKE 1/2 TABLET BY MOUTH DAILY WITH 3MG  TO TOTAL 3.5 MG EXCEPT EVERY 4th WEDNESDAY take 3/4 tablet to total 3.75   WARFARIN (COUMADIN) 3 MG TABLET    TAKE 1 TABLET BY MOUTH DAILY ALONG WITH 0.5 MILLIGRAM TABLET FOR A TOTAL DOSE OF 3.5 MILLIGRAMS DAILY  Modified Medications   No medications on file  Discontinued Medications   No medications on file    Physical Exam:  Vitals:   02/03/24 1046  BP: 100/76  Pulse: 90  Resp: 16  Temp: (!) 97.4 F (36.3 C)  SpO2: 97%  Weight: 182 lb 6.4 oz (82.7 kg)  Height: 5\' 8"  (1.727 m)   Body mass index is 27.73 kg/m. Wt Readings from Last 3 Encounters:  02/03/24 182 lb 6.4 oz  (82.7 kg)  01/06/24 184 lb (83.5 kg)  12/09/23 183 lb (83 kg)    Physical Exam Constitutional:      General: He is not in acute distress.    Appearance: He is well-developed. He is not diaphoretic.  HENT:     Head: Normocephalic and atraumatic.     Right Ear:  External ear normal.     Left Ear: External ear normal.     Mouth/Throat:     Pharynx: No oropharyngeal exudate.  Eyes:     Conjunctiva/sclera: Conjunctivae normal.     Pupils: Pupils are equal, round, and reactive to light.  Cardiovascular:     Rate and Rhythm: Normal rate and regular rhythm.     Heart sounds: Murmur heard.  Pulmonary:     Effort: Pulmonary effort is normal.     Breath sounds: Normal breath sounds.  Abdominal:     General: Bowel sounds are normal.     Palpations: Abdomen is soft.  Musculoskeletal:        General: No tenderness.     Cervical back: Normal range of motion and neck supple.     Right lower leg: No edema.     Left lower leg: No edema.  Skin:    General: Skin is warm and dry.  Neurological:     Mental Status: He is alert and oriented to person, place, and time.     Labs reviewed: Basic Metabolic Panel: Recent Labs    06/06/23 1448 11/14/23 1005  NA 136 140  K 5.1 4.9  CL 103 105  CO2 27 27  GLUCOSE 80 106*  BUN 24 14  CREATININE 1.16 1.23  CALCIUM 9.3 9.5   Liver Function Tests: Recent Labs    06/06/23 1448 11/14/23 1005  AST 13 16  ALT 20 13  BILITOT 0.4 0.4  PROT 5.9* 5.8*   No results for input(s): "LIPASE", "AMYLASE" in the last 8760 hours. No results for input(s): "AMMONIA" in the last 8760 hours. CBC: Recent Labs    07/08/23 1403 08/03/23 1426 11/14/23 1005  WBC 9.2 9.1 8.3  NEUTROABS 6,624 6,243 5,553  HGB 13.1* 14.0 14.3  HCT 39.4 43.7 43.1  MCV 87.9 90.1 87.6  PLT 284 288 277   Lipid Panel: Recent Labs    11/14/23 1005  CHOL 174  HDL 47  LDLCALC 96  TRIG 212*  CHOLHDL 3.7   TSH: No results for input(s): "TSH" in the last 8760  hours. A1C: No results found for: "HGBA1C"   Assessment/Plan 1. Anticoagulant long-term use (Primary) - POC INR today at 4.1 goal is 3-3.5 however thought that he doubled his medication 1-2 weeks ago causing elevated INR.  No abnormal bruising or bleeding.  Took additional 3.75 mg coumadin 2 days ago and will take 3.5mg  daily will follow up in 3 week to check another INR prior to additional 3.75 mg dosing   2. H/O aortic valve replacement Continues on coumadin for anticoagulation   3. 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  Janene Harvey. [facility]ag[facility] [phone]

## 2024-02-27 ENCOUNTER — Encounter: Payer: Self-pay | Admitting: Nurse Practitioner

## 2024-02-27 ENCOUNTER — Ambulatory Visit: Admitting: Nurse Practitioner

## 2024-02-27 VITALS — BP 122/66 | HR 61 | Temp 97.2°F | Ht 68.0 in | Wt 181.2 lb

## 2024-02-27 DIAGNOSIS — D6869 Other thrombophilia: Secondary | ICD-10-CM

## 2024-02-27 DIAGNOSIS — G5603 Carpal tunnel syndrome, bilateral upper limbs: Secondary | ICD-10-CM | POA: Insufficient documentation

## 2024-02-27 DIAGNOSIS — Z7901 Long term (current) use of anticoagulants: Secondary | ICD-10-CM | POA: Diagnosis not present

## 2024-02-27 DIAGNOSIS — F1011 Alcohol abuse, in remission: Secondary | ICD-10-CM

## 2024-02-27 DIAGNOSIS — F1021 Alcohol dependence, in remission: Secondary | ICD-10-CM

## 2024-02-27 DIAGNOSIS — B351 Tinea unguium: Secondary | ICD-10-CM

## 2024-02-27 DIAGNOSIS — Z952 Presence of prosthetic heart valve: Secondary | ICD-10-CM

## 2024-02-27 LAB — POCT INR: POC INR: 3

## 2024-02-27 NOTE — Assessment & Plan Note (Signed)
 Stable, continue under orthopedic care. Has had injections and use braces at night for support

## 2024-02-27 NOTE — Progress Notes (Signed)
 Careteam: Patient Care Team: Verma Gobble, NP as PCP - General (Geriatric Medicine)  PLACE OF SERVICE:  [facility]  Advanced Directive information    Allergies  Allergen Reactions   Aspirin     Other reaction(s): Other (See Comments) Can't take due to taking blood thinners   Morphine And Codeine Other (See Comments)    Other reaction(s): Other (See Comments) Hallucinations     Nitroglycerin      Severe headache   Promethazine Other (See Comments)    Other reaction(s): Other (See Comments) Hallucinations    Promethazine Hcl Other (See Comments)    Chief Complaint  Patient presents with   Medical Management of Chronic Issues    Medical Management of Chronic Issues. PT/INR Check    HPI:  Discussed the use of AI scribe software for clinical note transcription with the patient, who gave verbal consent to proceed.  History of Present Illness   Garrett Terry is a 78 year old male who presents for an INR check.  His current INR is 3.0, which is within the target range of 2.5 to 3.5. He is on warfarin, taking 3 mg to 3.5 mg daily, with a dose of 3.75 mg every fourth Wednesday. He last took the 3.75 mg dose last Wednesday. Previously, his INR was 4.1 on April 4th. No chest pain, trouble breathing, or TIA-like symptoms such as unilateral numbness, slurred speech, or unilateral weakness.  He experiences neuropathy symptoms, specifically numbness in his hands and feet, primarily at night. The symptoms are tolerable.  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 He has been diagnosed with carpal tunnel syndrome.  He has a fungal infection in his toenails, particularly affecting the big right toe, which he describes as having a 'cave under that'. He is concerned about potential infection. He has tried over-the-counter treatments in the past without success and uses an Landscape architect to manage the nail thickness.        Lab Results  Component Value Date   INR 3.0 02/27/2024   INR 4.1 02/03/2024   INR 3.6 01/06/2024    Review of Systems:  Review of Systems  Constitutional:  Negative for chills, fever and weight loss.  HENT:  Negative for tinnitus.   Respiratory:  Negative for cough, sputum production and shortness of breath.   Cardiovascular:  Negative for chest pain, palpitations and leg swelling.  Gastrointestinal:  Negative for abdominal pain, constipation, diarrhea and heartburn.  Genitourinary:  Negative for dysuria, frequency and urgency.  Musculoskeletal:  Negative for back pain, falls, joint pain and myalgias.  Skin: Negative.   Neurological:  Positive for tingling. Negative for dizziness and headaches.  Psychiatric/Behavioral:  Negative for depression and memory loss. The patient does not have insomnia.     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   Right Alar Crease   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   Right Temple (in situ) (curet and 5FU)   Past Surgical History:  Procedure Laterality Date   AORTIC VALVE REPLACEMENT     mechanical   BIOPSY  03/28/2021   Procedure: BIOPSY;  Surgeon: Lanita Pitman, MD;  Location: [facility];  Service: Endoscopy;;   BLADDER SURGERY  1983   Bladder Cancer Surgery    CATARACT EXTRACTION Right 06/30/2023   CATARACT EXTRACTION  Left 06/16/2023   ESOPHAGOGASTRODUODENOSCOPY (EGD) WITH PROPOFOL  N/A 03/28/2021   Procedure: ESOPHAGOGASTRODUODENOSCOPY (EGD) WITH PROPOFOL ;  Surgeon: Lanita Pitman, MD;  Location: [facility];  Service: Endoscopy;  Laterality: N/A;   KNEE SURGERY Right 1980   KNEE SURGERY Left 1982   OTHER SURGICAL HISTORY  1992   stomach muscles removed due to Coumadin  caused bleeding.   SKIN LESION EXCISION     Some cancerous   SKIN SURGERY  01/30/2022   Basal Cell on side of nose   TONSILLECTOMY  1965   Social History:   reports that he has never smoked. His smokeless tobacco use includes snuff. He reports that he does not currently use alcohol . He reports that he does not use drugs.  Family History  Problem Relation Age of Onset   Heart disease Neg Hx     Medications: Patient's Medications  New Prescriptions   No medications on file  Previous Medications   ACETAMINOPHEN  (TYLENOL ) 500 MG TABLET    Take 500 mg by mouth daily as needed for moderate pain.   ALBUTEROL  (VENTOLIN  HFA) 108 (90 BASE) MCG/ACT INHALER    Inhale 1 puff into the lungs every 6 (six) hours as needed for wheezing or shortness of breath.   AMOXICILLIN  (AMOXIL ) 500 MG CAPSULE    Take 1,000 mg by mouth 2 (two) times daily. Prior to dental procedure   CETIRIZINE (ZYRTEC) 10 MG TABLET    Take 10 mg by mouth daily.   CHOLECALCIFEROL (VITAMIN D-3 PO)    Take 1 tablet by mouth daily. Dose unknown   DOCUSATE SODIUM (COLACE) 100 MG CAPSULE    Take 100 mg by mouth as needed for mild constipation.   FAMOTIDINE  (PEPCID ) 20 MG TABLET    Take 1 tablet (20 mg total) by mouth daily.   FEROSUL 325 (65 FE) MG TABLET    TAKE 1 TABLET BY MOUTH DAILY   FLUTICASONE  (FLONASE ) 50 MCG/ACT NASAL SPRAY    Place 2 sprays into both nostrils 2 (two) times daily.   FUROSEMIDE  (LASIX ) 20 MG TABLET    Take 1 tablet (20 mg total) by mouth daily as needed for fluid or edema (3lb weight gain).   LOVASTATIN  (MEVACOR ) 40 MG TABLET    TAKE 1 TABLET BY MOUTH AT  BEDTIME   PANTOPRAZOLE  (PROTONIX ) 40 MG TABLET    TAKE 1 TABLET BY MOUTH TWICE A DAY   PROPYLENE GLYCOL (SYSTANE BALANCE) 0.6 % SOLN    Apply 1 drop to eye as needed (for dry eye).   RAMIPRIL  (ALTACE ) 10 MG CAPSULE    TAKE 1 CAPSULE BY MOUTH 2 TIMES A DAY   TRAZODONE  (DESYREL ) 50 MG TABLET    1/4 of tablet at bedtime as needed   VITAMIN B-12 (CYANOCOBALAMIN) 1000 MCG TABLET    Take 1,000 mcg by mouth daily.   WARFARIN (COUMADIN ) 1 MG TABLET    TAKE 1/2 TABLET BY MOUTH DAILY  WITH 3MG  TO TOTAL 3.5 MG EXCEPT EVERY 4th WEDNESDAY take 3/4 tablet to total 3.75   WARFARIN (COUMADIN ) 3 MG TABLET    TAKE 1 TABLET BY MOUTH DAILY ALONG WITH 0.5 MILLIGRAM TABLET FOR A TOTAL DOSE OF 3.5 MILLIGRAMS DAILY  Modified Medications   No medications on file  Discontinued Medications   No medications on file    Physical Exam:  Vitals:   02/27/24 1424  BP: 122/66  Pulse: 61  Temp: (!) 97.2 F (36.2 C)  SpO2: 100%  Weight: 181 lb 3.2 oz (82.2 kg)  Height: 5\' 8"  (1.727 m)   Body mass index is 27.55 kg/m. Wt Readings from Last 3 Encounters:  02/27/24 181 lb 3.2 oz (82.2 kg)  02/03/24 182 lb 6.4 oz (82.7 kg)  01/06/24 184 lb (83.5 kg)    Physical Exam Constitutional:      General: He is not in acute distress.    Appearance: He is well-developed. He is not diaphoretic.  HENT:     Head: Normocephalic and atraumatic.     Right Ear: External ear normal.     Left Ear: External ear normal.     Mouth/Throat:     Pharynx: No oropharyngeal exudate.  Eyes:     Conjunctiva/sclera: Conjunctivae normal.     Pupils: Pupils are equal, round, and reactive to light.  Cardiovascular:     Rate and Rhythm: Normal rate and regular rhythm.     Heart sounds: Murmur heard.  Pulmonary:     Effort: Pulmonary effort is normal.     Breath sounds: Normal breath sounds.  Abdominal:     General: Bowel sounds are normal.     Palpations: Abdomen is soft.  Musculoskeletal:        General: No tenderness.     Cervical  back: Normal range of motion and neck supple.     Right lower leg: No edema.     Left lower leg: No edema.  Feet:     Right foot:     Toenail Condition: Fungal disease present.    Left foot:     Toenail Condition: Fungal disease present. Skin:    General: Skin is warm and dry.  Neurological:     Mental Status: He is alert and oriented to person, place, and time.     Labs reviewed: Basic Metabolic Panel: Recent Labs    06/06/23 1448 11/14/23 1005  NA 136 140  K 5.1 4.9  CL 103 105  CO2 27 27  GLUCOSE 80 106*  BUN 24 14  CREATININE 1.16 1.23  CALCIUM 9.3 9.5   Liver Function Tests: Recent Labs    06/06/23 1448 11/14/23 1005  AST 13 16  ALT 20 13  BILITOT 0.4 0.4  PROT 5.9* 5.8*   No results for input(s): "LIPASE", "AMYLASE" in the last 8760 hours. No results for input(s): "AMMONIA" in the last 8760 hours. CBC: Recent Labs    07/08/23 1403 08/03/23 1426 11/14/23 1005  WBC 9.2 9.1 8.3  NEUTROABS 6,624 6,243 5,553  HGB 13.1* 14.0 14.3  HCT 39.4 43.7 43.1  MCV 87.9 90.1 87.6  PLT 284 288 277   Lipid Panel: Recent Labs    11/14/23 1005  CHOL 174  HDL 47  LDLCALC 96  TRIG 212*  CHOLHDL 3.7   TSH: No results for input(s): "TSH" in the last 8760 hours. A1C: No results found for: "HGBA1C"   Assessment/Plan  Anticoagulant long-term use Assessment & Plan: INR goal 3-3.5 at  goal today Continue current regimen  Orders: -     POCT INR  H/O aortic valve replacement Assessment & Plan: Without symptoms of shortness of breath, chest pains, LE edema. Continue on coumadin  at this time   Acquired thrombophilia [facility]) Assessment & Plan: Due to aortic valve replacement. Continue on coumadin  for anticoagulation    Bilateral carpal tunnel syndrome Assessment & Plan: Stable, continue under orthopedic care. Has had injections and use braces at night for support   Fungal toenail infection -     Ambulatory referral to Podiatry  Alcohol  dependence in  remission (HCC)  Alcohol  abuse, in remission Assessment & Plan: Continues to sustain from alcohol .      Follow up in 4 weeks prior to 3.75 mg coumadin  dosing  Chantavia Bazzle K. Denney Fisher[facility] [phone]

## 2024-02-27 NOTE — Assessment & Plan Note (Signed)
 Continue in remission and

## 2024-02-27 NOTE — Assessment & Plan Note (Signed)
 Without symptoms of shortness of breath, chest pains, LE edema. Continue on coumadin  at this time

## 2024-02-27 NOTE — Assessment & Plan Note (Signed)
 Continues to sustain from alcohol .

## 2024-02-27 NOTE — Assessment & Plan Note (Signed)
 Due to aortic valve replacement. Continue on coumadin  for anticoagulation

## 2024-02-27 NOTE — Assessment & Plan Note (Signed)
 INR goal 3-3.5 at goal today Continue current regimen

## 2024-03-14 ENCOUNTER — Ambulatory Visit: Admitting: Podiatry

## 2024-03-14 ENCOUNTER — Encounter: Payer: Self-pay | Admitting: Physician Assistant

## 2024-03-14 ENCOUNTER — Telehealth: Admitting: Physician Assistant

## 2024-03-14 DIAGNOSIS — J069 Acute upper respiratory infection, unspecified: Secondary | ICD-10-CM | POA: Diagnosis not present

## 2024-03-14 MED ORDER — BENZONATATE 100 MG PO CAPS
100.0000 mg | ORAL_CAPSULE | Freq: Three times a day (TID) | ORAL | 0 refills | Status: AC | PRN
Start: 2024-03-14 — End: ?

## 2024-03-14 MED ORDER — DOXYCYCLINE HYCLATE 100 MG PO TABS: 100.0000 mg | ORAL_TABLET | Freq: Two times a day (BID) | ORAL | 0 refills | Status: DC

## 2024-03-14 NOTE — Patient Instructions (Signed)
 Garrett Terry, thank you for joining Hyla Maillard, PA-C for today's virtual visit.  While this provider is not your primary care provider (PCP), if your PCP is located in our provider database this encounter information will be shared with them immediately following your visit.   A [facility] MyChart account gives you access to today's visit and all your visits, tests, and labs performed at [facility] " click here if you don't have a [facility] MyChart account or go to [email or web address]  Consent: (Patient) Garrett Terry provided verbal consent for this virtual visit at the beginning of the encounter.  Current Medications:  Current Outpatient Medications:    acetaminophen  (TYLENOL ) 500 MG tablet, Take 500 mg by mouth daily as needed for moderate pain., Disp: , Rfl:    albuterol  (VENTOLIN  HFA) 108 (90 Base) MCG/ACT inhaler, Inhale 1 puff into the lungs every 6 (six) hours as needed for wheezing or shortness of breath., Disp: 18 g, Rfl: 1   amoxicillin  (AMOXIL ) 500 MG capsule, Take 1,000 mg by mouth 2 (two) times daily. Prior to dental procedure, Disp: , Rfl:    cetirizine (ZYRTEC) 10 MG tablet, Take 10 mg by mouth daily., Disp: , Rfl:    Cholecalciferol (VITAMIN D-3 PO), Take 1 tablet by mouth daily. Dose unknown, Disp: , Rfl:    docusate sodium (COLACE) 100 MG capsule, Take 100 mg by mouth as needed for mild constipation., Disp: , Rfl:    famotidine  (PEPCID ) 20 MG tablet, Take 1 tablet (20 mg total) by mouth daily., Disp: 30 tablet, Rfl: 1   FEROSUL 325 (65 Fe) MG tablet, TAKE 1 TABLET BY MOUTH DAILY, Disp: 180 tablet, Rfl: 1   fluticasone  (FLONASE ) 50 MCG/ACT nasal spray, Place 2 sprays into both nostrils 2 (two) times daily., Disp: 16 g, Rfl: 3   furosemide  (LASIX ) 20 MG tablet, Take 1 tablet (20 mg total) by mouth daily as needed for fluid or edema (3lb weight gain)., Disp: , Rfl:    lovastatin  (MEVACOR ) 40 MG tablet, TAKE 1 TABLET BY MOUTH AT BEDTIME,  Disp: 90 tablet, Rfl: 3   pantoprazole  (PROTONIX ) 40 MG tablet, TAKE 1 TABLET BY MOUTH TWICE A DAY, Disp: 180 tablet, Rfl: 3   Propylene Glycol (SYSTANE BALANCE) 0.6 % SOLN, Apply 1 drop to eye as needed (for dry eye)., Disp: , Rfl:    ramipril  (ALTACE ) 10 MG capsule, TAKE 1 CAPSULE BY MOUTH 2 TIMES A DAY, Disp: 180 capsule, Rfl: 1   traZODone  (DESYREL ) 50 MG tablet, 1/4 of tablet at bedtime as needed, Disp: , Rfl:    vitamin B-12 (CYANOCOBALAMIN) 1000 MCG tablet, Take 1,000 mcg by mouth daily., Disp: , Rfl:    warfarin (COUMADIN ) 1 MG tablet, TAKE 1/2 TABLET BY MOUTH DAILY WITH 3MG  TO TOTAL 3.5 MG EXCEPT EVERY 4th WEDNESDAY take 3/4 tablet to total 3.75, Disp: , Rfl:    warfarin (COUMADIN ) 3 MG tablet, TAKE 1 TABLET BY MOUTH DAILY ALONG WITH 0.5 MILLIGRAM TABLET FOR A TOTAL DOSE OF 3.5 MILLIGRAMS DAILY, Disp: , Rfl:    Medications ordered in this encounter:  No orders of the defined types were placed in this encounter.    *If you need refills on other medications prior to your next appointment, please contact your pharmacy*  Follow-Up: Call back or seek an in-person evaluation if the symptoms worsen or if the condition fails to improve as anticipated.  [facility] [phone]  Other Instructions Please take antibiotic as directed.  Increase fluid intake.  Use Saline nasal spray.  Take a daily multivitamin. Use the Tessalon for cough. Ok to continue your Claritin  as long as not making it hard to use the restroom.  Place a humidifier in the bedroom.  Please call or return clinic if symptoms are not improving.  Sinusitis Sinusitis is redness, soreness, and swelling (inflammation) of the paranasal sinuses. Paranasal sinuses are air pockets within the bones of your face (beneath the eyes, the middle of the forehead, or above the eyes). In healthy paranasal sinuses, mucus is able to drain out, and air is able to circulate through them by way of your nose. However, when your  paranasal sinuses are inflamed, mucus and air can become trapped. This can allow bacteria and other germs to grow and cause infection. Sinusitis can develop quickly and last only a short time (acute) or continue over a long period (chronic). Sinusitis that lasts for more than 12 weeks is considered chronic.  CAUSES  Causes of sinusitis include: Allergies. Structural abnormalities, such as displacement of the cartilage that separates your nostrils (deviated septum), which can decrease the air flow through your nose and sinuses and affect sinus drainage. Functional abnormalities, such as when the small hairs (cilia) that line your sinuses and help remove mucus do not work properly or are not present. SYMPTOMS  Symptoms of acute and chronic sinusitis are the same. The primary symptoms are pain and pressure around the affected sinuses. Other symptoms include: Upper toothache. Earache. Headache. Bad breath. Decreased sense of smell and taste. A cough, which worsens when you are lying flat. Fatigue. Fever. Thick drainage from your nose, which often is green and may contain pus (purulent). Swelling and warmth over the affected sinuses. DIAGNOSIS  Your caregiver will perform a physical exam. During the exam, your caregiver may: Look in your nose for signs of abnormal growths in your nostrils (nasal polyps). Tap over the affected sinus to check for signs of infection. View the inside of your sinuses (endoscopy) with a special imaging device with a light attached (endoscope), which is inserted into your sinuses. If your caregiver suspects that you have chronic sinusitis, one or more of the following tests may be recommended: Allergy tests. Nasal culture A sample of mucus is taken from your nose and sent to a lab and screened for bacteria. Nasal cytology A sample of mucus is taken from your nose and examined by your caregiver to determine if your sinusitis is related to an allergy. TREATMENT  Most  cases of acute sinusitis are related to a viral infection and will resolve on their own within 10 days. Sometimes medicines are prescribed to help relieve symptoms (pain medicine, decongestants, nasal steroid sprays, or saline sprays).  However, for sinusitis related to a bacterial infection, your caregiver will prescribe antibiotic medicines. These are medicines that will help kill the bacteria causing the infection.  Rarely, sinusitis is caused by a fungal infection. In theses cases, your caregiver will prescribe antifungal medicine. For some cases of chronic sinusitis, surgery is needed. Generally, these are cases in which sinusitis recurs more than 3 times per year, despite other treatments. HOME CARE INSTRUCTIONS  Drink plenty of water. Water helps thin the mucus so your sinuses can drain more easily. Use a humidifier. Inhale steam 3 to 4 times a day (for example, sit in the bathroom with the shower running). Apply a warm, moist washcloth to your face 3 to 4 times a day, or as directed by your caregiver. Use  saline nasal sprays to help moisten and clean your sinuses. Take over-the-counter or prescription medicines for pain, discomfort, or fever only as directed by your caregiver. SEEK IMMEDIATE MEDICAL CARE IF: You have increasing pain or severe headaches. You have nausea, vomiting, or drowsiness. You have swelling around your face. You have vision problems. You have a stiff neck. You have difficulty breathing. MAKE SURE YOU:  Understand these instructions. Will watch your condition. Will get help right away if you are not doing well or get worse. Document Released: 10/18/2005 Document Revised: 01/10/2012 Document Reviewed: 11/02/2011 [facility] Patient Information 2014 [place], [place].    If you have been instructed to have an in-person evaluation today at a local Urgent Care facility, please use the link below. It will take you to a list of all of our available [facility], including address, phone number and hours of operation. Please do not delay care.  [facility]  If you or a family member do not have a primary care provider, use the link below to schedule a visit and establish care. When you choose a [facility] primary care physician or advanced practice provider, you gain a long-term partner in health. Find a Primary Care Provider  Learn more about [facility]'s in-office and virtual care options:  - Get Care Now

## 2024-03-14 NOTE — Progress Notes (Signed)
 Virtual Visit Consent   [name], you are scheduled for a virtual visit with a [facility] provider today. Just as with appointments in the office, your consent must be obtained to participate. Your consent will be active for this visit and any virtual visit you may have with one of our providers in the next 365 days. If you have a MyChart account, a copy of this consent can be sent to you electronically.  As this is a virtual visit, video technology does not allow for your provider to perform a traditional examination. This may limit your provider's ability to fully assess your condition. If your provider identifies any concerns that need to be evaluated in person or the need to arrange testing (such as labs, EKG, etc.), we will make arrangements to do so. Although advances in technology are sophisticated, we cannot ensure that it will always work on either your end or our end. If the connection with a video visit is poor, the visit may have to be switched to a telephone visit. With either a video or telephone visit, we are not always able to ensure that we have a secure connection.  By engaging in this virtual visit, you consent to the provision of healthcare and authorize for your insurance to be billed (if applicable) for the services provided during this visit. Depending on your insurance coverage, you may receive a charge related to this service.  I need to obtain your verbal consent now. Are you willing to proceed with your visit today? Garrett Terry has provided verbal consent on 03/14/2024 for a virtual visit (video or telephone). Garrett Terry, [place]  Date: 03/14/2024 3:52 PM   Virtual Visit via Video Note   I, Garrett Terry, connected with  Garrett Terry  ([id number], July 29, 1946) on 03/14/24 at  3:15 PM EDT by a video-enabled telemedicine application and verified that I am speaking with the correct person using two identifiers.  Location: Patient: Virtual Visit  Location Patient: Home Provider: Virtual Visit Location Provider: Home Office   I discussed the limitations of evaluation and management by telemedicine and the availability of in person appointments. The patient expressed understanding and agreed to proceed.    History of Present Illness: Garrett Terry is a 78 y.o. who identifies as a male who was assigned male at birth, and is being seen today for nasal congestion, post-nasal drip, chest congestion with chest tightness. This AM with worsening symptoms including headache, sinus pressure, flush feeling and frontal sinus pain.  Fever 101.8. Denies recent travel or known sick contact but has been out and about daily in contact with others. Notes slight odor to urine first thing today without urgency, frequency, hesitancy, dysuria, hematuria. Has been hydrating well without further issue.  OTC - Claritin   Has home COVID test -- negative.  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   Acquired thrombophilia (HCC) 11/30/2021   Diverticulitis of sigmoid colon 04/18/2021   [place] grade B esophagitis 04/18/2021   Migraine headache with aura 04/18/2021   Alcohol  abuse, in remission 04/18/2021   Generalized anxiety disorder 04/18/2021   Essential hypertension 04/18/2021   Microcytic anemia 03/28/2021   Compression fracture of T12 vertebra (HCC) 03/28/2021   Closed compression fracture of body of L1 vertebra (HCC) 03/28/2021   Aortic atherosclerosis (  HCC) 03/28/2021   H/O aortic valve replacement 03/27/2021   Anxiety 10/27/2020   Asthma 05/09/2020   Erectile dysfunction 02/07/2020   Dysphagia 09/19/2016   History of cerebrovascular accident 07/23/2015   Allergic rhinitis 06/30/2015   Anticoagulant long-term use 03/22/2011   Gastroesophageal reflux disease  07/07/2010   Mixed hyperlipidemia 07/07/2010   Heart valve disorder 07/07/2010    Allergies:  Allergies  Allergen Reactions   Aspirin     Other reaction(s): Other (See Comments) Can't take due to taking blood thinners   Morphine And Codeine Other (See Comments)    Other reaction(s): Other (See Comments) Hallucinations     Nitroglycerin      Severe headache   Promethazine Other (See Comments)    Other reaction(s): Other (See Comments) Hallucinations    Promethazine Hcl Other (See Comments)   Medications:  Current Outpatient Medications:    benzonatate (TESSALON) 100 MG capsule, Take 1 capsule (100 mg total) by mouth 3 (three) times daily as needed for cough., Disp: 30 capsule, Rfl: 0   doxycycline (VIBRA-TABS) 100 MG tablet, Take 1 tablet (100 mg total) by mouth 2 (two) times daily., Disp: 14 tablet, Rfl: 0   acetaminophen  (TYLENOL ) 500 MG tablet, Take 500 mg by mouth daily as needed for moderate pain., Disp: , Rfl:    albuterol  (VENTOLIN  HFA) 108 (90 Base) MCG/ACT inhaler, Inhale 1 puff into the lungs every 6 (six) hours as needed for wheezing or shortness of breath., Disp: 18 g, Rfl: 1   amoxicillin  (AMOXIL ) 500 MG capsule, Take 1,000 mg by mouth 2 (two) times daily. Prior to dental procedure, Disp: , Rfl:    cetirizine (ZYRTEC) 10 MG tablet, Take 10 mg by mouth daily., Disp: , Rfl:    Cholecalciferol (VITAMIN D-3 PO), Take 1 tablet by mouth daily. Dose unknown, Disp: , Rfl:    docusate sodium (COLACE) 100 MG capsule, Take 100 mg by mouth as needed for mild constipation., Disp: , Rfl:    famotidine  (PEPCID ) 20 MG tablet, Take 1 tablet (20 mg total) by mouth daily., Disp: 30 tablet, Rfl: 1   FEROSUL 325 (65 Fe) MG tablet, TAKE 1 TABLET BY MOUTH DAILY, Disp: 180 tablet, Rfl: 1   fluticasone  (FLONASE ) 50 MCG/ACT nasal spray, Place 2 sprays into both nostrils 2 (two) times daily., Disp: 16 g, Rfl: 3   furosemide  (LASIX ) 20 MG tablet, Take 1 tablet (20 mg total) by mouth daily as  needed for fluid or edema (3lb weight gain)., Disp: , Rfl:    lovastatin  (MEVACOR ) 40 MG tablet, TAKE 1 TABLET BY MOUTH AT BEDTIME, Disp: 90 tablet, Rfl: 3   pantoprazole  (PROTONIX ) 40 MG tablet, TAKE 1 TABLET BY MOUTH TWICE A DAY, Disp: 180 tablet, Rfl: 3   Propylene Glycol (SYSTANE BALANCE) 0.6 % SOLN, Apply 1 drop to eye as needed (for dry eye)., Disp: , Rfl:    ramipril  (ALTACE ) 10 MG capsule, TAKE 1 CAPSULE BY MOUTH 2 TIMES A DAY, Disp: 180 capsule, Rfl: 1   traZODone  (DESYREL ) 50 MG tablet, 1/4 of tablet at bedtime as needed, Disp: , Rfl:    vitamin B-12 (CYANOCOBALAMIN) 1000 MCG tablet, Take 1,000 mcg by mouth daily., Disp: , Rfl:    warfarin (COUMADIN ) 1 MG tablet, TAKE 1/2 TABLET BY MOUTH DAILY WITH 3MG  TO TOTAL 3.5 MG EXCEPT EVERY 4th WEDNESDAY take 3/4 tablet to total 3.75, Disp: , Rfl:    warfarin (COUMADIN ) 3 MG tablet, TAKE 1 TABLET BY MOUTH DAILY ALONG WITH 0.5 MILLIGRAM TABLET  FOR A TOTAL DOSE OF 3.5 MILLIGRAMS DAILY, Disp: , Rfl:   Observations/Objective: Patient is well-developed, well-nourished in no acute distress.  Resting comfortably at home.  Head is normocephalic, atraumatic.  No labored breathing. Speech is clear and coherent with logical content.  Patient is alert and oriented at baseline.   Assessment and Plan: 1. Upper respiratory tract infection, unspecified type (Primary) - benzonatate (TESSALON) 100 MG capsule; Take 1 capsule (100 mg total) by mouth 3 (three) times daily as needed for cough.  Dispense: 30 capsule; Refill: 0 - doxycycline (VIBRA-TABS) 100 MG tablet; Take 1 tablet (100 mg total) by mouth 2 (two) times daily.  Dispense: 14 tablet; Refill: 0  Discussed viral versus bacterial etiologies. Giving history and new onset fever/sinus pain, will start Doxycycline for antibiotic coverage. Supportive measures and OTC medications reviewed. Tessalon per orders. Continue good hydration. Discussed urine smell was likely due to concentration as it resolved with  hydration and no other active urinary symptoms, but if any urinary symptoms develop he is to be evaluated in person.   Follow Up Instructions: I discussed the assessment and treatment plan with the patient. The patient was provided an opportunity to ask questions and all were answered. The patient agreed with the plan and demonstrated an understanding of the instructions.  A copy of instructions were sent to the patient via MyChart unless otherwise noted below.   The patient was advised to call back or seek an in-person evaluation if the symptoms worsen or if the condition fails to improve as anticipated.    Garrett Maillard, PA-C

## 2024-03-21 ENCOUNTER — Encounter: Payer: Self-pay | Admitting: Nurse Practitioner

## 2024-03-21 ENCOUNTER — Ambulatory Visit (INDEPENDENT_AMBULATORY_CARE_PROVIDER_SITE_OTHER): Admitting: Nurse Practitioner

## 2024-03-21 VITALS — BP 120/78 | HR 82 | Temp 98.3°F | Resp 20 | Ht 68.0 in | Wt 182.2 lb

## 2024-03-21 DIAGNOSIS — Z952 Presence of prosthetic heart valve: Secondary | ICD-10-CM | POA: Diagnosis not present

## 2024-03-21 DIAGNOSIS — Z7901 Long term (current) use of anticoagulants: Secondary | ICD-10-CM

## 2024-03-21 LAB — POCT INR: POC INR: 4.4

## 2024-03-21 NOTE — Progress Notes (Signed)
 Careteam: Patient Care Team: Verma Gobble, NP as PCP - General (Geriatric Medicine)  PLACE OF SERVICE:  [facility]  Advanced Directive information    Allergies  Allergen Reactions   Aspirin     Other reaction(s): Other (See Comments) Can't take due to taking blood thinners   Morphine And Codeine Other (See Comments)    Other reaction(s): Other (See Comments) Hallucinations     Nitroglycerin      Severe headache   Promethazine Other (See Comments)    Other reaction(s): Other (See Comments) Hallucinations    Promethazine Hcl Other (See Comments)    Chief Complaint  Patient presents with   Medical Management of Chronic Issues    INR check    HPI:  Discussed the use of AI scribe software for clinical note transcription with the patient, who gave verbal consent to proceed.  History of Present Illness Garrett Terry is a 78 year old male with aortic valve replacement who presents for a four-week INR check due to chronic Coumadin  use.  He is undergoing a routine INR check due to chronic Coumadin  use following an aortic valve replacement. His INR goal is between 3.0 and 3.5, but today's reading is 4.4. He was previously at 3.0 during his last check. There was no change in his dosing regimen, which was 3.75 mg last Wednesday instead of 3.5 mg. cardiologist advised maintaining it between 3.0 and 3.5 due to previous TIA-like symptoms when below 3.0.  He recently completed a course of antibiotics. He finished the antibiotic regimen last night. He recalls starting the antibiotics a week ago. Just completed a weeks course.  No signs of bleeding, blood loss, abnormal bruising, hematuria.    Review of Systems:  Review of Systems  Constitutional:  Negative for chills, fever and weight loss.  HENT:  Negative for tinnitus.   Respiratory:  Negative for cough, sputum production and shortness of breath.   Cardiovascular:  Negative for chest pain, palpitations and leg  swelling.  Gastrointestinal:  Negative for abdominal pain, constipation, diarrhea and heartburn.  Genitourinary:  Negative for dysuria, frequency and urgency.  Musculoskeletal:  Negative for back pain, falls, joint pain and myalgias.  Skin: Negative.   Neurological:  Negative for dizziness and headaches.  Psychiatric/Behavioral:  Negative for depression and memory loss. The patient does not have insomnia.     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   Right Alar Crease   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   Right Temple (in situ) (curet and 5FU)   Past Surgical History:  Procedure Laterality Date   AORTIC VALVE REPLACEMENT     mechanical   BIOPSY  03/28/2021   Procedure: BIOPSY;  Surgeon: Lanita Pitman, MD;  Location: [facility];  Service: Endoscopy;;   BLADDER SURGERY  1983   Bladder Cancer Surgery    CATARACT EXTRACTION Right 06/30/2023   CATARACT EXTRACTION Left 06/16/2023   ESOPHAGOGASTRODUODENOSCOPY (EGD) WITH PROPOFOL  N/A 03/28/2021   Procedure: ESOPHAGOGASTRODUODENOSCOPY (EGD) WITH PROPOFOL ;  Surgeon: Lanita Pitman, MD;  Location: [facility];  Service: Endoscopy;  Laterality: N/A;   KNEE SURGERY Right 1980   KNEE SURGERY Left 1982   OTHER SURGICAL HISTORY  1992   stomach muscles removed due to Coumadin  caused bleeding.   SKIN LESION EXCISION     Some  cancerous   SKIN SURGERY  01/30/2022   Basal Cell on side of nose   TONSILLECTOMY  1965   Social History:   reports that he has never smoked. His smokeless tobacco use includes snuff. He reports that he does not currently use alcohol . He reports that he does not use drugs.  Family History  Problem Relation Age of Onset   Heart disease Neg Hx     Medications: Patient's Medications  New Prescriptions   No medications on file  Previous Medications   ACETAMINOPHEN  (TYLENOL ) 500 MG TABLET    Take 500 mg by mouth daily as needed for moderate pain.   ALBUTEROL  (VENTOLIN  HFA) 108 (90 BASE) MCG/ACT INHALER    Inhale 1 puff into the lungs every 6 (six) hours as needed for wheezing or shortness of breath.   AMOXICILLIN  (AMOXIL ) 500 MG CAPSULE    Take 1,000 mg by mouth 2 (two) times daily. Prior to dental procedure   BENZONATATE  (TESSALON ) 100 MG CAPSULE    Take 1 capsule (100 mg total) by mouth 3 (three) times daily as needed for cough.   CETIRIZINE (ZYRTEC) 10 MG TABLET    Take 10 mg by mouth daily.   CHOLECALCIFEROL (VITAMIN D-3 PO)    Take 1 tablet by mouth daily. Dose unknown   DOCUSATE SODIUM (COLACE) 100 MG CAPSULE    Take 100 mg by mouth as needed for mild constipation.   DOXYCYCLINE  (VIBRA -TABS) 100 MG TABLET    Take 1 tablet (100 mg total) by mouth 2 (two) times daily.   FAMOTIDINE  (PEPCID ) 20 MG TABLET    Take 1 tablet (20 mg total) by mouth daily.   FEROSUL 325 (65 FE) MG TABLET    TAKE 1 TABLET BY MOUTH DAILY   FLUTICASONE  (FLONASE ) 50 MCG/ACT NASAL SPRAY    Place 2 sprays into both nostrils 2 (two) times daily.   FUROSEMIDE  (LASIX ) 20 MG TABLET    Take 1 tablet (20 mg total) by mouth daily as needed for fluid or edema (3lb weight gain).   LOVASTATIN  (MEVACOR ) 40 MG TABLET    TAKE 1 TABLET BY MOUTH AT BEDTIME   PANTOPRAZOLE  (PROTONIX ) 40 MG TABLET    TAKE 1 TABLET BY MOUTH TWICE A DAY   PROPYLENE GLYCOL (SYSTANE BALANCE) 0.6 % SOLN    Apply 1 drop to eye as needed (for dry eye).    RAMIPRIL  (ALTACE ) 10 MG CAPSULE    TAKE 1 CAPSULE BY MOUTH 2 TIMES A DAY   TRAZODONE  (DESYREL ) 50 MG TABLET    1/4 of tablet at bedtime as needed   VITAMIN B-12 (CYANOCOBALAMIN) 1000 MCG TABLET    Take 1,000 mcg by mouth daily.   WARFARIN (COUMADIN ) 1 MG TABLET    TAKE 1/2 TABLET BY MOUTH DAILY WITH 3MG  TO TOTAL 3.5 MG EXCEPT EVERY 4th WEDNESDAY take 3/4 tablet to total 3.75   WARFARIN (COUMADIN ) 3 MG TABLET    TAKE 1 TABLET BY MOUTH DAILY ALONG  WITH 0.5 MILLIGRAM TABLET FOR A TOTAL DOSE OF 3.5 MILLIGRAMS DAILY  Modified Medications   No medications on file  Discontinued Medications   No medications on file    Physical Exam:  Vitals:   03/21/24 1344  BP: 120/78  Pulse: 82  Resp: 20  Temp: 98.3 F (36.8 C)  SpO2: 98%  Weight: 182 lb 3.2 oz (82.6 kg)  Height: 5\' 8"  (1.727 m)   Body mass index is 27.7 kg/m. Wt Readings from Last 3 Encounters:  03/21/24 182 lb 3.2 oz (82.6 kg)  02/27/24 181 lb 3.2 oz (82.2 kg)  02/03/24 182 lb 6.4 oz (82.7 kg)    Physical Exam Constitutional:      General: He is not in acute distress.    Appearance: He is well-developed. He is not diaphoretic.  HENT:     Head: Normocephalic and atraumatic.     Right Ear: External ear normal.     Left Ear: External ear normal.     Mouth/Throat:     Pharynx: No oropharyngeal exudate.  Eyes:     Conjunctiva/sclera: Conjunctivae normal.     Pupils: Pupils are equal, round, and reactive to light.  Cardiovascular:     Rate and Rhythm: Normal rate and regular rhythm.     Heart sounds: Normal heart sounds.  Pulmonary:     Effort: Pulmonary effort is normal.     Breath sounds: Normal breath sounds.  Abdominal:     General: Bowel sounds are normal.     Palpations: Abdomen is soft.  Musculoskeletal:        General: No tenderness.     Cervical back: Normal range of motion and neck supple.     Right lower leg: No edema.     Left lower leg: No edema.  Skin:    General: Skin is warm and dry.  Neurological:      Mental Status: He is alert and oriented to person, place, and time.     Labs reviewed: Basic Metabolic Panel: Recent Labs    06/06/23 1448 11/14/23 1005  NA 136 140  K 5.1 4.9  CL 103 105  CO2 27 27  GLUCOSE 80 106*  BUN 24 14  CREATININE 1.16 1.23  CALCIUM 9.3 9.5   Liver Function Tests: Recent Labs    06/06/23 1448 11/14/23 1005  AST 13 16  ALT 20 13  BILITOT 0.4 0.4  PROT 5.9* 5.8*   No results for input(s): "LIPASE", "AMYLASE" in the last 8760 hours. No results for input(s): "AMMONIA" in the last 8760 hours. CBC: Recent Labs    07/08/23 1403 08/03/23 1426 11/14/23 1005  WBC 9.2 9.1 8.3  NEUTROABS 6,624 6,243 5,553  HGB 13.1* 14.0 14.3  HCT 39.4 43.7 43.1  MCV 87.9 90.1 87.6  PLT 284 288 277   Lipid Panel: Recent Labs    11/14/23 1005  CHOL 174  HDL 47  LDLCALC 96  TRIG 212*  CHOLHDL 3.7   TSH: No results for input(s): "TSH" in the last 8760 hours. A1C: No results found for: "HGBA1C"   Assessment/Plan Assessment and Plan Assessment & Plan Chronic Coumadin  use due to aortic valve replacement Chronic Coumadin  use for anticoagulation management due to aortic valve replacement. Current INR elevated at 4.4, likely due to recent antibiotic use. Goal INR range is 3.0 to 3.5 as per cardiologist's recommendation due to TIA symptoms when INR was below 3.0. - Continue current Coumadin  regimen without dose adjustment. - Monitor INR closely, especially  during antibiotic use. - If on antibiotics in the future, avoid increasing Coumadin  dose and check INR course.    Follow up in 4 weeks, sooner if needed  Shamariah Shewmake K. Denney Fisherman [facility] [phone]

## 2024-04-09 ENCOUNTER — Other Ambulatory Visit: Payer: Self-pay | Admitting: Nurse Practitioner

## 2024-04-09 DIAGNOSIS — Z952 Presence of prosthetic heart valve: Secondary | ICD-10-CM

## 2024-04-10 NOTE — Telephone Encounter (Signed)
 Pharmacy requested refill.  Pended Rx and sent to [facility] for approval.

## 2024-04-22 ENCOUNTER — Other Ambulatory Visit: Payer: Self-pay | Admitting: Nurse Practitioner

## 2024-04-23 ENCOUNTER — Ambulatory Visit (INDEPENDENT_AMBULATORY_CARE_PROVIDER_SITE_OTHER): Admitting: Nurse Practitioner

## 2024-04-23 ENCOUNTER — Encounter: Payer: Self-pay | Admitting: Nurse Practitioner

## 2024-04-23 VITALS — BP 114/80 | HR 82 | Temp 97.6°F | Ht 68.0 in | Wt 183.6 lb

## 2024-04-23 DIAGNOSIS — D6869 Other thrombophilia: Secondary | ICD-10-CM

## 2024-04-23 DIAGNOSIS — Z952 Presence of prosthetic heart valve: Secondary | ICD-10-CM | POA: Diagnosis not present

## 2024-04-23 DIAGNOSIS — Z7901 Long term (current) use of anticoagulants: Secondary | ICD-10-CM

## 2024-04-23 LAB — POCT INR: POC INR: 3.6

## 2024-04-23 NOTE — Assessment & Plan Note (Signed)
 Without symptoms of shortness of breath, chest pains, LE edema. Continue on coumadin  at this time

## 2024-04-23 NOTE — Assessment & Plan Note (Addendum)
 INR of 3.6 today. Due to aortic valve replacement. Continue on coumadin  for anticoagulation goal 3-3.5

## 2024-04-23 NOTE — Progress Notes (Signed)
 Careteam: Patient Care Team: Garrett Harlene POUR, NP as PCP - General (Geriatric Medicine)  PLACE OF SERVICE:  [facility]  Advanced Directive information    Allergies  Allergen Reactions   Aspirin     Other reaction(s): Other (See Comments) Can't take due to taking blood thinners   Morphine And Codeine Other (See Comments)    Other reaction(s): Other (See Comments) Hallucinations     Nitroglycerin      Severe headache   Promethazine Other (See Comments)    Other reaction(s): Other (See Comments) Hallucinations    Promethazine Hcl Other (See Comments)    Chief Complaint  Patient presents with   Medical Management of Chronic Issues    INR    HPI:  INR of 3.6 today.  Last inr 4.4 but had recently completed antibiotic Taking coumadin  3.5 mg daily except every 4th Wednesday 3.75 which was last Wednesday  Lab Results  Component Value Date   INR 3.6 04/23/2024   INR 4.4 03/21/2024   INR 3.0 02/27/2024     Review of Systems:  Review of Systems  Constitutional:  Negative for chills, fever and weight loss.  HENT:  Negative for tinnitus.   Respiratory:  Negative for cough, sputum production and shortness of breath.   Cardiovascular:  Negative for chest pain, palpitations and leg swelling.  Gastrointestinal:  Negative for abdominal pain, constipation, diarrhea and heartburn.  Genitourinary:  Negative for dysuria, frequency and urgency.  Musculoskeletal:  Negative for back pain, falls, joint pain and myalgias.  Skin: Negative.   Neurological:  Negative for dizziness and headaches.  Psychiatric/Behavioral:  Negative for depression and memory loss. The patient does not have insomnia.     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   Right Alar Crease   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   Right Temple (in situ) (curet and 5FU)   Past Surgical History:  Procedure Laterality Date   AORTIC VALVE REPLACEMENT     mechanical   BIOPSY  03/28/2021   Procedure: BIOPSY;  Surgeon: Donnald Charleston, MD;  Location: [facility];  Service: Endoscopy;;   BLADDER SURGERY  1983   Bladder Cancer Surgery    CATARACT EXTRACTION Right 06/30/2023   CATARACT EXTRACTION Left 06/16/2023   ESOPHAGOGASTRODUODENOSCOPY (EGD) WITH PROPOFOL  N/A 03/28/2021   Procedure: ESOPHAGOGASTRODUODENOSCOPY (EGD) WITH PROPOFOL ;  Surgeon: Donnald Charleston, MD;  Location: [facility];  Service: Endoscopy;  Laterality: N/A;   KNEE SURGERY Right 1980   KNEE SURGERY Left 1982   OTHER SURGICAL HISTORY  1992   stomach muscles removed due to Coumadin  caused bleeding.   SKIN LESION EXCISION     Some cancerous   SKIN SURGERY  01/30/2022   Basal Cell on side of nose   TONSILLECTOMY  1965   Social History:  reports that he has never smoked. His smokeless tobacco use includes snuff. He reports that he does not currently use alcohol . He reports that he does not use drugs.  Family History  Problem Relation Age of Onset   Heart disease Neg Hx     Medications: Patient's Medications  New Prescriptions   No medications on file  Previous Medications   ACETAMINOPHEN  (TYLENOL ) 500 MG TABLET    Take 500 mg by mouth daily as needed for moderate pain.   ALBUTEROL  (VENTOLIN  HFA) 108 (90 BASE) MCG/ACT INHALER    Inhale 1 puff into the lungs every 6 (six) hours as needed for wheezing or  shortness of breath.   AMOXICILLIN  (AMOXIL ) 500 MG CAPSULE    Take 1,000 mg by mouth 2 (two) times daily. Prior to dental procedure   BENZONATATE  (TESSALON ) 100 MG CAPSULE    Take 1 capsule (100 mg total) by mouth 3 (three) times daily as needed for cough.   CETIRIZINE (ZYRTEC) 10 MG TABLET    Take 10 mg by mouth daily.   CHOLECALCIFEROL (VITAMIN D-3 PO)    Take 1 tablet by mouth daily. Dose unknown   DOCUSATE SODIUM (COLACE) 100 MG CAPSULE    Take 100 mg by mouth as needed for mild constipation.   DOXYCYCLINE  (VIBRA -TABS) 100 MG TABLET    Take 1 tablet (100 mg total) by mouth 2 (two) times daily.   FAMOTIDINE  (PEPCID ) 20 MG TABLET    Take 1 tablet (20 mg total) by mouth daily.   FEROSUL 325 (65 FE) MG TABLET    TAKE 1 TABLET BY MOUTH DAILY   FLUTICASONE  (FLONASE ) 50 MCG/ACT NASAL SPRAY    Place 2 sprays into both nostrils 2 (two) times daily.   FUROSEMIDE  (LASIX ) 20 MG TABLET    Take 1 tablet (20 mg total) by mouth daily as needed for fluid or edema (3lb weight gain).   LOVASTATIN  (MEVACOR ) 40 MG TABLET    TAKE 1 TABLET BY MOUTH AT BEDTIME   PANTOPRAZOLE  (PROTONIX ) 40 MG TABLET    TAKE 1 TABLET BY MOUTH TWICE A DAY   PROPYLENE GLYCOL (SYSTANE BALANCE) 0.6 % SOLN    Apply 1 drop to eye as needed (for dry eye).   RAMIPRIL  (ALTACE ) 10 MG CAPSULE    TAKE 1 CAPSULE BY MOUTH 2 TIMES A DAY   TRAZODONE  (DESYREL ) 50 MG TABLET    TAKE 1/2 TABLET BY MOUTH AT BEDTIME   VITAMIN B-12 (CYANOCOBALAMIN) 1000 MCG TABLET    Take 1,000 mcg by mouth daily.   WARFARIN (COUMADIN ) 1 MG TABLET    TAKE 1/2 TABLET BY MOUTH DAILY WITH 3MG  TO TOTAL 3.5 MG EXCEPT EVERY 4th WEDNESDAY take 3/4 tablet to total 3.75   WARFARIN (COUMADIN ) 3 MG TABLET    TAKE 1 TABLET BY MOUTH DAILY ALONG WITH 0.5 MILLIGRAM TABLET FOR A TOTAL DOSE OF 3.5 MILIGRAMS AS DIRECTED BY DOCTOR  Modified Medications   No medications on file  Discontinued Medications   No medications on file    Physical Exam:  Vitals:   04/23/24 1452  BP: 114/80   Pulse: 82  Temp: 97.6 F (36.4 C)  SpO2: 95%  Weight: 183 lb 9.6 oz (83.3 kg)  Height: 5' 8 (1.727 m)   Body mass index is 27.92 kg/m. Wt Readings from Last 3 Encounters:  04/23/24 183 lb 9.6 oz (83.3 kg)  03/21/24 182 lb 3.2 oz (82.6 kg)  02/27/24 181 lb 3.2 oz (82.2 kg)  Physical Exam Constitutional:      General: He is not in acute distress.    Appearance: He is well-developed. He is not diaphoretic.  HENT:     Head: Normocephalic and atraumatic.     Right Ear: External ear normal.     Left Ear: External ear normal.     Mouth/Throat:     Pharynx: No oropharyngeal exudate.   Eyes:     Conjunctiva/sclera: Conjunctivae normal.     Pupils: Pupils are equal, round, and reactive to light.    Cardiovascular:     Rate and Rhythm: Normal rate and regular rhythm.     Heart sounds: Murmur heard.  Pulmonary:     Effort: Pulmonary effort is normal.     Breath sounds: Normal breath sounds.  Abdominal:     General: Bowel sounds are normal.     Palpations: Abdomen is soft.   Musculoskeletal:        General: No tenderness.     Cervical back: Normal range of motion and neck supple.     Right lower leg: No edema.     Left lower leg: No edema.   Skin:    General: Skin is warm and dry.   Neurological:     Mental Status: He is alert and oriented to person, place, and time.     Labs reviewed: Basic Metabolic Panel: Recent Labs    06/06/23 1448 11/14/23 1005  NA 136 140  K 5.1 4.9  CL 103 105  CO2 27 27  GLUCOSE 80 106*  BUN 24 14  CREATININE 1.16 1.23  CALCIUM 9.3 9.5   Liver Function Tests: Recent Labs    06/06/23 1448 11/14/23 1005  AST 13 16  ALT 20 13  BILITOT 0.4 0.4  PROT 5.9* 5.8*   No results for input(s): LIPASE, AMYLASE in the last 8760 hours. No results for input(s): AMMONIA in the last 8760 hours. CBC: Recent Labs    07/08/23 1403 08/03/23 1426 11/14/23 1005  WBC 9.2 9.1 8.3  NEUTROABS 6,624 6,243 5,553  HGB 13.1* 14.0 14.3   HCT 39.4 43.7 43.1  MCV 87.9 90.1 87.6  PLT 284 288 277   Lipid Panel: Recent Labs    11/14/23 1005  CHOL 174  HDL 47  LDLCALC 96  TRIG 212*  CHOLHDL 3.7   TSH: No results for input(s): TSH in the last 8760 hours. A1C: No results found for: HGBA1C   Assessment/Plan  Anticoagulated -     POCT INR 3.6 goal 3.0-3.5 No missed doses, medication changes or changes in diet.   H/O aortic valve replacement Assessment & Plan: Without symptoms of shortness of breath, chest pains, LE edema. 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 Due to aortic valve replacement. Continue on coumadin  for anticoagulation goal 3-3.5    4 weeks for INR check.  Andrus Sharp K. Garrett BODILY [facility] [phone]

## 2024-04-25 ENCOUNTER — Other Ambulatory Visit: Payer: Self-pay | Admitting: Nurse Practitioner

## 2024-05-23 ENCOUNTER — Encounter: Payer: Self-pay | Admitting: Nurse Practitioner

## 2024-05-23 ENCOUNTER — Ambulatory Visit (INDEPENDENT_AMBULATORY_CARE_PROVIDER_SITE_OTHER): Admitting: Nurse Practitioner

## 2024-05-23 VITALS — BP 110/72 | HR 79 | Temp 97.5°F | Resp 13 | Ht 68.0 in | Wt 184.2 lb

## 2024-05-23 DIAGNOSIS — Z7901 Long term (current) use of anticoagulants: Secondary | ICD-10-CM

## 2024-05-23 DIAGNOSIS — D509 Iron deficiency anemia, unspecified: Secondary | ICD-10-CM

## 2024-05-23 DIAGNOSIS — D6869 Other thrombophilia: Secondary | ICD-10-CM

## 2024-05-23 DIAGNOSIS — E041 Nontoxic single thyroid nodule: Secondary | ICD-10-CM | POA: Insufficient documentation

## 2024-05-23 DIAGNOSIS — J309 Allergic rhinitis, unspecified: Secondary | ICD-10-CM

## 2024-05-23 DIAGNOSIS — Z952 Presence of prosthetic heart valve: Secondary | ICD-10-CM

## 2024-05-23 DIAGNOSIS — N1831 Chronic kidney disease, stage 3a: Secondary | ICD-10-CM | POA: Insufficient documentation

## 2024-05-23 DIAGNOSIS — G629 Polyneuropathy, unspecified: Secondary | ICD-10-CM | POA: Insufficient documentation

## 2024-05-23 DIAGNOSIS — E782 Mixed hyperlipidemia: Secondary | ICD-10-CM

## 2024-05-23 DIAGNOSIS — I10 Essential (primary) hypertension: Secondary | ICD-10-CM

## 2024-05-23 LAB — POCT INR: INR: 4 — AB (ref 2.0–3.0)

## 2024-05-23 NOTE — Assessment & Plan Note (Signed)
 Continues on lovastatin  daily

## 2024-05-23 NOTE — Assessment & Plan Note (Signed)
 INR of 4 today. Taking coumadin  3.5 mg daily except every 4th Wednesday takes 3.75 mg Due to aortic valve replacement. Continue on coumadin  for anticoagulation goal 3-3.5

## 2024-05-23 NOTE — Assessment & Plan Note (Signed)
 Chronic and stable Encourage proper hydration Follow metabolic panel Avoid nephrotoxic meds (NSAIDS)

## 2024-05-23 NOTE — Assessment & Plan Note (Signed)
 Worse at this time, continues on flonase  1 spray into bilateral nares BID.  Could be due to more allergens in the home , To use netipot daily Can add mucinex DM twice daily for chest congestion and cough.

## 2024-05-23 NOTE — Progress Notes (Signed)
 Careteam: Patient Care Team: Caro Harlene POUR, NP as PCP - General (Geriatric Medicine)  PLACE OF SERVICE:  [facility]  Advanced Directive information    Allergies  Allergen Reactions   Aspirin     Other reaction(s): Other (See Comments) Can't take due to taking blood thinners   Morphine And Codeine Other (See Comments)    Other reaction(s): Other (See Comments) Hallucinations     Nitroglycerin      Severe headache   Promethazine Other (See Comments)    Other reaction(s): Other (See Comments) Hallucinations    Promethazine Hcl Other (See Comments)    Chief Complaint  Patient presents with   Coagulation Disorder    INR check up , // pt has congestions and neuropathy in feet.  No excessive bleeding / no missed doses.     HPI:  Discussed the use of AI scribe software for clinical note transcription with the patient, who gave verbal consent to proceed.  History of Present Illness Garrett Terry is a 78 year old male who presents for an INR check.  Today's INR reading is 4.0. He is taking Coumadin  at a dose of 3.5 mg daily, except every fourth Wednesday when he takes 3.75 mg. No unusual bruising or bleeding.  He has been experiencing chest congestion and nasal drainage over the past couple of weeks, which has worsened. He is unsure if this is due to environmental factors or allergies. He is currently taking Zyrtec in the morning and using Flonase  twice daily, which he feels helps except for morning congestion. He has also been using natural cough syrup every other day. He has not tried Mucinex DM yet. No chest pain or trouble breathing.  He reports neuropathy in his feet, particularly at night and in the morning, with occasional symptoms in his hands. He describes it as more of a sensation than pain and notes it is tolerable. No back pain. He mentions a history of low B12 levels.  Lab Results  Component Value Date   INR 4.0 (A) 05/23/2024   INR 3.6 04/23/2024    INR 4.4 03/21/2024     Review of Systems:  Review of Systems  Constitutional:  Negative for chills, fever and weight loss.  HENT:  Positive for congestion. Negative for tinnitus.   Respiratory:  Negative for cough, sputum production and shortness of breath.   Cardiovascular:  Negative for chest pain, palpitations and leg swelling.  Gastrointestinal:  Negative for abdominal pain, constipation, diarrhea and heartburn.  Genitourinary:  Negative for dysuria, frequency and urgency.  Musculoskeletal:  Negative for back pain, falls, joint pain and myalgias.  Skin: Negative.   Neurological:  Positive for tingling. Negative for dizziness and headaches.  Psychiatric/Behavioral:  Negative for depression and memory loss. The patient does not have insomnia.     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   Right Alar Crease   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   Right Temple (in situ) (curet and 5FU)   Past Surgical History:  Procedure Laterality Date   AORTIC VALVE REPLACEMENT     mechanical   BIOPSY  03/28/2021   Procedure: BIOPSY;  Surgeon: Donnald Charleston, MD;  Location: [facility];  Service: Endoscopy;;   BLADDER SURGERY  1983   Bladder Cancer Surgery    CATARACT EXTRACTION Right 06/30/2023   CATARACT EXTRACTION Left 06/16/2023   ESOPHAGOGASTRODUODENOSCOPY (EGD) WITH PROPOFOL   N/A 03/28/2021   Procedure: ESOPHAGOGASTRODUODENOSCOPY (EGD) WITH PROPOFOL ;  Surgeon: Donnald Charleston, MD;  Location: [facility];  Service: Endoscopy;  Laterality: N/A;   KNEE SURGERY Right 1980   KNEE SURGERY Left 1982   OTHER SURGICAL HISTORY  1992   stomach muscles removed due to Coumadin  caused bleeding.   SKIN LESION EXCISION     Some cancerous   SKIN SURGERY  01/30/2022   Basal Cell on side of nose   TONSILLECTOMY  1965   Social History:   reports that he has never smoked. His smokeless tobacco use includes snuff. He reports that he does not currently use alcohol . He reports that he does not use drugs.  Family History  Problem Relation Age of Onset   Heart disease Neg Hx     Medications: Patient's Medications  New Prescriptions   No medications on file  Previous Medications   ACETAMINOPHEN  (TYLENOL ) 500 MG TABLET    Take 500 mg by mouth daily as needed for moderate pain.   ALBUTEROL  (VENTOLIN  HFA) 108 (90 BASE) MCG/ACT INHALER    Inhale 1 puff into the lungs every 6 (six) hours as needed for wheezing or shortness of breath.   AMOXICILLIN  (AMOXIL ) 500 MG CAPSULE    Take 1,000 mg by mouth 2 (two) times daily. Prior to dental procedure   BENZONATATE  (TESSALON ) 100 MG CAPSULE    Take 1 capsule (100 mg total) by mouth 3 (three) times daily as needed for cough.   CETIRIZINE (ZYRTEC) 10 MG TABLET    Take 10 mg by mouth daily.   CHOLECALCIFEROL (VITAMIN D-3 PO)    Take 1 tablet by mouth daily. Dose unknown   DOCUSATE SODIUM (COLACE) 100 MG CAPSULE    Take 100 mg by mouth as needed for mild constipation.   DOXYCYCLINE  (VIBRA -TABS) 100 MG TABLET    Take 1 tablet (100 mg total) by mouth 2 (two) times daily.   FAMOTIDINE  (PEPCID ) 20 MG TABLET    Take 1 tablet (20 mg total) by mouth daily.   FEROSUL 325 (65 FE) MG TABLET    TAKE 1 TABLET BY MOUTH DAILY   FLUTICASONE  (FLONASE ) 50 MCG/ACT NASAL SPRAY    Place 2 sprays into both nostrils 2 (two) times daily.   FUROSEMIDE  (LASIX ) 20 MG  TABLET    Take 1 tablet (20 mg total) by mouth daily as needed for fluid or edema (3lb weight gain).   LOVASTATIN  (MEVACOR ) 40 MG TABLET    TAKE 1 TABLET BY MOUTH AT BEDTIME   PANTOPRAZOLE  (PROTONIX ) 40 MG TABLET    TAKE 1 TABLET BY MOUTH TWICE A DAY   PROPYLENE GLYCOL (SYSTANE BALANCE) 0.6 % SOLN    Apply 1 drop to eye as needed (for dry eye).   RAMIPRIL  (ALTACE ) 10 MG CAPSULE    TAKE 1 CAPSULE BY MOUTH 2 TIMES A DAY   TRAZODONE  (DESYREL ) 50 MG  TABLET    TAKE 1/2 TABLET BY MOUTH AT BEDTIME   VITAMIN B-12 (CYANOCOBALAMIN) 1000 MCG TABLET    Take 1,000 mcg by mouth daily.   WARFARIN (COUMADIN ) 1 MG TABLET    TAKE 1/2 TABLET BY MOUTH DAILY WITH 3MG  TO TOTAL 3.5 MG EXCEPT EVERY 4th WEDNESDAY take 3/4 tablet to total 3.75   WARFARIN (COUMADIN ) 3 MG TABLET    TAKE 1 TABLET BY MOUTH DAILY ALONG WITH 0.5 MILLIGRAM TABLET FOR A TOTAL DOSE OF 3.5 MILIGRAMS AS DIRECTED BY DOCTOR  Modified Medications   No medications on file  Discontinued Medications   No medications on file    Physical Exam:  Vitals:   05/23/24 1421  BP: 110/72  Pulse: 79  Resp: 13  Temp: (!) 97.5 F (36.4 C)  SpO2: 98%  Weight: 184 lb 3.2 oz (83.6 kg)  Height: 5' 8 (1.727 m)   Body mass index is 28.01 kg/m. Wt Readings from Last 3 Encounters:  05/23/24 184 lb 3.2 oz (83.6 kg)  04/23/24 183 lb 9.6 oz (83.3 kg)  03/21/24 182 lb 3.2 oz (82.6 kg)    Physical Exam Constitutional:      General: He is not in acute distress.    Appearance: He is well-developed. He is not diaphoretic.  HENT:     Head: Normocephalic and atraumatic.     Right Ear: External ear normal.     Left Ear: External ear normal.     Mouth/Throat:     Pharynx: No oropharyngeal exudate.  Eyes:     Conjunctiva/sclera: Conjunctivae normal.     Pupils: Pupils are equal, round, and reactive to light.  Cardiovascular:     Rate and Rhythm: Normal rate and regular rhythm.     Heart sounds: Murmur heard.  Pulmonary:     Effort: Pulmonary effort is  normal.     Breath sounds: Normal breath sounds.  Abdominal:     General: Bowel sounds are normal.     Palpations: Abdomen is soft.  Musculoskeletal:        General: No tenderness.     Cervical back: Normal range of motion and neck supple.     Right lower leg: No edema.     Left lower leg: No edema.  Skin:    General: Skin is warm and dry.  Neurological:     Mental Status: He is alert and oriented to person, place, and time.     Labs reviewed: Basic Metabolic Panel: Recent Labs    06/06/23 1448 11/14/23 1005  NA 136 140  K 5.1 4.9  CL 103 105  CO2 27 27  GLUCOSE 80 106*  BUN 24 14  CREATININE 1.16 1.23  CALCIUM 9.3 9.5   Liver Function Tests: Recent Labs    06/06/23 1448 11/14/23 1005  AST 13 16  ALT 20 13  BILITOT 0.4 0.4  PROT 5.9* 5.8*   No results for input(s): LIPASE, AMYLASE in the last 8760 hours. No results for input(s): AMMONIA in the last 8760 hours. CBC: Recent Labs    07/08/23 1403 08/03/23 1426 11/14/23 1005  WBC 9.2 9.1 8.3  NEUTROABS 6,624 6,243 5,553  HGB 13.1* 14.0 14.3  HCT 39.4 43.7 43.1  MCV 87.9 90.1 87.6  PLT 284 288 277   Lipid Panel: Recent Labs    11/14/23 1005  CHOL 174  HDL 47  LDLCALC 96  TRIG 212*  CHOLHDL 3.7   TSH: No results for input(s): TSH in the last  8760 hours. A1C: No results found for: HGBA1C   Assessment/Plan  Thyroid  nodule Assessment & Plan: Thyroid  nodule noted on imaging 1 year ago requires follow-up to assess for changes. - thyroid  ultrasound ordered  Orders: -     US  THYROID ; Future  Anticoagulated Assessment & Plan: Due to aortic valve replacement  INR goal 3-3.5 Continue current regimen  Orders: -     POCT INR  H/O aortic valve replacement Assessment & Plan: Without symptoms of shortness of breath, chest pains, LE edema. Continue on coumadin  at this time    Acquired thrombophilia [facility]) Assessment & Plan: INR of 4 today. Taking coumadin  3.5 mg daily except  every 4th Wednesday takes 3.75 mg Due to aortic valve replacement. Continue on coumadin  for anticoagulation goal 3-3.5   Allergic rhinitis, unspecified seasonality, unspecified trigger Assessment & Plan: Worse at this time, continues on flonase  1 spray into bilateral nares BID.  Could be due to more allergens in the home , To use netipot daily Can add mucinex DM twice daily for chest congestion and cough.    Iron  deficiency anemia, unspecified iron  deficiency anemia type Assessment & Plan: Continues on supplement Follow cbc  Orders: -     CBC with Differential/Platelet  Stage 3a chronic kidney disease (HCC) Assessment & Plan: Chronic and stable Encourage proper hydration Follow metabolic panel Avoid nephrotoxic meds (NSAIDS)  Orders: -     Comprehensive metabolic panel with GFR -     Vitamin B12  Mixed hyperlipidemia Assessment & Plan: Continues on lovastatin  daily  Orders: -     Comprehensive metabolic panel with GFR  Essential hypertension Assessment & Plan: Blood pressure well controlled, goal bp <140/90 Continue current medications and dietary modifications follow metabolic panel  Orders: -     CBC with Differential/Platelet -     Comprehensive metabolic panel with GFR  Neuropathy Assessment & Plan: Neuropathy in feet, tolerable symptoms, discuss possible causes and workup - Monitor symptoms for progression. - Check B12 levels.  Orders: -     Vitamin B12   Follow up for INR check In 3 weeks  Azyiah Bo K. Caro BODILY [facility] [phone]

## 2024-05-23 NOTE — Assessment & Plan Note (Signed)
 Without symptoms of shortness of breath, chest pains, LE edema. Continue on coumadin  at this time

## 2024-05-23 NOTE — Assessment & Plan Note (Signed)
 Thyroid  nodule noted on imaging 1 year ago requires follow-up to assess for changes. - thyroid  ultrasound ordered

## 2024-05-23 NOTE — Assessment & Plan Note (Signed)
 Continues on supplement Follow cbc

## 2024-05-23 NOTE — Assessment & Plan Note (Addendum)
 Due to aortic valve replacement  INR goal 3-3.5 Continue current regimen

## 2024-05-23 NOTE — Assessment & Plan Note (Signed)
 Blood pressure well controlled, goal bp <140/90 Continue current medications and dietary modifications follow metabolic panel

## 2024-05-23 NOTE — Assessment & Plan Note (Signed)
 Neuropathy in feet, tolerable symptoms, discuss possible causes and workup - Monitor symptoms for progression. - Check B12 levels.

## 2024-05-24 ENCOUNTER — Ambulatory Visit: Payer: Self-pay | Admitting: Nurse Practitioner

## 2024-05-24 LAB — CBC WITH DIFFERENTIAL/PLATELET
Absolute Lymphocytes: 1349 {cells}/uL (ref 850–3900)
Absolute Monocytes: 817 {cells}/uL (ref 200–950)
Basophils Absolute: 57 {cells}/uL (ref 0–200)
Basophils Relative: 0.6 %
Eosinophils Absolute: 608 {cells}/uL — ABNORMAL HIGH (ref 15–500)
Eosinophils Relative: 6.4 %
HCT: 43.4 % (ref 38.5–50.0)
Hemoglobin: 14.1 g/dL (ref 13.2–17.1)
MCH: 29.6 pg (ref 27.0–33.0)
MCHC: 32.5 g/dL (ref 32.0–36.0)
MCV: 91 fL (ref 80.0–100.0)
MPV: 10.3 fL (ref 7.5–12.5)
Monocytes Relative: 8.6 %
Neutro Abs: 6669 {cells}/uL (ref 1500–7800)
Neutrophils Relative %: 70.2 %
Platelets: 305 Thousand/uL (ref 140–400)
RBC: 4.77 Million/uL (ref 4.20–5.80)
RDW: 13.1 % (ref 11.0–15.0)
Total Lymphocyte: 14.2 %
WBC: 9.5 Thousand/uL (ref 3.8–10.8)

## 2024-05-24 LAB — COMPREHENSIVE METABOLIC PANEL WITH GFR
AG Ratio: 1.5 (calc) (ref 1.0–2.5)
ALT: 12 U/L (ref 9–46)
AST: 16 U/L (ref 10–35)
Albumin: 3.7 g/dL (ref 3.6–5.1)
Alkaline phosphatase (APISO): 87 U/L (ref 35–144)
BUN: 16 mg/dL (ref 7–25)
CO2: 28 mmol/L (ref 20–32)
Calcium: 9.2 mg/dL (ref 8.6–10.3)
Chloride: 102 mmol/L (ref 98–110)
Creat: 1.18 mg/dL (ref 0.70–1.28)
Globulin: 2.4 g/dL (ref 1.9–3.7)
Glucose, Bld: 86 mg/dL (ref 65–139)
Potassium: 4.7 mmol/L (ref 3.5–5.3)
Sodium: 137 mmol/L (ref 135–146)
Total Bilirubin: 0.3 mg/dL (ref 0.2–1.2)
Total Protein: 6.1 g/dL (ref 6.1–8.1)
eGFR: 64 mL/min/1.73m2 (ref >=60)

## 2024-05-24 LAB — VITAMIN B12: Vitamin B-12: 1173 pg/mL — ABNORMAL HIGH (ref 200–1100)

## 2024-05-24 NOTE — Telephone Encounter (Signed)
 Can we double book my 1pm so can do that real quick?

## 2024-05-24 NOTE — Telephone Encounter (Signed)
 Done

## 2024-05-28 ENCOUNTER — Other Ambulatory Visit

## 2024-05-30 ENCOUNTER — Ambulatory Visit
Admission: RE | Admit: 2024-05-30 | Discharge: 2024-05-30 | Disposition: A | Discharge status: Home / Self Care | Source: Ambulatory Visit | Attending: Nurse Practitioner | Admitting: Nurse Practitioner

## 2024-05-30 DIAGNOSIS — E041 Nontoxic single thyroid nodule: Secondary | ICD-10-CM

## 2024-06-02 ENCOUNTER — Other Ambulatory Visit: Payer: Self-pay | Admitting: Nurse Practitioner

## 2024-06-02 DIAGNOSIS — J309 Allergic rhinitis, unspecified: Secondary | ICD-10-CM

## 2024-06-11 ENCOUNTER — Ambulatory Visit (INDEPENDENT_AMBULATORY_CARE_PROVIDER_SITE_OTHER): Admitting: Nurse Practitioner

## 2024-06-11 ENCOUNTER — Encounter: Payer: Self-pay | Admitting: Nurse Practitioner

## 2024-06-11 VITALS — BP 124/78 | HR 85 | Temp 97.7°F | Ht 68.0 in

## 2024-06-11 DIAGNOSIS — Z952 Presence of prosthetic heart valve: Secondary | ICD-10-CM

## 2024-06-11 DIAGNOSIS — Z7901 Long term (current) use of anticoagulants: Secondary | ICD-10-CM

## 2024-06-11 LAB — POCT INR: POC INR: 3.9

## 2024-06-11 NOTE — Progress Notes (Signed)
 Careteam: Patient Care Team: Caro Harlene POUR, NP as PCP - General (Geriatric Medicine)  PLACE OF SERVICE:  [facility]  Advanced Directive information    Allergies  Allergen Reactions   Aspirin     Other reaction(s): Other (See Comments) Can't take due to taking blood thinners   Morphine And Codeine Other (See Comments)    Other reaction(s): Other (See Comments) Hallucinations     Nitroglycerin      Severe headache   Promethazine Other (See Comments)    Other reaction(s): Other (See Comments) Hallucinations    Promethazine Hcl Other (See Comments)    Chief Complaint  Patient presents with   Coagulation Disorder    INR check // no missed doses     HPI:  Mr currie here for INR check. Goal INR 3-3.5 On coumadin  3.5 mg daily except every 4th Wednesday takes 3.75 mg  No unusual bleeding or bruising noted  Took 3.75 last Wednesday and taking 3.5 daily No missed doss  Lab Results  Component Value Date   INR 3.9 06/11/2024   INR 4.0 (A) 05/23/2024   INR 3.6 04/23/2024     Review of Systems:  Review of Systems  Constitutional:  Negative for chills, fever and weight loss.  HENT:  Negative for tinnitus.   Respiratory:  Negative for cough, sputum production and shortness of breath.   Cardiovascular:  Negative for chest pain, palpitations and leg swelling.  Gastrointestinal:  Negative for abdominal pain, constipation, diarrhea and heartburn.  Genitourinary:  Negative for dysuria, frequency and urgency.  Musculoskeletal:  Negative for back pain, falls, joint pain and myalgias.  Skin: Negative.   Neurological:  Negative for dizziness and headaches.  Psychiatric/Behavioral:  Negative for depression and memory loss. The patient does not have insomnia.     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   Atypical mole 11/18/2021   Right Lower Back (moderate with halo nevus effect) (wider shave)   Closed compression  fracture of body of L1 vertebra (HCC) 03/28/2021   Complication of anesthesia    PONV   Compression fracture of T12 vertebra (HCC) 03/28/2021   Depression    History of bladder cancer    History of pneumonia 1959   History of prostate cancer    History of scarlet fever 1956   History of stroke 2009   Hypertension    Long term current use of anticoagulant 03/22/2011   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   Right Alar Crease   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   Right Temple (in situ) (curet and 5FU)   Past Surgical History:  Procedure Laterality Date   AORTIC VALVE REPLACEMENT     mechanical   BIOPSY  03/28/2021   Procedure: BIOPSY;  Surgeon: Donnald Charleston, MD;  Location: [facility];  Service: Endoscopy;;   BLADDER SURGERY  1983   Bladder Cancer Surgery    CATARACT EXTRACTION Right 06/30/2023   CATARACT EXTRACTION Left 06/16/2023   ESOPHAGOGASTRODUODENOSCOPY (EGD) WITH PROPOFOL  N/A 03/28/2021   Procedure: ESOPHAGOGASTRODUODENOSCOPY (EGD) WITH PROPOFOL ;  Surgeon: Donnald Charleston, MD;  Location: [facility];  Service: Endoscopy;  Laterality: N/A;   KNEE SURGERY Right 1980   KNEE SURGERY Left 1982   OTHER SURGICAL HISTORY  1992   stomach muscles removed due to Coumadin  caused bleeding.   SKIN LESION EXCISION     Some cancerous   SKIN SURGERY  01/30/2022  Basal Cell on side of nose   TONSILLECTOMY  1965   Social History:   reports that he has never smoked. His smokeless tobacco use includes snuff. He reports that he does not currently use alcohol . He reports that he does not use drugs.  Family History  Problem Relation Age of Onset   Heart disease Neg Hx     Medications: Patient's Medications  New Prescriptions   No medications on file  Previous Medications   ACETAMINOPHEN  (TYLENOL ) 500 MG TABLET    Take 500 mg by mouth daily as needed for moderate pain.   ALBUTEROL  (VENTOLIN  HFA) 108 (90 BASE) MCG/ACT INHALER    Inhale 1  puff into the lungs every 6 (six) hours as needed for wheezing or shortness of breath.   AMOXICILLIN  (AMOXIL ) 500 MG CAPSULE    Take 1,000 mg by mouth 2 (two) times daily. Prior to dental procedure   BENZONATATE  (TESSALON ) 100 MG CAPSULE    Take 1 capsule (100 mg total) by mouth 3 (three) times daily as needed for cough.   CETIRIZINE (ZYRTEC) 10 MG TABLET    Take 10 mg by mouth daily.   CHOLECALCIFEROL (VITAMIN D-3 PO)    Take 1 tablet by mouth daily. Dose unknown   DOCUSATE SODIUM (COLACE) 100 MG CAPSULE    Take 100 mg by mouth as needed for mild constipation.   DOXYCYCLINE  (VIBRA -TABS) 100 MG TABLET    Take 1 tablet (100 mg total) by mouth 2 (two) times daily.   FAMOTIDINE  (PEPCID ) 20 MG TABLET    Take 1 tablet (20 mg total) by mouth daily.   FEROSUL 325 (65 FE) MG TABLET    TAKE 1 TABLET BY MOUTH DAILY   FLUTICASONE  (FLONASE ) 50 MCG/ACT NASAL SPRAY    SPRAY 2 SPRAYS IN EACH NOSTRIL 2 TIMES A DAY   FUROSEMIDE  (LASIX ) 20 MG TABLET    Take 1 tablet (20 mg total) by mouth daily as needed for fluid or edema (3lb weight gain).   LOVASTATIN  (MEVACOR ) 40 MG TABLET    TAKE 1 TABLET BY MOUTH AT BEDTIME   PANTOPRAZOLE  (PROTONIX ) 40 MG TABLET    TAKE 1 TABLET BY MOUTH TWICE A DAY   PROPYLENE GLYCOL (SYSTANE BALANCE) 0.6 % SOLN    Apply 1 drop to eye as needed (for dry eye).   RAMIPRIL  (ALTACE ) 10 MG CAPSULE    TAKE 1 CAPSULE BY MOUTH 2 TIMES A DAY   TRAZODONE  (DESYREL ) 50 MG TABLET    TAKE 1/2 TABLET BY MOUTH AT BEDTIME   VITAMIN B-12 (CYANOCOBALAMIN) 1000 MCG TABLET    Take 1,000 mcg by mouth daily.   WARFARIN (COUMADIN ) 1 MG TABLET    TAKE 1/2 TABLET BY MOUTH DAILY WITH 3MG  TO TOTAL 3.5 MG EXCEPT EVERY 4th WEDNESDAY take 3/4 tablet to total 3.75   WARFARIN (COUMADIN ) 3 MG TABLET    TAKE 1 TABLET BY MOUTH DAILY ALONG WITH 0.5 MILLIGRAM TABLET FOR A TOTAL DOSE OF 3.5 MILIGRAMS AS DIRECTED BY DOCTOR  Modified Medications   No medications on file  Discontinued Medications   No medications on file     Physical Exam:  Vitals:   06/11/24 1315  BP: 124/78  Pulse: 85  Temp: 97.7 F (36.5 C)  SpO2: 96%  Height: 5' 8 (1.727 m)   Body mass index is 28.01 kg/m. Wt Readings from Last 3 Encounters:  05/23/24 184 lb 3.2 oz (83.6 kg)  04/23/24 183 lb 9.6 oz (83.3 kg)  03/21/24  182 lb 3.2 oz (82.6 kg)    Physical Exam Constitutional:      General: He is not in acute distress.    Appearance: He is well-developed. He is not diaphoretic.  HENT:     Head: Normocephalic and atraumatic.     Right Ear: External ear normal.     Left Ear: External ear normal.     Mouth/Throat:     Pharynx: No oropharyngeal exudate.  Eyes:     Conjunctiva/sclera: Conjunctivae normal.     Pupils: Pupils are equal, round, and reactive to light.  Cardiovascular:     Rate and Rhythm: Normal rate and regular rhythm.     Heart sounds: Murmur heard.  Pulmonary:     Effort: Pulmonary effort is normal.     Breath sounds: Normal breath sounds.  Abdominal:     General: Bowel sounds are normal.     Palpations: Abdomen is soft.  Musculoskeletal:        General: No tenderness.     Cervical back: Normal range of motion and neck supple.     Right lower leg: No edema.     Left lower leg: No edema.  Skin:    General: Skin is warm and dry.  Neurological:     Mental Status: He is alert and oriented to person, place, and time.    Labs reviewed: Basic Metabolic Panel: Recent Labs    11/14/23 1005 05/23/24 1506  NA 140 137  K 4.9 4.7  CL 105 102  CO2 27 28  GLUCOSE 106* 86  BUN 14 16  CREATININE 1.23 1.18  CALCIUM 9.5 9.2   Liver Function Tests: Recent Labs    11/14/23 1005 05/23/24 1506  AST 16 16  ALT 13 12  BILITOT 0.4 0.3  PROT 5.8* 6.1   No results for input(s): LIPASE, AMYLASE in the last 8760 hours. No results for input(s): AMMONIA in the last 8760 hours. CBC: Recent Labs    08/03/23 1426 11/14/23 1005 05/23/24 1506  WBC 9.1 8.3 9.5  NEUTROABS 6,243 5,553 6,669  HGB  14.0 14.3 14.1  HCT 43.7 43.1 43.4  MCV 90.1 87.6 91.0  PLT 288 277 305   Lipid Panel: Recent Labs    11/14/23 1005  CHOL 174  HDL 47  LDLCALC 96  TRIG 212*  CHOLHDL 3.7   TSH: No results for input(s): TSH in the last 8760 hours. A1C: No results found for: HGBA1C   Assessment/Plan 1. H/O aortic valve replacement Stable, continues on coumadin  for anticoagulation  Goal 3-3.5  2. Anticoagulated (Primary) - POCT INR at 3.9, no abnormal bleeding and bruising.  Will continue current regimen and recheck INR in 4 weeks.    Shakeita Vandevander K. Caro BODILY [facility] [phone]

## 2024-06-22 ENCOUNTER — Ambulatory Visit: Admitting: Nurse Practitioner

## 2024-07-02 ENCOUNTER — Other Ambulatory Visit: Payer: Self-pay | Admitting: Nurse Practitioner

## 2024-07-02 DIAGNOSIS — K219 Gastro-esophageal reflux disease without esophagitis: Secondary | ICD-10-CM

## 2024-07-03 NOTE — Telephone Encounter (Signed)
 High risk warning populated when attempting to refill medication  Please review and approval if appropriate   Thanks,  [facility] W/CMA

## 2024-07-05 ENCOUNTER — Telehealth: Payer: Self-pay

## 2024-07-05 ENCOUNTER — Encounter: Payer: Self-pay | Admitting: Cardiology

## 2024-07-05 NOTE — Telephone Encounter (Signed)
 Message routed to PCP Caro, Harlene POUR, NP

## 2024-07-05 NOTE — Telephone Encounter (Signed)
 Copied from CRM [id number]. Topic: Clinical - Medical Advice >> Jul 05, 2024  4:17 PM Merlynn A wrote: Reason for CRM: Patient called in requesting to have COVID booster prescription sent to pharmacy. Please send prescription request to [facility] per patients request.

## 2024-07-06 MED ORDER — COVID-19 MRNA VAC-TRIS(PFIZER) 30 MCG/0.3ML IM SUSY
0.3000 mL | PREFILLED_SYRINGE | Freq: Once | INTRAMUSCULAR | 0 refills | Status: AC
Start: 2024-07-06 — End: 2024-07-06

## 2024-07-06 NOTE — Telephone Encounter (Signed)
 Noted

## 2024-07-06 NOTE — Telephone Encounter (Signed)
 Rx sent, does not need prescription for flu

## 2024-07-17 ENCOUNTER — Other Ambulatory Visit: Payer: Self-pay | Admitting: Nurse Practitioner

## 2024-07-17 DIAGNOSIS — I10 Essential (primary) hypertension: Secondary | ICD-10-CM

## 2024-07-18 ENCOUNTER — Other Ambulatory Visit: Payer: Self-pay | Admitting: Nurse Practitioner

## 2024-07-18 DIAGNOSIS — I10 Essential (primary) hypertension: Secondary | ICD-10-CM

## 2024-07-19 ENCOUNTER — Encounter: Payer: Self-pay | Admitting: Nurse Practitioner

## 2024-07-19 NOTE — Patient Instructions (Signed)
 1.) Visit your local pharmacy to receive your shingles vaccine   INR today is 3.9, you took your additional 3/4 tablet on the first Wednesday of the month. Lets change to every 5th Wednesday at this time since INR is on the high side.  So next 3.75 dosing is on October 8th

## 2024-07-20 ENCOUNTER — Encounter: Payer: Self-pay | Admitting: Nurse Practitioner

## 2024-07-20 ENCOUNTER — Ambulatory Visit (INDEPENDENT_AMBULATORY_CARE_PROVIDER_SITE_OTHER): Admitting: Nurse Practitioner

## 2024-07-20 VITALS — BP 120/70 | HR 77 | Temp 97.9°F | Ht 68.0 in | Wt 182.0 lb

## 2024-07-20 DIAGNOSIS — Z7901 Long term (current) use of anticoagulants: Secondary | ICD-10-CM

## 2024-07-20 DIAGNOSIS — Z952 Presence of prosthetic heart valve: Secondary | ICD-10-CM | POA: Diagnosis not present

## 2024-07-20 DIAGNOSIS — Z23 Encounter for immunization: Secondary | ICD-10-CM

## 2024-07-20 LAB — POCT INR: POC INR: 3.9

## 2024-07-20 NOTE — Progress Notes (Signed)
 Careteam: Patient Care Team: Garrett Harlene POUR, NP as PCP - General (Geriatric Medicine)  PLACE OF SERVICE:  [facility]  Advanced Directive information    Allergies  Allergen Reactions   Aspirin     Other reaction(s): Other (See Comments) Can't take due to taking blood thinners   Morphine And Codeine Other (See Comments)    Other reaction(s): Other (See Comments) Hallucinations     Nitroglycerin      Severe headache   Promethazine Other (See Comments)    Other reaction(s): Other (See Comments) Hallucinations    Promethazine Hcl Other (See Comments)    Chief Complaint  Patient presents with   Anticoagulation    PT/INR check, no missed doses, no major diet changes, and no unusual bleeding. 3.9 today.     HPI:  Discussed the use of AI scribe software for clinical note transcription with the patient, who gave verbal consent to proceed.  History of Present Illness Garrett Terry is a 78 year old male who presents for INR monitoring due to hx of aortic valve replacement  His INR is currently 3.9. He takes coumadin  3.5 mg daily except 3.75 mg every 4th Wednesday.  He took extra 0.25 on the first Wednesday of the month.  No unusual bleeding, bruising, or shortness of breath. No changes in medication   Review of Systems:  Review of Systems  Constitutional:  Negative for chills, fever and weight loss.  HENT:  Negative for tinnitus.   Respiratory:  Negative for cough, sputum production and shortness of breath.   Cardiovascular:  Negative for chest pain, palpitations and leg swelling.  Gastrointestinal:  Negative for abdominal pain, constipation, diarrhea and heartburn.  Genitourinary:  Negative for dysuria, frequency and urgency.  Musculoskeletal:  Negative for back pain, falls, joint pain and myalgias.  Skin: Negative.   Neurological:  Negative for dizziness and headaches.  Psychiatric/Behavioral:  Negative for depression and memory loss. The patient does not  have insomnia.     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   Right Alar Crease   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   Right Temple (in situ) (curet and 5FU)   Past Surgical History:  Procedure Laterality Date   AORTIC VALVE REPLACEMENT     mechanical   BIOPSY  03/28/2021   Procedure: BIOPSY;  Surgeon: Donnald Charleston, MD;  Location: [facility];  Service: Endoscopy;;   BLADDER SURGERY  1983   Bladder Cancer Surgery    CATARACT EXTRACTION Right 06/30/2023   CATARACT EXTRACTION Left 06/16/2023   ESOPHAGOGASTRODUODENOSCOPY (EGD) WITH PROPOFOL  N/A 03/28/2021   Procedure: ESOPHAGOGASTRODUODENOSCOPY (EGD) WITH PROPOFOL ;  Surgeon: Donnald Charleston, MD;  Location: [facility];  Service: Endoscopy;  Laterality: N/A;   KNEE SURGERY Right 1980   KNEE SURGERY Left 1982   OTHER  SURGICAL HISTORY  1992   stomach muscles removed due to Coumadin  caused bleeding.   SKIN LESION EXCISION     Some cancerous   SKIN SURGERY  01/30/2022   Basal Cell on side of nose   TONSILLECTOMY  1965   Social History:   reports that he has never smoked. His smokeless tobacco use includes snuff. He reports that he does not currently use alcohol . He reports that he does not use drugs.  Family History  Problem Relation Age of Onset   Heart disease Neg Hx     Medications: Patient's Medications  New  Prescriptions   No medications on file  Previous Medications   ACETAMINOPHEN  (TYLENOL ) 500 MG TABLET    Take 500 mg by mouth daily as needed for moderate pain.   ALBUTEROL  (VENTOLIN  HFA) 108 (90 BASE) MCG/ACT INHALER    Inhale 1 puff into the lungs every 6 (six) hours as needed for wheezing or shortness of breath.   AMOXICILLIN  (AMOXIL ) 500 MG CAPSULE    Take 1,000 mg by mouth 2 (two) times daily. Prior to dental procedure   BENZONATATE  (TESSALON ) 100 MG CAPSULE    Take 1 capsule (100 mg total) by mouth 3 (three) times daily as needed for cough.   CETIRIZINE (ZYRTEC) 10 MG TABLET    Take 10 mg by mouth daily.   CHOLECALCIFEROL (VITAMIN D-3 PO)    Take 1 tablet by mouth daily. Dose unknown   DOCUSATE SODIUM (COLACE) 100 MG CAPSULE    Take 100 mg by mouth as needed for mild constipation.   FAMOTIDINE  (PEPCID ) 20 MG TABLET    Take 1 tablet (20 mg total) by mouth daily.   FAMOTIDINE  (PEPCID ) 20 MG TABLET    Take 20 mg by mouth as needed for heartburn or indigestion.   FEROSUL 325 (65 FE) MG TABLET    TAKE 1 TABLET BY MOUTH DAILY   FLUTICASONE  (FLONASE ) 50 MCG/ACT NASAL SPRAY    SPRAY 2 SPRAYS IN EACH NOSTRIL 2 TIMES A DAY   FUROSEMIDE  (LASIX ) 20 MG TABLET    Take 1 tablet (20 mg total) by mouth daily as needed for fluid or edema (3lb weight gain).   LOVASTATIN  (MEVACOR ) 40 MG TABLET    TAKE 1 TABLET BY MOUTH AT BEDTIME   PANTOPRAZOLE  (PROTONIX ) 40 MG TABLET    TAKE 1 TABLET BY MOUTH 2 TIMES A DAY   PROPYLENE GLYCOL (SYSTANE BALANCE) 0.6 % SOLN    Apply 1 drop to eye as needed (for dry eye).   RAMIPRIL  (ALTACE ) 10 MG CAPSULE    TAKE 1 CAPSULE BY MOUTH 2 TIMES A DAY   TRAZODONE  (DESYREL ) 50 MG TABLET    TAKE 1/2 TABLET BY MOUTH AT BEDTIME   VITAMIN B-12 (CYANOCOBALAMIN) 1000 MCG TABLET    Take 1,000 mcg by mouth daily.   WARFARIN (COUMADIN ) 1 MG TABLET    TAKE 1/2 TABLET BY MOUTH DAILY WITH 3MG  TO TOTAL 3.5 MG EXCEPT EVERY 4th WEDNESDAY take 3/4 tablet to total 3.75   WARFARIN (COUMADIN ) 3 MG TABLET     TAKE 1 TABLET BY MOUTH DAILY ALONG WITH 0.5 MILLIGRAM TABLET FOR A TOTAL DOSE OF 3.5 MILIGRAMS AS DIRECTED BY DOCTOR  Modified Medications   No medications on file  Discontinued Medications   FAMOTIDINE  (PEPCID ) 40 MG TABLET    Take 40 mg by mouth as needed for heartburn or indigestion.    Physical Exam:  Vitals:   07/20/24 1127  BP: 120/70  Pulse:  77  Temp: 97.9 F (36.6 C)  SpO2: 98%  Weight: 182 lb (82.6 kg)  Height: 5' 8 (1.727 m)   Body mass index is 27.67 kg/m. Wt Readings from Last 3 Encounters:  07/20/24 182 lb (82.6 kg)  05/23/24 184 lb 3.2 oz (83.6 kg)  04/23/24 183 lb 9.6 oz (83.3 kg)    Physical Exam Constitutional:      General: He is not in acute distress.    Appearance: He is well-developed. He is not diaphoretic.  HENT:     Head: Normocephalic and atraumatic.     Right Ear: External ear normal.     Left Ear: External ear normal.     Mouth/Throat:     Pharynx: No oropharyngeal exudate.  Eyes:     Conjunctiva/sclera: Conjunctivae normal.     Pupils: Pupils are equal, round, and reactive to light.  Cardiovascular:     Rate and Rhythm: Normal rate and regular rhythm.     Heart sounds: Murmur heard.  Pulmonary:     Effort: Pulmonary effort is normal.     Breath sounds: Normal breath sounds.  Abdominal:     General: Bowel sounds are normal.     Palpations: Abdomen is soft.  Musculoskeletal:        General: No tenderness.     Cervical back: Normal range of motion and neck supple.     Right lower leg: No edema.     Left lower leg: No edema.  Skin:    General: Skin is warm and dry.  Neurological:     Mental Status: He is alert and oriented to person, place, and time.     Labs reviewed: Basic Metabolic Panel: Recent Labs    11/14/23 1005 05/23/24 1506  NA 140 137  K 4.9 4.7  CL 105 102  CO2 27 28  GLUCOSE 106* 86  BUN 14 16  CREATININE 1.23 1.18  CALCIUM 9.5 9.2   Liver Function Tests: Recent Labs    11/14/23 1005 05/23/24 1506   AST 16 16  ALT 13 12  BILITOT 0.4 0.3  PROT 5.8* 6.1   No results for input(s): LIPASE, AMYLASE in the last 8760 hours. No results for input(s): AMMONIA in the last 8760 hours. CBC: Recent Labs    08/03/23 1426 11/14/23 1005 05/23/24 1506  WBC 9.1 8.3 9.5  NEUTROABS 6,243 5,553 6,669  HGB 14.0 14.3 14.1  HCT 43.7 43.1 43.4  MCV 90.1 87.6 91.0  PLT 288 277 305   Lipid Panel: Recent Labs    11/14/23 1005  CHOL 174  HDL 47  LDLCALC 96  TRIG 212*  CHOLHDL 3.7   TSH: No results for input(s): TSH in the last 8760 hours. A1C: No results found for: HGBA1C   Assessment/Plan  Long-term (current) use of anticoagulants, INR goal 3-3.5 -  INR slightly elevated at 3.9, above target range. No adverse effects reported. - Adjust dosing: take additional 3/4 tablet every fifth Wednesday. - Follow-up on October 17th at 8:00 AM to reassess INR.POCT INR  H/O aortic valve replacement Symptoms stable, will continue montioring  Follow up INR in 4 weeks.  Garrett Terry [facility] [phone]

## 2024-08-02 DIAGNOSIS — I6529 Occlusion and stenosis of unspecified carotid artery: Secondary | ICD-10-CM | POA: Insufficient documentation

## 2024-08-02 NOTE — Progress Notes (Unsigned)
  Cardiology Office Note:   Date:  08/03/2024  ID:  Ido, Wollman 06-03-1946, MRN [id number] PCP: Caro Harlene POUR, NP  [facility] Providers Cardiologist:  Lynwood Schilling, MD {  History of Present Illness:   Garrett Terry is a 78 y.o. male who presents for follow up of AVR.    He had a history of mechanical aortic valve with a Laree Andes implanted at [facility] in January 1985.  He has been on anticoagulation since that time.    He returns for follow-up. Since was last seen he has done well.  The patient denies any new symptoms such as chest discomfort, neck or arm discomfort. There has been no new shortness of breath, PND or orthopnea. There have been no reported palpitations, presyncope or syncope.     ROS: As stated in the HPI and negative for all other systems.  Studies Reviewed:    EKG:   EKG Interpretation Date/Time:  Friday August 03 2024 14:36:43 EDT Ventricular Rate:  67 PR Interval:  202 QRS Duration:  128 QT Interval:  408 QTC Calculation: 431 R Axis:   -25  Text Interpretation: Sinus rhythm with marked sinus arrhythmia with occasional Premature ventricular complexes Non-specific intra-ventricular conduction block When compared with ECG of 20-Jul-2023 13:38, No significant change since last tracing Confirmed by Schilling Lynwood ([id number]) on 08/03/2024 2:55:01 PM   Risk Assessment/Calculations:              Physical Exam:   VS:  BP 120/84   Pulse 67   Ht 5' 8 (1.727 m)   Wt 184 lb (83.5 kg)   SpO2 99%   BMI 27.98 kg/m    Wt Readings from Last 3 Encounters:  08/03/24 184 lb (83.5 kg)  07/20/24 182 lb (82.6 kg)  05/23/24 184 lb 3.2 oz (83.6 kg)     GEN: Well nourished, well developed in no acute distress NECK: No JVD; No carotid bruits CARDIAC: RRR, 2/6 brief systolic murmur, no diastolic murmurs, rubs, gallops RESPIRATORY:  Clear to auscultation without rales, wheezing or rhonchi  ABDOMEN: Soft, non-tender,  non-distended EXTREMITIES:  No edema; No deformity   ASSESSMENT AND PLAN:   AVR:    He had stable aortic Shiley valve replacement in 2023 with some very trivial perivalvular regurgitation.  I do not hear any new murmurs and there are no suggestions of any issues.  He tolerates anticoagulation has not followed closely.  He had a previous CVA when he came off of blood thinners and so would need bridging anticoagulation for he ever to stop his Coumadin  and we have discussed this.  He understands endocarditis prophylaxis.  No change in therapy.  CKD: His last creatinine was 1.18.  This is followed by his primary.  No change in therapy.  0.97.  No change in therapy.     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  He needs follow-up carotid Dopplers.   Follow up with me in 1 year.  Signed, Lynwood Schilling, MD

## 2024-08-03 ENCOUNTER — Encounter: Payer: Self-pay | Admitting: Cardiology

## 2024-08-03 ENCOUNTER — Ambulatory Visit: Attending: Cardiology | Admitting: Cardiology

## 2024-08-03 VITALS — BP 120/84 | HR 67 | Ht 68.0 in | Wt 184.0 lb

## 2024-08-03 DIAGNOSIS — Z952 Presence of prosthetic heart valve: Secondary | ICD-10-CM | POA: Insufficient documentation

## 2024-08-03 DIAGNOSIS — I6529 Occlusion and stenosis of unspecified carotid artery: Secondary | ICD-10-CM | POA: Diagnosis not present

## 2024-08-03 DIAGNOSIS — N182 Chronic kidney disease, stage 2 (mild): Secondary | ICD-10-CM | POA: Insufficient documentation

## 2024-08-03 NOTE — Patient Instructions (Addendum)
 Medication Instructions:  Your physician recommends that you continue on your current medications as directed. Please refer to the Current Medication list given to you today.  *If you need a refill on your cardiac medications before your next appointment, please call your pharmacy*  Lab Work: NONE If you have labs (blood work) drawn today and your tests are completely normal, you will receive your results only by: MyChart Message (if you have MyChart) OR A paper copy in the mail If you have any lab test that is abnormal or we need to change your treatment, we will call you to review the results.  Testing/Procedures: Carotid Dopplers  Follow-Up: At [facility], you and your health needs are our priority.  As part of our continuing mission to provide you with exceptional heart care, our providers are all part of one team.  This team includes your primary Cardiologist (physician) and Advanced Practice Providers or APPs (Physician Assistants and Nurse Practitioners) who all work together to provide you with the care you need, when you need it.  Your next appointment:   1 year(s)  Provider:   L[place], MD  We recommend signing up for the patient portal called MyChart.  Sign up information is provided on this After Visit Summary.  MyChart is used to connect with patients for Virtual Visits (Telemedicine).  Patients are able to view lab/test results, encounter notes, upcoming appointments, etc.  Non-urgent messages can be sent to your provider as well.   To learn more about what you can do with MyChart, go to [email or web address].

## 2024-08-13 ENCOUNTER — Ambulatory Visit (HOSPITAL_COMMUNITY)
Admission: RE | Admit: 2024-08-13 | Discharge: 2024-08-13 | Disposition: A | Discharge status: Home / Self Care | Source: Ambulatory Visit | Attending: Cardiology | Admitting: Cardiology

## 2024-08-13 DIAGNOSIS — I6529 Occlusion and stenosis of unspecified carotid artery: Secondary | ICD-10-CM | POA: Diagnosis present

## 2024-08-13 DIAGNOSIS — I6521 Occlusion and stenosis of right carotid artery: Secondary | ICD-10-CM | POA: Insufficient documentation

## 2024-08-14 ENCOUNTER — Ambulatory Visit: Payer: Self-pay | Admitting: Cardiology

## 2024-08-14 DIAGNOSIS — I6529 Occlusion and stenosis of unspecified carotid artery: Secondary | ICD-10-CM

## 2024-08-17 ENCOUNTER — Ambulatory Visit: Admitting: Nurse Practitioner

## 2024-08-17 ENCOUNTER — Encounter: Payer: Self-pay | Admitting: Nurse Practitioner

## 2024-08-17 VITALS — BP 130/72 | HR 63 | Temp 98.0°F | Ht 68.0 in | Wt 183.8 lb

## 2024-08-17 DIAGNOSIS — D509 Iron deficiency anemia, unspecified: Secondary | ICD-10-CM

## 2024-08-17 DIAGNOSIS — K219 Gastro-esophageal reflux disease without esophagitis: Secondary | ICD-10-CM

## 2024-08-17 DIAGNOSIS — J309 Allergic rhinitis, unspecified: Secondary | ICD-10-CM | POA: Diagnosis not present

## 2024-08-17 DIAGNOSIS — Z952 Presence of prosthetic heart valve: Secondary | ICD-10-CM | POA: Diagnosis not present

## 2024-08-17 DIAGNOSIS — Z7901 Long term (current) use of anticoagulants: Secondary | ICD-10-CM | POA: Diagnosis not present

## 2024-08-17 DIAGNOSIS — F5101 Primary insomnia: Secondary | ICD-10-CM | POA: Insufficient documentation

## 2024-08-17 LAB — POCT INR: POC INR: 4.4

## 2024-08-17 NOTE — Progress Notes (Signed)
 Careteam: Patient Care Team: Caro Harlene POUR, NP as PCP - General (Geriatric Medicine) Lavona Agent, MD as PCP - Cardiology (Cardiology)  PLACE OF SERVICE:  [facility]  Advanced Directive information    Allergies  Allergen Reactions   Aspirin     Other reaction(s): Other (See Comments) Can't take due to taking blood thinners   Morphine And Codeine Other (See Comments)    Other reaction(s): Other (See Comments) Hallucinations     Nitroglycerin      Severe headache   Promethazine Other (See Comments)    Other reaction(s): Other (See Comments) Hallucinations    Promethazine Hcl Other (See Comments)    Chief Complaint  Patient presents with   Anticoagulation   Medical Management of Chronic Issues    INR CHECK.  Pt declined Shingles vaccine but has gotten the COVID and FLU vaccine.  No missed doses    HPI:  Here for INR check.  Currently on coumadin  3.5 daily except every 4th Wednesday takes 3.75 mg  Goal 3-3.5 Just took 3.75 mg coumadin  2 days ago.  Lab Results  Component Value Date   INR 4.4 08/17/2024   INR 3.9 07/20/2024   INR 3.9 06/11/2024   No abnormal bruising or bleeding  Saw cardiologist last week for his routine follow up.  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 LDL 96 in January  Sleep doing well on trazodone .   Protonix  BID for GERD  Continues to sneeze with runny nose in the morning but improves throughout the day    Review of Systems:  Review of Systems  Constitutional:  Negative for chills, fever and weight loss.  HENT:  Negative for tinnitus.   Respiratory:  Negative for cough, sputum production and shortness of breath.   Cardiovascular:  Negative for chest pain, palpitations and leg swelling.  Gastrointestinal:  Negative for abdominal pain, constipation, diarrhea and heartburn.  Genitourinary:  Negative for dysuria, frequency and urgency.  Musculoskeletal:   Negative for back pain, falls, joint pain and myalgias.  Skin: Negative.   Neurological:  Negative for dizziness and headaches.  Psychiatric/Behavioral:  Negative for depression and memory loss. The patient does not have insomnia.     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   Right Alar Crease   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   Right Temple (in situ) (curet and 5FU)   Past Surgical History:  Procedure Laterality Date   AORTIC VALVE REPLACEMENT     mechanical   BIOPSY  03/28/2021   Procedure: BIOPSY;  Surgeon: Donnald Charleston, MD;  Location: [facility];  Service: Endoscopy;;   BLADDER SURGERY  1983   Bladder Cancer Surgery    CATARACT EXTRACTION Right 06/30/2023   CATARACT EXTRACTION Left 06/16/2023  ESOPHAGOGASTRODUODENOSCOPY (EGD) WITH PROPOFOL  N/A 03/28/2021   Procedure: ESOPHAGOGASTRODUODENOSCOPY (EGD) WITH PROPOFOL ;  Surgeon: Donnald Charleston, MD;  Location: [facility];  Service: Endoscopy;  Laterality: N/A;   KNEE SURGERY Right 1980   KNEE SURGERY Left 1982   OTHER SURGICAL HISTORY  1992   stomach muscles removed due to Coumadin  caused bleeding.   SKIN LESION EXCISION     Some cancerous   SKIN SURGERY  01/30/2022   Basal Cell on side of nose   TONSILLECTOMY  1965   Social History:   reports that he has never smoked. His smokeless tobacco use includes snuff.  He reports that he does not currently use alcohol . He reports that he does not use drugs.  Family History  Problem Relation Age of Onset   Heart disease Neg Hx     Medications: Patient's Medications  New Prescriptions   No medications on file  Previous Medications   ACETAMINOPHEN  (TYLENOL ) 500 MG TABLET    Take 500 mg by mouth daily as needed for moderate pain.   ALBUTEROL  (VENTOLIN  HFA) 108 (90 BASE) MCG/ACT INHALER    Inhale 1 puff into the lungs every 6 (six) hours as needed for wheezing or shortness of breath.   AMOXICILLIN  (AMOXIL ) 500 MG CAPSULE    Take 1,000 mg by mouth 2 (two) times daily. Prior to dental procedure   BENZONATATE  (TESSALON ) 100 MG CAPSULE    Take 1 capsule (100 mg total) by mouth 3 (three) times daily as needed for cough.   CETIRIZINE (ZYRTEC) 10 MG TABLET    Take 10 mg by mouth daily.   CHOLECALCIFEROL (VITAMIN D-3 PO)    Take 1 tablet by mouth daily. Dose unknown   DOCUSATE SODIUM (COLACE) 100 MG CAPSULE    Take 100 mg by mouth as needed for mild constipation.   FAMOTIDINE  (PEPCID ) 20 MG TABLET    Take 1 tablet (20 mg total) by mouth daily.   FAMOTIDINE  (PEPCID ) 20 MG TABLET    Take 20 mg by mouth as needed for heartburn or indigestion.   FEROSUL 325 (65 FE) MG TABLET    TAKE 1 TABLET BY MOUTH DAILY   FLUTICASONE  (FLONASE ) 50 MCG/ACT NASAL SPRAY    SPRAY 2 SPRAYS IN EACH NOSTRIL 2 TIMES A DAY   FUROSEMIDE  (LASIX ) 20 MG TABLET    Take 1 tablet (20 mg total) by mouth daily as needed for fluid or edema (3lb weight gain).   LOVASTATIN  (MEVACOR ) 40 MG TABLET    TAKE 1 TABLET BY MOUTH AT BEDTIME   PANTOPRAZOLE  (PROTONIX ) 40 MG TABLET    TAKE 1 TABLET BY MOUTH 2 TIMES A DAY   PROPYLENE GLYCOL (SYSTANE BALANCE) 0.6 % SOLN    Apply 1 drop to eye as needed (for dry eye).   RAMIPRIL  (ALTACE ) 10 MG CAPSULE    TAKE 1 CAPSULE BY MOUTH 2 TIMES A DAY   TRAZODONE  (DESYREL ) 50 MG TABLET    TAKE 1/2 TABLET BY MOUTH AT BEDTIME   VITAMIN B-12 (CYANOCOBALAMIN) 1000 MCG TABLET    Take  1,000 mcg by mouth daily.   WARFARIN (COUMADIN ) 1 MG TABLET    TAKE 1/2 TABLET BY MOUTH DAILY WITH 3MG  TO TOTAL 3.5 MG EXCEPT EVERY 4th WEDNESDAY take 3/4 tablet to total 3.75   WARFARIN (COUMADIN ) 3 MG TABLET    TAKE 1 TABLET BY MOUTH DAILY ALONG WITH 0.5 MILLIGRAM TABLET FOR A TOTAL DOSE OF 3.5 MILIGRAMS AS DIRECTED BY DOCTOR  Modified Medications   No  medications on file  Discontinued Medications   No medications on file    Physical Exam:  Vitals:   08/17/24 0809  BP: 130/72  Pulse: 63  Temp: 98 F (36.7 C)  SpO2: 99%  Weight: 183 lb 12.8 oz (83.4 kg)  Height: 5' 8 (1.727 m)   Body mass index is 27.95 kg/m. Wt Readings from Last 3 Encounters:  08/17/24 183 lb 12.8 oz (83.4 kg)  08/03/24 184 lb (83.5 kg)  07/20/24 182 lb (82.6 kg)    Physical Exam Constitutional:      General: He is not in acute distress.    Appearance: He is well-developed. He is not diaphoretic.  HENT:     Head: Normocephalic and atraumatic.     Right Ear: External ear normal.     Left Ear: External ear normal.     Mouth/Throat:     Pharynx: No oropharyngeal exudate.  Eyes:     Conjunctiva/sclera: Conjunctivae normal.     Pupils: Pupils are equal, round, and reactive to light.  Cardiovascular:     Rate and Rhythm: Normal rate and regular rhythm.     Heart sounds: Murmur heard.  Pulmonary:     Effort: Pulmonary effort is normal.     Breath sounds: Normal breath sounds.  Abdominal:     General: Bowel sounds are normal.     Palpations: Abdomen is soft.  Musculoskeletal:        General: No tenderness.     Cervical back: Normal range of motion and neck supple.     Right lower leg: No edema.     Left lower leg: No edema.  Skin:    General: Skin is warm and dry.  Neurological:     Mental Status: He is alert and oriented to person, place, and time.    Labs reviewed: Basic Metabolic Panel: Recent Labs    11/14/23 1005 05/23/24 1506  NA 140 137  K 4.9 4.7  CL 105 102  CO2 27 28   GLUCOSE 106* 86  BUN 14 16  CREATININE 1.23 1.18  CALCIUM 9.5 9.2   Livr Function Tests: Recent Labs    11/14/23 1005 05/23/24 1506  AST 16 16  ALT 13 12  BILITOT 0.4 0.3  PROT 5.8* 6.1   No results for input(s): LIPASE, AMYLASE in the last 8760 hours. No results for input(s): AMMONIA in the last 8760 hours. CBC: Recent Labs    11/14/23 1005 05/23/24 1506  WBC 8.3 9.5  NEUTROABS 5,553 6,669  HGB 14.3 14.1  HCT 43.1 43.4  MCV 87.6 91.0  PLT 277 305   Lipid Panel: Recent Labs    11/14/23 1005  CHOL 174  HDL 47  LDLCALC 96  TRIG 212*  CHOLHDL 3.7   TSH: No results for input(s): TSH in the last 8760 hours. A1C: No results found for: HGBA1C   Assessment/Plan Anticoagulated Assessment & Plan: Due to aortic valve replacement  INR goal 3-3.5 INR elevated at 4.4 today, he recently took increase dose of coumdain 3.75 mg which he takes every 4 weeks, will have him take coumadin  3.5 mg daily and recheck in 4 weeks- very sensitive to changes, has had TIA like symptoms if INR goes <2.5    Orders: -     POCT INR  H/O aortic valve replacement Assessment & Plan: Without symptoms of shortness of breath, chest pains, LE edema. Continue on coumadin  at this time Goal 3-3.5    Gastroesophageal reflux disease without esophagitis  Assessment & Plan: Controlled on pepcid  BID   Allergic rhinitis, unspecified seasonality, unspecified trigger Assessment & Plan: Stable at this time.    Iron  deficiency anemia, unspecified iron  deficiency anemia type Assessment & Plan: Continues on supplement   Primary insomnia Assessment & Plan: Well controlled on trazodone       INR in 4 weeks.   Shannan Garfinkel K. Caro BODILY [facility] [phone]

## 2024-08-17 NOTE — Assessment & Plan Note (Signed)
 Without symptoms of shortness of breath, chest pains, LE edema. Continue on coumadin  at this time Goal 3-3.5

## 2024-08-17 NOTE — Patient Instructions (Addendum)
 DECREASE 3.75 mg Wednesday coumdin to every 5 weeks.   We will schedule you for NOV 12th at 1:40 for INR check.

## 2024-08-17 NOTE — Assessment & Plan Note (Signed)
Continues on supplement 

## 2024-08-17 NOTE — Assessment & Plan Note (Signed)
 Stable at this time

## 2024-08-17 NOTE — Assessment & Plan Note (Signed)
 Due to aortic valve replacement  INR goal 3-3.5 INR elevated at 4.4 today, he recently took increase dose of coumdain 3.75 mg which he takes every 4 weeks, will have him take coumadin  3.5 mg daily and recheck in 4 weeks- very sensitive to changes, has had TIA like symptoms if INR goes <2.5

## 2024-08-17 NOTE — Assessment & Plan Note (Signed)
 Controlled on pepcid  BID

## 2024-08-17 NOTE — Assessment & Plan Note (Signed)
 Well controlled on trazodone.

## 2024-09-12 ENCOUNTER — Ambulatory Visit (INDEPENDENT_AMBULATORY_CARE_PROVIDER_SITE_OTHER): Payer: Self-pay | Admitting: Nurse Practitioner

## 2024-09-12 ENCOUNTER — Ambulatory Visit: Admitting: Nurse Practitioner

## 2024-09-12 ENCOUNTER — Encounter: Payer: Self-pay | Admitting: Nurse Practitioner

## 2024-09-12 VITALS — BP 118/70 | HR 63 | Resp 16 | Ht 68.0 in | Wt 181.8 lb

## 2024-09-12 DIAGNOSIS — Z952 Presence of prosthetic heart valve: Secondary | ICD-10-CM

## 2024-09-12 DIAGNOSIS — Z7901 Long term (current) use of anticoagulants: Secondary | ICD-10-CM

## 2024-09-12 LAB — POCT INR: POC INR: 3.7

## 2024-09-12 NOTE — Progress Notes (Signed)
 Careteam: Patient Care Team: Caro Harlene POUR, NP as PCP - General (Geriatric Medicine) Lavona Agent, MD as PCP - Cardiology (Cardiology)  PLACE OF SERVICE:  [facility]  Advanced Directive information    Allergies  Allergen Reactions   Aspirin     Other reaction(s): Other (See Comments) Can't take due to taking blood thinners   Morphine And Codeine Other (See Comments)    Other reaction(s): Other (See Comments) Hallucinations     Nitroglycerin      Severe headache   Promethazine Other (See Comments)    Other reaction(s): Other (See Comments) Hallucinations    Promethazine Hcl Other (See Comments)    Chief Complaint  Patient presents with   Anticoagulation    No missed doses / no clotting.       HPI:  Here for INR check.  Discussed the use of AI scribe software for clinical note transcription with the patient, who gave verbal consent to proceed.  History of Present Illness Garrett Terry is a 78 year old male who presents for an INR check.  His last INR check a month ago showed a level of 4.4. He is currently on Coumadin , taking 3.5 mg daily except every fourth Wednesday when he takes 3.75 mg. Today is the day he is supposed to take the 3.75 mg dose, but he has not taken it yet. He takes 3.75 mg of Coumadin  every fourth week and 3.5 mg on all other days.  INR today is 3.7 no abnormal brusing or bleeding. No shortness of breath, LE edema, chest pains.     Review of Systems:  Review of Systems  Constitutional:  Negative for chills, fever and weight loss.  HENT:  Negative for tinnitus.   Respiratory:  Negative for cough, sputum production and shortness of breath.   Cardiovascular:  Negative for chest pain, palpitations and leg swelling.  Gastrointestinal:  Negative for abdominal pain, constipation, diarrhea and heartburn.  Genitourinary:  Negative for dysuria, frequency and urgency.  Musculoskeletal:  Negative for back pain, falls, joint pain  and myalgias.  Skin: Negative.   Neurological:  Negative for dizziness and headaches.  Psychiatric/Behavioral:  Negative for depression and memory loss. The patient does not have insomnia.     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   Right Alar Crease   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   Right Temple (in situ) (curet and 5FU)   Past Surgical History:  Procedure Laterality Date   AORTIC VALVE REPLACEMENT     mechanical   BIOPSY  03/28/2021   Procedure: BIOPSY;  Surgeon: Donnald Charleston, MD;  Location: [facility];  Service: Endoscopy;;   BLADDER SURGERY  1983   Bladder Cancer Surgery    CATARACT EXTRACTION Right 06/30/2023   CATARACT EXTRACTION Left 06/16/2023  ESOPHAGOGASTRODUODENOSCOPY (EGD) WITH PROPOFOL  N/A 03/28/2021   Procedure: ESOPHAGOGASTRODUODENOSCOPY (EGD) WITH PROPOFOL ;  Surgeon: Donnald Charleston, MD;  Location: [facility];  Service: Endoscopy;  Laterality: N/A;   KNEE SURGERY Right 1980   KNEE SURGERY Left 1982   OTHER SURGICAL HISTORY  1992   stomach muscles removed due to Coumadin  caused bleeding.   SKIN LESION EXCISION     Some cancerous   SKIN SURGERY  01/30/2022   Basal Cell on side of nose   TONSILLECTOMY  1965   Social History:   reports that he has never smoked. His smokeless tobacco use includes snuff. He reports that he does not currently use  alcohol . He reports that he does not use drugs.  Family History  Problem Relation Age of Onset   Heart disease Neg Hx     Medications: Patient's Medications  New Prescriptions   No medications on file  Previous Medications   ACETAMINOPHEN  (TYLENOL ) 500 MG TABLET    Take 500 mg by mouth daily as needed for moderate pain.   ALBUTEROL  (VENTOLIN  HFA) 108 (90 BASE) MCG/ACT INHALER    Inhale 1 puff into the lungs every 6 (six) hours as needed for wheezing or shortness of breath.   AMOXICILLIN  (AMOXIL ) 500 MG CAPSULE    Take 1,000 mg by mouth 2 (two) times daily. Prior to dental procedure   BENZONATATE  (TESSALON ) 100 MG CAPSULE    Take 1 capsule (100 mg total) by mouth 3 (three) times daily as needed for cough.   CETIRIZINE (ZYRTEC) 10 MG TABLET    Take 10 mg by mouth daily.   CHOLECALCIFEROL (VITAMIN D-3 PO)    Take 1 tablet by mouth daily. Dose unknown   DOCUSATE SODIUM (COLACE) 100 MG CAPSULE    Take 100 mg by mouth as needed for mild constipation.   FAMOTIDINE  (PEPCID ) 20 MG TABLET    Take 1 tablet (20 mg total) by mouth daily.   FEROSUL 325 (65 FE) MG TABLET    TAKE 1 TABLET BY MOUTH DAILY   FLUTICASONE  (FLONASE ) 50 MCG/ACT NASAL SPRAY    SPRAY 2 SPRAYS IN EACH NOSTRIL 2 TIMES A DAY   FUROSEMIDE  (LASIX ) 20 MG TABLET    Take 1 tablet (20 mg total) by mouth daily as needed for fluid or edema (3lb weight gain).   LOVASTATIN  (MEVACOR ) 40 MG TABLET    TAKE 1 TABLET BY MOUTH AT BEDTIME   PANTOPRAZOLE  (PROTONIX ) 40 MG TABLET    TAKE 1 TABLET BY MOUTH 2 TIMES A DAY   PROPYLENE GLYCOL (SYSTANE BALANCE) 0.6 % SOLN    Apply 1 drop to eye as needed (for dry eye).   RAMIPRIL  (ALTACE ) 10 MG CAPSULE    TAKE 1 CAPSULE BY MOUTH 2 TIMES A DAY   TRAZODONE  (DESYREL ) 50 MG TABLET    TAKE 1/2 TABLET BY MOUTH AT BEDTIME   VITAMIN B-12 (CYANOCOBALAMIN) 1000 MCG TABLET    Take 1,000 mcg by mouth daily.   WARFARIN (COUMADIN ) 1 MG TABLET    TAKE 1/2 TABLET BY MOUTH DAILY WITH 3MG  TO TOTAL 3.5 MG EXCEPT EVERY 4th  WEDNESDAY take 3/4 tablet to total 3.75   WARFARIN (COUMADIN ) 3 MG TABLET    TAKE 1 TABLET BY MOUTH DAILY ALONG WITH 0.5 MILLIGRAM TABLET FOR A TOTAL DOSE OF 3.5 MILIGRAMS AS DIRECTED BY DOCTOR  Modified Medications   No medications on file  Discontinued Medications   FAMOTIDINE  (PEPCID ) 20 MG TABLET    Take 20 mg by mouth  as needed for heartburn or indigestion.    Physical Exam:  Vitals:   09/12/24 1333  BP: 118/70  Pulse: 63  Resp: 16  SpO2: 98%  Weight: 181 lb 12.8 oz (82.5 kg)  Height: 5' 8 (1.727 m)   Body mass index is 27.64 kg/m. Wt Readings from Last 3 Encounters:  09/12/24 181 lb 12.8 oz (82.5 kg)  08/17/24 183 lb 12.8 oz (83.4 kg)  08/03/24 184 lb (83.5 kg)    Physical Exam Constitutional:      General: He is not in acute distress.    Appearance: He is well-developed. He is not diaphoretic.  HENT:     Head: Normocephalic and atraumatic.     Right Ear: External ear normal.     Left Ear: External ear normal.     Mouth/Throat:     Pharynx: No oropharyngeal exudate.  Eyes:     Conjunctiva/sclera: Conjunctivae normal.     Pupils: Pupils are equal, round, and reactive to light.  Cardiovascular:     Rate and Rhythm: Normal rate and regular rhythm.     Heart sounds: Normal heart sounds.  Pulmonary:     Effort: Pulmonary effort is normal.     Breath sounds: Normal breath sounds.  Abdominal:     General: Bowel sounds are normal.     Palpations: Abdomen is soft.  Musculoskeletal:        General: No tenderness.     Cervical back: Normal range of motion and neck supple.     Right lower leg: No edema.     Left lower leg: No edema.  Skin:    General: Skin is warm and dry.  Neurological:     Mental Status: He is alert and oriented to person, place, and time.     Labs reviewed: Basic Metabolic Panel: Recent Labs    11/14/23 1005 05/23/24 1506  NA 140 137  K 4.9 4.7  CL 105 102  CO2 27 28  GLUCOSE 106* 86  BUN 14 16  CREATININE 1.23 1.18  CALCIUM 9.5  9.2   Liver Function Tests: Recent Labs    11/14/23 1005 05/23/24 1506  AST 16 16  ALT 13 12  BILITOT 0.4 0.3  PROT 5.8* 6.1   No results for input(s): LIPASE, AMYLASE in the last 8760 hours. No results for input(s): AMMONIA in the last 8760 hours. CBC: Recent Labs    11/14/23 1005 05/23/24 1506  WBC 8.3 9.5  NEUTROABS 5,553 6,669  HGB 14.3 14.1  HCT 43.1 43.4  MCV 87.6 91.0  PLT 277 305   Lipid Panel: Recent Labs    11/14/23 1005  CHOL 174  HDL 47  LDLCALC 96  TRIG 212*  CHOLHDL 3.7   TSH: No results for input(s): TSH in the last 8760 hours. A1C: No results found for: HGBA1C   Assessment/Plan  There are no diagnoses linked to this encounter.  Assessment and Plan Assessment & Plan Anticoagulation management for prosthetic heart valve INR was 4.4 last month. Current regimen includes Coumadin  3.5 mg daily with an increased dose of 3.75 mg every 4th Wednesday. - INR slightly elevated at 3.7 today goal 3-3.5- will  - Continue Coumadin  3.5 mg daily except for the increased dose every FIFTH  Wednesday at this time - Follow up INR on December 19th.--this should be prior to next 3.75 mg dosing     Brigida Scotti K. Caro BODILY [facility] [phone]

## 2024-09-12 NOTE — Patient Instructions (Addendum)
 Go 5 weeks between 3.75 mg tablet.

## 2024-10-05 ENCOUNTER — Other Ambulatory Visit: Payer: Self-pay | Admitting: Nurse Practitioner

## 2024-10-05 DIAGNOSIS — Z952 Presence of prosthetic heart valve: Secondary | ICD-10-CM

## 2024-10-06 ENCOUNTER — Other Ambulatory Visit: Payer: Self-pay | Admitting: Nurse Practitioner

## 2024-10-19 ENCOUNTER — Ambulatory Visit (INDEPENDENT_AMBULATORY_CARE_PROVIDER_SITE_OTHER): Admitting: Nurse Practitioner

## 2024-10-19 ENCOUNTER — Encounter: Payer: Self-pay | Admitting: Nurse Practitioner

## 2024-10-19 VITALS — BP 124/80 | HR 77 | Temp 97.1°F | Ht 68.0 in | Wt 183.4 lb

## 2024-10-19 DIAGNOSIS — E782 Mixed hyperlipidemia: Secondary | ICD-10-CM | POA: Diagnosis not present

## 2024-10-19 DIAGNOSIS — I10 Essential (primary) hypertension: Secondary | ICD-10-CM | POA: Diagnosis not present

## 2024-10-19 DIAGNOSIS — Z7901 Long term (current) use of anticoagulants: Secondary | ICD-10-CM | POA: Diagnosis not present

## 2024-10-19 DIAGNOSIS — Z952 Presence of prosthetic heart valve: Secondary | ICD-10-CM | POA: Diagnosis not present

## 2024-10-19 DIAGNOSIS — D509 Iron deficiency anemia, unspecified: Secondary | ICD-10-CM

## 2024-10-19 LAB — POCT INR: POC INR: 4.6

## 2024-10-19 NOTE — Patient Instructions (Addendum)
 Take coumadin  1.5 mg daily for 3 days (half of a 3 mg tablet) then resume coumadin  3.5 mg daily   Come in on DEC 29th for lab ONLY to check INR

## 2024-10-19 NOTE — Progress Notes (Signed)
 "   Careteam: Patient Care Team: Caro Harlene POUR, NP as PCP - General (Geriatric Medicine) Lynwood Schilling, MD as PCP - Cardiology (Cardiology)  PLACE OF SERVICE:  [facility]  Advanced Directive information    Allergies[1]  Chief Complaint  Patient presents with   Anticoagulation    PT/INR Check. 2 weeks ago patient doubled dosed coumadin  by mistake and then skipped a dose, no bleeding and no diet change change     HPI:  Discussed the use of AI scribe software for clinical note transcription with the patient, who gave verbal consent to proceed.  Lab Results  Component Value Date   INR 4.6 10/19/2024   INR 3.7 09/12/2024   INR 4.4 08/17/2024     History of Present Illness Garrett Terry is a 78 year old male with aortic valve replacement who presents for INR follow-up.  He is on long-term anticoagulation therapy with Coumadin , GOAL 3-3.5,  currently taking coumadin  3.5 mg daily, with an additional 3.75 mg once every four weeks. Recently, his INR was elevated at 4.6. Two weeks ago, he accidentally doubled his dose of Coumadin  along with his other medications, which he realized the following evening. In response, he skipped his Coumadin  dose the next evening. He has a history of fluctuating INR levels, typically on the lower side, but this recent high reading is unusual for him.  No symptoms of spontaneous bruising, epistaxis, hematuria, or rectal bleeding. He has not experienced any symptoms suggestive of a TIA/stroke.  He has recently started water walking five days a week as a form of exercise. He cannot swim due to shoulder issues, but he has been consistent with water walking.  He received two carpal tunnel injections a couple of weeks ago, which he forgot to report earlier. His wrists are feeling better, although he still uses braces at night.   Review of Systems:  Review of Systems  Constitutional:  Negative for chills, fever and weight loss.  HENT:   Negative for tinnitus.   Respiratory:  Negative for cough, sputum production and shortness of breath.   Cardiovascular:  Negative for chest pain, palpitations and leg swelling.  Gastrointestinal:  Negative for abdominal pain, constipation, diarrhea and heartburn.  Genitourinary:  Negative for dysuria, frequency and urgency.  Musculoskeletal:  Negative for back pain, falls, joint pain and myalgias.  Skin: Negative.   Neurological:  Negative for dizziness and headaches.  Psychiatric/Behavioral:  Negative for depression and memory loss. The patient does not have insomnia.     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   Right Alar Crease   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   Right Temple (in situ) (curet and 5FU)   Past Surgical History:  Procedure Laterality Date   AORTIC VALVE REPLACEMENT     mechanical  BIOPSY  03/28/2021   Procedure: BIOPSY;  Surgeon: Donnald Charleston, MD;  Location: [facility];  Service: Endoscopy;;   BLADDER SURGERY  1983   Bladder Cancer Surgery    CATARACT EXTRACTION Right 06/30/2023   CATARACT EXTRACTION Left 06/16/2023   ESOPHAGOGASTRODUODENOSCOPY (EGD) WITH PROPOFOL  N/A 03/28/2021   Procedure: ESOPHAGOGASTRODUODENOSCOPY (EGD) WITH PROPOFOL ;  Surgeon: Donnald Charleston, MD;  Location: [facility];  Service: Endoscopy;  Laterality: N/A;   KNEE SURGERY  Right 1980   KNEE SURGERY Left 1982   OTHER SURGICAL HISTORY  1992   stomach muscles removed due to Coumadin  caused bleeding.   SKIN LESION EXCISION     Some cancerous   SKIN SURGERY  01/30/2022   Basal Cell on side of nose   TONSILLECTOMY  1965   Social History:   reports that he has never smoked. His smokeless tobacco use includes snuff. He reports that he does not currently use alcohol . He reports that he does not use drugs.  Family History  Problem Relation Age of Onset   Heart disease Neg Hx     Medications: Patient's Medications  New Prescriptions   No medications on file  Previous Medications   ACETAMINOPHEN  (TYLENOL ) 500 MG TABLET    Take 500 mg by mouth daily as needed for moderate pain.   ALBUTEROL  (VENTOLIN  HFA) 108 (90 BASE) MCG/ACT INHALER    Inhale 1 puff into the lungs every 6 (six) hours as needed for wheezing or shortness of breath.   AMOXICILLIN  (AMOXIL ) 500 MG CAPSULE    Take 1,000 mg by mouth 2 (two) times daily. Prior to dental procedure   BENZONATATE  (TESSALON ) 100 MG CAPSULE    Take 1 capsule (100 mg total) by mouth 3 (three) times daily as needed for cough.   CETIRIZINE (ZYRTEC) 10 MG TABLET    Take 10 mg by mouth daily.   CHOLECALCIFEROL (VITAMIN D-3 PO)    Take 1 tablet by mouth daily. Dose unknown   DOCUSATE SODIUM (COLACE) 100 MG CAPSULE    Take 100 mg by mouth as needed for mild constipation.   FAMOTIDINE  (PEPCID ) 20 MG TABLET    Take 20 mg by mouth as needed for heartburn or indigestion.   FEROSUL 325 (65 FE) MG TABLET    TAKE 1 TABLET BY MOUTH DAILY   FLUTICASONE  (FLONASE ) 50 MCG/ACT NASAL SPRAY    SPRAY 2 SPRAYS IN EACH NOSTRIL 2 TIMES A DAY   FUROSEMIDE  (LASIX ) 20 MG TABLET    Take 1 tablet (20 mg total) by mouth daily as needed for fluid or edema (3lb weight gain).   LOVASTATIN  (MEVACOR ) 40 MG TABLET    TAKE 1 TABLET BY MOUTH AT BEDTIME   PANTOPRAZOLE  (PROTONIX ) 40 MG TABLET    TAKE 1 TABLET BY MOUTH 2 TIMES A DAY   PROPYLENE GLYCOL (SYSTANE  BALANCE) 0.6 % SOLN    Apply 1 drop to eye as needed (for dry eye).   RAMIPRIL  (ALTACE ) 10 MG CAPSULE    TAKE 1 CAPSULE BY MOUTH 2 TIMES A DAY   TRAZODONE  (DESYREL ) 50 MG TABLET    TAKE A HALF TABLET BY MOUTH AT BEDTIME   VITAMIN B-12 (CYANOCOBALAMIN) 1000 MCG TABLET    Take 1,000 mcg by mouth daily.   WARFARIN (COUMADIN ) 1 MG TABLET    TAKE 1/2 TABLET BY MOUTH DAILY WITH 3MG  TO TOTAL 3.5 MG EXCEPT EVERY 4th WEDNESDAY take 3/4 tablet to total 3.75   WARFARIN (COUMADIN ) 3 MG TABLET    TAKE  1 TABLET BY MOUTH DAILY ALONG WITH 0.5 MILLIGRAM TABLET FOR TOTAL DOSE OF 3.5 MILLIGRAMS AS DIRECTED BY DOCTOR  Modified Medications   No medications on file  Discontinued Medications   FAMOTIDINE  (PEPCID ) 20 MG TABLET    Take 1 tablet (20 mg total) by mouth daily.    Physical Exam:  Vitals:   10/19/24 1140  BP: 124/80  Pulse: 77  Temp: (!) 97.1 F (36.2 C)  SpO2: 98%  Weight: 183 lb 6.4 oz (83.2 kg)  Height: 5' 8 (1.727 m)   Body mass index is 27.89 kg/m. Wt Readings from Last 3 Encounters:  10/19/24 183 lb 6.4 oz (83.2 kg)  09/12/24 181 lb 12.8 oz (82.5 kg)  08/17/24 183 lb 12.8 oz (83.4 kg)    Physical Exam Constitutional:      General: He is not in acute distress.    Appearance: He is well-developed. He is not diaphoretic.  HENT:     Head: Normocephalic and atraumatic.     Right Ear: External ear normal.     Left Ear: External ear normal.     Mouth/Throat:     Pharynx: No oropharyngeal exudate.  Eyes:     Conjunctiva/sclera: Conjunctivae normal.     Pupils: Pupils are equal, round, and reactive to light.  Cardiovascular:     Rate and Rhythm: Normal rate and regular rhythm.     Heart sounds: Murmur heard.  Pulmonary:     Effort: Pulmonary effort is normal.     Breath sounds: Normal breath sounds.  Abdominal:     General: Bowel sounds are normal.     Palpations: Abdomen is soft.  Musculoskeletal:        General: No tenderness.     Cervical back: Normal range of motion and  neck supple.     Right lower leg: No edema.     Left lower leg: No edema.  Skin:    General: Skin is warm and dry.  Neurological:     Mental Status: He is alert and oriented to person, place, and time.     Labs reviewed: Basic Metabolic Panel: Recent Labs    11/14/23 1005 05/23/24 1506  NA 140 137  K 4.9 4.7  CL 105 102  CO2 27 28  GLUCOSE 106* 86  BUN 14 16  CREATININE 1.23 1.18  CALCIUM 9.5 9.2   Liver Function Tests: Recent Labs    11/14/23 1005 05/23/24 1506  AST 16 16  ALT 13 12  BILITOT 0.4 0.3  PROT 5.8* 6.1   No results for input(s): LIPASE, AMYLASE in the last 8760 hours. No results for input(s): AMMONIA in the last 8760 hours. CBC: Recent Labs    11/14/23 1005 05/23/24 1506  WBC 8.3 9.5  NEUTROABS 5,553 6,669  HGB 14.3 14.1  HCT 43.1 43.4  MCV 87.6 91.0  PLT 277 305   Lipid Panel: Recent Labs    11/14/23 1005  CHOL 174  HDL 47  LDLCALC 96  TRIG 212*  CHOLHDL 3.7   TSH: No results for input(s): TSH in the last 8760 hours. A1C: No results found for: HGBA1C   Assessment/Plan  Long-term (current) use of anticoagulants, INR goal 2.5-3.5 -     POCT INR   Assessment and Plan Assessment & Plan Aortic valve replacement with chronic anticoagulation INR elevated at 4.6 due to recent accidental doubling of Coumadin  dose. No bleeding or bruising observed. - Decrease Coumadin  to 1.5 mg daily for three days. - Resume  Coumadin  3.5 mg daily after three days. - Scheduled INR check on December 29th-- LAB only - Instructed to report any signs of bleeding or bruising.  Essential hypertension Hypertension well-controlled. - Check kidney and liver function with complete metabolic panel with labs  Iron  deficiency anemia Well-managed with current iron  supplementation. - Check blood counts during next lab visit.  Mixed hyperlipidemia Managed with lovastatin . - Check cholesterol levels with labs   INR check in 10 day via lab and  will check INR during visit in 1 month   Jomarion Mish K. Caro BODILY [facility] [phone]     [1]  Allergies Allergen Reactions   Aspirin     Other reaction(s): Other (See Comments) Can't take due to taking blood thinners   Morphine And Codeine Other (See Comments)    Other reaction(s): Other (See Comments) Hallucinations     Nitroglycerin      Severe headache   Promethazine Other (See Comments)    Other reaction(s): Other (See Comments) Hallucinations    Promethazine Hcl Other (See Comments)   "

## 2024-10-29 ENCOUNTER — Other Ambulatory Visit

## 2024-10-30 ENCOUNTER — Ambulatory Visit: Payer: Self-pay | Admitting: Nurse Practitioner

## 2024-10-30 LAB — COMPREHENSIVE METABOLIC PANEL WITH GFR
AG Ratio: 1.6 (calc) (ref 1.0–2.5)
ALT: 13 U/L (ref 9–46)
AST: 15 U/L (ref 10–35)
Albumin: 3.7 g/dL (ref 3.6–5.1)
Alkaline phosphatase (APISO): 89 U/L (ref 35–144)
BUN/Creatinine Ratio: 14 (calc) (ref 6–22)
BUN: 19 mg/dL (ref 7–25)
CO2: 29 mmol/L (ref 20–32)
Calcium: 9.3 mg/dL (ref 8.6–10.3)
Chloride: 103 mmol/L (ref 98–110)
Creat: 1.33 mg/dL — ABNORMAL HIGH (ref 0.70–1.28)
Globulin: 2.3 g/dL (ref 1.9–3.7)
Glucose, Bld: 88 mg/dL (ref 65–139)
Potassium: 4.8 mmol/L (ref 3.5–5.3)
Sodium: 138 mmol/L (ref 135–146)
Total Bilirubin: 0.3 mg/dL (ref 0.2–1.2)
Total Protein: 6 g/dL — ABNORMAL LOW (ref 6.1–8.1)
eGFR: 55 mL/min/1.73m2 — ABNORMAL LOW

## 2024-10-30 LAB — CBC WITH DIFFERENTIAL/PLATELET
Absolute Lymphocytes: 1071 {cells}/uL (ref 850–3900)
Absolute Monocytes: 855 {cells}/uL (ref 200–950)
Basophils Absolute: 81 {cells}/uL (ref 0–200)
Basophils Relative: 0.9 %
Eosinophils Absolute: 396 {cells}/uL (ref 15–500)
Eosinophils Relative: 4.4 %
HCT: 40.9 % (ref 39.4–51.1)
Hemoglobin: 13.4 g/dL (ref 13.2–17.1)
MCH: 29.1 pg (ref 27.0–33.0)
MCHC: 32.8 g/dL (ref 31.6–35.4)
MCV: 88.7 fL (ref 81.4–101.7)
MPV: 10.6 fL (ref 7.5–12.5)
Monocytes Relative: 9.5 %
Neutro Abs: 6597 {cells}/uL (ref 1500–7800)
Neutrophils Relative %: 73.3 %
Platelets: 295 Thousand/uL (ref 140–400)
RBC: 4.61 Million/uL (ref 4.20–5.80)
RDW: 12.4 % (ref 11.0–15.0)
Total Lymphocyte: 11.9 %
WBC: 9 Thousand/uL (ref 3.8–10.8)

## 2024-10-30 LAB — LIPID PANEL
Cholesterol: 175 mg/dL
HDL: 53 mg/dL
LDL Cholesterol (Calc): 92 mg/dL
Non-HDL Cholesterol (Calc): 122 mg/dL
Total CHOL/HDL Ratio: 3.3 (calc)
Triglycerides: 210 mg/dL — ABNORMAL HIGH

## 2024-10-30 LAB — PROTIME-INR
INR: 3.8 — ABNORMAL HIGH
Prothrombin Time: 37.4 s — ABNORMAL HIGH (ref 9.0–11.5)

## 2024-10-30 NOTE — Telephone Encounter (Signed)
 Copied from CRM #[id number]. Topic: Clinical - Lab/Test Results >> Oct 30, 2024 12:01 PM Fredrica W wrote: Reason for CRM: Patient returned missed call for labs. Read note as written by provider. Patient understood. No Further questions at this time. Thank You.

## 2024-11-19 ENCOUNTER — Encounter: Payer: Self-pay | Admitting: Nurse Practitioner

## 2024-11-19 ENCOUNTER — Ambulatory Visit: Admitting: Nurse Practitioner

## 2024-11-19 VITALS — BP 132/88 | HR 70 | Temp 96.7°F | Resp 18 | Ht 68.0 in | Wt 184.2 lb

## 2024-11-19 DIAGNOSIS — E782 Mixed hyperlipidemia: Secondary | ICD-10-CM

## 2024-11-19 DIAGNOSIS — R42 Dizziness and giddiness: Secondary | ICD-10-CM

## 2024-11-19 DIAGNOSIS — Z7901 Long term (current) use of anticoagulants: Secondary | ICD-10-CM

## 2024-11-19 DIAGNOSIS — Z952 Presence of prosthetic heart valve: Secondary | ICD-10-CM

## 2024-11-19 DIAGNOSIS — D509 Iron deficiency anemia, unspecified: Secondary | ICD-10-CM | POA: Diagnosis not present

## 2024-11-19 LAB — POCT INR: INR: 3.7 — AB (ref 2.0–3.0)

## 2024-11-19 NOTE — Patient Instructions (Signed)
 Continue coumadin  3.5 mg daily

## 2024-11-19 NOTE — Progress Notes (Signed)
 "  Location[facility]  [facility]  Provider: Caro Harlene POUR, NP  Goals of Care:     01/06/2024   10:55 AM  Advanced Directives  Does Patient Have a Medical Advance Directive? Yes  Type of Estate Agent of [place];Out of facility DNR (pink MOST or yellow form);Living will  Does patient want to make changes to medical advance directive? No - Patient declined  Copy of Healthcare Power of Attorney in Chart? Yes - validated most recent copy scanned in chart (See row information)   Chief Complaint  Patient presents with   Coagulation Disorder    INR check     INR/Prothrombin Time Check   HPI: Patient is a 79 y.o. male seen today for PT/INR check  Pt came in for INR check; pt denies missed doses, denies signs of bleeding, denies dark stools, denies discolored urine.  INR today is 3.7 he is taking coumadin  3.5 mg daily for the last 3 week- prior INR was 3.8.   Pt states he has felt lightheaded yesterday and today- felt like sleep was a contributor.  No chest pains, shortness of breath, palpitations   pt states generalized weakness, and having difficulty with sustained activity since he has not been exercises routinely due [place] [facility]  Reports pain in lower back limiting physical activity; endorses improvement in symptoms over time.  Pt denies N/V/D; denies any acute symptoms presently.   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   Right Alar Crease   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   Right Temple (in situ) (curet and 5FU)    Past Surgical History:  Procedure Laterality Date   AORTIC VALVE REPLACEMENT     mechanical   BIOPSY  03/28/2021   Procedure: BIOPSY;  Surgeon: Donnald Charleston, MD;  Location: [facility];  Service: Endoscopy;;   BLADDER SURGERY  1983   Bladder Cancer Surgery    CATARACT EXTRACTION Right 06/30/2023   CATARACT EXTRACTION Left 06/16/2023   ESOPHAGOGASTRODUODENOSCOPY (EGD) WITH PROPOFOL  N/A 03/28/2021   Procedure: ESOPHAGOGASTRODUODENOSCOPY (EGD) WITH PROPOFOL ;  Surgeon: Donnald Charleston, MD;  Location: [facility];  Service: Endoscopy;  Laterality: N/A;   KNEE SURGERY Right 1980   KNEE SURGERY Left 1982   OTHER SURGICAL HISTORY  1992   stomach muscles removed due to Coumadin  caused bleeding.   SKIN LESION EXCISION     Some cancerous   SKIN SURGERY  01/30/2022   Basal Cell on side of nose   TONSILLECTOMY  1965    Allergies[1]  Allergies as of 11/19/2024       Reactions   Aspirin    Other reaction(s): Other (See Comments) Can't take due to taking blood thinners   Morphine And Codeine Other (See Comments)   Other reaction(s): Other (See Comments) Hallucinations  Nitroglycerin     Severe headache   Promethazine Other (See Comments)   Other reaction(s): Other (See Comments) Hallucinations   Promethazine Hcl Other (See Comments)        Medication List        Accurate as of November 19, 2024  3:32 PM. If you have any questions, ask your nurse or doctor.          acetaminophen  500 MG tablet Commonly known as: TYLENOL  Take 500 mg by mouth daily as needed for moderate pain.   albuterol  108 (90 Base) MCG/ACT inhaler Commonly known as: VENTOLIN  HFA Inhale 1 puff into the lungs every 6 (six) hours as needed for wheezing or shortness of breath.   amoxicillin  500 MG  capsule Commonly known as: AMOXIL  Take 1,000 mg by mouth 2 (two) times daily. Prior to dental procedure   benzonatate  100 MG capsule Commonly known as: TESSALON  Take 1 capsule (100 mg total) by mouth 3 (three) times daily as needed for cough.   cetirizine 10 MG tablet Commonly known as: ZYRTEC Take 10 mg by mouth daily.   cyanocobalamin 1000 MCG tablet Commonly known as: VITAMIN B12 Take 1,000 mcg by mouth daily.   docusate sodium 100 MG capsule Commonly known as: COLACE Take 100 mg by mouth as needed for mild constipation.   famotidine  20 MG tablet Commonly known as: PEPCID  Take 20 mg by mouth as needed for heartburn or indigestion.   FeroSul 325 (65 Fe) MG tablet Generic drug: ferrous sulfate  TAKE 1 TABLET BY MOUTH DAILY   fluticasone  50 MCG/ACT nasal spray Commonly known as: FLONASE  SPRAY 2 SPRAYS IN EACH NOSTRIL 2 TIMES A DAY   furosemide  20 MG tablet Commonly known as: LASIX  Take 1 tablet (20 mg total) by mouth daily as needed for fluid or edema (3lb weight gain).   lovastatin  40 MG tablet Commonly known as: MEVACOR  TAKE 1 TABLET BY MOUTH AT BEDTIME   pantoprazole  40 MG tablet Commonly known as: PROTONIX  TAKE 1 TABLET BY MOUTH 2 TIMES A DAY   ramipril  10 MG capsule Commonly known as: ALTACE  TAKE 1 CAPSULE BY MOUTH 2 TIMES A DAY   Systane Balance 0.6 % Soln Generic drug: Propylene Glycol Apply 1 drop to eye as needed (for dry eye).   traZODone  50 MG tablet Commonly known as: DESYREL  TAKE A HALF TABLET BY MOUTH AT BEDTIME   VITAMIN D-3 PO Take 1 tablet by mouth daily. Dose unknown   warfarin 1 MG tablet Commonly known as: COUMADIN  TAKE 1/2 TABLET BY MOUTH DAILY WITH 3MG  TO TOTAL 3.5 MG EXCEPT EVERY 4th WEDNESDAY take 3/4 tablet to total 3.75   warfarin 3 MG tablet Commonly known as: COUMADIN  TAKE 1 TABLET BY MOUTH DAILY ALONG WITH 0.5 MILLIGRAM TABLET FOR TOTAL DOSE OF 3.5 MILLIGRAMS AS DIRECTED BY DOCTOR        Review of Systems:  Review of  Systems  Constitutional:  Positive for fatigue. Negative for activity change, appetite change and unexpected weight change.  HENT: Negative.  Negative for congestion and hearing loss.   Eyes: Negative.   Respiratory: Negative.  Negative for cough and shortness of breath.   Cardiovascular:  Negative for chest pain, palpitations and leg swelling.  Gastrointestinal:  Negative for abdominal pain, blood in stool, constipation and diarrhea.  Genitourinary:  Negative for difficulty urinating, dysuria and hematuria.  Musculoskeletal:  Negative for arthralgias and myalgias.  Skin:  Negative for color change and wound.  Neurological:  Positive for light-headedness. Negative for  dizziness and weakness.  Hematological:  Does not bruise/bleed easily.  Psychiatric/Behavioral: Negative.  Negative for agitation, behavioral problems and confusion.     Health Maintenance  Topic Date Due   Zoster Vaccines- Shingrix (1 of 2) Never done   Medicare Annual Wellness (AWV)  12/05/2024   COVID-19 Vaccine (7 - Pfizer risk 2025-26 season) 01/17/2025   DTaP/Tdap/Td (2 - Td or Tdap) 03/20/2030   Pneumococcal Vaccine: 50+ Years  Completed   Influenza Vaccine  Completed   Hepatitis C Screening  Completed   Meningococcal B Vaccine  Aged Out   Fecal DNA (Cologuard)  Discontinued    Physical Exam: Vitals:   11/19/24 1420  BP: 132/88  Pulse: 70  Resp: 18  Temp: (!) 96.7 F (35.9 C)  SpO2: 99%  Weight: 184 lb 3.2 oz (83.6 kg)  Height: 5' 8 (1.727 m)   Body mass index is 28.01 kg/m. Physical Exam Constitutional:      Appearance: Normal appearance. He is normal weight.  HENT:     Head: Normocephalic.  Cardiovascular:     Rate and Rhythm: Normal rate.     Heart sounds: Murmur heard.  Pulmonary:     Effort: Pulmonary effort is normal.     Breath sounds: Normal breath sounds.  Skin:    Findings: No bruising or erythema.  Neurological:     Mental Status: He is alert and oriented to person, place, and  time. Mental status is at baseline.    Labs reviewed: Basic Metabolic Panel: Recent Labs    05/23/24 1506 10/29/24 1321  NA 137 138  K 4.7 4.8  CL 102 103  CO2 28 29  GLUCOSE 86 88  BUN 16 19  CREATININE 1.18 1.33*  CALCIUM 9.2 9.3   Liver Function Tests: Recent Labs    05/23/24 1506 10/29/24 1321  AST 16 15  ALT 12 13  BILITOT 0.3 0.3  PROT 6.1 6.0*   No results for input(s): LIPASE, AMYLASE in the last 8760 hours. No results for input(s): AMMONIA in the last 8760 hours. CBC: Recent Labs    05/23/24 1506 10/29/24 1321  WBC 9.5 9.0  NEUTROABS 6,669 6,597  HGB 14.1 13.4  HCT 43.4 40.9  MCV 91.0 88.7  PLT 305 295   Lipid Panel: Recent Labs    10/29/24 1321  CHOL 175  HDL 53  LDLCALC 92  TRIG 210*  CHOLHDL 3.3   No results found for: HGBA1C  Procedures since last visit: No results found.  Assessment/Plan 1. Anticoagulated -INR 3.7 on visit; stable from prior- GOAL INR 3-3.5  no signs or symptoms of abnormal bleeding -Continue Coumadin  3.5 mg daily  2. Dizziness Symptoms of dizziness/lightheadedness starting yesterday Otherwise in normal state of health Cr slightly elevated on recent labs- discussed possible dehydration To increase water intake.  To notify for symptoms worsen or more symptoms occur.   3. Iron  deficiency anemia, unspecified iron  deficiency anemia type -well-managed with current iron  supplementation  4. Hyperlipidemia LDL at goal, continues on lovastatin   5. H/O aortic valve replacement -stable, continue on coumadin  for anticoagulation.  Euvolemic. Blood pressure well controlled.     INR in 4 weeks.  Dale Bound, Student-FNP I personally was present during the history, physical exam and medical decision-making activities of this service and have verified that the service and findings are accurately documented in the students note  Harlene K. Caro BODILY  [facility] [phone] 8  am - 5 pm) [phone] (after hours)     [  1]  Allergies Allergen Reactions   Aspirin     Other reaction(s): Other (See Comments) Can't take due to taking blood thinners   Morphine And Codeine Other (See Comments)    Other reaction(s): Other (See Comments) Hallucinations     Nitroglycerin      Severe headache   Promethazine Other (See Comments)    Other reaction(s): Other (See Comments) Hallucinations    Promethazine Hcl Other (See Comments)   "

## 2024-12-11 ENCOUNTER — Encounter: Payer: Commercial Managed Care - PPO | Admitting: Nurse Practitioner

## 2024-12-24 ENCOUNTER — Ambulatory Visit: Admitting: Nurse Practitioner

## 2025-01-07 ENCOUNTER — Ambulatory Visit: Admitting: Nurse Practitioner
